# Patient Record
Sex: Female | Born: 1955 | Race: White | Hispanic: No | Marital: Married | Smoking: Never smoker
Health system: Southern US, Community
[De-identification: ages and names within clinical notes are randomized; demographics above are authoritative.]

## PROBLEM LIST (undated history)

## (undated) DIAGNOSIS — C801 Malignant (primary) neoplasm, unspecified: Secondary | ICD-10-CM

## (undated) DIAGNOSIS — M797 Fibromyalgia: Secondary | ICD-10-CM

## (undated) DIAGNOSIS — R05 Cough: Secondary | ICD-10-CM

## (undated) HISTORY — DX: Cough: R05

## (undated) HISTORY — PX: OTHER SURGICAL HISTORY: SHX169

## (undated) HISTORY — PX: BONE MARROW BIOPSY: SHX1253

## (undated) HISTORY — DX: Malignant (primary) neoplasm, unspecified: C80.1

---

## 1996-09-28 HISTORY — PX: SPINE SURGERY: SHX786

## 1999-04-24 ENCOUNTER — Encounter: Payer: Self-pay | Admitting: Emergency Medicine

## 1999-04-24 ENCOUNTER — Emergency Department (HOSPITAL_COMMUNITY): Admission: EM | Admit: 1999-04-24 | Discharge: 1999-04-24 | Payer: Self-pay | Admitting: Emergency Medicine

## 1999-09-02 ENCOUNTER — Encounter: Payer: Self-pay | Admitting: Obstetrics and Gynecology

## 1999-09-02 ENCOUNTER — Encounter: Admission: RE | Admit: 1999-09-02 | Discharge: 1999-09-02 | Payer: Self-pay | Admitting: Obstetrics and Gynecology

## 2000-04-28 ENCOUNTER — Ambulatory Visit (HOSPITAL_COMMUNITY): Admission: RE | Admit: 2000-04-28 | Discharge: 2000-04-28 | Payer: Self-pay | Admitting: Internal Medicine

## 2000-04-30 ENCOUNTER — Encounter (INDEPENDENT_AMBULATORY_CARE_PROVIDER_SITE_OTHER): Payer: Self-pay | Admitting: Specialist

## 2000-04-30 ENCOUNTER — Inpatient Hospital Stay (HOSPITAL_COMMUNITY): Admission: AD | Admit: 2000-04-30 | Discharge: 2000-05-04 | Payer: Self-pay | Admitting: Neurosurgery

## 2000-05-11 ENCOUNTER — Inpatient Hospital Stay (HOSPITAL_COMMUNITY): Admission: AD | Admit: 2000-05-11 | Discharge: 2000-05-16 | Payer: Self-pay | Admitting: Neurology

## 2005-03-16 ENCOUNTER — Ambulatory Visit (HOSPITAL_COMMUNITY): Admission: RE | Admit: 2005-03-16 | Discharge: 2005-03-16 | Payer: Self-pay | Admitting: Family Medicine

## 2005-03-19 ENCOUNTER — Ambulatory Visit (HOSPITAL_COMMUNITY): Admission: RE | Admit: 2005-03-19 | Discharge: 2005-03-19 | Payer: Self-pay | Admitting: Gastroenterology

## 2006-12-02 ENCOUNTER — Ambulatory Visit (HOSPITAL_BASED_OUTPATIENT_CLINIC_OR_DEPARTMENT_OTHER): Admission: RE | Admit: 2006-12-02 | Discharge: 2006-12-02 | Payer: Self-pay | Admitting: Orthopedic Surgery

## 2006-12-30 ENCOUNTER — Ambulatory Visit (HOSPITAL_BASED_OUTPATIENT_CLINIC_OR_DEPARTMENT_OTHER): Admission: RE | Admit: 2006-12-30 | Discharge: 2006-12-30 | Payer: Self-pay | Admitting: Orthopedic Surgery

## 2007-04-21 ENCOUNTER — Ambulatory Visit (HOSPITAL_COMMUNITY): Admission: RE | Admit: 2007-04-21 | Discharge: 2007-04-21 | Payer: Self-pay | Admitting: Family Medicine

## 2007-04-21 ENCOUNTER — Encounter (INDEPENDENT_AMBULATORY_CARE_PROVIDER_SITE_OTHER): Payer: Self-pay | Admitting: Interventional Radiology

## 2007-04-28 ENCOUNTER — Ambulatory Visit: Payer: Self-pay | Admitting: Hematology & Oncology

## 2007-05-17 ENCOUNTER — Ambulatory Visit (HOSPITAL_COMMUNITY): Admission: RE | Admit: 2007-05-17 | Discharge: 2007-05-17 | Payer: Self-pay | Admitting: Hematology & Oncology

## 2007-05-19 ENCOUNTER — Encounter: Payer: Self-pay | Admitting: Hematology & Oncology

## 2007-05-19 ENCOUNTER — Ambulatory Visit: Admission: RE | Admit: 2007-05-19 | Discharge: 2007-05-19 | Payer: Self-pay | Admitting: Hematology & Oncology

## 2007-05-20 ENCOUNTER — Ambulatory Visit (HOSPITAL_COMMUNITY): Admission: RE | Admit: 2007-05-20 | Discharge: 2007-05-20 | Payer: Self-pay | Admitting: Hematology & Oncology

## 2007-05-23 ENCOUNTER — Ambulatory Visit: Admission: RE | Admit: 2007-05-23 | Discharge: 2007-05-23 | Payer: Self-pay | Admitting: Hematology & Oncology

## 2007-05-23 LAB — CBC WITH DIFFERENTIAL/PLATELET
BASO%: 0.5 % (ref 0.0–2.0)
Eosinophils Absolute: 0.6 10*3/uL — ABNORMAL HIGH (ref 0.0–0.5)
MCHC: 33 g/dL (ref 32.0–36.0)
MONO#: 1.1 10*3/uL — ABNORMAL HIGH (ref 0.1–0.9)
MONO%: 10.6 % (ref 0.0–13.0)
NEUT#: 6.9 10*3/uL — ABNORMAL HIGH (ref 1.5–6.5)
RBC: 4.58 10*6/uL (ref 3.70–5.32)
RDW: 17.5 % — ABNORMAL HIGH (ref 11.3–14.5)
WBC: 10.8 10*3/uL — ABNORMAL HIGH (ref 3.9–10.0)

## 2007-05-23 LAB — COMPREHENSIVE METABOLIC PANEL
ALT: 11 U/L (ref 0–35)
Albumin: 3.3 g/dL — ABNORMAL LOW (ref 3.5–5.2)
Alkaline Phosphatase: 97 U/L (ref 39–117)
CO2: 24 mEq/L (ref 19–32)
Glucose, Bld: 89 mg/dL (ref 70–99)
Potassium: 4.6 mEq/L (ref 3.5–5.3)
Sodium: 140 mEq/L (ref 135–145)
Total Bilirubin: 0.3 mg/dL (ref 0.3–1.2)
Total Protein: 7.1 g/dL (ref 6.0–8.3)

## 2007-06-06 LAB — COMPREHENSIVE METABOLIC PANEL
ALT: 20 U/L (ref 0–35)
AST: 18 U/L (ref 0–37)
Albumin: 3.9 g/dL (ref 3.5–5.2)
Alkaline Phosphatase: 95 U/L (ref 39–117)
Potassium: 4.1 mEq/L (ref 3.5–5.3)
Sodium: 140 mEq/L (ref 135–145)
Total Bilirubin: 0.4 mg/dL (ref 0.3–1.2)
Total Protein: 7.2 g/dL (ref 6.0–8.3)

## 2007-06-06 LAB — CBC WITH DIFFERENTIAL/PLATELET
BASO%: 1.2 % (ref 0.0–2.0)
EOS%: 7.4 % — ABNORMAL HIGH (ref 0.0–7.0)
Eosinophils Absolute: 0.2 10*3/uL (ref 0.0–0.5)
LYMPH%: 59.1 % — ABNORMAL HIGH (ref 14.0–48.0)
MCH: 25.8 pg — ABNORMAL LOW (ref 26.0–34.0)
MCHC: 33.6 g/dL (ref 32.0–36.0)
MCV: 76.6 fL — ABNORMAL LOW (ref 81.0–101.0)
MONO%: 20.2 % — ABNORMAL HIGH (ref 0.0–13.0)
NEUT#: 0.4 10*3/uL — CL (ref 1.5–6.5)
Platelets: 391 10*3/uL (ref 145–400)
RBC: 4.58 10*6/uL (ref 3.70–5.32)
RDW: 19 % — ABNORMAL HIGH (ref 11.3–14.5)

## 2007-06-16 ENCOUNTER — Ambulatory Visit: Payer: Self-pay | Admitting: Hematology & Oncology

## 2007-06-20 LAB — CBC WITH DIFFERENTIAL/PLATELET
EOS%: 1.4 % (ref 0.0–7.0)
Eosinophils Absolute: 0.1 10*3/uL (ref 0.0–0.5)
LYMPH%: 22.2 % (ref 14.0–48.0)
MCH: 26.6 pg (ref 26.0–34.0)
MCV: 79 fL — ABNORMAL LOW (ref 81.0–101.0)
MONO%: 9.9 % (ref 0.0–13.0)
NEUT#: 6.1 10*3/uL (ref 1.5–6.5)
Platelets: 263 10*3/uL (ref 145–400)
RBC: 4.46 10*6/uL (ref 3.70–5.32)
RDW: 23.3 % — ABNORMAL HIGH (ref 11.3–14.5)

## 2007-06-20 LAB — COMPREHENSIVE METABOLIC PANEL
ALT: 17 U/L (ref 0–35)
Alkaline Phosphatase: 105 U/L (ref 39–117)
CO2: 24 mEq/L (ref 19–32)
Creatinine, Ser: 0.72 mg/dL (ref 0.40–1.20)
Glucose, Bld: 63 mg/dL — ABNORMAL LOW (ref 70–99)
Sodium: 142 mEq/L (ref 135–145)
Total Bilirubin: 0.3 mg/dL (ref 0.3–1.2)
Total Protein: 7.2 g/dL (ref 6.0–8.3)

## 2007-07-04 LAB — CBC WITH DIFFERENTIAL/PLATELET
Eosinophils Absolute: 0.2 10*3/uL (ref 0.0–0.5)
HCT: 34.9 % (ref 34.8–46.6)
LYMPH%: 17.3 % (ref 14.0–48.0)
MCV: 80.3 fL — ABNORMAL LOW (ref 81.0–101.0)
MONO#: 1 10*3/uL — ABNORMAL HIGH (ref 0.1–0.9)
MONO%: 9.6 % (ref 0.0–13.0)
NEUT#: 7.7 10*3/uL — ABNORMAL HIGH (ref 1.5–6.5)
NEUT%: 71 % (ref 39.6–76.8)
Platelets: 310 10*3/uL (ref 145–400)
RBC: 4.35 10*6/uL (ref 3.70–5.32)
WBC: 10.8 10*3/uL — ABNORMAL HIGH (ref 3.9–10.0)

## 2007-07-04 LAB — LACTATE DEHYDROGENASE: LDH: 203 U/L (ref 94–250)

## 2007-07-04 LAB — COMPREHENSIVE METABOLIC PANEL
Alkaline Phosphatase: 117 U/L (ref 39–117)
BUN: 16 mg/dL (ref 6–23)
CO2: 23 mEq/L (ref 19–32)
Creatinine, Ser: 0.76 mg/dL (ref 0.40–1.20)
Glucose, Bld: 83 mg/dL (ref 70–99)
Total Bilirubin: 0.3 mg/dL (ref 0.3–1.2)

## 2007-07-18 LAB — CBC WITH DIFFERENTIAL/PLATELET
BASO%: 0.7 % (ref 0.0–2.0)
Basophils Absolute: 0.1 10*3/uL (ref 0.0–0.1)
Eosinophils Absolute: 0.2 10*3/uL (ref 0.0–0.5)
HCT: 35.7 % (ref 34.8–46.6)
HGB: 11.9 g/dL (ref 11.6–15.9)
MCHC: 33.5 g/dL (ref 32.0–36.0)
MONO#: 1.3 10*3/uL — ABNORMAL HIGH (ref 0.1–0.9)
NEUT#: 9.2 10*3/uL — ABNORMAL HIGH (ref 1.5–6.5)
NEUT%: 72.1 % (ref 39.6–76.8)
Platelets: 334 10*3/uL (ref 145–400)
WBC: 12.7 10*3/uL — ABNORMAL HIGH (ref 3.9–10.0)
lymph#: 2 10*3/uL (ref 0.9–3.3)

## 2007-07-18 LAB — COMPREHENSIVE METABOLIC PANEL
Alkaline Phosphatase: 115 U/L (ref 39–117)
BUN: 20 mg/dL (ref 6–23)
CO2: 22 mEq/L (ref 19–32)
Creatinine, Ser: 0.75 mg/dL (ref 0.40–1.20)
Glucose, Bld: 74 mg/dL (ref 70–99)
Total Bilirubin: 0.3 mg/dL (ref 0.3–1.2)

## 2007-07-28 ENCOUNTER — Ambulatory Visit: Payer: Self-pay | Admitting: Hematology & Oncology

## 2007-08-01 LAB — CBC WITH DIFFERENTIAL/PLATELET
Basophils Absolute: 0.1 10*3/uL (ref 0.0–0.1)
Eosinophils Absolute: 0.1 10*3/uL (ref 0.0–0.5)
HGB: 11.8 g/dL (ref 11.6–15.9)
LYMPH%: 18.4 % (ref 14.0–48.0)
MCV: 85.9 fL (ref 81.0–101.0)
MONO#: 1.2 10*3/uL — ABNORMAL HIGH (ref 0.1–0.9)
MONO%: 11.7 % (ref 0.0–13.0)
NEUT#: 6.9 10*3/uL — ABNORMAL HIGH (ref 1.5–6.5)
Platelets: 312 10*3/uL (ref 145–400)
RBC: 4 10*6/uL (ref 3.70–5.32)
WBC: 10.2 10*3/uL — ABNORMAL HIGH (ref 3.9–10.0)

## 2007-08-15 LAB — COMPREHENSIVE METABOLIC PANEL
Alkaline Phosphatase: 122 U/L — ABNORMAL HIGH (ref 39–117)
BUN: 21 mg/dL (ref 6–23)
CO2: 22 mEq/L (ref 19–32)
Creatinine, Ser: 0.74 mg/dL (ref 0.40–1.20)
Glucose, Bld: 82 mg/dL (ref 70–99)
Total Bilirubin: 0.3 mg/dL (ref 0.3–1.2)

## 2007-08-15 LAB — CBC WITH DIFFERENTIAL/PLATELET
Eosinophils Absolute: 0.2 10*3/uL (ref 0.0–0.5)
HCT: 35.8 % (ref 34.8–46.6)
LYMPH%: 17.1 % (ref 14.0–48.0)
MCHC: 34.5 g/dL (ref 32.0–36.0)
MCV: 89.5 fL (ref 81.0–101.0)
MONO#: 1.2 10*3/uL — ABNORMAL HIGH (ref 0.1–0.9)
MONO%: 9.9 % (ref 0.0–13.0)
NEUT#: 9 10*3/uL — ABNORMAL HIGH (ref 1.5–6.5)
NEUT%: 71.3 % (ref 39.6–76.8)
Platelets: 339 10*3/uL (ref 145–400)
RBC: 3.99 10*6/uL (ref 3.70–5.32)
WBC: 12.6 10*3/uL — ABNORMAL HIGH (ref 3.9–10.0)

## 2007-08-15 LAB — LACTATE DEHYDROGENASE: LDH: 198 U/L (ref 94–250)

## 2007-08-29 LAB — COMPREHENSIVE METABOLIC PANEL
ALT: 18 U/L (ref 0–35)
AST: 13 U/L (ref 0–37)
Chloride: 108 mEq/L (ref 96–112)
Creatinine, Ser: 0.89 mg/dL (ref 0.40–1.20)
Total Bilirubin: 0.3 mg/dL (ref 0.3–1.2)

## 2007-08-29 LAB — CBC WITH DIFFERENTIAL/PLATELET
BASO%: 0.3 % (ref 0.0–2.0)
Basophils Absolute: 0.1 10*3/uL (ref 0.0–0.1)
Eosinophils Absolute: 0.1 10*3/uL (ref 0.0–0.5)
HCT: 35.7 % (ref 34.8–46.6)
HGB: 12.4 g/dL (ref 11.6–15.9)
MCHC: 34.7 g/dL (ref 32.0–36.0)
MONO#: 1.1 10*3/uL — ABNORMAL HIGH (ref 0.1–0.9)
NEUT#: 14.4 10*3/uL — ABNORMAL HIGH (ref 1.5–6.5)
NEUT%: 81.4 % — ABNORMAL HIGH (ref 39.6–76.8)
Platelets: 302 10*3/uL (ref 145–400)
WBC: 17.7 10*3/uL — ABNORMAL HIGH (ref 3.9–10.0)
lymph#: 2.1 10*3/uL (ref 0.9–3.3)

## 2007-09-02 ENCOUNTER — Ambulatory Visit (HOSPITAL_COMMUNITY): Admission: RE | Admit: 2007-09-02 | Discharge: 2007-09-02 | Payer: Self-pay | Admitting: Hematology & Oncology

## 2007-09-06 ENCOUNTER — Ambulatory Visit: Payer: Self-pay | Admitting: Hematology & Oncology

## 2007-09-12 LAB — PROTIME-INR: Protime: 21.6 Seconds — ABNORMAL HIGH (ref 10.6–13.4)

## 2007-09-12 LAB — CBC WITH DIFFERENTIAL/PLATELET
Eosinophils Absolute: 0.1 10*3/uL (ref 0.0–0.5)
HCT: 30.5 % — ABNORMAL LOW (ref 34.8–46.6)
HGB: 10.7 g/dL — ABNORMAL LOW (ref 11.6–15.9)
LYMPH%: 36.4 % (ref 14.0–48.0)
MONO#: 1 10*3/uL — ABNORMAL HIGH (ref 0.1–0.9)
NEUT#: 0.9 10*3/uL — ABNORMAL LOW (ref 1.5–6.5)
NEUT%: 27.4 % — ABNORMAL LOW (ref 39.6–76.8)
Platelets: 463 10*3/uL — ABNORMAL HIGH (ref 145–400)
WBC: 3.3 10*3/uL — ABNORMAL LOW (ref 3.9–10.0)

## 2007-09-12 LAB — COMPREHENSIVE METABOLIC PANEL
Albumin: 3.9 g/dL (ref 3.5–5.2)
Alkaline Phosphatase: 83 U/L (ref 39–117)
CO2: 24 mEq/L (ref 19–32)
Glucose, Bld: 93 mg/dL (ref 70–99)
Potassium: 4.4 mEq/L (ref 3.5–5.3)
Sodium: 140 mEq/L (ref 135–145)
Total Protein: 6.9 g/dL (ref 6.0–8.3)

## 2007-09-12 LAB — LACTATE DEHYDROGENASE: LDH: 186 U/L (ref 94–250)

## 2007-09-19 LAB — PROTIME-INR: INR: 3.9 — ABNORMAL HIGH (ref 2.00–3.50)

## 2007-09-26 LAB — CBC WITH DIFFERENTIAL/PLATELET
BASO%: 0.6 % (ref 0.0–2.0)
EOS%: 2.2 % (ref 0.0–7.0)
HCT: 33.5 % — ABNORMAL LOW (ref 34.8–46.6)
LYMPH%: 17 % (ref 14.0–48.0)
MCH: 31.1 pg (ref 26.0–34.0)
MCHC: 34 g/dL (ref 32.0–36.0)
MCV: 91.7 fL (ref 81.0–101.0)
MONO#: 1.1 10*3/uL — ABNORMAL HIGH (ref 0.1–0.9)
MONO%: 12.4 % (ref 0.0–13.0)
NEUT%: 67.8 % (ref 39.6–76.8)
Platelets: 292 10*3/uL (ref 145–400)
RBC: 3.65 10*6/uL — ABNORMAL LOW (ref 3.70–5.32)
WBC: 8.9 10*3/uL (ref 3.9–10.0)

## 2007-09-26 LAB — PROTIME-INR: Protime: 15.6 Seconds — ABNORMAL HIGH (ref 10.6–13.4)

## 2007-10-03 ENCOUNTER — Ambulatory Visit (HOSPITAL_COMMUNITY): Admission: RE | Admit: 2007-10-03 | Discharge: 2007-10-03 | Payer: Self-pay | Admitting: Hematology & Oncology

## 2007-10-03 LAB — PROTIME-INR: Protime: 15.6 Seconds — ABNORMAL HIGH (ref 10.6–13.4)

## 2007-10-10 LAB — COMPREHENSIVE METABOLIC PANEL
ALT: 13 U/L (ref 0–35)
CO2: 21 mEq/L (ref 19–32)
Calcium: 9.5 mg/dL (ref 8.4–10.5)
Chloride: 108 mEq/L (ref 96–112)
Creatinine, Ser: 0.89 mg/dL (ref 0.40–1.20)
Glucose, Bld: 108 mg/dL — ABNORMAL HIGH (ref 70–99)
Total Bilirubin: 0.3 mg/dL (ref 0.3–1.2)
Total Protein: 7.2 g/dL (ref 6.0–8.3)

## 2007-10-10 LAB — CBC WITH DIFFERENTIAL/PLATELET
BASO%: 2.1 % — ABNORMAL HIGH (ref 0.0–2.0)
HCT: 35.2 % (ref 34.8–46.6)
LYMPH%: 41.1 % (ref 14.0–48.0)
MCHC: 34.1 g/dL (ref 32.0–36.0)
MCV: 92.5 fL (ref 81.0–101.0)
MONO#: 1.1 10*3/uL — ABNORMAL HIGH (ref 0.1–0.9)
MONO%: 32.8 % — ABNORMAL HIGH (ref 0.0–13.0)
NEUT%: 18.5 % — ABNORMAL LOW (ref 39.6–76.8)
Platelets: 509 10*3/uL — ABNORMAL HIGH (ref 145–400)
RBC: 3.8 10*6/uL (ref 3.70–5.32)

## 2007-10-10 LAB — PROTIME-INR

## 2007-10-14 ENCOUNTER — Ambulatory Visit: Admission: RE | Admit: 2007-10-14 | Discharge: 2007-10-14 | Payer: Self-pay | Admitting: Hematology & Oncology

## 2007-10-17 LAB — CBC WITH DIFFERENTIAL/PLATELET
BASO%: 0.5 % (ref 0.0–2.0)
Eosinophils Absolute: 0.4 10*3/uL (ref 0.0–0.5)
MCHC: 33.3 g/dL (ref 32.0–36.0)
MONO#: 1.9 10*3/uL — ABNORMAL HIGH (ref 0.1–0.9)
NEUT#: 8.7 10*3/uL — ABNORMAL HIGH (ref 1.5–6.5)
RBC: 4.2 10*6/uL (ref 3.70–5.32)
WBC: 13.1 10*3/uL — ABNORMAL HIGH (ref 3.9–10.0)
lymph#: 2 10*3/uL (ref 0.9–3.3)

## 2007-10-17 LAB — PROTIME-INR
INR: 1.9 — ABNORMAL LOW (ref 2.00–3.50)
Protime: 22.8 Seconds — ABNORMAL HIGH (ref 10.6–13.4)

## 2007-10-17 LAB — COMPREHENSIVE METABOLIC PANEL
ALT: 17 U/L (ref 0–35)
AST: 15 U/L (ref 0–37)
Alkaline Phosphatase: 82 U/L (ref 39–117)
Potassium: 4.5 mEq/L (ref 3.5–5.3)
Sodium: 140 mEq/L (ref 135–145)
Total Bilirubin: 0.4 mg/dL (ref 0.3–1.2)
Total Protein: 7.2 g/dL (ref 6.0–8.3)

## 2007-10-24 ENCOUNTER — Ambulatory Visit: Payer: Self-pay | Admitting: Hematology & Oncology

## 2007-10-24 LAB — PROTIME-INR: INR: 3.9 — ABNORMAL HIGH (ref 2.00–3.50)

## 2007-10-31 LAB — CBC WITH DIFFERENTIAL/PLATELET
BASO%: 2.1 % — ABNORMAL HIGH (ref 0.0–2.0)
EOS%: 16.4 % — ABNORMAL HIGH (ref 0.0–7.0)
HCT: 33.1 % — ABNORMAL LOW (ref 34.8–46.6)
LYMPH%: 40.1 % (ref 14.0–48.0)
MCH: 30.2 pg (ref 26.0–34.0)
MCHC: 33.7 g/dL (ref 32.0–36.0)
MCV: 89.5 fL (ref 81.0–101.0)
MONO%: 22.9 % — ABNORMAL HIGH (ref 0.0–13.0)
NEUT%: 18.5 % — ABNORMAL LOW (ref 39.6–76.8)
Platelets: 304 10*3/uL (ref 145–400)
RBC: 3.7 10*6/uL (ref 3.70–5.32)

## 2007-10-31 LAB — COMPREHENSIVE METABOLIC PANEL
ALT: 13 U/L (ref 0–35)
AST: 13 U/L (ref 0–37)
Alkaline Phosphatase: 80 U/L (ref 39–117)
CO2: 23 mEq/L (ref 19–32)
Creatinine, Ser: 0.73 mg/dL (ref 0.40–1.20)
Sodium: 143 mEq/L (ref 135–145)
Total Bilirubin: 0.3 mg/dL (ref 0.3–1.2)

## 2007-11-15 ENCOUNTER — Ambulatory Visit (HOSPITAL_COMMUNITY): Admission: RE | Admit: 2007-11-15 | Discharge: 2007-11-15 | Payer: Self-pay | Admitting: Hematology & Oncology

## 2007-11-21 LAB — COMPREHENSIVE METABOLIC PANEL
Albumin: 4.2 g/dL (ref 3.5–5.2)
CO2: 23 mEq/L (ref 19–32)
Glucose, Bld: 95 mg/dL (ref 70–99)
Potassium: 4 mEq/L (ref 3.5–5.3)
Sodium: 138 mEq/L (ref 135–145)
Total Protein: 7.3 g/dL (ref 6.0–8.3)

## 2007-11-21 LAB — CBC WITH DIFFERENTIAL/PLATELET
BASO%: 0.4 % (ref 0.0–2.0)
HCT: 35.1 % (ref 34.8–46.6)
LYMPH%: 21.1 % (ref 14.0–48.0)
MCHC: 34.3 g/dL (ref 32.0–36.0)
MCV: 87.2 fL (ref 81.0–101.0)
MONO#: 1.8 10*3/uL — ABNORMAL HIGH (ref 0.1–0.9)
MONO%: 17 % — ABNORMAL HIGH (ref 0.0–13.0)
NEUT%: 59 % (ref 39.6–76.8)
Platelets: 418 10*3/uL — ABNORMAL HIGH (ref 145–400)
WBC: 10.4 10*3/uL — ABNORMAL HIGH (ref 3.9–10.0)

## 2007-11-25 ENCOUNTER — Ambulatory Visit: Admission: RE | Admit: 2007-11-25 | Discharge: 2008-01-02 | Payer: Self-pay | Admitting: Radiation Oncology

## 2007-12-05 LAB — PROTIME-INR

## 2007-12-19 ENCOUNTER — Ambulatory Visit: Payer: Self-pay | Admitting: Hematology & Oncology

## 2007-12-22 ENCOUNTER — Ambulatory Visit: Payer: Self-pay | Admitting: Oncology

## 2007-12-22 ENCOUNTER — Ambulatory Visit (HOSPITAL_COMMUNITY): Admission: RE | Admit: 2007-12-22 | Discharge: 2007-12-22 | Payer: Self-pay | Admitting: Radiation Oncology

## 2007-12-22 ENCOUNTER — Inpatient Hospital Stay (HOSPITAL_COMMUNITY): Admission: AD | Admit: 2007-12-22 | Discharge: 2007-12-27 | Payer: Self-pay | Admitting: Hematology & Oncology

## 2007-12-30 LAB — CBC WITH DIFFERENTIAL/PLATELET
BASO%: 0 % (ref 0.0–2.0)
Eosinophils Absolute: 0 10*3/uL (ref 0.0–0.5)
HCT: 33.8 % — ABNORMAL LOW (ref 34.8–46.6)
HGB: 11.6 g/dL (ref 11.6–15.9)
MCHC: 34.4 g/dL (ref 32.0–36.0)
MONO#: 0.1 10*3/uL (ref 0.1–0.9)
NEUT#: 8.1 10*3/uL — ABNORMAL HIGH (ref 1.5–6.5)
NEUT%: 95.5 % — ABNORMAL HIGH (ref 39.6–76.8)
WBC: 8.5 10*3/uL (ref 3.9–10.0)
lymph#: 0.2 10*3/uL — ABNORMAL LOW (ref 0.9–3.3)

## 2007-12-30 LAB — COMPREHENSIVE METABOLIC PANEL
ALT: 14 U/L (ref 0–35)
CO2: 24 mEq/L (ref 19–32)
Calcium: 9 mg/dL (ref 8.4–10.5)
Chloride: 104 mEq/L (ref 96–112)
Creatinine, Ser: 0.72 mg/dL (ref 0.40–1.20)
Total Protein: 6.8 g/dL (ref 6.0–8.3)

## 2007-12-30 LAB — LACTATE DEHYDROGENASE: LDH: 186 U/L (ref 94–250)

## 2008-02-29 ENCOUNTER — Ambulatory Visit (HOSPITAL_COMMUNITY): Admission: RE | Admit: 2008-02-29 | Discharge: 2008-02-29 | Payer: Self-pay | Admitting: Hematology & Oncology

## 2008-03-05 ENCOUNTER — Ambulatory Visit: Payer: Self-pay | Admitting: Hematology & Oncology

## 2008-03-07 LAB — COMPREHENSIVE METABOLIC PANEL
AST: 15 U/L (ref 0–37)
Albumin: 4.1 g/dL (ref 3.5–5.2)
Alkaline Phosphatase: 81 U/L (ref 39–117)
BUN: 17 mg/dL (ref 6–23)
Glucose, Bld: 93 mg/dL (ref 70–99)
Potassium: 4.8 mEq/L (ref 3.5–5.3)
Sodium: 140 mEq/L (ref 135–145)
Total Bilirubin: 0.4 mg/dL (ref 0.3–1.2)

## 2008-03-07 LAB — CBC WITH DIFFERENTIAL/PLATELET
EOS%: 2.9 % (ref 0.0–7.0)
Eosinophils Absolute: 0.2 10*3/uL (ref 0.0–0.5)
LYMPH%: 14.7 % (ref 14.0–48.0)
MCH: 29.6 pg (ref 26.0–34.0)
MCV: 86.7 fL (ref 81.0–101.0)
MONO%: 8.8 % (ref 0.0–13.0)
NEUT#: 5.1 10*3/uL (ref 1.5–6.5)
Platelets: 313 10*3/uL (ref 145–400)
RBC: 4.75 10*6/uL (ref 3.70–5.32)
RDW: 16.1 % — ABNORMAL HIGH (ref 11.3–14.5)

## 2008-03-07 LAB — LACTATE DEHYDROGENASE: LDH: 165 U/L (ref 94–250)

## 2008-05-02 ENCOUNTER — Ambulatory Visit (HOSPITAL_COMMUNITY): Admission: RE | Admit: 2008-05-02 | Discharge: 2008-05-02 | Payer: Self-pay | Admitting: Hematology & Oncology

## 2008-05-09 ENCOUNTER — Ambulatory Visit: Payer: Self-pay | Admitting: Hematology & Oncology

## 2008-06-21 ENCOUNTER — Ambulatory Visit: Payer: Self-pay | Admitting: Hematology & Oncology

## 2008-07-31 ENCOUNTER — Ambulatory Visit (HOSPITAL_COMMUNITY): Admission: RE | Admit: 2008-07-31 | Discharge: 2008-07-31 | Payer: Self-pay | Admitting: Hematology & Oncology

## 2008-08-07 ENCOUNTER — Ambulatory Visit: Payer: Self-pay | Admitting: Hematology & Oncology

## 2008-08-08 LAB — CBC WITH DIFFERENTIAL (CANCER CENTER ONLY)
Eosinophils Absolute: 0.3 10*3/uL (ref 0.0–0.5)
HCT: 38.3 % (ref 34.8–46.6)
LYMPH%: 21.7 % (ref 14.0–48.0)
MCH: 29 pg (ref 26.0–34.0)
MCV: 87 fL (ref 81–101)
MONO#: 0.6 10*3/uL (ref 0.1–0.9)
NEUT%: 62.8 % (ref 39.6–80.0)
RDW: 12.9 % (ref 10.5–14.6)
WBC: 6.3 10*3/uL (ref 3.9–10.0)

## 2008-08-08 LAB — COMPREHENSIVE METABOLIC PANEL
ALT: 13 U/L (ref 0–35)
AST: 13 U/L (ref 0–37)
Albumin: 3.9 g/dL (ref 3.5–5.2)
Alkaline Phosphatase: 113 U/L (ref 39–117)
Calcium: 9.3 mg/dL (ref 8.4–10.5)
Chloride: 106 mEq/L (ref 96–112)
Potassium: 4.6 mEq/L (ref 3.5–5.3)
Sodium: 142 mEq/L (ref 135–145)
Total Protein: 7.4 g/dL (ref 6.0–8.3)

## 2008-08-13 ENCOUNTER — Ambulatory Visit: Payer: Self-pay | Admitting: Thoracic Surgery (Cardiothoracic Vascular Surgery)

## 2008-09-04 ENCOUNTER — Ambulatory Visit (HOSPITAL_COMMUNITY)
Admission: RE | Admit: 2008-09-04 | Discharge: 2008-09-04 | Payer: Self-pay | Admitting: Thoracic Surgery (Cardiothoracic Vascular Surgery)

## 2008-09-04 ENCOUNTER — Ambulatory Visit: Payer: Self-pay | Admitting: Thoracic Surgery (Cardiothoracic Vascular Surgery)

## 2008-09-04 ENCOUNTER — Encounter: Payer: Self-pay | Admitting: Thoracic Surgery (Cardiothoracic Vascular Surgery)

## 2008-09-10 ENCOUNTER — Ambulatory Visit: Payer: Self-pay | Admitting: Thoracic Surgery (Cardiothoracic Vascular Surgery)

## 2008-09-13 LAB — CBC WITH DIFFERENTIAL (CANCER CENTER ONLY)
BASO#: 0 10*3/uL (ref 0.0–0.2)
EOS%: 5.6 % (ref 0.0–7.0)
HGB: 12.1 g/dL (ref 11.6–15.9)
MCH: 29 pg (ref 26.0–34.0)
MCHC: 33.8 g/dL (ref 32.0–36.0)
MONO%: 10.8 % (ref 0.0–13.0)
NEUT#: 2.9 10*3/uL (ref 1.5–6.5)
NEUT%: 57.1 % (ref 39.6–80.0)

## 2008-09-14 LAB — COMPREHENSIVE METABOLIC PANEL
AST: 11 U/L (ref 0–37)
Alkaline Phosphatase: 101 U/L (ref 39–117)
BUN: 23 mg/dL (ref 6–23)
Creatinine, Ser: 0.72 mg/dL (ref 0.40–1.20)
Glucose, Bld: 101 mg/dL — ABNORMAL HIGH (ref 70–99)
Total Bilirubin: 0.3 mg/dL (ref 0.3–1.2)

## 2008-09-14 LAB — LACTATE DEHYDROGENASE: LDH: 197 U/L (ref 94–250)

## 2008-10-16 ENCOUNTER — Ambulatory Visit: Payer: Self-pay | Admitting: Hematology & Oncology

## 2008-10-16 ENCOUNTER — Inpatient Hospital Stay (HOSPITAL_COMMUNITY): Admission: AD | Admit: 2008-10-16 | Discharge: 2008-10-20 | Payer: Self-pay | Admitting: Hematology & Oncology

## 2008-10-20 ENCOUNTER — Ambulatory Visit: Payer: Self-pay | Admitting: Hematology and Oncology

## 2008-10-22 ENCOUNTER — Ambulatory Visit: Payer: Self-pay | Admitting: Hematology & Oncology

## 2008-10-29 LAB — CBC WITH DIFFERENTIAL (CANCER CENTER ONLY)
HCT: 32.1 % — ABNORMAL LOW (ref 34.8–46.6)
HGB: 10.7 g/dL — ABNORMAL LOW (ref 11.6–15.9)
MCV: 84 fL (ref 81–101)
Platelets: 53 10*3/uL — ABNORMAL LOW (ref 145–400)
RBC: 3.83 10*6/uL (ref 3.70–5.32)
WBC: 7.4 10*3/uL (ref 3.9–10.0)

## 2008-10-29 LAB — CMP (CANCER CENTER ONLY)
CO2: 28 mEq/L (ref 18–33)
Calcium: 9.6 mg/dL (ref 8.0–10.3)
Chloride: 102 mEq/L (ref 98–108)
Glucose, Bld: 107 mg/dL (ref 73–118)
Sodium: 145 mEq/L (ref 128–145)
Total Bilirubin: 0.4 mg/dl (ref 0.20–1.60)
Total Protein: 7.7 g/dL (ref 6.4–8.1)

## 2008-10-29 LAB — MANUAL DIFFERENTIAL (CHCC SATELLITE)
ALC: 1.3 10*3/uL (ref 0.6–2.2)
ANC (CHCC HP manual diff): 5.4 10*3/uL (ref 1.5–6.7)
LYMPH: 18 % (ref 14–48)

## 2008-10-29 LAB — LACTATE DEHYDROGENASE: LDH: 196 U/L (ref 94–250)

## 2008-11-12 ENCOUNTER — Ambulatory Visit (HOSPITAL_COMMUNITY): Admission: RE | Admit: 2008-11-12 | Discharge: 2008-11-12 | Payer: Self-pay | Admitting: Hematology & Oncology

## 2008-11-19 LAB — CBC WITH DIFFERENTIAL (CANCER CENTER ONLY)
BASO#: 0.1 10*3/uL (ref 0.0–0.2)
EOS%: 0.8 % (ref 0.0–7.0)
HCT: 31.7 % — ABNORMAL LOW (ref 34.8–46.6)
HGB: 10.5 g/dL — ABNORMAL LOW (ref 11.6–15.9)
LYMPH#: 1.1 10*3/uL (ref 0.9–3.3)
LYMPH%: 11.1 % — ABNORMAL LOW (ref 14.0–48.0)
MCH: 28.4 pg (ref 26.0–34.0)
MCHC: 33 g/dL (ref 32.0–36.0)
MCV: 86 fL (ref 81–101)
NEUT%: 71.2 % (ref 39.6–80.0)

## 2008-11-19 LAB — CMP (CANCER CENTER ONLY)
ALT(SGPT): 16 U/L (ref 10–47)
AST: 22 U/L (ref 11–38)
Albumin: 3.5 g/dL (ref 3.3–5.5)
Alkaline Phosphatase: 101 U/L — ABNORMAL HIGH (ref 26–84)
BUN, Bld: 18 mg/dL (ref 7–22)
Calcium: 8.9 mg/dL (ref 8.0–10.3)
Chloride: 105 mEq/L (ref 98–108)
Potassium: 4.4 mEq/L (ref 3.3–4.7)
Sodium: 140 mEq/L (ref 128–145)
Total Protein: 7.9 g/dL (ref 6.4–8.1)

## 2008-12-06 ENCOUNTER — Ambulatory Visit: Admission: RE | Admit: 2008-12-06 | Discharge: 2008-12-06 | Payer: Self-pay | Admitting: Hematology & Oncology

## 2008-12-07 ENCOUNTER — Ambulatory Visit: Payer: Self-pay | Admitting: Hematology & Oncology

## 2008-12-10 ENCOUNTER — Encounter: Payer: Self-pay | Admitting: Hematology & Oncology

## 2008-12-10 ENCOUNTER — Ambulatory Visit (HOSPITAL_COMMUNITY): Admission: RE | Admit: 2008-12-10 | Discharge: 2008-12-10 | Payer: Self-pay | Admitting: Hematology & Oncology

## 2008-12-10 ENCOUNTER — Ambulatory Visit: Payer: Self-pay | Admitting: Hematology & Oncology

## 2008-12-12 LAB — CBC WITH DIFFERENTIAL (CANCER CENTER ONLY)
BASO#: 0 10*3/uL (ref 0.0–0.2)
Eosinophils Absolute: 0.1 10*3/uL (ref 0.0–0.5)
HGB: 10.2 g/dL — ABNORMAL LOW (ref 11.6–15.9)
LYMPH#: 0.5 10*3/uL — ABNORMAL LOW (ref 0.9–3.3)
MONO#: 0.7 10*3/uL (ref 0.1–0.9)
NEUT#: 2.5 10*3/uL (ref 1.5–6.5)
Platelets: 74 10*3/uL — ABNORMAL LOW (ref 145–400)
RBC: 3.49 10*6/uL — ABNORMAL LOW (ref 3.70–5.32)
WBC: 3.8 10*3/uL — ABNORMAL LOW (ref 3.9–10.0)

## 2008-12-12 LAB — COMPREHENSIVE METABOLIC PANEL
ALT: 17 U/L (ref 0–35)
AST: 17 U/L (ref 0–37)
Alkaline Phosphatase: 109 U/L (ref 39–117)
Glucose, Bld: 77 mg/dL (ref 70–99)
Potassium: 4.1 mEq/L (ref 3.5–5.3)
Sodium: 145 mEq/L (ref 135–145)
Total Bilirubin: 0.3 mg/dL (ref 0.3–1.2)
Total Protein: 7.1 g/dL (ref 6.0–8.3)

## 2008-12-31 LAB — COMPREHENSIVE METABOLIC PANEL
ALT: 16 U/L (ref 0–35)
AST: 17 U/L (ref 0–37)
CO2: 22 mEq/L (ref 19–32)
Calcium: 8.8 mg/dL (ref 8.4–10.5)
Chloride: 109 mEq/L (ref 96–112)
Creatinine, Ser: 0.69 mg/dL (ref 0.40–1.20)
Potassium: 4.2 mEq/L (ref 3.5–5.3)
Sodium: 142 mEq/L (ref 135–145)
Total Protein: 7.1 g/dL (ref 6.0–8.3)

## 2008-12-31 LAB — CBC WITH DIFFERENTIAL (CANCER CENTER ONLY)
EOS%: 9.4 % — ABNORMAL HIGH (ref 0.0–7.0)
LYMPH#: 0.8 10*3/uL — ABNORMAL LOW (ref 0.9–3.3)
MCH: 31.8 pg (ref 26.0–34.0)
MCHC: 32.7 g/dL (ref 32.0–36.0)
MONO%: 12.2 % (ref 0.0–13.0)
NEUT#: 1.3 10*3/uL — ABNORMAL LOW (ref 1.5–6.5)
Platelets: 100 10*3/uL — ABNORMAL LOW (ref 145–400)
RBC: 3.44 10*6/uL — ABNORMAL LOW (ref 3.70–5.32)

## 2009-01-07 ENCOUNTER — Ambulatory Visit: Payer: Self-pay | Admitting: Hematology

## 2009-01-07 LAB — COMPREHENSIVE METABOLIC PANEL
ALT: 11 U/L (ref 0–35)
AST: 11 U/L (ref 0–37)
Alkaline Phosphatase: 143 U/L — ABNORMAL HIGH (ref 39–117)
BUN: 24 mg/dL — ABNORMAL HIGH (ref 6–23)
Creatinine, Ser: 0.89 mg/dL (ref 0.40–1.20)
Potassium: 4.3 mEq/L (ref 3.5–5.3)

## 2009-01-07 LAB — CBC WITH DIFFERENTIAL/PLATELET
BASO%: 1.4 % (ref 0.0–2.0)
EOS%: 20 % — ABNORMAL HIGH (ref 0.0–7.0)
HCT: 29.4 % — ABNORMAL LOW (ref 34.8–46.6)
LYMPH%: 37.1 % (ref 14.0–49.7)
MCH: 32.3 pg (ref 25.1–34.0)
MCHC: 33.3 g/dL (ref 31.5–36.0)
NEUT%: 35.8 % — ABNORMAL LOW (ref 38.4–76.8)
RBC: 3.03 10*6/uL — ABNORMAL LOW (ref 3.70–5.45)
WBC: 0.7 10*3/uL — CL (ref 3.9–10.3)
lymph#: 0.3 10*3/uL — ABNORMAL LOW (ref 0.9–3.3)
nRBC: 0 % (ref 0–0)

## 2009-01-09 LAB — COMPREHENSIVE METABOLIC PANEL
AST: 10 U/L (ref 0–37)
Albumin: 4.1 g/dL (ref 3.5–5.2)
BUN: 17 mg/dL (ref 6–23)
CO2: 21 mEq/L (ref 19–32)
Calcium: 8.6 mg/dL (ref 8.4–10.5)
Chloride: 110 mEq/L (ref 96–112)
Potassium: 3.9 mEq/L (ref 3.5–5.3)

## 2009-01-09 LAB — CBC WITH DIFFERENTIAL/PLATELET
BASO%: 2.7 % — ABNORMAL HIGH (ref 0.0–2.0)
Basophils Absolute: 0 10*3/uL (ref 0.0–0.1)
EOS%: 10.8 % — ABNORMAL HIGH (ref 0.0–7.0)
HGB: 9.2 g/dL — ABNORMAL LOW (ref 11.6–15.9)
MCH: 34.5 pg — ABNORMAL HIGH (ref 25.1–34.0)
MCHC: 35.3 g/dL (ref 31.5–36.0)
RDW: 20.9 % — ABNORMAL HIGH (ref 11.2–14.5)
WBC: 0.4 10*3/uL — CL (ref 3.9–10.3)
lymph#: 0.3 10*3/uL — ABNORMAL LOW (ref 0.9–3.3)

## 2009-01-11 ENCOUNTER — Encounter (HOSPITAL_COMMUNITY): Admission: RE | Admit: 2009-01-11 | Discharge: 2009-01-31 | Payer: Self-pay | Admitting: Hematology & Oncology

## 2009-01-11 LAB — CBC WITH DIFFERENTIAL/PLATELET
BASO%: 2.1 % — ABNORMAL HIGH (ref 0.0–2.0)
EOS%: 8.5 % — ABNORMAL HIGH (ref 0.0–7.0)
MCH: 32.7 pg (ref 25.1–34.0)
MCHC: 34.7 g/dL (ref 31.5–36.0)
NEUT%: 19.2 % — ABNORMAL LOW (ref 38.4–76.8)
RDW: 17.2 % — ABNORMAL HIGH (ref 11.2–14.5)
lymph#: 0.3 10*3/uL — ABNORMAL LOW (ref 0.9–3.3)

## 2009-01-11 LAB — COMPREHENSIVE METABOLIC PANEL
ALT: 11 U/L (ref 0–35)
AST: 12 U/L (ref 0–37)
Albumin: 3.8 g/dL (ref 3.5–5.2)
Calcium: 8.6 mg/dL (ref 8.4–10.5)
Chloride: 109 mEq/L (ref 96–112)
Potassium: 3.9 mEq/L (ref 3.5–5.3)
Total Protein: 6.7 g/dL (ref 6.0–8.3)

## 2009-01-11 LAB — TYPE & CROSSMATCH - CHCC

## 2009-01-14 LAB — CBC WITH DIFFERENTIAL/PLATELET
BASO%: 0.3 % (ref 0.0–2.0)
LYMPH%: 25.7 % (ref 14.0–49.7)
MCHC: 35 g/dL (ref 31.5–36.0)
MONO#: 0.1 10*3/uL (ref 0.1–0.9)
Platelets: 15 10*3/uL — ABNORMAL LOW (ref 145–400)
RBC: 3.32 10*6/uL — ABNORMAL LOW (ref 3.70–5.45)
RDW: 21.5 % — ABNORMAL HIGH (ref 11.2–14.5)
WBC: 1.1 10*3/uL — ABNORMAL LOW (ref 3.9–10.3)
lymph#: 0.3 10*3/uL — ABNORMAL LOW (ref 0.9–3.3)

## 2009-01-14 LAB — COMPREHENSIVE METABOLIC PANEL WITH GFR
ALT: 10 U/L (ref 0–35)
AST: 11 U/L (ref 0–37)
Albumin: 4.1 g/dL (ref 3.5–5.2)
Alkaline Phosphatase: 138 U/L — ABNORMAL HIGH (ref 39–117)
BUN: 17 mg/dL (ref 6–23)
CO2: 24 meq/L (ref 19–32)
Calcium: 9 mg/dL (ref 8.4–10.5)
Chloride: 110 meq/L (ref 96–112)
Creatinine, Ser: 0.8 mg/dL (ref 0.40–1.20)
Glucose, Bld: 93 mg/dL (ref 70–99)
Potassium: 4.2 meq/L (ref 3.5–5.3)
Sodium: 143 meq/L (ref 135–145)
Total Bilirubin: 0.5 mg/dL (ref 0.3–1.2)
Total Protein: 7 g/dL (ref 6.0–8.3)

## 2009-01-16 LAB — CBC WITH DIFFERENTIAL/PLATELET
BASO%: 0.3 % (ref 0.0–2.0)
EOS%: 1.2 % (ref 0.0–7.0)
LYMPH%: 18.8 % (ref 14.0–49.7)
MCHC: 35 g/dL (ref 31.5–36.0)
MCV: 92.1 fL (ref 79.5–101.0)
MONO#: 0.1 10*3/uL (ref 0.1–0.9)
MONO%: 5.4 % (ref 0.0–14.0)
Platelets: 9 10*3/uL — CL (ref 145–400)
RBC: 3.22 10*6/uL — ABNORMAL LOW (ref 3.70–5.45)
WBC: 2 10*3/uL — ABNORMAL LOW (ref 3.9–10.3)

## 2009-01-16 LAB — COMPREHENSIVE METABOLIC PANEL
ALT: 15 U/L (ref 0–35)
Alkaline Phosphatase: 150 U/L — ABNORMAL HIGH (ref 39–117)
Sodium: 141 mEq/L (ref 135–145)
Total Bilirubin: 0.6 mg/dL (ref 0.3–1.2)
Total Protein: 6.7 g/dL (ref 6.0–8.3)

## 2009-01-17 LAB — COMPREHENSIVE METABOLIC PANEL WITH GFR
ALT: 15 U/L (ref 0–35)
AST: 16 U/L (ref 0–37)
Albumin: 3.6 g/dL (ref 3.5–5.2)
Alkaline Phosphatase: 144 U/L — ABNORMAL HIGH (ref 39–117)
BUN: 18 mg/dL (ref 6–23)
CO2: 28 meq/L (ref 19–32)
Calcium: 9.1 mg/dL (ref 8.4–10.5)
Chloride: 110 meq/L (ref 96–112)
Creatinine, Ser: 0.69 mg/dL (ref 0.40–1.20)
Glucose, Bld: 94 mg/dL (ref 70–99)
Potassium: 4.6 meq/L (ref 3.5–5.3)
Sodium: 142 meq/L (ref 135–145)
Total Bilirubin: 0.6 mg/dL (ref 0.3–1.2)
Total Protein: 6.9 g/dL (ref 6.0–8.3)

## 2009-01-17 LAB — CBC WITH DIFFERENTIAL/PLATELET
BASO%: 0.3 % (ref 0.0–2.0)
Eosinophils Absolute: 0 10*3/uL (ref 0.0–0.5)
LYMPH%: 17.9 % (ref 14.0–49.7)
MCHC: 35.2 g/dL (ref 31.5–36.0)
MCV: 92.3 fL (ref 79.5–101.0)
MONO%: 8.6 % (ref 0.0–14.0)
Platelets: 36 10*3/uL — ABNORMAL LOW (ref 145–400)
RBC: 2.99 10*6/uL — ABNORMAL LOW (ref 3.70–5.45)

## 2009-01-17 LAB — TECHNOLOGIST REVIEW

## 2009-01-18 LAB — CBC WITH DIFFERENTIAL/PLATELET
BASO%: 0.3 % (ref 0.0–2.0)
Eosinophils Absolute: 0 10*3/uL (ref 0.0–0.5)
MCHC: 35 g/dL (ref 31.5–36.0)
MONO#: 0.2 10*3/uL (ref 0.1–0.9)
NEUT#: 2.6 10*3/uL (ref 1.5–6.5)
Platelets: 25 10*3/uL — ABNORMAL LOW (ref 145–400)
RBC: 3.14 10*6/uL — ABNORMAL LOW (ref 3.70–5.45)
WBC: 3.3 10*3/uL — ABNORMAL LOW (ref 3.9–10.3)
lymph#: 0.4 10*3/uL — ABNORMAL LOW (ref 0.9–3.3)

## 2009-01-18 LAB — COMPREHENSIVE METABOLIC PANEL
ALT: 14 U/L (ref 0–35)
Albumin: 4 g/dL (ref 3.5–5.2)
CO2: 23 mEq/L (ref 19–32)
Calcium: 8.8 mg/dL (ref 8.4–10.5)
Chloride: 108 mEq/L (ref 96–112)
Glucose, Bld: 138 mg/dL — ABNORMAL HIGH (ref 70–99)
Potassium: 4.2 mEq/L (ref 3.5–5.3)
Sodium: 140 mEq/L (ref 135–145)
Total Protein: 7.2 g/dL (ref 6.0–8.3)

## 2009-03-19 ENCOUNTER — Ambulatory Visit: Payer: Self-pay | Admitting: Hematology & Oncology

## 2009-03-19 LAB — COMPREHENSIVE METABOLIC PANEL
AST: 19 U/L (ref 0–37)
Albumin: 3.8 g/dL (ref 3.5–5.2)
BUN: 15 mg/dL (ref 6–23)
CO2: 22 mEq/L (ref 19–32)
Calcium: 9 mg/dL (ref 8.4–10.5)
Chloride: 106 mEq/L (ref 96–112)
Creatinine, Ser: 1.46 mg/dL — ABNORMAL HIGH (ref 0.40–1.20)
Glucose, Bld: 114 mg/dL — ABNORMAL HIGH (ref 70–99)
Potassium: 3.3 mEq/L — ABNORMAL LOW (ref 3.5–5.3)

## 2009-03-19 LAB — HEPATIC FUNCTION PANEL
Alkaline Phosphatase: 82 U/L (ref 39–117)
Bilirubin, Direct: 0.1 mg/dL (ref 0.0–0.3)
Indirect Bilirubin: 0.4 mg/dL (ref 0.0–0.9)
Total Bilirubin: 0.5 mg/dL (ref 0.3–1.2)
Total Protein: 6.3 g/dL (ref 6.0–8.3)

## 2009-03-19 LAB — CBC WITH DIFFERENTIAL (CANCER CENTER ONLY)
BASO%: 1 % (ref 0.0–2.0)
EOS%: 2.1 % (ref 0.0–7.0)
HCT: 30.5 % — ABNORMAL LOW (ref 34.8–46.6)
LYMPH#: 1.3 10*3/uL (ref 0.9–3.3)
MCHC: 33.6 g/dL (ref 32.0–36.0)
MONO#: 2 10*3/uL — ABNORMAL HIGH (ref 0.1–0.9)
NEUT#: 3.4 10*3/uL (ref 1.5–6.5)
NEUT%: 48.7 % (ref 39.6–80.0)
RDW: 13 % (ref 10.5–14.6)
WBC: 6.9 10*3/uL (ref 3.9–10.0)

## 2009-03-19 LAB — TECHNOLOGIST REVIEW CHCC SATELLITE

## 2009-03-28 LAB — CBC WITH DIFFERENTIAL (CANCER CENTER ONLY)
BASO%: 0.6 % (ref 0.0–2.0)
LYMPH%: 14.8 % (ref 14.0–48.0)
MCH: 31.4 pg (ref 26.0–34.0)
MCV: 95 fL (ref 81–101)
MONO%: 16.5 % — ABNORMAL HIGH (ref 0.0–13.0)
NEUT%: 64.9 % (ref 39.6–80.0)
Platelets: 107 10*3/uL — ABNORMAL LOW (ref 145–400)
RDW: 13.6 % (ref 10.5–14.6)

## 2009-03-28 LAB — HEPATIC FUNCTION PANEL
ALT: 16 U/L (ref 0–35)
Bilirubin, Direct: 0.1 mg/dL (ref 0.0–0.3)
Total Bilirubin: 0.4 mg/dL (ref 0.3–1.2)

## 2009-03-28 LAB — BASIC METABOLIC PANEL - CANCER CENTER ONLY
BUN, Bld: 11 mg/dL (ref 7–22)
Calcium: 9.8 mg/dL (ref 8.0–10.3)
Creat: 1.1 mg/dl (ref 0.6–1.2)

## 2009-04-18 ENCOUNTER — Ambulatory Visit: Payer: Self-pay | Admitting: Hematology & Oncology

## 2009-04-18 LAB — CBC WITH DIFFERENTIAL (CANCER CENTER ONLY)
BASO%: 0.5 % (ref 0.0–2.0)
EOS%: 5.6 % (ref 0.0–7.0)
LYMPH#: 1.7 10*3/uL (ref 0.9–3.3)
LYMPH%: 23.2 % (ref 14.0–48.0)
MCHC: 33.5 g/dL (ref 32.0–36.0)
MCV: 105 fL — ABNORMAL HIGH (ref 81–101)
MONO#: 0.6 10*3/uL (ref 0.1–0.9)
Platelets: 143 10*3/uL — ABNORMAL LOW (ref 145–400)
RDW: 18.2 % — ABNORMAL HIGH (ref 10.5–14.6)
WBC: 7.4 10*3/uL (ref 3.9–10.0)

## 2009-04-18 LAB — COMPREHENSIVE METABOLIC PANEL
Albumin: 4.1 g/dL (ref 3.5–5.2)
Alkaline Phosphatase: 93 U/L (ref 39–117)
CO2: 25 mEq/L (ref 19–32)
Calcium: 9 mg/dL (ref 8.4–10.5)
Chloride: 109 mEq/L (ref 96–112)
Glucose, Bld: 70 mg/dL (ref 70–99)
Potassium: 4 mEq/L (ref 3.5–5.3)
Sodium: 143 mEq/L (ref 135–145)
Total Protein: 6 g/dL (ref 6.0–8.3)

## 2009-06-06 ENCOUNTER — Ambulatory Visit: Payer: Self-pay | Admitting: Hematology & Oncology

## 2009-06-07 LAB — CBC WITH DIFFERENTIAL (CANCER CENTER ONLY)
BASO#: 0 10*3/uL (ref 0.0–0.2)
Eosinophils Absolute: 0.2 10*3/uL (ref 0.0–0.5)
HCT: 37.6 % (ref 34.8–46.6)
HGB: 12.9 g/dL (ref 11.6–15.9)
LYMPH%: 30.4 % (ref 14.0–48.0)
MCH: 37 pg — ABNORMAL HIGH (ref 26.0–34.0)
MCV: 108 fL — ABNORMAL HIGH (ref 81–101)
MONO%: 8.5 % (ref 0.0–13.0)
NEUT%: 56 % (ref 39.6–80.0)
RBC: 3.48 10*6/uL — ABNORMAL LOW (ref 3.70–5.32)

## 2009-06-07 LAB — CHCC SATELLITE - SMEAR

## 2009-06-08 LAB — COMPREHENSIVE METABOLIC PANEL
AST: 15 U/L (ref 0–37)
Alkaline Phosphatase: 98 U/L (ref 39–117)
BUN: 17 mg/dL (ref 6–23)
Calcium: 9.4 mg/dL (ref 8.4–10.5)
Creatinine, Ser: 0.99 mg/dL (ref 0.40–1.20)

## 2009-06-08 LAB — MAGNESIUM: Magnesium: 2 mg/dL (ref 1.5–2.5)

## 2009-09-03 ENCOUNTER — Ambulatory Visit: Payer: Self-pay | Admitting: Hematology & Oncology

## 2009-09-05 LAB — CBC WITH DIFFERENTIAL (CANCER CENTER ONLY)
BASO#: 0 10*3/uL (ref 0.0–0.2)
Eosinophils Absolute: 0.2 10*3/uL (ref 0.0–0.5)
HGB: 12.2 g/dL (ref 11.6–15.9)
MCH: 34.4 pg — ABNORMAL HIGH (ref 26.0–34.0)
MONO%: 9.6 % (ref 0.0–13.0)
NEUT#: 3.6 10*3/uL (ref 1.5–6.5)
RBC: 3.55 10*6/uL — ABNORMAL LOW (ref 3.70–5.32)

## 2009-09-06 LAB — LACTATE DEHYDROGENASE: LDH: 193 U/L (ref 94–250)

## 2009-09-06 LAB — COMPREHENSIVE METABOLIC PANEL
ALT: 25 U/L (ref 0–35)
Alkaline Phosphatase: 111 U/L (ref 39–117)
Creatinine, Ser: 1.01 mg/dL (ref 0.40–1.20)
Glucose, Bld: 83 mg/dL (ref 70–99)
Sodium: 143 mEq/L (ref 135–145)
Total Bilirubin: 0.4 mg/dL (ref 0.3–1.2)
Total Protein: 6.6 g/dL (ref 6.0–8.3)

## 2009-09-06 LAB — VITAMIN D 25 HYDROXY (VIT D DEFICIENCY, FRACTURES): Vit D, 25-Hydroxy: 25 ng/mL — ABNORMAL LOW (ref 30–89)

## 2009-12-11 ENCOUNTER — Ambulatory Visit: Payer: Self-pay | Admitting: Hematology & Oncology

## 2009-12-12 LAB — CBC WITH DIFFERENTIAL (CANCER CENTER ONLY)
BASO#: 0 10*3/uL (ref 0.0–0.2)
BASO%: 0.7 % (ref 0.0–2.0)
EOS%: 3.7 % (ref 0.0–7.0)
HGB: 13.3 g/dL (ref 11.6–15.9)
LYMPH%: 32.3 % (ref 14.0–48.0)
MCV: 105 fL — ABNORMAL HIGH (ref 81–101)
NEUT#: 2.7 10*3/uL (ref 1.5–6.5)
NEUT%: 51.9 % (ref 39.6–80.0)
Platelets: 153 10*3/uL (ref 145–400)
RBC: 3.83 10*6/uL (ref 3.70–5.32)
WBC: 5.2 10*3/uL (ref 3.9–10.0)

## 2009-12-12 LAB — COMPREHENSIVE METABOLIC PANEL
Albumin: 4.1 g/dL (ref 3.5–5.2)
Alkaline Phosphatase: 103 U/L (ref 39–117)
Glucose, Bld: 80 mg/dL (ref 70–99)
Sodium: 140 mEq/L (ref 135–145)
Total Bilirubin: 0.4 mg/dL (ref 0.3–1.2)

## 2009-12-12 LAB — VITAMIN D 25 HYDROXY (VIT D DEFICIENCY, FRACTURES): Vit D, 25-Hydroxy: 17 ng/mL — ABNORMAL LOW (ref 30–89)

## 2009-12-12 LAB — LACTATE DEHYDROGENASE: LDH: 160 U/L (ref 94–250)

## 2010-03-12 ENCOUNTER — Ambulatory Visit: Payer: Self-pay | Admitting: Hematology & Oncology

## 2010-03-13 LAB — COMPREHENSIVE METABOLIC PANEL
Albumin: 4.3 g/dL (ref 3.5–5.2)
Alkaline Phosphatase: 111 U/L (ref 39–117)
Calcium: 8.5 mg/dL (ref 8.4–10.5)
Creatinine, Ser: 0.98 mg/dL (ref 0.40–1.20)
Potassium: 4.1 mEq/L (ref 3.5–5.3)
Total Protein: 6.6 g/dL (ref 6.0–8.3)

## 2010-03-13 LAB — CBC WITH DIFFERENTIAL (CANCER CENTER ONLY)
BASO#: 0 10*3/uL (ref 0.0–0.2)
EOS%: 2.9 % (ref 0.0–7.0)
Eosinophils Absolute: 0.2 10*3/uL (ref 0.0–0.5)
HGB: 14 g/dL (ref 11.6–15.9)
LYMPH%: 36.7 % (ref 14.0–48.0)
MCHC: 34.1 g/dL (ref 32.0–36.0)
MCV: 108 fL — ABNORMAL HIGH (ref 81–101)
MONO#: 0.5 10*3/uL (ref 0.1–0.9)
MONO%: 9.5 % (ref 0.0–13.0)
NEUT#: 2.8 10*3/uL (ref 1.5–6.5)
NEUT%: 50.4 % (ref 39.6–80.0)
Platelets: 191 10*3/uL (ref 145–400)
RDW: 11.9 % (ref 10.5–14.6)
WBC: 5.6 10*3/uL (ref 3.9–10.0)

## 2010-05-27 ENCOUNTER — Ambulatory Visit: Payer: Self-pay | Admitting: Hematology & Oncology

## 2010-07-01 ENCOUNTER — Ambulatory Visit: Payer: Self-pay | Admitting: Hematology & Oncology

## 2010-07-03 LAB — CBC WITH DIFFERENTIAL (CANCER CENTER ONLY)
BASO#: 0 10*3/uL (ref 0.0–0.2)
BASO%: 0.4 % (ref 0.0–2.0)
EOS%: 3.3 % (ref 0.0–7.0)
HGB: 13.6 g/dL (ref 11.6–15.9)
LYMPH#: 1.9 10*3/uL (ref 0.9–3.3)
MCH: 34.8 pg — ABNORMAL HIGH (ref 26.0–34.0)
MCHC: 33.3 g/dL (ref 32.0–36.0)
NEUT#: 2.5 10*3/uL (ref 1.5–6.5)
Platelets: 203 10*3/uL (ref 145–400)

## 2010-07-04 LAB — COMPREHENSIVE METABOLIC PANEL
Albumin: 4.2 g/dL (ref 3.5–5.2)
Alkaline Phosphatase: 89 U/L (ref 39–117)
CO2: 24 mEq/L (ref 19–32)
Creatinine, Ser: 0.93 mg/dL (ref 0.40–1.20)
Potassium: 4.2 mEq/L (ref 3.5–5.3)

## 2010-07-09 ENCOUNTER — Ambulatory Visit (HOSPITAL_COMMUNITY): Admission: RE | Admit: 2010-07-09 | Discharge: 2010-07-09 | Payer: Self-pay | Admitting: Hematology & Oncology

## 2010-08-14 ENCOUNTER — Ambulatory Visit (HOSPITAL_COMMUNITY): Admission: RE | Admit: 2010-08-14 | Discharge: 2010-08-14 | Payer: Self-pay | Admitting: Endocrinology

## 2010-09-02 ENCOUNTER — Ambulatory Visit (HOSPITAL_COMMUNITY): Admission: RE | Admit: 2010-09-02 | Payer: Self-pay | Admitting: Hematology & Oncology

## 2010-10-19 ENCOUNTER — Encounter: Payer: Self-pay | Admitting: Hematology & Oncology

## 2011-01-01 ENCOUNTER — Encounter (HOSPITAL_BASED_OUTPATIENT_CLINIC_OR_DEPARTMENT_OTHER): Payer: BC Managed Care – PPO | Admitting: Hematology & Oncology

## 2011-01-01 ENCOUNTER — Other Ambulatory Visit: Payer: Self-pay | Admitting: Family

## 2011-01-01 DIAGNOSIS — B029 Zoster without complications: Secondary | ICD-10-CM

## 2011-01-01 DIAGNOSIS — C8190 Hodgkin lymphoma, unspecified, unspecified site: Secondary | ICD-10-CM

## 2011-01-01 DIAGNOSIS — Z9484 Stem cells transplant status: Secondary | ICD-10-CM

## 2011-01-01 LAB — CBC WITH DIFFERENTIAL (CANCER CENTER ONLY)
EOS%: 3.1 % (ref 0.0–7.0)
LYMPH%: 26.9 % (ref 14.0–48.0)
MCHC: 33.7 g/dL (ref 32.0–36.0)
MCV: 102 fL — ABNORMAL HIGH (ref 81–101)
MONO%: 13.6 % — ABNORMAL HIGH (ref 0.0–13.0)
NEUT%: 55.9 % (ref 39.6–80.0)
Platelets: 153 10*3/uL (ref 145–400)
RBC: 3.88 10*6/uL (ref 3.70–5.32)
RDW: 12.8 % (ref 11.1–15.7)
WBC: 6.1 10*3/uL (ref 3.9–10.0)

## 2011-01-01 LAB — COMPREHENSIVE METABOLIC PANEL
ALT: 19 U/L (ref 0–35)
AST: 16 U/L (ref 0–37)
BUN: 23 mg/dL (ref 6–23)
Calcium: 9.2 mg/dL (ref 8.4–10.5)
Potassium: 4.2 mEq/L (ref 3.5–5.3)
Sodium: 142 mEq/L (ref 135–145)
Total Protein: 6.2 g/dL (ref 6.0–8.3)

## 2011-01-01 LAB — LACTATE DEHYDROGENASE: LDH: 178 U/L (ref 94–250)

## 2011-01-07 ENCOUNTER — Other Ambulatory Visit: Payer: Self-pay | Admitting: General Surgery

## 2011-01-07 DIAGNOSIS — E041 Nontoxic single thyroid nodule: Secondary | ICD-10-CM

## 2011-01-07 LAB — CROSSMATCH: Antibody Screen: NEGATIVE

## 2011-01-07 LAB — PREPARE PLATELETS

## 2011-01-08 LAB — DIFFERENTIAL
Basophils Absolute: 0 10*3/uL (ref 0.0–0.1)
Eosinophils Absolute: 0 10*3/uL (ref 0.0–0.7)
Eosinophils Relative: 1 % (ref 0–5)
Lymphocytes Relative: 12 % (ref 12–46)

## 2011-01-08 LAB — CROSSMATCH

## 2011-01-08 LAB — CBC
HCT: 20.5 % — ABNORMAL LOW (ref 36.0–46.0)
Platelets: 54 10*3/uL — ABNORMAL LOW (ref 150–400)
RDW: 14.6 % (ref 11.5–15.5)

## 2011-01-12 LAB — COMPREHENSIVE METABOLIC PANEL
ALT: 15 U/L (ref 0–35)
AST: 16 U/L (ref 0–37)
CO2: 26 mEq/L (ref 19–32)
Calcium: 8.8 mg/dL (ref 8.4–10.5)
GFR calc Af Amer: >60 mL/min (ref >60)
GFR calc non Af Amer: >60 mL/min (ref >60)
Sodium: 139 mEq/L (ref 135–145)

## 2011-01-12 LAB — BASIC METABOLIC PANEL
Calcium: 8.7 mg/dL (ref 8.4–10.5)
GFR calc Af Amer: >60 mL/min (ref >60)
GFR calc non Af Amer: >60 mL/min (ref >60)
Sodium: 142 mEq/L (ref 135–145)

## 2011-01-12 LAB — PROTIME-INR
INR: 1.2 (ref 0.00–1.49)
INR: 1.2 (ref 0.00–1.49)
Prothrombin Time: 15.7 seconds — ABNORMAL HIGH (ref 11.6–15.2)
Prothrombin Time: 16.1 seconds — ABNORMAL HIGH (ref 11.6–15.2)

## 2011-01-12 LAB — DIFFERENTIAL
Eosinophils Absolute: 0.2 10*3/uL (ref 0.0–0.7)
Eosinophils Relative: 2 % (ref 0–5)
Lymphs Abs: 1.1 10*3/uL (ref 0.7–4.0)
Monocytes Absolute: 0.9 10*3/uL (ref 0.1–1.0)
Monocytes Relative: 11 % (ref 3–12)

## 2011-01-12 LAB — URINALYSIS, ROUTINE W REFLEX MICROSCOPIC
Glucose, UA: NEGATIVE mg/dL
Leukocytes, UA: NEGATIVE
Protein, ur: NEGATIVE mg/dL
Specific Gravity, Urine: 1.017 (ref 1.005–1.030)
pH: 6 (ref 5.0–8.0)

## 2011-01-12 LAB — LACTATE DEHYDROGENASE
LDH: 169 U/L (ref 94–250)
LDH: 175 U/L (ref 94–250)

## 2011-01-12 LAB — MAGNESIUM: Magnesium: 2.4 mg/dL (ref 1.5–2.5)

## 2011-01-12 LAB — CBC
Hemoglobin: 10.4 g/dL — ABNORMAL LOW (ref 12.0–15.0)
MCHC: 32.9 g/dL (ref 30.0–36.0)
RBC: 3.63 MIL/uL — ABNORMAL LOW (ref 3.87–5.11)
RBC: 3.74 MIL/uL — ABNORMAL LOW (ref 3.87–5.11)
WBC: 7.8 10*3/uL (ref 4.0–10.5)
WBC: 7.9 10*3/uL (ref 4.0–10.5)

## 2011-01-12 LAB — URINE MICROSCOPIC-ADD ON

## 2011-01-13 LAB — GLUCOSE, CAPILLARY: Glucose-Capillary: 99 mg/dL (ref 70–99)

## 2011-02-10 NOTE — Op Note (Signed)
NAMEAMIE, COWENS            ACCOUNT NO.:  [id number]   MEDICAL RECORD NO.:  [id number]          PATIENT TYPE:  AMB   LOCATION:  SDS                          FACILITY:  [facility]   PHYSICIAN:  Salvatore Decent. Dorris Fetch, M.D.DATE OF BIRTH:  [birth year]   DATE OF PROCEDURE:  DATE OF DISCHARGE:  09/04/2008                               OPERATIVE REPORT   PREOPERATIVE DIAGNOSIS:  Adenopathy, status post treatment for stage II  Hodgkin disease.   POSTOPERATIVE DIAGNOSIS:  Adenopathy, status post treatment for stage II  Hodgkin disease.   PROCEDURE:  Left scalene node biopsy.   SURGEON:  Salvatore Decent. Dorris Fetch, MD   ASSISTANT:  None.   ANESTHESIA:  General.   FINDINGS:  Proximally, 2 cm fleshy lymph node, posterior scalene, frozen  section atypical, lymphoproliferative process.   CLINICAL NOTE:  Ms. Harnisch is a 55 year old woman who has a history of  stage II nodular sclerosing Hodgkin disease.  She had undergone  chemotherapy and radiation.  She now was found on PET scan to have  increased uptake in the scalene nodes, aortopulmonary window nodes, and  celiac lymph node.  She was referred for biopsy for diagnostic purposes.  The patient understood the indications, risks, benefits, and alternative  treatments.  Plan would be to proceed with excision of the scalene node  under local anesthesia.  If that was not accessible due to its deep  location, we would then proceed with a Chamberlain procedure to biopsy  the aortopulmonary window nodes.   OPERATIVE NOTE:  Ms. Gabrielsen was brought to the preop holding area on  September 04, 2008, where the Anesthesia Service established intravenous  access,  intravenous antibiotics were administered.  She was taken to  the operating room and given intravenous sedation under the monitoring  of the Anesthesia Service.  The left neck was prepped and draped in  usual fashion.  Lidocaine 1% with epinephrine was utilized to  anesthetize the skin,  subcutaneous tissue, and area around the scalene  nodes.  A total of 8 mL of local anesthetic was utilized.  Incision was  made in the left supraclavicular fossa and was carried through the skin  and subcutaneous tissue.  The platysma was divided, the scalene fat pad  was identified.  There were multiple small nodes within it.  Superficial  portion of the fat pad was excised and sent for frozen section after  marking the largest of the nodes within the fat pad.  This showed  lymphoid tissue, but no distinct pathology.  The node was palpable deep  within this supraclavicular fossa, careful dissection was carried out to  excise the enlarged lymph node, it was approximately 2 cm in diameter.  It was a fleshy node without significant invasion of surrounding tissue.  Frozen section of this node revealed atypical lymphoproliferative  disorder.  Further diagnostic workup to be done on permanent sections  and with flow cytometry.  The wound was packed and then inspected for  hemostasis.  Packing was removed.  The platysma was  closed with interrupted 3-0 Vicryl sutures and the skin was closed with  4-0 Vicryl subcuticular  suture.  All sponge, needle, and instrument  counts were correct at the end of procedure and the patient was taken to  the recovery room in good condition.      Salvatore Decent Dorris Fetch, M.D.  Electronically Signed     SCH/MEDQ  D:  09/04/2008  T:  09/05/2008  Job:  [id number]   cc:   Rose Phi. Myna Hidalgo, M.D.  Robert A. Nicholos Johns, M.D.

## 2011-02-10 NOTE — H&P (Signed)
NAMEMARCIEL, [name]            ACCOUNT NO.:  [id number]   MEDICAL RECORD NO.:  [id number]          PATIENT TYPE:  INP   LOCATION:  1302                         FACILITY:  [facility]   PHYSICIAN:  Rose Phi. Myna Hidalgo, M.D. DATE OF BIRTH:  [birth year]   DATE OF ADMISSION:  12/22/2007  DATE OF DISCHARGE:                              HISTORY & PHYSICAL   REASON FOR ADMISSION:  1. Right lower lobe pneumonia.  2. History of Hodgkin's disease.   HISTORY OF PRESENT ILLNESS:  [name] is a 55 year old white female  who has a history of Hodgkin's disease.  She received all of her  chemotherapy a couple months ago.  She now is getting radiation  treatments.   I saw her in the office on Monday.  She is having a cough.  I tried  treat her symptomatically.  We gave her IV antibiotics in the office.  I  also put her on oral antibiotics with Levaquin.  The patient felt better  the next day.  Unfortunate, she began to get worse on Wednesday.  She  was in radiation today.  She had a chest x-ray done.  Apparently, it  showed a right lower lobe pneumonia.   We subsequently are admitting her for management.   She had a temperature 101.6 last night.  There has a been a lot of  coughing.  It is productive of yellowish sputum.  There is no  hemoptysis.   Of note, she is on Coumadin for DVT.   PAST MEDICAL HISTORY:  See old records.   ALLERGIES:  PENICILLIN.   MEDICATIONS:  1. Cymbalta 60 mg p.o. b.i.d.  2. Coumadin I think 5 mg daily.   SOCIAL HISTORY:  Negative for tobacco or alcohol use.   PHYSICAL EXAMINATION:  GENERAL:  This is a slightly ill-appearing white  female, in no obvious respiratory distress.  VITAL SIGNS:  Temperature 100, pulse is 110, respiratory rate 22, blood  pressure 130/80.  HEAD/NECK:  Normocephalic, atraumatic skull.  There is are no oral  lesions.  There is no adenopathy in her neck.  She has no thrush.  LUNGS:  With some scattered wheezes and crackles bilaterally.  CARDIAC:  Tachycardic, but regular.  There were no murmurs, rubs, or  bruits.  ABDOMEN:  Soft, with good bowel sounds.  There is no palpable abdominal  mass.  No palpable hepatosplenomegaly.  EXTREMITIES:  Show no clubbing, cyanosis, or edema.  NEUROLOGIC:  No focal neurological deficits.   LABORATORY STUDIES:  All pending.   IMPRESSION:  [name] is a 55 year old female with right lower lobe  pneumonia.  We will admit her.  She is not on chemotherapy, so her  immune system should not be compromised.  I suspect that this is just a  community-acquired pneumonia.   I will put her on Primaxin.  This should be adequate for coverage.   We will give her some nebulizers.  She will have some cough medicine to  help with her cough.   We will continue her Coumadin.  We will check her pro time today.   Hopefully, after a couple  days, she will be able to be discharged on  oral antibiotics as an outpatient.      Rose Phi. Myna Hidalgo, M.D.  Electronically Signed     PRE/MEDQ  D:  12/22/2007  T:  12/23/2007  Job:  [id number]

## 2011-02-10 NOTE — Discharge Summary (Signed)
Lacey [name], Lacey [name]            ACCOUNT NO.:  [id number]   MEDICAL RECORD NO.:  [id number]          PATIENT TYPE:  INP   LOCATION:  1309                         [facility]ACILITY:  [facility]   PHYSICIAN:  Rose Phi. Myna Hidalgo, M.D. DATE OF BIRTH:  [birth year]   DATE OF ADMISSION:  10/16/2008  DATE OF DISCHARGE:                               DISCHARGE SUMMARY   DIAGNOSIS ON DISCHARGE:  1. Recurrent Hodgkins disease  2. Chemotherapy with c#1 of ICE   CONDITION ON DISCHARGE:  Stable   ACTIVITIES:  As tolerated   DIET:  Regular   FOLLOWUP:  Dr. Gustavo Lah office in 2 wks   MEDICATIONS UPON DISCHARGE:  1. Lyrica 175mg  p.o. b.i.d.  2. Coumadin 2.5mg  po qday  3. Zofran ODT 8mg  po b.i.d. x3 days.  4. Levaquin 500 mg p.o. daily x3 days.  5. Phenergan 12.5 mg p.o. q.6h. p.r.n. nausea and vomiting.   HOSPITAL COURSE:  Lacey [name] was admitted for cycle #1 of  chemotherapy.  Diagnosed by mediastinoscopy.  She had a Port-A-Cath  placement.  She was admitted to  1300.  Her BSA was 1.9 m2.  She  receives anti-emetics w/ Zofran, Decadron and  Ativan.  She had IV  fluids going already.   CHEMOTHERAPY:  Her chemotherapy doses were as follows:  1. VP-16 (100mg /m2/day) 190mg /d for 3 days.  2. Carboplatin (AUC of 5).  3. Ifosfamide (5000 mg/m2) 9500 mg via 24-hour infusion, day #3.  4. Mesna 9500 mg mixed with ifosfamide.  However, her creatinine      clearance was 120 cc/min.   She has had no problems with nausea or vomiting.  She was out of bed.  She had no urinary frequency; however, she had very good urinary output.  There was no diarrhea.  She is eating well.  She is having help.  She  has had a little bit of a cough.  She has had no shortness of breath.   We did do lab work on her on the 26th.  Her magnesium was 2.4.  Her LDH  was 169.  Metabolic panel showed a sodium of 142, potassium 4.5,  creatinine 0.65.  Her INR was 1.3.   She will complete her chemotherapy on the evening of the 22nd.  We  will  subsequently discharge her on the morning of the 23rd.  All of her vital  signs are stable on the 22nd.   PHYSICAL EXAMINATION:  VITAL SIGNS:  Pulse 94, respiratory rate 16,  blood pressure 116/82.  HEAD/NECK:  No ocular or oral lesions.  No palpable cervical or  supraclavicular lymph nodes.  She had no palpable thyroid.  LUNGS:  Clear bilaterally.  CARDIAC:  Regular rate and rhythm.  No murmurs, rubs, or bruits.  ABDOMEN:  Soft with good bowel sounds.  There is a palpable mass.  No  palpable hepatosplenomegaly.  BACK:  No tenderness of the spine, ribs, or hips.  EXTREMITIES:  No clubbing, cyanosis or edema.      Rose Phi. Myna Hidalgo, M.D.  Electronically Signed     PRE/MEDQ  D:  10/19/2008  T:  10/19/2008  Job:  [id number] 

## 2011-02-10 NOTE — Consult Note (Signed)
NEW PATIENT CONSULTATION   Lacey [name], Lacey [name]  DOB:  10-25-1955                                        August 13, 2008  CHART #:  [id number]   REASON FOR CONSULTATION:  Scalene and mediastinal adenopathy and the  patient with history of Hodgkin disease.   HISTORY OF PRESENT ILLNESS:  The patient is a 55 year old woman with a  history of stage II nodular sclerosing Hodgkin disease.  She was treated  with 6 cycles of ABVD followed by consolidation radiation.  She recently  in September was found to have some ill-defined nodes in the  mediastinum.  She had a followup PET scan on July 31, 2008, that  showed increased activity within the celiac node.  There also was an  increased uptake in the left supraclavicular node as well as some  aortopulmonary window nodes.  She states that over the past couple of  months, she has been having occasional chills.  She does not have much  energy and she has had itchy skin and all these symptoms were present  prior to her previous diagnosis.   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   CURRENT MEDICATIONS:  1. Lyrica 75 mg b.i.d.  2. Coumadin 2.5 mg daily.   ALLERGIES:  She is allergic to penicillin.   FAMILY HISTORY:  Noncontributory.   SOCIAL HISTORY:  She is married.  She works as a Haematologist.  She does  not smoke or drink.   REVIEW OF SYSTEMS:  As noted in HPI.  Also, pain in legs with walking  and pain in her feet with lying flat.  All other systems are negative.   PHYSICAL EXAMINATION:  GENERAL:  The patient is a well-appearing 52-year-  old white female in no acute distress.  VITAL SIGNS:  Her blood pressure is 116/78, pulse 108, respirations are  18, and her oxygen saturation is 97% on room air.  LUNGS:  Clear with equal breath sounds.  She has a fullness in  the left  supraclavicular region.  No discrete palpable node is present.  She has  a port in her right chest with a catheter palpable in her right lower  neck.  She has a well-healed cervical scar from her previous cervical  fixation.  There is no axillary or epitrochlear adenopathy.   LABORATORY DATA:  PET scan was reviewed.  In addition her white blood  cell count 6.3, hematocrit 38, platelets 332, sodium 142, potassium 4.6,  BUN 21, creatinine 0.75, and albumin 3.9.   IMPRESSION:  The patient is a 55 year old woman with a history of stage  II nodular sclerosing Hodgkin disease who presents with symptoms similar  to her original presentation who has by PET scan at least 3 areas of  adenopathy with increased uptake including her left supraclavicular  region and the aortopulmonary window.  I discussed with her options and  of course she understands that this would be a diagnostic procedure and  not therapeutic.  The option would be a scalene node biopsy under local  anesthesia which would be the first choice and if that was not  diagnostic, then a left anterior mediastinotomy for biopsy of the  aortopulmonary  window node.  She does understand that this  supraclavicular node is relatively posterior and this maybe difficult to  localize.  It is very difficult to palpate, although, there is a  fullness in that region and she states she feels something rolling as it  is palpated.  She understands the risks including bleeding, infection,  and possible pneumothorax and she agrees to proceed.  We will plan to  have her come in for surgery on Tuesday, September 04, 2008.  She will  need to stop her Coumadin 3 or 4 days of headed time.  We will get in  touch with her regarding that.  She should be able to go back on the  Coumadin within a day or two postoperatively.   Salvatore Decent Dorris Fetch, M.D.  Electronically Signed   SCH/MEDQ  D:  08/13/2008  T:  08/13/2008  Job:  [id number]   cc:   Molly Maduro A.  Nicholos Johns, M.D.  Rose Phi. Myna Hidalgo, M.D.

## 2011-02-10 NOTE — Discharge Summary (Signed)
Lacey [name], Lacey [name]            ACCOUNT NO.:  [id number]   MEDICAL RECORD NO.:  [id number]          PATIENT TYPE:  INP   LOCATION:  1302                         [facility]ACILITY:  [facility]   PHYSICIAN:  Rose Phi. Myna Hidalgo, M.D. DATE OF BIRTH:  Nov 13, 1955   DATE OF ADMISSION:  12/22/2007  DATE OF DISCHARGE:  12/27/2007                               DISCHARGE SUMMARY   DIAGNOSES UPON DISCHARGE:  1. Right lower lobe pneumonia - community acquired.  2. Stage 2 nodular sclerosing Hodgkin's disease.  3. History of deep vein thrombosis/pulmonary embolism.   CONDITION ON DISCHARGE:  Is stable.   ACTIVITIES:  As tolerated.   DIET:  No restrictions.   FOLLOW-UP:  1. The patient to follow-up with Dr. Myna Hidalgo on December 30, 2007.  2. The patient will follow-up with radiation oncology tomorrow for      final treatment of radiation to the chest.   Her medications upon discharge were:  1. Coumadin 5 mg p.o. daily  2. Levaquin 750 mg p.o. b.i.d. daily for 4 days.  3. Combivent inhaler 2 puffs q.i.d.  4. Prednisone 60 mg p.o. daily with food.  5. Tessalon Perles 100 mg p.o. q.8h. p.r.n.  6. Zofran 8 mg q.8h. p.r.n. nausea and vomiting.   HOSPITAL COURSE:  Lacey [name] was admitted for right lower lobe  pneumonia.  She is a 51-year white female with stage 2 Hodgkin's  disease.  She completed chemotherapy a couple months ago.  She now is on  radiation therapy for mediastinal involvement.  She was admitted because  of progressive shortness breath and cough.  She was producing copious  amounts of yellow mucus.  Chest x-ray was done which showed right lower  lobe infiltrate.   She was admitted.  She had her Port-A-Cath accessed.  She has IV  Primaxin given to her.   Blood cultures were taken.  All cultures were negative with respect to  the blood and sputum.   She got nebulizers.  She began to feel better in about three days.  She  defervesced.  Her temperatures remained stable.   She became a little  more active.  She still had a pretty bad cough.  She  was getting Tussionex, and we added Tessalon Perles.   I felt that she may benefit from this prednisone.  She may have had some  radiation pneumonitis contributing to the cough.  As such, we put her on  Solu-Medrol.  We started on Solu-Medrol on the 30th.  This helped with  her cough.  Also helped her appetite.  We were converted over to  prednisone as an outpatient.   She continued radiation while she was in the hospital.  She did well  with the radiation.  She has her 19th treatment on the day of discharge.   She had no problems with Coumadin while in Hospital.  I am not going to  adjust her Coumadin dose.  On the 31st, her INR was 1.5.  On the 30th,  her INR was 1.2.  I did not feel that we needed to make adjustments  given her situation with respect  to her antibiotics.   On the 30th, her blood count showed a white cell count of 3.8,  hemoglobin 11.4, hematocrit 32.5, platelet count 248.  Her sodium was  134, potassium 3.9, BUN 6, creatinine 0.5.   Upon discharge, vital signs showed temperature 98.7, pulse 115,  respiratory rate 21, blood pressure 113/77.  Oxygen saturation was 94%  on room air.  Her lungs have much better breath sounds bilaterally.  I heard very  little wheezing.  She had good air movement bilaterally.  There are no  rhonchi.  CARDIAC EXAM:  Was tachycardia but regular.  There were no murmurs,  rubs, or bruits.  ABDOMINAL EXAM:  Soft with good bowel sounds.  There is no palpable  abdominal mass.  No palpable hepatosplenomegaly.  EXTREMITIES:  Show no clubbing, cyanosis, or edema.  NEUROLOGICAL EXAM:  Showed no focal neurological deficits.   I felt that we could get her home and take care of her as an outpatient  with oral antibiotics.  She has Levaquin at home which should be  appropriate for treatment of her community-acquired pneumonia.      Rose Phi. Myna Hidalgo, M.D.  Electronically Signed      PRE/MEDQ  D:  12/27/2007  T:  12/27/2007  Job:  [id number]   cc:   Molly Maduro A. Nicholos Johns, M.D.  Fax: [phone]

## 2011-02-10 NOTE — H&P (Signed)
NAMEASHELYNN, MARKS            ACCOUNT NO.:  [id number]   MEDICAL RECORD NO.:  [id number]        PATIENT TYPE:  LINP   LOCATION:                               FACILITY:  [facility]   PHYSICIAN:  Rose Phi. Myna Hidalgo, M.D. DATE OF BIRTH:  Sep 02, 1956   DATE OF ADMISSION:  10/16/2008  DATE OF DISCHARGE:                              HISTORY & PHYSICAL   She has no problems with nausea or vomiting.  She eats well.  She is  very active.  There is fever, sweats or chills.  She does have a little  bit of a dry cough but this is chronic.   PAST MEDICAL HISTORY:  Remarkable for diabetes, fatigue.   ALLERGIES:  Penicillin.   SOCIAL HISTORY:  Negative for tobacco or alcohol use.   PHYSICAL EXAMINATION:  This is a well developed, well nourished white  female in no obvious distress.  VITAL SIGNS:  Temperature 98, pulse 101, respiratory rate 18, blood  pressure 127/87, weight 91.4.  HEENT:  Normocephalic, atraumatic skull.  No palpable cervical or  supraclavicular lymph nodes.  LUNGS:  Clear bilaterally.  CARDIAC:  Tachycardic but regular.  No murmur, gallop or bruit.  ABDOMEN:  Soft.  Good bowel sounds.  There is no palpable dullness or  palpable hepatosplenomegaly.  EXTREMITIES:  No clubbing, cyanosis or edema.  NEUROLOGICAL:  No focal neurologic deficits.   LABORATORY DATA:  White blood cell count 7.9, hemoglobin 10.9,  hematocrit 38.7, platelet 328.  Electrolytes show a sodium 139,  potassium 4, BUN 18, creatinine 0.74.  Albumin 3, total protein 7.4.   IMPRESSION:  This is a 55 year old white female with recurrent Hodgkin's  lymphoma.   We will treat her with IV fluids.   Of note, her EKG did show some sinus tachycardia.      Rose Phi. Myna Hidalgo, M.D.  Electronically Signed     PRE/MEDQ  D:  10/17/2008  T:  10/17/2008  Job:  [id number]

## 2011-02-10 NOTE — Op Note (Signed)
Lacey [name], Lacey [name]            ACCOUNT NO.:  [id number]   MEDICAL RECORD NO.:  [id number]          PATIENT TYPE:  AMB   LOCATION:  OMED                         FACILITY[facility]  [facility]   PHYSICIAN:  Rose Phi. Myna Hidalgo, M.D. DATE OF BIRTH:  1956-09-11   DATE OF PROCEDURE:  12/10/2008  DATE OF DISCHARGE:                               OPERATIVE REPORT   PROCEDURE:  Left posterior iliac crest bone marrow biopsy and aspirate.   Ms. Reining was brought to the short-stay unit.  She had an IV placed  without difficulty.   She had a Mallampati score of 1.  ASA class was I.   She was then placed onto her right side.  She received a total of 5 mg  Versed and 15 mg Demerol for IV sedation.   The left posterior iliac crest region was prepped and draped in a  sterile fashion.  10 mL of lidocaine was infiltrated under the skin down  to the periosteum.  I needed to use a 22-gauge spinal needle to reach  the periosteum.   A #11 scalpel was used to make an incision into the skin.  We got 2 good  bone marrow aspirates out without difficulty.   I then used the long Jamshidi needle to reach the posterior iliac crest.  We got an excellent bone marrow biopsy core.   The patient tolerated the procedure well.  There were no complications.      Rose Phi. Myna Hidalgo, M.D.  Electronically Signed     PRE/MEDQ  D:  12/10/2008  T:  12/10/2008  Job:  [id number]

## 2011-02-10 NOTE — Assessment & Plan Note (Signed)
OFFICE VISIT   Lacey [name], Lacey [name]  DOB:  31-Oct-1955                                        September 10, 2008  CHART #:  [id number]   The patient is a 55 year old woman with a history of stage II nodular  sclerosing Hodgkin disease.  She was treated with chemotherapy and  radiation.  In September, she had some ill-defined nodes in her  mediastinum.  A followup PET in November showed some increased activity  within the celiac nodes and she also was having some symptoms that were  similar to her original presentation.  We did a left-sided scalene node  biopsy on her on September 04, 2008.  The initial frozen section showed a  lymphoproliferative disorder.  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  She was already informed of  this diagnosis by Dr. Myna Hidalgo.   PHYSICAL EXAMINATION:  VITAL SIGNS:  Stable.  GENERAL:  Her incision is healing well with no signs of infection.   IMPRESSION:  The patient is a 55 year old woman with recurrent Hodgkin  disease.  She has follow up with Dr. Myna Hidalgo.  He will direct her  treatment.  I would be happy to assist if anyway possible.   Salvatore Decent Dorris Fetch, M.D.  Electronically Signed   SCH/MEDQ  D:  09/10/2008  T:  09/10/2008  Job:  [id number]   cc:   Rose Phi. Myna Hidalgo, M.D.  Robert A. Nicholos Johns, M.D.

## 2011-02-13 NOTE — Discharge Summary (Signed)
[place]. [facility]  Patient:    Lacey [name], Lacey [name]                   MRN: [id number] Adm. Date:  [id number] Disc. Date: [id number] Attending:  Erich Montane CC:         Genene Churn. Love, M.D.                           Discharge Summary  ADMITTING DIAGNOSES:  Guillain-Barre.  DISCHARGE DIAGNOSIS:  Guillain-Barre.  ADDITIONAL DIAGNOSES:  Hypertension.  PROCEDURES:  The patient received intravenous IVIG, one dose a day for five days.  MEDICATIONS: 1. Neurontin 300 mg one twice a day.  Prescription was written for that. 2. She should resume her pre-hospitalization medications which included    Vicodin one every four to six hours as needed. 3. Normodyne 100 mg one every 12 hours. 4. Nasacort one to two twice a day. 5. Afrin as needed. 6. Birth control.  ACTIVITY:  On discharge, ambulate with walker with PT.  DIET:  No restrictions.  WOUND CARE: Not applicable.  SPECIAL INSTRUCTIONS:  The patient is to have physical therapy, occupational therapy, and R.N. visits from [facility] for rehabilitation for Guillain-Barre.  FOLLOW-UP:  Follow up with Dr. Sandria Manly.  She is to call [phone] for appointment.  HOSPITAL COURSE:  Please refer to history and physical for details. Briefly, this is a 55 year old woman who was admitted last month with Guillain-Barre. She was admitted for progressing weakness and difficulty getting around.  This time received some IVIG.  She had an unremarkable course except that she had some headache and nausea associated with the IVIG.  Giving her intravenous Phenergan seemed to help with that, and we have treated her with that for the last couple of doses.  Her strength was about 3/5 in the lower extremities on admission and by discharge it was 4+/5.  Upper extremities were essentially normal at 5/5 at the time of discharge.  DISCHARGE CONDITION:  Ambulatory with the walker with about 4/5 strength in the lower  extremities and 5/5 strength in the upper extremities, subjectively improved and headache improved off the Phenergan.  LABORATORY DATA:  The patient did have a basic metabolic panel which was essentially normal; sodium was 134.  The CBC was normal with platelets of 465,000. DD:  05/16/00 TD:  05/17/00 Job: [id number]

## 2011-02-13 NOTE — Op Note (Signed)
NAMESOHA, Lacey [name]            ACCOUNT NO.:  [id number]   MEDICAL RECORD NO.:  [id number]          PATIENT TYPE:  AMB   LOCATION:  DSC                          FACILITY:  [facility]   PHYSICIAN:  Cindee Salt, M.D.       DATE OF BIRTH:  Jul 07, 1956   DATE OF PROCEDURE:  12/02/2006  DATE OF DISCHARGE:                               OPERATIVE REPORT   PREOPERATIVE DIAGNOSIS:  Carpal tunnel syndrome right hand.   POSTOPERATIVE DIAGNOSIS:  Carpal tunnel syndrome right hand.   OPERATION:  Decompression right median nerve.   SURGEON:  Cindee Salt, M.D.   ASSISTANT:  R.N.   ANESTHESIA:  Forearm based IV regional.   HISTORY:  The patient is a 55 year old female with a history of carpal  tunnel syndrome, EMG nerve conductions positive.  This has not responded  to conservative treatment.  She is desirous of surgical release.  Postoperative course are discussed along with the risks and  complications.  She is aware that there is no guarantee with the  surgery, possibility of infection, recurrence, injury to arteries,  nerves, tendons, incomplete relief of symptoms, dystrophy.  In the  preoperative area the patient is seen, questions encouraged and  answered.  The extremity marked by both the patient and surgeon.   PROCEDURE:  The patient is brought to the operating room where a forearm  based IV regional anesthetic was carried out without difficulty.  She  was prepped using DuraPrep, supine position, right arm free.  A  longitudinal incision was made in the palm, carried down through  subcutaneous tissue.  Bleeders were electrocauterized.  Palmar fascia  was split, superficial palmar arch identified, flexor tendon of the ring  and little finger identified to the ulnar side of the median nerve.  The  carpal retinaculum was incised with sharp dissection.  A right-angle and  Sewall retractor were placed between skin and forearm fascia.  The  fascia was released for approximately a centimeter and  a half proximal  to the wrist crease under direct vision.  Canal was explored.  The  tenosynovium tissue was moderately thickened.  The area of compression  to the nerve was apparent.  No further lesions were identified.  The  wound was irrigated.  The skin was closed with interrupted 5-0 nylon  sutures.  Sterile compressive dressing and splint was applied.  The  patient tolerated the procedure well, was taken to the recovery room for  observation in satisfactory condition.  She is discharged home to return  to the [facility] in [place] in 1 week on Vicodin.           ______________________________  Cindee Salt, M.D.    GK/MEDQ  D:  12/02/2006  T:  12/02/2006  Job:  [id number]   cc:   Dario Guardian, M.D.

## 2011-02-13 NOTE — Op Note (Signed)
[name], [name]            ACCOUNT NO.:  [id number]   MEDICAL RECORD NO.:  [id number]          PATIENT TYPE:  AMB   LOCATION:  DSC                          FACILITY:  [facility]   PHYSICIAN:  Cindee Salt, M.D.       DATE OF BIRTH:  [birth year]   DATE OF PROCEDURE:  12/30/2006  DATE OF DISCHARGE:                               OPERATIVE REPORT   PREOPERATIVE DIAGNOSIS:  Carpal tunnel syndrome left hand.   POSTOPERATIVE DIAGNOSIS:  Carpal tunnel syndrome left hand.   OPERATION:  Carpal tunnel release, left hand.   SURGEON:  Dr. Merlyn Lot   ASSISTANT:  Carolyne Fiscal R.N.   ANESTHESIA:  Forearm based IV regional.   DATE OF OPERATION:  December 30, 2006.   HISTORY:  The patient is a 55 year old female with a history of lateral  carpal tunnel syndrome, EMG nerve conductions positive, not responsive  to conservative treatment.  She has undergone release on her right side  and is admitted now for release of the left side.  She is aware of risks  and complications including infection, recurrence, injury to arteries,  nerves, tendons, incomplete relief of symptoms, dystrophy in the  preoperative area. The extremity is marked by both the patient and  surgeon, questions encouraged and answered.   PROCEDURE:  The patient is brought to the operating room where forearm  based IV regional anesthetic was carried out without difficulty.  She  was prepped using DuraPrep, supine position, left arm free.  A  longitudinal incision was made in the palm, carried down through  subcutaneous tissue.  Bleeders were electrocauterized. Palmar fascia was  split, superficial palmar arch identified, flexor tendon to the ring and  little finger identified to the ulnar side of median nerve. Carpal  retinaculum was incised with sharp dissection, right angle and Sewall  retractors were placed between skin and forearm fascia.  The fascia was  released for approximately 1.5 cm proximal to the wrist crease under  direct vision.   Canal was explored.  No further lesions were identified.  Area compression of the nerve was apparent.  The wound was irrigated.  The skin was then closed interrupted 5-0 nylon sutures.  Sterile  compressive dressing and splint was applied.  The patient tolerated the  procedure well and was taken to the recovery room for observation in  satisfactory condition.  She is discharged home to return to [facility] [place] in 1 week on Vicodin.          ______________________________  Cindee Salt, M.D.    GK/MEDQ  D:  12/30/2006  T:  12/30/2006  Job:  [id number]   cc:   Dario Guardian, M.D.

## 2011-02-13 NOTE — H&P (Signed)
[place]. [facility]  Patient:    Lacey [name], Lacey [name]                   MRN: [id number] Adm. Date:  [id number] Attending:  Coletta Memos                         History and Physical  CHIEF COMPLAINT:  Weakness, lower extremities, pain around shoulder blades, numbness in fingers and feet.  INDICATION:  The patient is a patient of Dr. Jomarie Longs D. Sterns on whom he did an anterior cervical diskectomy and fusion at C6-7 in 1998.  The patient did quite well after that procedure until approximately three weeks ago.  She started experiencing numbness in her fingers and hands and also in her feet; she also started to feel as if she were getting weak in her legs.  She had pain associated with this which was going along the shoulders and in spots along the lumbar region.  She felt numbness in the area of her bra, both posteriorly and anteriorly; she also had some pain anteriorly in the middle of her chest which she said felt like a tightness and which was most noticeable at night.  She took a trip to [place] and does not feel that that contributed in any way to her decline.  She was seen in Dr. Casimiro Needle B. Werts office, who is her primary care physician, earlier this week on Tuesday.  At that time, I was called because the patient was supposedly having pain everywhere. Weakness was present, though not mentioned to me.  The patient feels that she is worse now than she was on Tuesday.  She has taken two falls this morning, both in the kitchen, bruising her lower extremities; they simply gave out on her.  She has had problems with hot and cold and ______ things just are not right.  REVIEW OF SYSTEMS:  The patients review of systems is positive for balance problems, nasal congestion, sinus headaches, chest pain, hypertension, leg pain, arm weakness, leg weakness, back pain, neck pain, nausea.  She denies constitutional, eye, respiratory, genitourinary, skin,  neurological, psychiatric, endocrine, hematologic and allergic problems.  CURRENT MEDICATIONS:  Birth control, vitamins D, A and E, calcium, Vicodin for pain, Orudis and Nexium.  ALLERGIES:  She has an allergy to PENICILLIN.  FAMILY HISTORY:  Positive for uterine cancer and lung cancer.  PAST SURGICAL HISTORY:  She had dental surgery in the 1980s and exploratory surgery in the 1970s in her abdomen.  SOCIAL HISTORY:  She does not smoke but does drink socially.  She is 5 feet 1 inch tall, weighing 165 pounds.  PHYSICAL EXAMINATION  VITAL SIGNS:  Pulse on examination is 100.  NEUROLOGIC:  She is alert, oriented and appropriate, answering all questions appropriately.  Cranial nerve exam is normal.  She mentions numbness in the tongue but I am unable to assess that.  She has a positive Romberg test and has difficulty with her gait.  She cannot toe walk.  She cannot heel walk. She cannot take a 14-inch step with either leg.  She has decreased vibratory sense in the lower extremities; normal in her upper extremities.  The decreased vibratory sense specifically is decreased at the great toes bilaterally.  She has 3+ reflexes at the biceps, triceps, brachioradialis and at the knees; not elicited at the ankles.  She has no clonus.  Toes are downgoing to plantar stimulation.  She has  no Hoffmanns sign.  She has decreased pinprick along the anterior aspects of the feet, both on the dorsum and plantar surfaces, and this is bilateral.  NECK:  She has no cervical masses or bruits.  LUNGS:  Lung fields are clear.  HEART:  Regular rhythm and rate.  No murmurs or rubs.  IMAGING STUDIES:  On MRI, she had some cervical spondylosis at C3-4, 4-5 and she has a good-looking fusion at C6-7.  There is no significant spinal cord compression and no abnormal cord signal.  Thoracic spinal MRI shows no abnormalities in her thoracic spinal cord.  DIAGNOSES 1. Lower extremity weakness. 2. Decrease  peripheral sensation.  PLAN:  The patient will be admitted to the hospital tonight for further workup of her lower extremity weakness.  I am very concerned that she is progressively getting weaker and is as weak as she is right now.  She has only 4/5 strength throughout the upper and lower extremities.  She has normal muscle tone and bulk and certainly is not hypertonic.  I will obtain a neurology consult and then also try to arrange for imaging of the lumbar region.  DD:  04/30/00 TD:  05/01/00 Job: [id number]

## 2011-02-13 NOTE — Op Note (Signed)
NAMEANNIA, GOMM            ACCOUNT NO.:  [id number]   MEDICAL RECORD NO.:  [id number]          PATIENT TYPE:  AMB   LOCATION:  ENDO                         FACILITY:  [facility]   PHYSICIAN:  Graylin Shiver, M.D.   DATE OF BIRTH:  1956/05/19   DATE OF PROCEDURE:  03/19/2005  DATE OF DISCHARGE:                                 OPERATIVE REPORT   PROCEDURE:  Colonoscopy.   INDICATIONS FOR PROCEDURE:  Screening.   Informed consent was obtained after explanation of the risks of bleeding,  infection and perforation.   PREMEDICATION:  Fentanyl 100 mcg IV and Versed 8.5 mg IV.   DESCRIPTION OF PROCEDURE:  With the patient in the left lateral decubitus  position, a rectal exam was performed. No masses were felt. The Olympus  colonoscope was inserted into the rectum and advanced around the colon to  the cecum. Cecal landmarks were identified. The cecum and ascending colon  were normal. The transverse colon was normal. The descending colon, sigmoid  and rectum were normal. She tolerated the procedure well without  complications.   IMPRESSION:  Normal colonoscopy to the cecum.   I would recommend a followup screening colonoscopy again in 10 years.       SFG/MEDQ  D:  03/19/2005  T:  03/19/2005  Job:  [id number]   cc:   Dario Guardian, M.D.  [address]  [place]  [place] [address]  Fax: [phone]

## 2011-02-13 NOTE — Procedures (Signed)
[place]. [facility]  Patient:    Lacey [name], Lacey [name]                   MRN: [id number] Proc. Date: 04/30/00 Adm. Date:  [id number] Attending:  Coletta Memos                           Procedure Report  DATE OF BIRTH:  1956-01-22  PROCEDURE NOTE:  The patient was prepped and draped in the left lateral decubitus position using Betadine and 1% Xylocaine.  The L4-5 interspace was entered without difficulty.  The opening pressure was 180 mm of water, and clear color CSF was obtained and sent for studies, including protein, glucose, cell count, differential, IgG, and oligochromal IgG.  The patient tolerated the procedure well. DD:  04/30/00 TD:  05/03/00 Job: [id number]

## 2011-02-13 NOTE — Discharge Summary (Signed)
[place]. [facility]  Patient:    Lacey [name], Lacey [name]                   MRN: [id number] Adm. Date:  [id number] Disc. Date: [id number] Attending:  Coletta Memos CC:         Charlaine Dalton. Sherene Sires, M.D. [facility]  Kyle L. Franky Macho, M.D.   Discharge Summary  DATE OF BIRTH:  [birth year]  REASON FOR ADMISSION:  This 55 year old, right-handed, white married female was admitted on April 30, 2000 for evaluation of numbness in her lower lip, tongue, extremities, and weakness.  HISTORY OF PRESENT ILLNESS:  Mrs. Zelenak has a past history of neck and left arm pain and underwent anterior cervical diskectomy in 1998 at the C6-C7 level by Dr. Danae Orleans. Venetia Maxon, a neurosurgeon in [place], [place].  She had done extremely well.  In April of 2001, she had four days of nausea, vomiting, and diarrhea, without associated fever, the cause of which was not known.  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  On April 17, 2000, while going to [place] by airplane, she noted pain occurring in her shoulders and numbness and tingling in her hand which while in [place], progressed to involve numbness and tingling in her feet and legs.  She [place] approximately [place] from civilization [place] and did not see a physician.  She returned on April 26, 2000 and was seen at her personal physicians office by Dr. Kriste Basque who was covering for Dr. Sherene Sires.  An MRI of the cervical spine was obtained, and she was referred to Dr. Franky Macho who admitted her on April 30, 2000 because of weakness.  She had had numbness in her feet and legs.  She had had numbness in her lower lip and tongue region.  She had had weakness in her legs with some weakness in her hands.  She had noted no neck pain radiating to her arms and no back pain radiating into her leg.  She had  no associated bowel or bladder incontinence.  PAST MEDICAL HISTORY:  Significant for the anterior cervical diskectomy with cervical fusion at C6-C7 in 1998.  This probably involved C5-C6 as well.  MEDICATIONS:  Birth control pills and Vicodin.  SOCIAL HISTORY:  She was educated to the 12th grade and also [employer].  She works for [employer].  She does not drink alcohol or smoke cigarettes.  ALLERGIES:  She had a history of allergy to PENICILLIN.  FAMILY HISTORY:  Details of her family history have been dictated elsewhere.  PHYSICAL EXAMINATION:  VITAL SIGNS:  Blood pressure in the right and left arm of 190/80, heart rate 104.  She is afebrile.  Respiratory rates 20.  MENTAL STATUS:  She was alert and oriented x 3 and follows three-step commands.  NEUROLOGIC:  Her cranial nerve examination was normal including sternocleidomastoid and trapezius testing.  There was no objective sensory loss in the perioral region or tongue.  Her motor examination showed good strength in the upper extremities except for definite weakness in the left triceps and left flexor carpal ulnaris with 4+/5 range strength in the intrinsic muscles of the hands.  She had possibly some slight weakness in the deltoids bilaterally.  In the lower extremities, she had definite weakness in the 3/5-4/5  range in the iliac psoas bilaterally with 4/5 in the quadriceps to femoris, and had 3/5 distally in her lower extremities.  She had decreased vibration sense to her mid calf region.  Decreased pinprick to her mid calf region, and intact joint position in the upper and lower extremities.  Deep tendon reflexes were 3+ including the knees, except for decreased left triceps jerk and absent ankle jerks, plantar responses were downgoing.  Her general examination was dictated by Dr. Franky Macho, which included that she had no significant general physical abnormalities apparently.  LABORATORY DATA:  CSF approaching at 66 with a  CSF glucose of 67.  There were no red blood cells and no white blood cells present.  A vitamin B12 level was 1324, TSH was 1.207.  A 24-hour urine for heavy metals was still pending.  A mono spot test was negative.  A CSF oligoclonal and IgG study is pending at this time.  A CK was 96.  Initial hemoglobin was 15.0, hematocrit 41.2, white blood cell count was 10,700.  Platelet count was 385,000.  Sed rate was 6. Glucose was 119, BUN 14, creatinine .9, calcium 9.0, total protein 7.0, albumin 3.6, AST 31, ALT 34, ALP 48, total bilirubin .5, sodium 136, potassium 3.5, chloride 104, CO2 content 24.  Her 24-hour urine for heavy metals is still pending.  Other studies like chest x-ray were not performed in the hospital.  An MRI study as an outpatient had been brought in which showed no significant cord abnormalities.  There was evidence of spondylosis at C3-C4 at C4-C5 and C5-C6.  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  She underwent EMG and nerve conduction studies by Dr. Anne Hahn on May 01, 2000, which showed prolongation of the F waves.  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  Spinal tap performed the night of April 30, 2000, when she came into the hospital, revealed clear, colorless fluid.  She was tense, and the pressure was slightly elevated at 180 mm of water.  She did have elevated CSF protein.  In the hospital, she stabilized without further worsening.  She did have some complaints of lower back pain. She did have elevated blood pressure on admission and Normodyne 100 mg p.o. b.i.d. was started.  She was placed on Neurontin 300 mg p.o. b.i.d. as well for pain and treated with laxative of choice.  Serum protein electrophoresis is still pending at the time of discharge.  ______ and  IgG.  She was seen by  rehab services and noted to be stable and to be able to use her walker back and forth to her bed.  IMPRESSION: 1. Diffuse proximal and distal lower extremity greater than upper extremity    weakness, most likely secondary to Guillain-barre, code ______ . 2. History of cervical spondylosis and degenerative disk disease status post    surgery to C6-C7, code 732.6. 3. Hypertension, code 796.0.  PLAN:  The plan at this time is to discharge the patient with PT and OT, thigh-high TED stockings, Normodyne 100 mg p.o. b.i.d., Vicodin 1 q.4-6h. p.r.n. pain, and to return to the office in one week for follow-up evaluation. She is discharged improved from her prehospital status with considerations of IV, IG as an outpatient should her clinical situation deteriorate. DD:  05/04/00 TD:  05/05/00 Job: [id number]

## 2011-02-13 NOTE — H&P (Signed)
[place]  Patient:    Lacey [name], Lacey [name]                   MRN: [id number] Adm. 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  HISTORY OF PRESENT ILLNESS: Ms. Brickner has a history of four days of nausea and vomiting with diarrhea in April 2001.  This was not associated with fever. The cause of that illness was uncertain.  In July 2001 she took a yellow fever shot and a tetanus shot and hepatitis shot in order to go to [place].  On April 17, 2000 on the flight to [place] she noted numbness occurring in her hands and in her feet, and some pain across her shoulders.  While in [place] she noted weakness in her legs proximally and some weakness in her hands distally.  She returned to [place], [place] and was seen by Dr. Wanita Chamberlain, covering for Dr. Sandrea Hughs, who ordered an MRI study of the cervical region because of a known history of cervical radiculopathy.  Following this study she was sent to Dr. Franky Macho, who admitted her to [facility].  There it was thought she most likely had Guillain-Barre because of absence of ankle jerks and significant proximal as well as distal weakness in her lower extremities, weakness in her hands bilaterally, and some weakness in the left C7 dermatomal distribution.  CSF evaluation included a protein of 66 and a CSF glucose of 67, there were no red blood cells and no white blood cells present. Her EMG and nerve conduction study was compatible with a diagnosis of Guillain-Barre.  A CPK was 96, hemoglobin was 15.0, hematocrit 41.2, WBC 10,700, platelet count  385,000.  A sedimentation rate was 6.  Glucose was 119, BUN 14, creatinine 0.9.  Calcium was 9.0, total protein 7.0, albumin 3.6, AST 31, ALT 34, ALP 48, total bilirubin 0.5.  Sodium 136, potassium 3.5, chloride 104, CO2 content 24.  A 24 hour urine for heavy metals has not returned at this time.  Review of her MRI study as an outpatient showed evidence of spondylosis at C3-4, C4-5, and C5-6.  It was felt the patient had Guillain-Barre but because of her debility and lack of progression in the hospital she was discharged on May 04, 2000.  At home she has been seen by a physical therapist once.  She has noted difficulty with constipation.  She has had no difficulty with loss of bowel or bladder control.  Over the last 24 hours she has had more lower back and shoulder pain and has noted recurrent falls.  She has had no chewing abnormalities, significant difficulty swallowing, or shortness of breath.  She comes in by wheelchair for further evaluation.  PAST SURGICAL HISTORY: Anterior cervical diskectomy with fusion at C6-7 in 1998 for left cervical radiculopathy.  CURRENT MEDICATIONS:  1. Birth control pills.  2. Vicodin.  3. Multivitamin.  4. Calcium.  5. Nasacort.  6. Normodyne 100 mg b.i.d.  PAST MEDICAL HISTORY: Hypertension discovered on recent hospitalization.  ALLERGIES: PENICILLIN.  SOCIAL HISTORY: She is married and is happy with life.  She has no children. She drinks occasional caffeine and occasionally drinks alcohol.  She has never used tobacco or drugs.  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  FAMILY HISTORY: Dictated elsewhere.  There is a history of stroke in a grandmother.  PHYSICAL EXAMINATION:  GENERAL: Well-developed, pleasant white female in no acute distress, who comes in by wheelchair.  VITAL SIGNS: Blood pressure in the right arm is 160 and left arm 160, heart rate 72 and regular.  NECK: No carotid or  supraclavicular bruits heard.  Neck flexion and extension maneuvers revealed no significant abnormalities.  NEUROLOGIC: Mental status showed she was alert and oriented x 3 and followed one, two, and three steps commands.  Cranial nerve examination revealed good strength in the sternocleidomastoid and trapezius muscle groups.  Visual fields full to count finger examination.  Discs flat with spontaneous venous pulsations seen.  EOMI with negative red lens testing.  Corneals were present. Facial sensation was equal and there was no facial motor asymmetry.  Hearing was intact.  Air conduction was greater than bone conduction.  Tongue was midline.  Uvula was midline.  Gags were present.  Sternocleidomastoid and trapezius testing were normal.  Motor examination revealed weakness in the intrinsic muscles of the hand distally and left flexor carpi ulnaris and triceps.  She had some weakness in the iliopsoas bilaterally in the 3/5 range with 4/5 quadriceps femoris but had 1-2/5 strength in the hamstrings and 3/5 strength distally in the peronei, posterior tibialis, and gastrocnemius muscle groups in the lower extremities.  She had stocking distribution decreased pinprick to the mid calf region, some decreased two point discrimination in her hand, had 2+ reflexes in the upper extremities except for definite decrease in left triceps jerk versus the right.  She had absent ankle jerks and absent knee jerks.  She had an outstretched hand and arm tremor.  Plantar responses were bilaterally downgoing.  HEENT: Tympanic membranes clear.  Mouth in dentition in good repair.  LUNGS: Clear to auscultation.  HEART: No murmurs.  ABDOMEN: Bowel sounds unremarkable.  IMPRESSION:  1. 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  2. History of cervical spondylosis with left C7 radiculopathy, code 722.6     and 353.2.  3. Hypertension, code 796.2.  4. Obesity, code 278.01.  PLAN: The plan at this time is to admit  her for consideration of IVIG therapy in view of her continued falls and place her on prophylaxis for pulmonary emboli. DD:  05/11/00 TD:  05/12/00  Job: [id number]

## 2011-02-13 NOTE — Consult Note (Signed)
[place]  Patient:    Lacey [name], Lacey [name]                   MRN: [id number] Proc. Date: 04/30/00 Adm. Date:  [id number] Attending:  Coletta Memos CC:         Charlaine Dalton. Sherene Sires, M.D. [facility]  Kyle L. Franky Macho, M.D.   Consultation Report  HISTORY OF PRESENT ILLNESS:  This 55 year old right-handed white married female is seen in consultation at the request of Kyle L. Cabbell, M.D. to evaluate new onset weakness and numbness.  HISTORY OF PRESENT ILLNESS:  Ms. Consoli has a past history of neck and left arm pain treated with anterior cervical diskectomy in 1998 at the C6-7 level by Danae Orleans. Venetia Maxon, M.D.  She had been in good health her entire life until April of this year, when on April 20, she developed nausea, vomiting, with diarrhea for approximately four days.  The cause of this was not known.  On April 17, 2000, she went to [place] by airplane.  She was there to build a church.  On the flight, she noted some pain in her shoulders and occasional tingling in her hands.  She returned on April 26, 2000, and at that point noted numbness in her hands, feet, and some pain across her shoulders and even in her legs.  Over the last few days since returning to the [place] she has noted her legs are giving way and she is having difficulty with falling spells. There has been no bowel or bladder incontinence.  She denies any pain in her neck radiating to her arms or back pain radiating to her legs.  She has had no head or neck trauma.  PAST MEDICAL HISTORY:  Significant for the anterior cervical diskectomy with cervical fusion at C6-7 in 1998, I think it is C6-7 and probably C5-6 as well.  MEDICATIONS: 1. Birth control pills. 2. Vicodin.  SOCIAL HISTORY:  She was educated to the 12th grade, attending also [employer].  ALLERGIES:  PENICILLIN.  She does not drink alcohol or smoke cigarettes.  She works at the [employer].  PHYSICAL  EXAMINATION:  GENERAL:  Revealed a well-developed, pleasant white female in no acute distress.  VITAL SIGNS:  Blood pressure right and left arm 190/80 with a heart rate of 104.  MENTAL STATUS EXAMINATION:  She was alert and oriented x 3, followed one, two, and three-step commands.  Cranial nerve examination revealed visual fields to be full, discs were flat, the extraocular movements were full.  The corneals were present.  There was no seventh nerve palsy.  Hearing was intact.  Air conduction was greater than bone conduction.  Tongue was midline, uvula was midline.  Gags were present.  The sternocleidomastoid and trapezius testing were normal.  Motor examination revealed good strength in the upper extremities except for definite weakness in the left triceps and flexor carpi ulnaris in the 4+ out of 5 range and some weakness bilaterally distally in the hands.  She had some slight weakness in the deltoids bilaterally.  In the lower extremities she had significant weakness in the 4 out of 5 range proximally and distally with at times 3 to 4 out of 5 proximally.  She had decreased two-point discrimination in her hands, decreased pinprick to her ankles, decreased vibration to her midcalf.  The deep tendon reflexes were 3+ except for a definite decrease in the left triceps, and absent ankle jerks. Plantar responses were  downgoing.  IMPRESSION: 1. Suspect Guillain-Barre syndrome.  357.0 2. History of cervical spondylosis and degenerative disk disease, status post    surgery at the C6-7 level.  722.6 3. Hypertension.  796.0  PLAN:  To obtain a spinal tap for further evaluation and studies to rule out peripheral neuropathic disease. DD:  04/30/00 TD:  05/01/00 Job: [id number]

## 2011-05-12 ENCOUNTER — Other Ambulatory Visit: Payer: BC Managed Care – PPO

## 2011-05-14 ENCOUNTER — Ambulatory Visit
Admission: RE | Admit: 2011-05-14 | Discharge: 2011-05-14 | Disposition: A | Discharge status: Home / Self Care | Payer: BC Managed Care – PPO | Source: Ambulatory Visit | Attending: General Surgery | Admitting: General Surgery

## 2011-05-14 DIAGNOSIS — E041 Nontoxic single thyroid nodule: Secondary | ICD-10-CM

## 2011-06-04 ENCOUNTER — Ambulatory Visit (INDEPENDENT_AMBULATORY_CARE_PROVIDER_SITE_OTHER): Payer: BC Managed Care – PPO | Admitting: General Surgery

## 2011-06-04 ENCOUNTER — Other Ambulatory Visit: Payer: Self-pay | Admitting: Hematology & Oncology

## 2011-06-04 ENCOUNTER — Encounter (HOSPITAL_BASED_OUTPATIENT_CLINIC_OR_DEPARTMENT_OTHER): Payer: BC Managed Care – PPO | Admitting: Hematology & Oncology

## 2011-06-04 DIAGNOSIS — E049 Nontoxic goiter, unspecified: Secondary | ICD-10-CM

## 2011-06-04 DIAGNOSIS — Z9484 Stem cells transplant status: Secondary | ICD-10-CM

## 2011-06-04 DIAGNOSIS — C8190 Hodgkin lymphoma, unspecified, unspecified site: Secondary | ICD-10-CM

## 2011-06-04 LAB — CBC WITH DIFFERENTIAL (CANCER CENTER ONLY)
BASO%: 0.6 % (ref 0.0–2.0)
EOS%: 3.1 % (ref 0.0–7.0)
HCT: 41.4 % (ref 34.8–46.6)
LYMPH%: 36.5 % (ref 14.0–48.0)
MCHC: 34.5 g/dL (ref 32.0–36.0)
MCV: 100 fL (ref 81–101)
MONO%: 14.5 % — ABNORMAL HIGH (ref 0.0–13.0)
NEUT%: 45.3 % (ref 39.6–80.0)
RDW: 13.6 % (ref 11.1–15.7)

## 2011-06-05 ENCOUNTER — Other Ambulatory Visit: Payer: Self-pay | Admitting: Hematology & Oncology

## 2011-06-05 DIAGNOSIS — R05 Cough: Secondary | ICD-10-CM

## 2011-06-05 LAB — COMPREHENSIVE METABOLIC PANEL
ALT: 24 U/L (ref 0–35)
AST: 23 U/L (ref 0–37)
Creatinine, Ser: 1.11 mg/dL — ABNORMAL HIGH (ref 0.50–1.10)
Total Bilirubin: 0.4 mg/dL (ref 0.3–1.2)

## 2011-06-05 LAB — LACTATE DEHYDROGENASE: LDH: 206 U/L (ref 94–250)

## 2011-06-05 LAB — VITAMIN D 25 HYDROXY (VIT D DEFICIENCY, FRACTURES): Vit D, 25-Hydroxy: 38 ng/mL (ref 30–89)

## 2011-06-09 ENCOUNTER — Ambulatory Visit (HOSPITAL_BASED_OUTPATIENT_CLINIC_OR_DEPARTMENT_OTHER)
Admission: RE | Admit: 2011-06-09 | Discharge: 2011-06-09 | Disposition: A | Discharge status: Home / Self Care | Payer: BC Managed Care – PPO | Source: Ambulatory Visit | Attending: Hematology & Oncology | Admitting: Hematology & Oncology

## 2011-06-09 DIAGNOSIS — R05 Cough: Secondary | ICD-10-CM

## 2011-06-09 DIAGNOSIS — R059 Cough, unspecified: Secondary | ICD-10-CM | POA: Insufficient documentation

## 2011-06-09 DIAGNOSIS — R0602 Shortness of breath: Secondary | ICD-10-CM

## 2011-06-22 ENCOUNTER — Encounter (INDEPENDENT_AMBULATORY_CARE_PROVIDER_SITE_OTHER): Payer: Self-pay | Admitting: General Surgery

## 2011-06-22 LAB — COMPREHENSIVE METABOLIC PANEL
ALT: 29
Albumin: 3.4 — ABNORMAL LOW
Alkaline Phosphatase: 63
BUN: 13
Chloride: 107
Glucose, Bld: 102 — ABNORMAL HIGH
Potassium: 3.9
Sodium: 138
Total Bilirubin: 0.6
Total Protein: 6.8

## 2011-06-22 LAB — CBC
HCT: 32.5 — ABNORMAL LOW
HCT: 34.8 — ABNORMAL LOW
Hemoglobin: 11.4 — ABNORMAL LOW
Hemoglobin: 11.8 — ABNORMAL LOW
MCHC: 35.1
MCV: 84.8
MCV: 85.9
Platelets: 201
Platelets: 247
RBC: 3.59 — ABNORMAL LOW
RBC: 3.85 — ABNORMAL LOW
RDW: 17.5 — ABNORMAL HIGH
RDW: 17.8 — ABNORMAL HIGH
WBC: 2.6 — ABNORMAL LOW

## 2011-06-22 LAB — CULTURE, RESPIRATORY W GRAM STAIN

## 2011-06-22 LAB — CULTURE, BLOOD (ROUTINE X 2): Culture: NO GROWTH

## 2011-06-22 LAB — PROTIME-INR
INR: 1.2
INR: 1.7 — ABNORMAL HIGH
INR: 2.3 — ABNORMAL HIGH
INR: 2.4 — ABNORMAL HIGH
Prothrombin Time: 15.9 — ABNORMAL HIGH
Prothrombin Time: 20.4 — ABNORMAL HIGH

## 2011-06-22 LAB — DIFFERENTIAL
Basophils Absolute: 0
Eosinophils Absolute: 0.3
Lymphocytes Relative: 7 — ABNORMAL LOW
Lymphs Abs: 0.3 — ABNORMAL LOW
Neutro Abs: 2.6

## 2011-06-22 LAB — URINALYSIS, ROUTINE W REFLEX MICROSCOPIC
Bilirubin Urine: NEGATIVE
Glucose, UA: NEGATIVE
Hgb urine dipstick: NEGATIVE
Specific Gravity, Urine: 1.013
Urobilinogen, UA: 0.2
pH: 6.5

## 2011-06-22 LAB — EXPECTORATED SPUTUM ASSESSMENT W GRAM STAIN, RFLX TO RESP C

## 2011-06-22 LAB — BASIC METABOLIC PANEL
CO2: 23
Calcium: 8.6
GFR calc Af Amer: >60
Glucose, Bld: 85
Potassium: 3.9
Sodium: 134 — ABNORMAL LOW

## 2011-06-23 ENCOUNTER — Ambulatory Visit (INDEPENDENT_AMBULATORY_CARE_PROVIDER_SITE_OTHER): Payer: BC Managed Care – PPO | Admitting: General Surgery

## 2011-06-26 ENCOUNTER — Emergency Department (HOSPITAL_COMMUNITY): Payer: BC Managed Care – PPO

## 2011-06-26 ENCOUNTER — Emergency Department (HOSPITAL_COMMUNITY)
Admission: EM | Admit: 2011-06-26 | Discharge: 2011-06-26 | Disposition: A | Discharge status: Home / Self Care | Payer: BC Managed Care – PPO | Attending: Emergency Medicine | Admitting: Emergency Medicine

## 2011-06-26 DIAGNOSIS — E039 Hypothyroidism, unspecified: Secondary | ICD-10-CM | POA: Insufficient documentation

## 2011-06-26 DIAGNOSIS — IMO0001 Reserved for inherently not codable concepts without codable children: Secondary | ICD-10-CM | POA: Insufficient documentation

## 2011-06-26 DIAGNOSIS — R112 Nausea with vomiting, unspecified: Secondary | ICD-10-CM | POA: Insufficient documentation

## 2011-06-26 LAB — COMPREHENSIVE METABOLIC PANEL
ALT: 46 U/L — ABNORMAL HIGH (ref 0–35)
CO2: 22 mEq/L (ref 19–32)
Calcium: 10.1 mg/dL (ref 8.4–10.5)
Chloride: 107 mEq/L (ref 96–112)
Creatinine, Ser: 1.11 mg/dL — ABNORMAL HIGH (ref 0.50–1.10)
GFR calc Af Amer: >60 mL/min (ref >60)
GFR calc non Af Amer: 51 mL/min — ABNORMAL LOW (ref >60)
Glucose, Bld: 109 mg/dL — ABNORMAL HIGH (ref 70–99)
Total Bilirubin: 0.8 mg/dL (ref 0.3–1.2)

## 2011-06-26 LAB — URINALYSIS, ROUTINE W REFLEX MICROSCOPIC
Leukocytes, UA: NEGATIVE
Nitrite: NEGATIVE
Protein, ur: 100 mg/dL — AB
Urobilinogen, UA: 0.2 mg/dL (ref 0.0–1.0)

## 2011-06-26 LAB — CBC
MCH: 33.3 pg (ref 26.0–34.0)
MCHC: 34.4 g/dL (ref 30.0–36.0)
Platelets: 139 10*3/uL — ABNORMAL LOW (ref 150–400)
RBC: 4.77 MIL/uL (ref 3.87–5.11)
RDW: 13.2 % (ref 11.5–15.5)

## 2011-06-26 LAB — GLUCOSE, CAPILLARY: Glucose-Capillary: 94

## 2011-06-26 LAB — DIFFERENTIAL
Basophils Relative: 1 % (ref 0–1)
Eosinophils Absolute: 0 10*3/uL (ref 0.0–0.7)
Eosinophils Relative: 1 % (ref 0–5)
Monocytes Absolute: 0.5 10*3/uL (ref 0.1–1.0)
Monocytes Relative: 10 % (ref 3–12)
Neutrophils Relative %: 54 % (ref 43–77)

## 2011-06-28 LAB — URINE CULTURE

## 2011-06-30 LAB — GLUCOSE, CAPILLARY: Glucose-Capillary: 91

## 2011-07-02 LAB — COMPREHENSIVE METABOLIC PANEL
ALT: 30 U/L (ref 0–35)
AST: 27 U/L (ref 0–37)
Albumin: 3.6 g/dL (ref 3.5–5.2)
Alkaline Phosphatase: 110 U/L (ref 39–117)
BUN: 12 mg/dL (ref 6–23)
CO2: 23 mEq/L (ref 19–32)
Calcium: 9.2 mg/dL (ref 8.4–10.5)
Chloride: 111 mEq/L (ref 96–112)
Creatinine, Ser: 0.68 mg/dL (ref 0.4–1.2)
GFR calc Af Amer: >60 mL/min (ref >60)
GFR calc non Af Amer: >60 mL/min (ref >60)
Glucose, Bld: 99 mg/dL (ref 70–99)
Potassium: 4.5 mEq/L (ref 3.5–5.1)
Sodium: 139 mEq/L (ref 135–145)
Total Bilirubin: 0.5 mg/dL (ref 0.3–1.2)
Total Protein: 7.1 g/dL (ref 6.0–8.3)

## 2011-07-02 LAB — PROTIME-INR: Prothrombin Time: 13.5 seconds (ref 11.6–15.2)

## 2011-07-02 LAB — URINALYSIS, ROUTINE W REFLEX MICROSCOPIC
Bilirubin Urine: NEGATIVE
Glucose, UA: NEGATIVE mg/dL
Hgb urine dipstick: NEGATIVE
Ketones, ur: NEGATIVE mg/dL
Protein, ur: NEGATIVE mg/dL

## 2011-07-02 LAB — CBC
HCT: 40.8 % (ref 36.0–46.0)
Hemoglobin: 13.2 g/dL (ref 12.0–15.0)
MCHC: 32.5 g/dL (ref 30.0–36.0)
RBC: 4.63 MIL/uL (ref 3.87–5.11)

## 2011-07-02 LAB — ABO/RH: ABO/RH(D): O POS

## 2011-07-10 LAB — COMPREHENSIVE METABOLIC PANEL
Albumin: 2.3 — ABNORMAL LOW
Alkaline Phosphatase: 88
BUN: 13
Chloride: 106
Glucose, Bld: 129 — ABNORMAL HIGH
Potassium: 5
Total Bilirubin: 0.4

## 2011-07-10 LAB — CBC
HCT: 29.7 — ABNORMAL LOW
Hemoglobin: 9.8 — ABNORMAL LOW
Platelets: 583 — ABNORMAL HIGH
WBC: 9.6

## 2011-07-10 LAB — LACTATE DEHYDROGENASE: LDH: 248

## 2011-07-13 LAB — CBC
HCT: 35.5 — ABNORMAL LOW
MCV: 78.8
Platelets: 560 — ABNORMAL HIGH
RDW: 15.6 — ABNORMAL HIGH

## 2011-09-02 ENCOUNTER — Ambulatory Visit
Admission: RE | Admit: 2011-09-02 | Discharge: 2011-09-02 | Disposition: A | Discharge status: Home / Self Care | Payer: BC Managed Care – PPO | Source: Ambulatory Visit | Attending: *Deleted | Admitting: *Deleted

## 2011-09-02 ENCOUNTER — Other Ambulatory Visit: Payer: Self-pay | Admitting: *Deleted

## 2011-09-02 DIAGNOSIS — T189XXA Foreign body of alimentary tract, part unspecified, initial encounter: Secondary | ICD-10-CM

## 2011-11-27 ENCOUNTER — Emergency Department (HOSPITAL_COMMUNITY)
Admission: EM | Admit: 2011-11-27 | Discharge: 2011-11-27 | Disposition: A | Discharge status: Home / Self Care | Payer: BC Managed Care – PPO | Attending: Emergency Medicine | Admitting: Emergency Medicine

## 2011-11-27 ENCOUNTER — Encounter (HOSPITAL_COMMUNITY): Payer: Self-pay | Admitting: Family Medicine

## 2011-11-27 ENCOUNTER — Emergency Department (HOSPITAL_COMMUNITY): Payer: BC Managed Care – PPO

## 2011-11-27 DIAGNOSIS — R490 Dysphonia: Secondary | ICD-10-CM | POA: Insufficient documentation

## 2011-11-27 DIAGNOSIS — N390 Urinary tract infection, site not specified: Secondary | ICD-10-CM | POA: Insufficient documentation

## 2011-11-27 DIAGNOSIS — R5381 Other malaise: Secondary | ICD-10-CM | POA: Insufficient documentation

## 2011-11-27 DIAGNOSIS — E860 Dehydration: Secondary | ICD-10-CM | POA: Insufficient documentation

## 2011-11-27 DIAGNOSIS — J3489 Other specified disorders of nose and nasal sinuses: Secondary | ICD-10-CM | POA: Insufficient documentation

## 2011-11-27 DIAGNOSIS — C8190 Hodgkin lymphoma, unspecified, unspecified site: Secondary | ICD-10-CM | POA: Insufficient documentation

## 2011-11-27 DIAGNOSIS — Z79899 Other long term (current) drug therapy: Secondary | ICD-10-CM | POA: Insufficient documentation

## 2011-11-27 DIAGNOSIS — R05 Cough: Secondary | ICD-10-CM | POA: Insufficient documentation

## 2011-11-27 DIAGNOSIS — R1032 Left lower quadrant pain: Secondary | ICD-10-CM | POA: Insufficient documentation

## 2011-11-27 DIAGNOSIS — IMO0001 Reserved for inherently not codable concepts without codable children: Secondary | ICD-10-CM | POA: Insufficient documentation

## 2011-11-27 DIAGNOSIS — R059 Cough, unspecified: Secondary | ICD-10-CM | POA: Insufficient documentation

## 2011-11-27 DIAGNOSIS — R509 Fever, unspecified: Secondary | ICD-10-CM | POA: Insufficient documentation

## 2011-11-27 DIAGNOSIS — R197 Diarrhea, unspecified: Secondary | ICD-10-CM

## 2011-11-27 DIAGNOSIS — R112 Nausea with vomiting, unspecified: Secondary | ICD-10-CM | POA: Insufficient documentation

## 2011-11-27 DIAGNOSIS — J189 Pneumonia, unspecified organism: Secondary | ICD-10-CM | POA: Insufficient documentation

## 2011-11-27 LAB — COMPREHENSIVE METABOLIC PANEL
BUN: 17 mg/dL (ref 6–23)
CO2: 21 mEq/L (ref 19–32)
Calcium: 9.8 mg/dL (ref 8.4–10.5)
Creatinine, Ser: 1.45 mg/dL — ABNORMAL HIGH (ref 0.50–1.10)
GFR calc Af Amer: 45 mL/min — ABNORMAL LOW (ref >90)
GFR calc non Af Amer: 39 mL/min — ABNORMAL LOW (ref >90)
Glucose, Bld: 133 mg/dL — ABNORMAL HIGH (ref 70–99)

## 2011-11-27 LAB — URINALYSIS, ROUTINE W REFLEX MICROSCOPIC
Glucose, UA: NEGATIVE mg/dL
Leukocytes, UA: NEGATIVE
Specific Gravity, Urine: 1.024 (ref 1.005–1.030)
Urobilinogen, UA: 1 mg/dL (ref 0.0–1.0)

## 2011-11-27 LAB — CBC
Hemoglobin: 14.1 g/dL (ref 12.0–15.0)
RBC: 4.31 MIL/uL (ref 3.87–5.11)
WBC: 11.6 10*3/uL — ABNORMAL HIGH (ref 4.0–10.5)

## 2011-11-27 LAB — DIFFERENTIAL
Lymphs Abs: 2 10*3/uL (ref 0.7–4.0)
Monocytes Relative: 15 % — ABNORMAL HIGH (ref 3–12)
Neutro Abs: 7.9 10*3/uL — ABNORMAL HIGH (ref 1.7–7.7)
Neutrophils Relative %: 68 % (ref 43–77)

## 2011-11-27 LAB — URINE MICROSCOPIC-ADD ON

## 2011-11-27 LAB — LIPASE, BLOOD: Lipase: 23 U/L (ref 11–59)

## 2011-11-27 MED ORDER — LEVOFLOXACIN 750 MG PO TABS: 750.0000 mg | ORAL_TABLET | Freq: Every day | ORAL | Status: AC

## 2011-11-27 MED ORDER — ONDANSETRON HCL 4 MG/2ML IJ SOLN
4.0000 mg | Freq: Once | INTRAMUSCULAR | Status: AC
  Administered 2011-11-27: 4 mg via INTRAVENOUS
  Filled 2011-11-27: qty 2

## 2011-11-27 MED ORDER — SODIUM CHLORIDE 0.9 % IV BOLUS (SEPSIS)
2000.0000 mL | Freq: Once | INTRAVENOUS | Status: AC
  Administered 2011-11-27: 2000 mL via INTRAVENOUS

## 2011-11-27 MED ORDER — SODIUM CHLORIDE 0.9 % IV SOLN: INTRAVENOUS | Status: DC

## 2011-11-27 MED ORDER — ONDANSETRON 8 MG PO TBDP: ORAL_TABLET | ORAL | Status: AC

## 2011-11-27 MED ORDER — METOCLOPRAMIDE HCL 10 MG PO TABS: 10.0000 mg | ORAL_TABLET | Freq: Four times a day (QID) | ORAL | Status: DC | PRN

## 2011-11-27 MED ORDER — METOCLOPRAMIDE HCL 5 MG/ML IJ SOLN
10.0000 mg | Freq: Once | INTRAMUSCULAR | Status: AC
  Administered 2011-11-27: 10 mg via INTRAVENOUS
  Filled 2011-11-27: qty 2

## 2011-11-27 NOTE — ED Notes (Signed)
Pt reports N/V/D x3 days with generalized body aches.

## 2011-11-27 NOTE — Discharge Instructions (Signed)
Medical conditions can also worsen, so it is also important to return immediately as directed below, or if you have other serious concerns develop. RETURN IMMEDIATELY IF you develop new shortness of breath, chest pain, fever, have difficulty moving parts of your body (new weakness, numbness, or incoordination), sudden change in speech, vision, swallowing, or understanding, faint or develop new dizziness, severe headache, become poorly responsive or have an altered mental status compared to baseline for you, new rash, abdominal pain, or bloody stools,  Return sooner also if you develop new problems for which you have not talked to your caregiver but you feel may be emergency medical conditions, or are unable to be cared for safely at home. 

## 2011-11-27 NOTE — ED Provider Notes (Signed)
History     CSN: [id number]  Arrival date & time 11/27/11  4098   First MD Initiated Contact with Patient 11/27/11 0840      Chief Complaint  Patient presents with  . Nausea  . Emesis  . Diarrhea    (Consider location/radiation/quality/duration/timing/severity/associated sxs/prior treatment) HPI This 56 year old female has a 4-5 days of cough, nasal congestion, hoarse voice, body aches, vomiting, diarrhea, several episodes of nonbloody vomiting and nonbloody diarrhea per day, she did have a fever 3 or 4 days ago when this illness started, she has had no fever in the last couple of days, she has no confusion but does feel somewhat dehydrated she has less oral intake than usual over the last few days. She has no constant abdominal pain but she does feel an occasional left lower quadrant pain but thinks she pulled a muscle there from all her coughing. She is not short of breath, confused, nor does she have a rash or severe headache. Past Medical History  Diagnosis Date  . Cancer    Hodgkin's lymphoma in remission for 3 years  Past Surgical History  Procedure Date  . Bone marrow biopsy 05/20110  . Spine surgery 1998    c-4 c 5 fusion  . Hodgkin     Family History  Problem Relation Age of Onset  . Cancer Mother   . Cancer Father     History  Substance Use Topics  . Smoking status: Never Smoker   . Smokeless tobacco: Not on file  . Alcohol Use: Yes    OB History    Grav Para Term Preterm Abortions TAB SAB Ect Mult Living                  Review of Systems  Constitutional: Positive for fever and fatigue.       10 Systems reviewed and are negative for acute change except as noted in the HPI.  HENT: Negative for congestion.   Eyes: Negative for discharge and redness.  Respiratory: Positive for cough. Negative for shortness of breath.   Cardiovascular: Negative for chest pain.  Gastrointestinal: Positive for nausea, vomiting, abdominal pain and diarrhea. Negative for  blood in stool.  Genitourinary: Negative for dysuria.  Musculoskeletal: Positive for myalgias and arthralgias. Negative for back pain.  Skin: Negative for rash.  Neurological: Positive for weakness. Negative for syncope, numbness and headaches.  Psychiatric/Behavioral:       No behavior change.    Allergies  Penicillins  Home Medications   Current Outpatient Rx  Name Route Sig Dispense Refill  . VITAMIN D3 3000 UNITS PO TABS Oral Take 2,000 Units by mouth daily.     . DULOXETINE HCL 30 MG PO CPEP Oral Take 30 mg by mouth daily.    Marland Kitchen GABAPENTIN 300 MG PO CAPS Oral Take 300 mg by mouth 3 (three) times daily.     Marland Kitchen VALACYCLOVIR HCL 1 G PO TABS Oral Take 1,000 mg by mouth 2 (two) times daily.      Marland Kitchen LEVOFLOXACIN 750 MG PO TABS Oral Take 1 tablet (750 mg total) by mouth daily. X 7 days 7 tablet 0  . METOCLOPRAMIDE HCL 10 MG PO TABS Oral Take 1 tablet (10 mg total) by mouth every 6 (six) hours as needed (nausea/headache). 6 tablet 0  . ONDANSETRON 8 MG PO TBDP  8mg  ODT q4 hours prn nausea 4 tablet 0    BP 121/62  Pulse 98  Temp(Src) 99.1 F (37.3 C) (Oral)  Resp 20  SpO2 97%  Physical Exam  Nursing note and vitals reviewed. Constitutional:       Awake, alert, nontoxic appearance.  HENT:  Head: Atraumatic.  Eyes: Right eye exhibits no discharge. Left eye exhibits no discharge.  Neck: Neck supple.  Cardiovascular: Normal rate and regular rhythm.   No murmur heard. Pulmonary/Chest: Effort normal and breath sounds normal. No respiratory distress. She has no wheezes. She has no rales. She exhibits no tenderness.  Abdominal: Soft. Bowel sounds are normal. She exhibits no mass. There is tenderness. There is no rebound and no guarding.       Minimal tenderness to the far left lateral abdomen region without tenderness to the left lower quadrant left upper quadrant or the right side of the abdomen.  No flank or CVA tenderness.  Musculoskeletal: She exhibits no tenderness.       Baseline  ROM, no obvious new focal weakness.  Neurological: She is alert.       Mental status and motor strength appears baseline for patient and situation.  Skin: No rash noted.  Psychiatric: She has a normal mood and affect.    ED Course  Procedures (including critical care time) The patient feels much better with IV fluids and antibiotics in the emergency department. She appears nontoxic. I doubt a serious bacterial illness or sepsis. Due to possible pneumonia and possible urine infection she will be started on outpatient antibiotics.Patient / Family / Caregiver informed of clinical course, understand medical decision-making process, and agree with plan. Labs Reviewed  COMPREHENSIVE METABOLIC PANEL - Abnormal; Notable for the following:    Sodium 131 (*)    Glucose, Bld 133 (*)    Creatinine, Ser 1.45 (*)    Total Protein 8.4 (*)    Alkaline Phosphatase 127 (*)    GFR calc non Af Amer 39 (*)    GFR calc Af Amer 45 (*)    All other components within normal limits  CBC - Abnormal; Notable for the following:    WBC 11.6 (*)    All other components within normal limits  DIFFERENTIAL - Abnormal; Notable for the following:    Neutro Abs 7.9 (*)    Monocytes Relative 15 (*)    Monocytes Absolute 1.8 (*)    All other components within normal limits  URINALYSIS, ROUTINE W REFLEX MICROSCOPIC - Abnormal; Notable for the following:    APPearance CLOUDY (*)    Hgb urine dipstick MODERATE (*)    Protein, ur >300 (*)    All other components within normal limits  URINE MICROSCOPIC-ADD ON - Abnormal; Notable for the following:    Squamous Epithelial / LPF FEW (*)    Bacteria, UA MANY (*)    Casts HYALINE CASTS (*)    All other components within normal limits  LIPASE, BLOOD  LAB REPORT - SCANNED   No results found.   1. Vomiting and diarrhea   2. Dehydration   3. Community acquired pneumonia   4. Urinary tract infection       MDM  I doubt any other EMC precluding discharge at this time  including, but not necessarily limited to the following:sepsis.        Hurman Horn, MD 11/29/11 [id number]

## 2011-12-01 ENCOUNTER — Telehealth: Payer: Self-pay | Admitting: Hematology & Oncology

## 2011-12-01 NOTE — Telephone Encounter (Signed)
I sch 12/29/11 apt.  i called pt but did not get an answer.  i left a message on vm of apt date/time

## 2011-12-02 ENCOUNTER — Telehealth: Payer: Self-pay | Admitting: Hematology & Oncology

## 2011-12-02 NOTE — Telephone Encounter (Signed)
Pt called and cx 12/29/11 and resch for 01/05/12

## 2011-12-29 ENCOUNTER — Other Ambulatory Visit: Payer: BC Managed Care – PPO | Admitting: Lab

## 2011-12-29 ENCOUNTER — Ambulatory Visit: Payer: BC Managed Care – PPO | Admitting: Hematology & Oncology

## 2012-01-05 ENCOUNTER — Other Ambulatory Visit (HOSPITAL_BASED_OUTPATIENT_CLINIC_OR_DEPARTMENT_OTHER): Payer: BC Managed Care – PPO | Admitting: Lab

## 2012-01-05 ENCOUNTER — Ambulatory Visit (HOSPITAL_BASED_OUTPATIENT_CLINIC_OR_DEPARTMENT_OTHER): Payer: BC Managed Care – PPO | Admitting: Hematology & Oncology

## 2012-01-05 VITALS — BP 120/82 | HR 102 | Temp 97.4°F | Ht 60.0 in | Wt 204.0 lb

## 2012-01-05 DIAGNOSIS — L670 Trichorrhexis nodosa: Secondary | ICD-10-CM

## 2012-01-05 DIAGNOSIS — K08409 Partial loss of teeth, unspecified cause, unspecified class: Secondary | ICD-10-CM

## 2012-01-05 DIAGNOSIS — M810 Age-related osteoporosis without current pathological fracture: Secondary | ICD-10-CM

## 2012-01-05 DIAGNOSIS — C8190 Hodgkin lymphoma, unspecified, unspecified site: Secondary | ICD-10-CM

## 2012-01-05 LAB — CBC WITH DIFFERENTIAL (CANCER CENTER ONLY)
BASO#: 0 10*3/uL (ref 0.0–0.2)
EOS%: 3.3 % (ref 0.0–7.0)
HGB: 12.7 g/dL (ref 11.6–15.9)
LYMPH%: 41.9 % (ref 14.0–48.0)
MCH: 32.6 pg (ref 26.0–34.0)
MCHC: 32.8 g/dL (ref 32.0–36.0)
MCV: 100 fL (ref 81–101)
MONO%: 15.7 % — ABNORMAL HIGH (ref 0.0–13.0)
NEUT#: 1.9 10*3/uL (ref 1.5–6.5)
NEUT%: 38.5 % — ABNORMAL LOW (ref 39.6–80.0)

## 2012-01-05 NOTE — Progress Notes (Signed)
CC:   Lacey [name], M.D. Lacey Schneiders, Lacey [name]  DIAGNOSIS:  History of recurrent Hodgkin disease, remission.  CURRENT THERAPY:  Observation.  INTERIM HISTORY:  Lacey [name] comes in for followup.  Lacey [name] is doing okay.  Lacey [name] had "pneumonia" about a month or so ago.  Lacey [name] was in the hospital overnight.  Lacey [name] is feeling better.  Lacey [name] unfortunately is having problems with her teeth.  Her teeth are falling out.  I am not sure if this is from her chemotherapy that Lacey [name] has had before or maybe from radiation that Lacey [name] has had before.  Lacey [name] has seen her dentist and he is doing a great job in getting her mouth back into shape.  Lacey [name] still has occasional flare-ups of her postherpetic neuralgia in the left T4 dermatome.  Lacey [name] is on Neurontin for this.  Lacey [name] has had no problem with fever.  Lacey [name] has had no change in bowel or bladder habits.  Lacey [name] has not had a mammogram in 3 years.  Lacey [name] said that Lacey [name] will get this taken care.  Lacey [name] has had no bleeding.  There has been no headache.  Lacey [name] has a little bit of a cough, but it has been more chronic.  PHYSICAL EXAMINATION:  General:  This is a well-developed, well- nourished white female in no obvious distress.  Vital Signs:  Show a temperature of 97.4, pulse 102, respiratory rate 16, blood pressure 120/82, weight is 204.  Head and Neck Exam:  Shows a normocephalic, atraumatic skull.  There are no ocular or oral lesions.  There are no palpable cervical or supraclavicular lymph nodes.  Lungs:  Clear bilaterally.  Cardiac Exam:  Slight tachycardia, but regular.  There are no murmurs, rubs, or bruits.  Abdominal Exam:  Soft with good bowel sounds.  There is no fluid wave.  Lacey [name] has no guarding or rebound tenderness.  There is no palpable hepatosplenomegaly.  Back Exam:  No tenderness over the spine, ribs, or hips.  Extremities:  Show no clubbing, cyanosis, or edema.  Neurological Exam:  Shows no focal neurological deficits.  Skin Exam:  No rashes, ecchymosis, or  petechia.  LABORATORY STUDIES:  White cell count is 4.9, hemoglobin 12.7, hematocrit 38.7, platelet count 129.  IMPRESSION:  Lacey [name] is a 56 year old white female with a history of recurrent Hodgkin disease.  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  This was done at [facility].  Lacey [name] had this back in May 2010.  Again, I see no evidence of recurrent disease.  I do not see that we need to do any scans on her for right now.  But we will plan to get her back to see [place] in another 6 months for followup.    ______________________________ Josph Macho, M.D. PRE/MEDQ  D:  01/05/2012  T:  01/05/2012  Job:  [id number]

## 2012-01-05 NOTE — Progress Notes (Signed)
This office note has been dictated.

## 2012-01-06 LAB — COMPREHENSIVE METABOLIC PANEL
AST: 18 U/L (ref 0–37)
Alkaline Phosphatase: 108 U/L (ref 39–117)
BUN: 23 mg/dL (ref 6–23)
Creatinine, Ser: 1.09 mg/dL (ref 0.50–1.10)
Glucose, Bld: 78 mg/dL (ref 70–99)
Total Bilirubin: 0.5 mg/dL (ref 0.3–1.2)

## 2012-01-06 LAB — VITAMIN D 25 HYDROXY (VIT D DEFICIENCY, FRACTURES): Vit D, 25-Hydroxy: 42 ng/mL (ref 30–89)

## 2012-01-21 ENCOUNTER — Telehealth: Payer: Self-pay | Admitting: *Deleted

## 2012-01-21 NOTE — Telephone Encounter (Signed)
Called patient on personal answering machine to let her know that her labwork was all good

## 2012-01-21 NOTE — Telephone Encounter (Signed)
Message copied by Anselm Jungling on Thu Jan 21, 2012  9:09 AM ------      Message from: Arlan Organ R      Created: Wed Jan 06, 2012  9:23 PM       Call labs are ok.  pete

## 2012-07-06 ENCOUNTER — Other Ambulatory Visit (HOSPITAL_BASED_OUTPATIENT_CLINIC_OR_DEPARTMENT_OTHER): Payer: BC Managed Care – PPO | Admitting: Lab

## 2012-07-06 ENCOUNTER — Ambulatory Visit (HOSPITAL_BASED_OUTPATIENT_CLINIC_OR_DEPARTMENT_OTHER): Payer: BC Managed Care – PPO | Admitting: Hematology & Oncology

## 2012-07-06 VITALS — BP 134/85 | HR 81 | Temp 97.8°F | Resp 18 | Ht 60.0 in | Wt 203.0 lb

## 2012-07-06 DIAGNOSIS — M810 Age-related osteoporosis without current pathological fracture: Secondary | ICD-10-CM

## 2012-07-06 DIAGNOSIS — L670 Trichorrhexis nodosa: Secondary | ICD-10-CM

## 2012-07-06 DIAGNOSIS — Z9484 Stem cells transplant status: Secondary | ICD-10-CM

## 2012-07-06 DIAGNOSIS — C8119 Nodular sclerosis classical Hodgkin lymphoma, extranodal and solid organ sites: Secondary | ICD-10-CM

## 2012-07-06 DIAGNOSIS — C8190 Hodgkin lymphoma, unspecified, unspecified site: Secondary | ICD-10-CM

## 2012-07-06 LAB — CBC WITH DIFFERENTIAL (CANCER CENTER ONLY)
BASO%: 0.8 % (ref 0.0–2.0)
Eosinophils Absolute: 0.1 10*3/uL (ref 0.0–0.5)
HCT: 41.7 % (ref 34.8–46.6)
LYMPH#: 1.8 10*3/uL (ref 0.9–3.3)
LYMPH%: 37.2 % (ref 14.0–48.0)
MCV: 97 fL (ref 81–101)
MONO#: 0.6 10*3/uL (ref 0.1–0.9)
NEUT%: 47.9 % (ref 39.6–80.0)
RBC: 4.28 10*6/uL (ref 3.70–5.32)
WBC: 5 10*3/uL (ref 3.9–10.0)

## 2012-07-06 LAB — LACTATE DEHYDROGENASE: LDH: 172 U/L (ref 94–250)

## 2012-07-06 LAB — COMPREHENSIVE METABOLIC PANEL
ALT: 18 U/L (ref 0–35)
CO2: 27 mEq/L (ref 19–32)
Calcium: 9.4 mg/dL (ref 8.4–10.5)
Chloride: 108 mEq/L (ref 96–112)
Creatinine, Ser: 1 mg/dL (ref 0.50–1.10)
Glucose, Bld: 81 mg/dL (ref 70–99)
Sodium: 141 mEq/L (ref 135–145)
Total Bilirubin: 0.5 mg/dL (ref 0.3–1.2)
Total Protein: 6.9 g/dL (ref 6.0–8.3)

## 2012-07-06 LAB — VITAMIN D 25 HYDROXY (VIT D DEFICIENCY, FRACTURES): Vit D, 25-Hydroxy: 35 ng/mL (ref 30–89)

## 2012-07-06 NOTE — Progress Notes (Signed)
This office note has been dictated.

## 2012-07-07 ENCOUNTER — Telehealth: Payer: Self-pay | Admitting: *Deleted

## 2012-07-07 NOTE — Telephone Encounter (Signed)
Message copied by Anselm Jungling on Thu Jul 07, 2012 12:32 PM ------      Message from: Arlan Organ R      Created: Thu Jul 07, 2012  7:11 AM       Please call and let her node that her labs look great. Thanks. Cindee Lame

## 2012-07-07 NOTE — Telephone Encounter (Signed)
Called patient to let her know that her labwork all looks great per dr. Myna Hidalgo

## 2012-07-07 NOTE — Progress Notes (Signed)
CC:   Robert A. Nicholos Johns, M.D. Lacey Schneiders, MD  DIAGNOSIS:  Recurrent nodular sclerosing Hodgkin disease, clinical remission.  CURRENT THERAPY:  Observation.  INTERIM HISTORY:  Lacey [name] come comes in for a 6 month followup. She is doing quite well.  She has had no complaints at all.  She is still working.  She wants to work for another 5 or 6 years.  She has had no problems with fatigue or weakness.  There is no change in bowel or bladder habits.  She has had no rashes.  There has been no bleeding.  PHYSICAL EXAMINATION:  This is a well-developed, well-nourished white female in no obvious distress.  Vital signs:  Temperature of 97.8, pulse 81, respiratory rate 18, blood pressure 134/85.  Weight is 203.  Head and neck:  Normocephalic, atraumatic skull.  There are no ocular or oral lesions.  There are no palpable cervical or supraclavicular lymph nodes. Lungs:  Clear bilaterally.  Cardiac:  Regular rate and rhythm with a normal S1 and S2.  There are no murmurs or rubs, or bruits.  Abdomen: Soft with good bowel sounds.  There is no palpable abdominal mass.  She has no fluid wave.  There is no palpable hepatosplenomegaly.  Back:  No tenderness of the spine, ribs, or hips.  Extremities:  No clubbing, cyanosis or edema.  Neurological:  No focal neurological deficits.  LABORATORY STUDIES:  White cell count is 5, hemoglobin 14, hematocrit 42, platelet count 135.  IMPRESSION:  Lacey [name] is a very charming 56 year old white female with a history of recurrent nodular sclerosing Hodgkin disease.  She underwent salvage chemotherapy followed by stem cell transplantation. She had this back in May of 2010.  Clinically, she is doing quite well.  I do not see any evidence of recurrence.  We will go ahead and plan to get her back in 6 more months for followup.  I do not see that we need any additional studies on her as she is asymptomatic.    ______________________________ Josph Macho, M.D. PRE/MEDQ  D:  07/06/2012  T:  07/07/2012  Job:  [id number]

## 2012-09-07 ENCOUNTER — Other Ambulatory Visit: Payer: Self-pay | Admitting: Family Medicine

## 2012-09-07 ENCOUNTER — Other Ambulatory Visit (HOSPITAL_COMMUNITY)
Admission: RE | Admit: 2012-09-07 | Discharge: 2012-09-07 | Disposition: A | Discharge status: Home / Self Care | Payer: BC Managed Care – PPO | Source: Ambulatory Visit | Attending: Family Medicine | Admitting: Family Medicine

## 2012-09-07 DIAGNOSIS — Z01419 Encounter for gynecological examination (general) (routine) without abnormal findings: Secondary | ICD-10-CM | POA: Insufficient documentation

## 2013-01-04 ENCOUNTER — Ambulatory Visit (HOSPITAL_BASED_OUTPATIENT_CLINIC_OR_DEPARTMENT_OTHER): Payer: BC Managed Care – PPO

## 2013-01-04 ENCOUNTER — Ambulatory Visit (HOSPITAL_BASED_OUTPATIENT_CLINIC_OR_DEPARTMENT_OTHER): Payer: BC Managed Care – PPO | Admitting: Hematology & Oncology

## 2013-01-04 ENCOUNTER — Other Ambulatory Visit (HOSPITAL_BASED_OUTPATIENT_CLINIC_OR_DEPARTMENT_OTHER): Payer: BC Managed Care – PPO | Admitting: Lab

## 2013-01-04 ENCOUNTER — Ambulatory Visit (HOSPITAL_BASED_OUTPATIENT_CLINIC_OR_DEPARTMENT_OTHER)
Admission: RE | Admit: 2013-01-04 | Discharge: 2013-01-04 | Disposition: A | Discharge status: Home / Self Care | Payer: BC Managed Care – PPO | Source: Ambulatory Visit | Attending: Hematology & Oncology | Admitting: Hematology & Oncology

## 2013-01-04 ENCOUNTER — Encounter: Payer: Self-pay | Admitting: Hematology & Oncology

## 2013-01-04 VITALS — BP 132/79 | HR 95 | Temp 98.1°F | Resp 16 | Ht 60.0 in | Wt 197.0 lb

## 2013-01-04 DIAGNOSIS — R05 Cough: Secondary | ICD-10-CM

## 2013-01-04 DIAGNOSIS — C8198 Hodgkin lymphoma, unspecified, lymph nodes of multiple sites: Secondary | ICD-10-CM

## 2013-01-04 DIAGNOSIS — M810 Age-related osteoporosis without current pathological fracture: Secondary | ICD-10-CM

## 2013-01-04 DIAGNOSIS — Z87898 Personal history of other specified conditions: Secondary | ICD-10-CM | POA: Insufficient documentation

## 2013-01-04 DIAGNOSIS — C8119 Nodular sclerosis classical Hodgkin lymphoma, extranodal and solid organ sites: Secondary | ICD-10-CM

## 2013-01-04 DIAGNOSIS — R058 Other specified cough: Secondary | ICD-10-CM

## 2013-01-04 DIAGNOSIS — Z9484 Stem cells transplant status: Secondary | ICD-10-CM

## 2013-01-04 DIAGNOSIS — R059 Cough, unspecified: Secondary | ICD-10-CM | POA: Insufficient documentation

## 2013-01-04 DIAGNOSIS — C8190 Hodgkin lymphoma, unspecified, unspecified site: Secondary | ICD-10-CM

## 2013-01-04 HISTORY — DX: Other specified cough: R05.8

## 2013-01-04 LAB — CBC WITH DIFFERENTIAL (CANCER CENTER ONLY)
BASO%: 1.2 % (ref 0.0–2.0)
EOS%: 2.3 % (ref 0.0–7.0)
HGB: 13.4 g/dL (ref 11.6–15.9)
LYMPH#: 2 10*3/uL (ref 0.9–3.3)
MCH: 32.9 pg (ref 26.0–34.0)
MCHC: 33.2 g/dL (ref 32.0–36.0)
MONO%: 10.2 % (ref 0.0–13.0)
NEUT#: 2.8 10*3/uL (ref 1.5–6.5)
Platelets: 111 10*3/uL — ABNORMAL LOW (ref 145–400)

## 2013-01-04 MED ORDER — IPRATROPIUM-ALBUTEROL 0.5-2.5 (3) MG/3ML IN SOLN
3.0000 mL | Freq: Once | RESPIRATORY_TRACT | Status: AC
Start: 2013-01-04 — End: 2013-01-04
  Administered 2013-01-04: 3 mL via RESPIRATORY_TRACT
  Filled 2013-01-04: qty 3

## 2013-01-04 NOTE — Progress Notes (Signed)
This office note has been dictated.

## 2013-01-04 NOTE — Patient Instructions (Signed)
Inhalation Therapy Inhalation therapy refers to various methods of treatment that work when you breathe in (inhale). Inhalation therapy methods can help improve your breathing and increase the amount of oxygen your body is getting. The decision to use inhalation therapy may be based on your symptoms, physical findings, and results from tests such as lung function tests. RISKS AND COMPLICATIONS  Fast heartbeat (tachycardia) from taking certain inhaled medicines such as albuterol.  Infection of the airways and lungs during prolonged use of breathing tubes (intubation) and a breathing machine (mechanical ventilator).  Accidental inhalation of swallowed material that leads to a lung injury (aspiration injury).  A tear (rupture) in the lungs.  Bleeding in the lungs.  A collapsed lung (pneumothorax).  Less blood pumping from the heart (decreased cardiac output).  Accidental disconnection of a mechanical ventilator. TYPES OF INHALATION THERAPY  Medicine therapy Certain medicines can be delivered by inhalation. These medicines include albuterol and ipratropium for patients with asthma and epinephrine for patients with croup. The medicine is delivered directly to the airways and lungs. This treatment can relax and open the airways and improve oxygen levels.  Oxygen therapy If your body is not getting enough oxygen, oxygen can be delivered through a face mask or breathing tubes that fit under your nostrils (nasal cannula). In severe cases, oxygen can be delivered through a tube that goes in the nose or mouth and down the windpipe (endotracheal tube) that is attached to a mechanical ventilator. The ventilator sends oxygen and air directly into the lungs. Oxygen therapy can be used at home or in the hospital. Oxygen therapy is often used for patients with emphysema and chronic obstructive pulmonary disease (COPD). Oxygen can also be used to help treat patients with cystic fibrosis, long-term (chronic)  heart failure, and various lung diseases.  Incentive spirometry This is a breathing exercise that helps you take long, deep breaths. It may be used following surgery. It can help rehabilitate the lungs and keep them healthy. A device called a spirometer measures the rate of breathing as you inhale. A respiratory therapist will teach you how to use the device. Once you have mastered this exercise, you will be instructed to practice this breathing exercise frequently on your own. It can be done easily at home.  Continuous positive airway pressure (CPAP) CPAP may be used in adults whose airways collapse or become blocked or in infants whose lungs are not fully developed. CPAP delivers a low level of air pressure to keep the airways open and make breathing easier. The air pressure is usually delivered through a face mask as humidified oxygen. The pressure level that is needed is different for each patient. The flow of pressure remains constant while inhaling and exhaling. CPAP can be used at home or in the hospital. Most patients undergoing CPAP treatment in a hospital will receive continuous monitoring of vital signs and periodic sampling of blood gas values. CPAP is commonly used to treat sleep apnea and respiratory distress syndrome. CPAP can also be used to treat patients with moderate to severe respiratory distress. This can include patients with heart failure, chronic obstructive pulmonary disorder (COPD), or pneumonia.  Hyperbaric oxygen therapy This treatment is given when there is an immediate need to increase oxygen levels in the blood. The main purpose of this treatment is to prevent damage to vital organs from a lack of oxygen. This treatment is often done at hyperbaric centers, since most hospitals do not have hyperbaric chambers. The patient is rolled into  a clear, plastic chamber on a stretcher. Oxygen is then delivered into the chamber under forced pressure. The patient breathes the oxygen in as  normal. The lowest possible amount of pressure that is needed will be given, usually 2 to 3 times more than the normal atmospheric pressure. This treatment usually takes 1 hour, although it can take up to 5 hours in serious cases. During treatment, the patient is free to read, nap, or listen to the radio. Before the patient leaves the chamber, the pressure is slowly lowered to normal atmospheric pressure. This treatment is often used for divers with decompression illness, climbers in high altitude, patients suffering from severe carbon dioxide poisoning, and children or adults in acute respiratory distress. Oxygen chamber therapy can also be used to help heal burns and other wounds, such as wounds from an infection, since the pressure can reach areas that are blocked off or suffering from poor circulation.  Mechanical ventilation This treatment involves using a mechanical ventilator when the patient cannot breathe on his or her own. The patient often has oxygen delivered by the ventilator through a breathing tube. Mechanical ventilation is often used in intensive care situations. The patient's vital signs will be continually monitored during treatment in the intensive care unit (ICU). HOME CARE INSTRUCTIONS  Follow your caregiver's instructions if you use inhalation therapy at home.  Do not smoke near your oxygen supply. This can start a fire.  Keep your oxygen supply away from electrical sparks, flame, or high heat.  Avoid taking medicine to help you relax (sedative) if you use oxygen therapy.  Contact your caregiver if your oxygen supply is running low. Oxygen must be replaced in a timely manner to make sure your supply does not run out. SEEK IMMEDIATE MEDICAL CARE IF:  You have any new problems.  You have any questions or concerns.  You have increasing shortness of breath.  You have new or increasing chest pain.  You have a fever.  You start having choking spells.  You have a fast or  irregular heartbeat (palpitations).  You feel lightheaded, or you faint.  You are bleeding from the mouth. MAKE SURE YOU:   Understand these instructions.  Will watch your condition.  Will get help right away if you are not doing well or get worse. Document Released: 12/05/2002 Document Revised: 12/07/2011 Document Reviewed: 08/25/2011 [facility] Patient Information 2013 [place], [place].

## 2013-01-05 LAB — COMPREHENSIVE METABOLIC PANEL
ALT: 16 U/L (ref 0–35)
AST: 16 U/L (ref 0–37)
Albumin: 4.1 g/dL (ref 3.5–5.2)
Alkaline Phosphatase: 92 U/L (ref 39–117)
Calcium: 9.2 mg/dL (ref 8.4–10.5)
Chloride: 108 mEq/L (ref 96–112)
Potassium: 4.2 mEq/L (ref 3.5–5.3)
Sodium: 142 mEq/L (ref 135–145)
Total Protein: 6.8 g/dL (ref 6.0–8.3)

## 2013-01-05 NOTE — Progress Notes (Signed)
CC:   Robert A. Nicholos Johns, M.D. 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  CURRENT THERAPY:  Observation.  INTERIM HISTORY:  Ms. Zeiser comes in for followup.  She still has this cough.  She has had it since December.  I am not sure as to what is going on with this.  She probably needs to have a chest x-ray done.  She certainly needs to be seen by Pulmonary Medicine.  I told that her family doctor can make that referral.  She did go on a cruise in February.  She usually goes on 1 or 2 cruises a year.  She had a good time.  She has had no weight loss or weight gain.  She is still working.  She has had no bleeding or bruising.  She has had no leg swelling.  There have been no rashes.  Overall, her performance status is ECOG 1.  PHYSICAL EXAMINATION:  General:  This is a well-developed, well- nourished white female in no obvious distress.  Vital signs: Temperature of 98.1, pulse 95, respiratory rate 16, blood pressure 132/79.  Weight is 197.  Head and neck:  Normocephalic, atraumatic skull.  There are no ocular or oral lesions.  There are no palpable cervical or supraclavicular lymph nodes.  Lungs:  Clear bilaterally.  I hear no rales, wheezes, or rhonchi.  There is no friction rub.  Cardiac: Regular rate and rhythm with a normal S1 and S2.  There are no murmurs, rubs, or bruits.  Abdomen:  Soft with good bowel sounds.  There is no palpable abdominal mass.  There is no palpable hepatosplenomegaly. Extremities:  No clubbing, cyanosis, or edema.  She has good range of motion of her joints.  Skin:  No rashes, ecchymosis, or petechia.  LABORATORY STUDIES:  White cell count is 5.6, hemoglobin 13.4, hematocrit 40.4, platelet count 111.  IMPRESSION:  [name] is a very nice 57 year old white female with history of recurrent nodular sclerosing Hodgkin disease.  She was treated with salvage chemotherapy  followed by stem cell transplant.  She completed this back in May 2010.  I am not sure why she has this cough.  We will do a chest x-ray on her today.  I will go ahead and do nebulizer on her.  I want to see her back in about 3 months' time now.  The platelet count we need to watch.  One would think that it would be a little bit too soon for any kind of secondary myelodysplastic issues to develop in the bone marrow from her past chemotherapy, but this is a potential concern.  Again, I told Ms. Tarquinio to see her family doctor to get a referral for a pulmonary evaluation.  Addendum:  I will also get an echocardiogram on Ms. Natter.  I want to make sure that there is no cardiac issue that could be causing this cough.    ______________________________ Josph Macho, M.D. PRE/MEDQ  D:  01/04/2013  T:  01/05/2013  Job:  [id number]

## 2013-01-09 ENCOUNTER — Telehealth: Payer: Self-pay | Admitting: *Deleted

## 2013-01-09 NOTE — Telephone Encounter (Signed)
Called patient and left message on personal cell phone that her CXr was normal per dr. Myna Hidalgo and labs were normal.

## 2013-01-09 NOTE — Telephone Encounter (Signed)
Message copied by Anselm Jungling on Mon Jan 09, 2013 10:35 AM ------      Message from: Lacey [name]      Created: Sun Jan 08, 2013  7:49 PM       Call - CXR is (-). No bronchitis.  Pete ------

## 2013-01-10 ENCOUNTER — Ambulatory Visit (HOSPITAL_COMMUNITY): Payer: BC Managed Care – PPO | Attending: Cardiovascular Disease | Admitting: Radiology

## 2013-01-10 DIAGNOSIS — Z9221 Personal history of antineoplastic chemotherapy: Secondary | ICD-10-CM | POA: Insufficient documentation

## 2013-01-10 DIAGNOSIS — I428 Other cardiomyopathies: Secondary | ICD-10-CM

## 2013-01-10 DIAGNOSIS — Z8571 Personal history of Hodgkin lymphoma: Secondary | ICD-10-CM | POA: Insufficient documentation

## 2013-01-10 DIAGNOSIS — R059 Cough, unspecified: Secondary | ICD-10-CM | POA: Insufficient documentation

## 2013-01-10 DIAGNOSIS — C8198 Hodgkin lymphoma, unspecified, lymph nodes of multiple sites: Secondary | ICD-10-CM

## 2013-01-10 DIAGNOSIS — R05 Cough: Secondary | ICD-10-CM | POA: Insufficient documentation

## 2013-01-10 NOTE — Progress Notes (Signed)
Echocardiogram performed.  

## 2013-01-11 ENCOUNTER — Telehealth: Payer: Self-pay | Admitting: *Deleted

## 2013-01-11 NOTE — Telephone Encounter (Signed)
Called patient to let her know that her echocardiogram showed very good heart function left message on personal cell phone

## 2013-01-11 NOTE — Telephone Encounter (Signed)
Message copied by Anselm Jungling on Wed Jan 11, 2013 10:19 AM ------      Message from: Josph Macho      Created: Tue Jan 10, 2013  8:47 PM       Call - echocardiogram shows very good heart function!!!  Cindee Lame ------

## 2013-03-03 ENCOUNTER — Telehealth: Payer: Self-pay | Admitting: Hematology & Oncology

## 2013-03-03 NOTE — Telephone Encounter (Signed)
Left pt message moved time of 04-05-13 appointment

## 2013-03-22 ENCOUNTER — Telehealth: Payer: Self-pay | Admitting: Hematology & Oncology

## 2013-03-22 NOTE — Telephone Encounter (Signed)
Pt called moved 7-9 to 7-30 had to have tues, wend, or thurs at 8 am

## 2013-04-05 ENCOUNTER — Ambulatory Visit: Payer: BC Managed Care – PPO | Admitting: Hematology & Oncology

## 2013-04-05 ENCOUNTER — Other Ambulatory Visit: Payer: BC Managed Care – PPO | Admitting: Lab

## 2013-04-26 ENCOUNTER — Ambulatory Visit (HOSPITAL_BASED_OUTPATIENT_CLINIC_OR_DEPARTMENT_OTHER): Payer: BC Managed Care – PPO | Admitting: Hematology & Oncology

## 2013-04-26 ENCOUNTER — Other Ambulatory Visit (HOSPITAL_BASED_OUTPATIENT_CLINIC_OR_DEPARTMENT_OTHER): Payer: BC Managed Care – PPO | Admitting: Lab

## 2013-04-26 VITALS — BP 124/81 | HR 98 | Temp 98.2°F | Resp 16 | Ht 60.0 in | Wt 197.0 lb

## 2013-04-26 DIAGNOSIS — C8190 Hodgkin lymphoma, unspecified, unspecified site: Secondary | ICD-10-CM

## 2013-04-26 DIAGNOSIS — C8198 Hodgkin lymphoma, unspecified, lymph nodes of multiple sites: Secondary | ICD-10-CM

## 2013-04-26 LAB — CBC WITH DIFFERENTIAL (CANCER CENTER ONLY)
BASO%: 2.2 % — ABNORMAL HIGH (ref 0.0–2.0)
HCT: 37.7 % (ref 34.8–46.6)
LYMPH%: 37.9 % (ref 14.0–48.0)
MCH: 35.1 pg — ABNORMAL HIGH (ref 26.0–34.0)
MCV: 102 fL — ABNORMAL HIGH (ref 81–101)
MONO#: 0.5 10*3/uL (ref 0.1–0.9)
MONO%: 10 % (ref 0.0–13.0)
NEUT%: 47.7 % (ref 39.6–80.0)
Platelets: 98 10*3/uL — ABNORMAL LOW (ref 145–400)
RDW: 13.9 % (ref 11.1–15.7)
WBC: 4.5 10*3/uL (ref 3.9–10.0)

## 2013-04-26 NOTE — Progress Notes (Signed)
This office note has been dictated.

## 2013-04-27 NOTE — Progress Notes (Signed)
CC:   Robert A. Nicholos Johns, M.D. Verita Schneiders, MD  DIAGNOSES: 1. Recurrent nodular sclerosing Hodgkin disease, clinical remission. 2. Progressive thrombocytopenia.  INTERIM HISTORY:  [name] comes in for followup.  She is doing fairly well.  She has had no specific complaints.  She says that she is bruising a little bit.  She has had no fatigue or weakness.  She is eating well.  She is trying to stay fairly active.  She has had no fevers, sweats, or chills.  There have been no flares of her "asthma."  She has had no change in bowel or bladder habits.  PHYSICAL EXAM:  General:  This is a well-developed, well-nourished white female in no obvious distress.  Vital Signs:  Temperature of 98.2, pulse of 98, respiratory rate 16, blood pressure 124/81.  Weight is 197.  Head and Neck:  Show a normocephalic, atraumatic skull.  There are no ocular or oral lesions.  There are no palpable cervical or supraclavicular lymph nodes.  Lungs:  Clear bilaterally.  Cardiac:  Regular rate and rhythm with a normal S1, S2.  There are no murmurs, rubs, or bruits. Abdomen:  Soft.  She has good bowel sounds.  There is no fluid wave. There is no palpable hepatosplenomegaly.  Extremities:  Show no clubbing, cyanosis, or edema.  Neurological:  Shows no focal neurological deficits.  Skin:  Shows a couple of ecchymoses on her legs.  LABORATORY STUDIES:  White cell count is 4.5.  Hemoglobin 13; hematocrit 37.7; platelet count 98,000.  IMPRESSION:  [name] is a 57 year old white female with a history of recurrent nodular sclerosing Hodgkin disease.  She underwent salvage chemotherapy.  She got into remission.  She then had a stem cell transplant at [facility] back in May of 2010.  I did look at her blood smear.  I did not see anything that was suspicious.  We will have to follow the platelet count closely.  I want to see her back in 2 months.  If her platelet count continues to worsen, then we will have to do  a bone marrow transplant on her.    ______________________________ Josph Macho, M.D. PRE/MEDQ  D:  04/26/2013  T:  04/27/2013  Job:  [id number]

## 2013-06-29 ENCOUNTER — Ambulatory Visit (HOSPITAL_BASED_OUTPATIENT_CLINIC_OR_DEPARTMENT_OTHER): Payer: BC Managed Care – PPO | Admitting: Hematology & Oncology

## 2013-06-29 ENCOUNTER — Other Ambulatory Visit: Payer: Self-pay | Admitting: Radiology

## 2013-06-29 ENCOUNTER — Other Ambulatory Visit (HOSPITAL_BASED_OUTPATIENT_CLINIC_OR_DEPARTMENT_OTHER): Payer: BC Managed Care – PPO | Admitting: Lab

## 2013-06-29 VITALS — BP 121/79 | HR 89 | Temp 98.0°F | Resp 14 | Ht 60.0 in | Wt 199.0 lb

## 2013-06-29 DIAGNOSIS — D696 Thrombocytopenia, unspecified: Secondary | ICD-10-CM

## 2013-06-29 DIAGNOSIS — C8190 Hodgkin lymphoma, unspecified, unspecified site: Secondary | ICD-10-CM

## 2013-06-29 DIAGNOSIS — C8119 Nodular sclerosis classical Hodgkin lymphoma, extranodal and solid organ sites: Secondary | ICD-10-CM

## 2013-06-29 DIAGNOSIS — D510 Vitamin B12 deficiency anemia due to intrinsic factor deficiency: Secondary | ICD-10-CM

## 2013-06-29 DIAGNOSIS — Z9484 Stem cells transplant status: Secondary | ICD-10-CM

## 2013-06-29 DIAGNOSIS — Z8571 Personal history of Hodgkin lymphoma: Secondary | ICD-10-CM

## 2013-06-29 LAB — CBC WITH DIFFERENTIAL (CANCER CENTER ONLY)
BASO#: 0.1 10*3/uL (ref 0.0–0.2)
EOS%: 2.4 % (ref 0.0–7.0)
Eosinophils Absolute: 0.1 10*3/uL (ref 0.0–0.5)
HGB: 12 g/dL (ref 11.6–15.9)
LYMPH%: 36.7 % (ref 14.0–48.0)
MCH: 35.1 pg — ABNORMAL HIGH (ref 26.0–34.0)
MCHC: 33.1 g/dL (ref 32.0–36.0)
MCV: 106 fL — ABNORMAL HIGH (ref 81–101)
MONO%: 10.8 % (ref 0.0–13.0)
Platelets: 93 10*3/uL — ABNORMAL LOW (ref 145–400)
RBC: 3.42 10*6/uL — ABNORMAL LOW (ref 3.70–5.32)

## 2013-06-29 NOTE — Progress Notes (Signed)
This office note has been dictated.

## 2013-06-30 NOTE — Progress Notes (Signed)
CC:   Robert A. Nicholos Johns, M.D. Lacey Schneiders, MD  DIAGNOSES: 1. Recurrent nodular sclerosing Hodgkin disease, clinical remission,     status post stem cell transplant. 2. Thrombocytopenia.  CURRENT THERAPY:  Observation.  INTERIM HISTORY:  Lacey [name] comes in for her followup.  We last saw her back in July.  I was worried because her platelet count was gradually dropping.  I just want make sure that we followed up with this.  She is doing okay.  She has had no bleeding or bruising.  She is still working at [employer].  She is working full time.  She has had no cough or shortness breath.  She has had no weight loss or weight gain.  She has had no fever.  He has had no change in bowel or bladder habits.  She has not noted any rashes.  PHYSICAL EXAMINATION:  General:  This is a well-developed, well- nourished white female in no obvious distress.  Vital signs: Temperature of 98, pulse 89, respiratory rate 14, blood pressure 121/79. Weight is 199 pounds.  Head and neck:  Normocephalic, atraumatic skull. There are no ocular or oral lesions.  There are no palpable cervical or supraclavicular lymph nodes.  Lungs:  Clear bilaterally.  Cardiac: Regular rate and rhythm with a normal S1 and S2.  There are no murmurs, rubs or bruits.  Abdomen:  Soft.  She has good bowel sounds.  There is no fluid wave.  There is no palpable hepatosplenomegaly.  Axillary: Shows no bilateral axillary adenopathy.  Back:  No tenderness over the spine, ribs, or hips.  Extremities:  No clubbing, cyanosis or edema. She has good range motion of her joints.  She has good muscle strength in her upper and lower extremities.  She has good pulses in her distal extremities.  Neurologic:  No focal neurological deficits.  Skin:  No rashes, ecchymosis, or petechia.  LABORATORY STUDIES:  White cell count is 5.4, hemoglobin 12, hematocrit 36.2, platelet count 93,000.  MCV is 106.  Peripheral smear shows some mild anisocytosis  and poikilocytosis.  I do see a couple of nucleated red blood cells.  There were no schistocytes. There were no spherocytes.  I did not detect any rouleaux formation. White cells are adequate in number.  She had a few myelocytes and metamyelocytes.  I did not see any blasts.  There were no hypersegmented polys.  There were no atypical lymphocytes.  Platelets were decreased in number.  She had several large platelets.  Her platelets were well granulated.  IMPRESSION:  Lacey [name] is a very charming 57 year old white female. She had recurrent Hodgkin disease.  We got her into remission with chemotherapy.  She then underwent autologous stem cell transplant.  This was 4-1/2 years ago at [facility] in May of 2010.  I am just worried now that were are seeing consequences of all of her past therapy.  I am seeing that her platelet count is gradually decreasing.  Her blood smear shows some nucleated red cells.  I see some immature myeloid cells.  Her MCV is going up.  I think everything is pointing to her developing a myelodysplastic process.  As such, she is going to need to have a bone marrow test done.  I am checking her vitamin B12 level.  I think if her B12 level is low, then we can probably hold off on a bone marrow biopsy.  I spent a good half hour or more with Lacey [name].  This is  a complicated situation.  Things certainly have gotten a little bit more difficult given that her thrombocytopenia is progressing.  She understands what my recommendations are.  She agrees to have the bone marrow test done.  We will get it set up to be done by Radiology at [facility].  I will plan to see her back in about 5 or 6 weeks.  By then, we should have a very good idea as to what is going on.    ______________________________ Josph Macho, M.D. PRE/MEDQ  D:  06/29/2013  T:  06/30/2013  Job:  [id number]

## 2013-07-04 ENCOUNTER — Other Ambulatory Visit: Payer: Self-pay | Admitting: Lab

## 2013-07-04 DIAGNOSIS — D510 Vitamin B12 deficiency anemia due to intrinsic factor deficiency: Secondary | ICD-10-CM

## 2013-07-05 ENCOUNTER — Encounter (HOSPITAL_COMMUNITY): Payer: Self-pay | Admitting: Pharmacy Technician

## 2013-07-05 ENCOUNTER — Other Ambulatory Visit: Payer: Self-pay | Admitting: Radiology

## 2013-07-06 ENCOUNTER — Ambulatory Visit (HOSPITAL_COMMUNITY)
Admission: RE | Admit: 2013-07-06 | Discharge: 2013-07-06 | Disposition: A | Discharge status: Home / Self Care | Payer: BC Managed Care – PPO | Source: Ambulatory Visit | Attending: Hematology & Oncology | Admitting: Hematology & Oncology

## 2013-07-06 ENCOUNTER — Encounter (HOSPITAL_COMMUNITY): Payer: Self-pay

## 2013-07-06 DIAGNOSIS — D696 Thrombocytopenia, unspecified: Secondary | ICD-10-CM | POA: Insufficient documentation

## 2013-07-06 DIAGNOSIS — C8190 Hodgkin lymphoma, unspecified, unspecified site: Secondary | ICD-10-CM | POA: Insufficient documentation

## 2013-07-06 DIAGNOSIS — D510 Vitamin B12 deficiency anemia due to intrinsic factor deficiency: Secondary | ICD-10-CM

## 2013-07-06 HISTORY — DX: Fibromyalgia: M79.7

## 2013-07-06 LAB — CBC
MCH: 34.4 pg — ABNORMAL HIGH (ref 26.0–34.0)
MCHC: 33.6 g/dL (ref 30.0–36.0)
RDW: 14.1 % (ref 11.5–15.5)

## 2013-07-06 LAB — PROTIME-INR
INR: 0.94 (ref 0.00–1.49)
Prothrombin Time: 12.4 seconds (ref 11.6–15.2)

## 2013-07-06 LAB — APTT: aPTT: 39 seconds — ABNORMAL HIGH (ref 24–37)

## 2013-07-06 MED ORDER — FENTANYL CITRATE 0.05 MG/ML IJ SOLN
INTRAMUSCULAR | Status: AC
  Filled 2013-07-06: qty 6

## 2013-07-06 MED ORDER — SODIUM CHLORIDE 0.9 % IV SOLN
INTRAVENOUS | Status: DC
  Administered 2013-07-06: 07:00:00 via INTRAVENOUS

## 2013-07-06 MED ORDER — FENTANYL CITRATE 0.05 MG/ML IJ SOLN
INTRAMUSCULAR | Status: AC | PRN
  Administered 2013-07-06: 100 ug via INTRAVENOUS

## 2013-07-06 MED ORDER — MIDAZOLAM HCL 2 MG/2ML IJ SOLN
INTRAMUSCULAR | Status: AC
  Filled 2013-07-06: qty 6

## 2013-07-06 MED ORDER — MIDAZOLAM HCL 2 MG/2ML IJ SOLN
INTRAMUSCULAR | Status: AC | PRN
  Administered 2013-07-06 (×2): 1 mg via INTRAVENOUS

## 2013-07-06 NOTE — H&P (Signed)
Agree 

## 2013-07-06 NOTE — H&P (Signed)
Lacey [name] is an 57 y.o. female.   Chief Complaint: "I'm having a bone marrow biopsy" HPI: Patient with history of Hodgkin's disease 5-6 years ago, s/p stem cell transplant 2010; now with progressing thrombocytopenia. She presents today for CT guided bone marrow biopsy to r/o myelodysplastic process.  Past Medical History  Diagnosis Date  . Cancer   . Cough productive of clear sputum 01/04/2013  . Fibromyalgia     Past Surgical History  Procedure Laterality Date  . Bone marrow biopsy  05/20110  . Spine surgery  1998    c-4 c 5 fusion  . Hodgkin      Family History  Problem Relation Age of Onset  . Cancer Mother   . Cancer Father    Social History:  reports that she has never smoked. She does not have any smokeless tobacco history on file. She reports that she drinks alcohol. She reports that she does not use illicit drugs.  Allergies:  Allergies  Allergen Reactions  . Penicillins Hives    Current outpatient prescriptions:cholecalciferol (VITAMIN D) 1000 UNITS tablet, Take 1,000 Units by mouth daily., Disp: , Rfl: ;  DULoxetine (CYMBALTA) 60 MG capsule, Take 60 mg by mouth every evening., Disp: , Rfl: ;  gabapentin (NEURONTIN) 300 MG capsule, Take 600 mg by mouth 2 (two) times daily. , Disp: , Rfl: ;  Green Tea 250 MG CAPS, Take by mouth every morning., Disp: , Rfl:  Current facility-administered medications:0.9 %  sodium chloride infusion, , Intravenous, Continuous, D Jeananne Rama, PA-C, Last Rate: 20 mL/hr at 07/06/13 5409   Results for orders placed during the hospital encounter of 07/06/13 (from the past 48 hour(s))  APTT     Status: Abnormal   Collection Time    07/06/13  7:25 AM      Result Value Range   aPTT 39 (*) 24 - 37 seconds   Comment:            IF BASELINE aPTT IS ELEVATED,     SUGGEST PATIENT RISK ASSESSMENT     BE USED TO DETERMINE APPROPRIATE     ANTICOAGULANT THERAPY.  CBC     Status: Abnormal   Collection Time    07/06/13  7:25 AM      Result  Value Range   WBC 5.3  4.0 - 10.5 K/uL   RBC 3.78 (*) 3.87 - 5.11 MIL/uL   Hemoglobin 13.0  12.0 - 15.0 g/dL   HCT 81.1  91.4 - 78.2 %   MCV 102.4 (*) 78.0 - 100.0 fL   MCH 34.4 (*) 26.0 - 34.0 pg   MCHC 33.6  30.0 - 36.0 g/dL   RDW 95.6  21.3 - 08.6 %   Platelets 106 (*) 150 - 400 K/uL   Comment: SPECIMEN CHECKED FOR CLOTS     REPEATED TO VERIFY     PLATELET COUNT CONFIRMED BY SMEAR  PROTIME-INR     Status: None   Collection Time    07/06/13  7:25 AM      Result Value Range   Prothrombin Time 12.4  11.6 - 15.2 seconds   INR 0.94  0.00 - 1.49   No results found.  Review of Systems  Constitutional: Positive for malaise/fatigue. Negative for fever and chills.  Respiratory: Negative for cough and shortness of breath.   Cardiovascular: Negative for chest pain.  Gastrointestinal: Negative for nausea, vomiting and abdominal pain.  Musculoskeletal: Negative for back pain.  Neurological: Negative for headaches.  Endo/Heme/Allergies:  Does not bruise/bleed easily.    Blood pressure 129/84, pulse 92, temperature 98 F (36.7 C), temperature source Oral, resp. rate 18, height 5' (1.524 m), weight 199 lb (90.266 kg), SpO2 98.00%. Physical Exam  Constitutional: She is oriented to person, place, and time. She appears well-developed and well-nourished.  Cardiovascular: Normal rate and regular rhythm.   Respiratory: Effort normal and breath sounds normal.  GI: Soft. Bowel sounds are normal. There is no tenderness.  Musculoskeletal: Normal range of motion. She exhibits no edema.  Neurological: She is alert and oriented to person, place, and time.     Assessment/Plan Pt with hx of Hodgkin's disease 5-6 yrs ago, s/p stem cell transplant 2010; now with progressing thrombocytopenia. Plan is for CT guided bone marrow biopsy today to r/o myelodysplastic process. Details/risks of procedure d/w pt/husband with their understanding and consent.  ALLRED,D KEVIN 07/06/2013, 8:30 AM

## 2013-07-06 NOTE — Procedures (Signed)
Procedure:  CT guided bone marrow biopsy Findings:  Right iliac bone marrow aspirate and core biopsy performed with On[employer] needle/drill system. No complications.

## 2013-07-10 ENCOUNTER — Telehealth: Payer: Self-pay | Admitting: Hematology & Oncology

## 2013-07-10 NOTE — Telephone Encounter (Signed)
I spoke to Mrs. Sustaita this evening.. I got the results of the bone marrow biopsy and aspirate back. 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 She has abnormalities within theerythroid and megakaryocytic series. There was no increase in blasts.  We do not have yet back the chromosomal analysis. This, I think, will be key.  I did speak with Dr. Jacqulyn Bath at [facility]. He agrees that this is likely a consequence from her Hodgkin's therapy with  multiple rounds of chemotherapy and also  the high-dose chemotherapy with stem cell transplant.  He feels that she might be a candidate for an allogeneic transplant.  In speaking and Ms. Rozar, I told her what was the situation and how this was best treated.  I told Mrs. Fyock that [facility] will call her for an appointment.  She understands what I was tell her. I spelled out the word myelodysplasia to her which am sure she will then look up on the Internet.  I hate this for Ms. Thibault. She's done so well. Her blood counts are not that bad but I think that we need to be proactive and try to intervene before we start seeing any type of acute leukemic transformation.  Cindee Lame

## 2013-07-13 ENCOUNTER — Other Ambulatory Visit (HOSPITAL_COMMUNITY): Payer: BC Managed Care – PPO

## 2013-07-19 LAB — CHROMOSOME ANALYSIS, BONE MARROW

## 2013-07-28 ENCOUNTER — Telehealth: Payer: Self-pay | Admitting: Hematology & Oncology

## 2013-07-28 NOTE — Telephone Encounter (Signed)
Faxed pt's most recent progress note to Markus Daft with [facility] from [facility] today to: [phone].    DATE LAST SEEN: 06/29/2013

## 2013-07-31 ENCOUNTER — Encounter: Payer: Self-pay | Admitting: *Deleted

## 2013-07-31 NOTE — Progress Notes (Signed)
Received call from [facility] with Dr. Jacqulyn Bath from [place].  She wants to know if they need to see Ms. Chaudoin again.  Per Amy Bickling RN Dr. Myna Hidalgo does want Dr. Jacqulyn Bath re evaluate pt before any further tx is done.  Revonda Standard will schedule pt to see Dr. Jacqulyn Bath.  Dr. Gustavo Lah note from 07/10/13 faxed to [facility]

## 2013-08-03 ENCOUNTER — Other Ambulatory Visit: Payer: Self-pay

## 2013-08-03 ENCOUNTER — Encounter: Payer: Self-pay | Admitting: Hematology & Oncology

## 2013-08-10 ENCOUNTER — Ambulatory Visit (HOSPITAL_BASED_OUTPATIENT_CLINIC_OR_DEPARTMENT_OTHER): Payer: BC Managed Care – PPO | Admitting: Hematology & Oncology

## 2013-08-10 ENCOUNTER — Other Ambulatory Visit (HOSPITAL_BASED_OUTPATIENT_CLINIC_OR_DEPARTMENT_OTHER): Payer: BC Managed Care – PPO | Admitting: Lab

## 2013-08-10 VITALS — BP 130/66 | HR 88 | Temp 97.2°F | Resp 14 | Ht 60.0 in | Wt 195.0 lb

## 2013-08-10 DIAGNOSIS — D46Z Other myelodysplastic syndromes: Secondary | ICD-10-CM

## 2013-08-10 DIAGNOSIS — D510 Vitamin B12 deficiency anemia due to intrinsic factor deficiency: Secondary | ICD-10-CM

## 2013-08-10 LAB — CBC WITH DIFFERENTIAL (CANCER CENTER ONLY)
BASO#: 0.1 10*3/uL (ref 0.0–0.2)
BASO%: 2.7 % — ABNORMAL HIGH (ref 0.0–2.0)
EOS%: 3.6 % (ref 0.0–7.0)
Eosinophils Absolute: 0.2 10*3/uL (ref 0.0–0.5)
HCT: 36.5 % (ref 34.8–46.6)
HGB: 11.9 g/dL (ref 11.6–15.9)
LYMPH#: 2 10*3/uL (ref 0.9–3.3)
MCH: 34.3 pg — ABNORMAL HIGH (ref 26.0–34.0)
MCHC: 32.6 g/dL (ref 32.0–36.0)
NEUT#: 1.7 10*3/uL (ref 1.5–6.5)
NEUT%: 38.4 % — ABNORMAL LOW (ref 39.6–80.0)
RBC: 3.47 10*6/uL — ABNORMAL LOW (ref 3.70–5.32)

## 2013-08-10 LAB — RETICULOCYTES (CHCC)
RBC.: 3.59 MIL/uL — ABNORMAL LOW (ref 3.87–5.11)
Retic Ct Pct: 1.1 % (ref 0.4–2.3)

## 2013-08-10 LAB — TECHNOLOGIST REVIEW CHCC SATELLITE

## 2013-08-10 NOTE — Progress Notes (Signed)
This office note has been dictated.

## 2013-08-11 ENCOUNTER — Telehealth: Payer: Self-pay | Admitting: Hematology & Oncology

## 2013-08-11 NOTE — Telephone Encounter (Signed)
Left message moved time of 12-24 appointment

## 2013-08-19 NOTE — Progress Notes (Signed)
CC:   Lacey [name]. Lacey [name], M.D.  DIAGNOSES: 1. Treatment-related myelodysplasia. 2. History of recurrent relapsed Hodgkin disease, status post bone     marrow transplant.  CURRENT THERAPY:  The patient being worked up for allogeneic stem cell transplant.  INTERIM HISTORY:  Lacey [name] comes in for followup.  Unfortunately, we have diagnosed her with treatment-related myelodysplasia.  We went ahead and did a bone marrow biopsy on her.  Radiology was very kind in doing this for [place].  She was last seen on October 9.  The bone marrow report ([id number]) showed a hypercellular marrow with dyserythropoiesis and dysmegakaryopoiesis.  This is all highly consistent with treatment- related myelodysplasia.  She had a present iron stores.  She did not have an increased level of blasts.  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  I think the real key, however, is her cytogenetic studies.  We did do cytogenetics on her.  She has 5 abnormalities.  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  She has already seen Dr. Jacqulyn Bath at [facility].  Again, he is trying to see if her sister is a close match to her.  Lacey [name] is pretty asymptomatic so far.  She is a little more weak. Her platelet count continues to trend downward.  She is at high risk for bleeding.  She also encountered metamyelocytes as a higher risk for infection.  She is still trying to work.  She has had little more of a difficult time with work.  I told that she probably is going to need to go on disability.  However, she qualifies for total disability given this severe new problem that we are dealing with.  She has had no fever.  She has had no rashes.  There has been a couple ecchymoses.  There has been no change in bowel or bladder habits.  She has had no change in her medications.  PHYSICAL EXAMINATION:  General:   This is a well-developed, well- nourished white female in no obvious distress.  Vital Signs: Temperature of 97.2, pulse 88, respiratory rate 14, blood pressure 130/66, weight is 195 pounds.  Head and Neck:  Show normocephalic, atraumatic skull.  There are no ocular or oral lesions.  She has no palpable cervical or supraclavicular lymph nodes.  Lungs:  Clear bilaterally.  She may have occasional wheeze.  Cardiac:  Regular rate and rhythm with normal S1, S2.  There are no murmurs, rubs, or bruits. Abdomen:  Soft.  She has good bowel sounds.  There is no fluid wave. There is no palpable abdominal mass.  She has no palpable hepatosplenomegaly.  Extremities:  Show no clubbing, cyanosis, or edema. Neurological:  No focal neurological deficits.  Skin:  Some scattered ecchymoses.  I see no petechia.  LABORATORY STUDIES:  White cell count is 4.4, hemoglobin 11.9, hematocrit 36.5, platelet count 91,000.  On blood smear, I do see rare nucleated red blood cells.  She does have an occasional myelocyte.  I do not see any blasts.  Platelets are decreased in number.  Platelets are small but well granulated.  I do not see any rouleaux formation.  IMPRESSION:  Lacey [name] is a very nice 57 year old white female with past history of relapsed Hodgkin disease.  She underwent autologous stem cell transplant.  This was in May 2010.  Unfortunately, we now have a myelodysplastic process that clearly,  in my mind, evolved out of this transplant and possibly her prior Hodgkin disease treatment.  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  She has multiple cytogenetic abnormalities.  She will be treated out at [facility].  She will an allogeneic transplant. Hopefully, her sister will provide this.  She had a long talk with Dr. Jacqulyn Bath about all this.  I think she understands the severity of the problem that we are dealing with.  I have her blood counts monitored here every 3 weeks until she goes out to [facility] for  transplantation therapy.  I want to see her back in 6 weeks if she has not gone out to [facility] yet.  I spent a good 45-50 minutes with her today, talking to her about this situation.  She has a strong faith.  She knows that she will get through this.  She knows, however, that this is not going to be easy that it will be a "marathon" that she just has to stay strong.    ______________________________ Josph Macho, M.D. PRE/MEDQ  D:  08/10/2013  T:  08/19/2013  Job:  [id number]

## 2013-08-21 ENCOUNTER — Telehealth: Payer: Self-pay | Admitting: Hematology & Oncology

## 2013-08-21 NOTE — Telephone Encounter (Signed)
Faxed Medical Records via fax today  to:  [facility]   Ph: [phone] Fx: [phone]   Medical  Records requested most recent bone marrow path report.   CONSENT COPY SCANNED

## 2013-08-22 LAB — BONE MARROW EXAM

## 2013-08-31 ENCOUNTER — Other Ambulatory Visit (HOSPITAL_BASED_OUTPATIENT_CLINIC_OR_DEPARTMENT_OTHER): Payer: BC Managed Care – PPO | Admitting: Lab

## 2013-08-31 DIAGNOSIS — D46Z Other myelodysplastic syndromes: Secondary | ICD-10-CM

## 2013-08-31 LAB — CBC WITH DIFFERENTIAL/PLATELET
BASO%: 2.4 % — ABNORMAL HIGH (ref 0.0–2.0)
EOS%: 2.8 % (ref 0.0–7.0)
Eosinophils Absolute: 0.2 10*3/uL (ref 0.0–0.5)
LYMPH%: 42.6 % (ref 14.0–49.7)
MCHC: 32.7 g/dL (ref 31.5–36.0)
MCV: 103.8 fL — ABNORMAL HIGH (ref 79.5–101.0)
MONO#: 0.6 10*3/uL (ref 0.1–0.9)
NEUT#: 2.2 10*3/uL (ref 1.5–6.5)
Platelets: 109 10*3/uL — ABNORMAL LOW (ref 145–400)
RBC: 3.39 10*6/uL — ABNORMAL LOW (ref 3.70–5.45)
RDW: 14.8 % — ABNORMAL HIGH (ref 11.2–14.5)
WBC: 5.4 10*3/uL (ref 3.9–10.3)
lymph#: 2.3 10*3/uL (ref 0.9–3.3)
nRBC: 1 % — ABNORMAL HIGH (ref 0–0)

## 2013-08-31 LAB — CHCC SMEAR

## 2013-09-20 ENCOUNTER — Ambulatory Visit: Payer: BC Managed Care – PPO | Admitting: Hematology & Oncology

## 2013-09-20 ENCOUNTER — Ambulatory Visit (HOSPITAL_BASED_OUTPATIENT_CLINIC_OR_DEPARTMENT_OTHER): Payer: BC Managed Care – PPO | Admitting: Hematology & Oncology

## 2013-09-20 ENCOUNTER — Other Ambulatory Visit: Payer: BC Managed Care – PPO | Admitting: Lab

## 2013-09-20 ENCOUNTER — Other Ambulatory Visit (HOSPITAL_BASED_OUTPATIENT_CLINIC_OR_DEPARTMENT_OTHER): Payer: BC Managed Care – PPO

## 2013-09-20 VITALS — BP 125/81 | HR 106 | Temp 98.6°F | Resp 18

## 2013-09-20 DIAGNOSIS — D46Z Other myelodysplastic syndromes: Secondary | ICD-10-CM

## 2013-09-20 DIAGNOSIS — Q068 Other specified congenital malformations of spinal cord: Secondary | ICD-10-CM

## 2013-09-20 DIAGNOSIS — D469 Myelodysplastic syndrome, unspecified: Secondary | ICD-10-CM

## 2013-09-20 LAB — CBC WITH DIFFERENTIAL (CANCER CENTER ONLY)
HGB: 10.8 g/dL — ABNORMAL LOW (ref 11.6–15.9)
MCH: 34.1 pg — ABNORMAL HIGH (ref 26.0–34.0)
Platelets: 92 10*3/uL — ABNORMAL LOW (ref 145–400)
RBC: 3.17 10*6/uL — ABNORMAL LOW (ref 3.70–5.32)
WBC: 5.6 10*3/uL (ref 3.9–10.0)

## 2013-09-20 LAB — MANUAL DIFFERENTIAL (CHCC SATELLITE)
ALC: 3.5 10*3/uL — ABNORMAL HIGH (ref 0.6–2.2)
ANC (CHCC HP manual diff): 1.4 10*3/uL — ABNORMAL LOW (ref 1.5–6.7)
BASO: 2 % (ref 0–2)
Band Neutrophils: 2 % (ref 0–10)
LYMPH: 62 % — ABNORMAL HIGH (ref 14–48)
MONO: 8 % (ref 0–13)
Myelocytes: 3 % — ABNORMAL HIGH (ref 0–0)
PLT EST ~~LOC~~: DECREASED
Platelet Morphology: NORMAL
SEG: 20 % — ABNORMAL LOW (ref 40–75)

## 2013-09-20 NOTE — Progress Notes (Signed)
This office note has been dictated.

## 2013-09-21 NOTE — Progress Notes (Signed)
CC:   Robert A. Nicholos Johns, M.D.  DIAGNOSES: 1. Treatment-related myelodysplasia. 2. History of recurrent Hodgkin disease - status post bone marrow     transplant.  CURRENT THERAPY:  Observation.  INTERIM HISTORY:  Lacey [name] comes in for followup.  She has been seen out at [facility].  Dr. Jacqulyn Bath saw her.  He saw and evaluated her for an allogeneic transplant.  Her sister unfortunately is not a Microbiologist.  She will have to go through the donor program.  She has been doing pretty well.  She has had no problems with bleeding or bruising.  She has had no fever, sweats, or chills.  Her appetite has been doing okay.  She has had no change in bowel or bladder habits. There has been no cough.  Of note, when we did a bone marrow biopsy back in October, she had multiple chromosomal abnormalities.  PHYSICAL EXAMINATION:  General:  This is a well-developed, well- nourished white female, in no obvious distress.  Vital Signs:  Show temperature of 98.6, pulse 106, respiratory rate 18, blood pressure 125/81.  Weight is 191 pounds.  Head and Neck Exam:  Shows a normocephalic, atraumatic skull.  There are no ocular or oral lesions. There are no palpable cervical or supraclavicular lymph nodes.  Lungs: Clear bilaterally.  Cardiac Exam:  Regular rate and rhythm with a normal S1, S2.  There are no murmurs, rubs, or bruits.  Abdomen:  Soft.  She has good bowel sounds.  There is no fluid wave.  There is no palpable abdominal mass.  There is no palpable hepatosplenomegaly.  Extremities: Show no clubbing, cyanosis, or edema.  Neurological Exam:  Shows no focal neurological deficits.  LABORATORY STUDIES:  White cell count is 5.6, hemoglobin 10.8, hematocrit 33.3, platelet count is 92,000.  MCV is 105.  Peripheral smear shows no blasts.  She has a couple of nucleated red blood cells.  She has a couple of metamyelocytes.  She has no rouleaux formation.  She has decreased platelets that are well  granulated.  IMPRESSION:  Lacey [name] is a very nice 57 year old white female.  She has a treatment-related myelodysplasia.  She has high-risk disease given her multiple chromosomal abnormalities.  One would have to say that her IPSS score is probably, I think, at  5.5.  This is at a very high-risk according to the IPSS-R score.  Again, she has been evaluated for allogeneic transplant.  We will go ahead and have her blood counts followed every 2-3 weeks.  I will plan to see her back myself in another couple months or sooner if necessary.    ______________________________ Lacey [name], M.D. PRE/MEDQ  D:  09/20/2013  T:  09/20/2013  Job:  [id number]

## 2013-10-13 ENCOUNTER — Other Ambulatory Visit (HOSPITAL_BASED_OUTPATIENT_CLINIC_OR_DEPARTMENT_OTHER): Payer: BC Managed Care – PPO

## 2013-10-13 DIAGNOSIS — C8119 Nodular sclerosis classical Hodgkin lymphoma, extranodal and solid organ sites: Secondary | ICD-10-CM

## 2013-10-13 DIAGNOSIS — D469 Myelodysplastic syndrome, unspecified: Secondary | ICD-10-CM

## 2013-10-13 LAB — CBC WITH DIFFERENTIAL/PLATELET
BASO%: 2 % (ref 0.0–2.0)
BASOS ABS: 0.2 10*3/uL — AB (ref 0.0–0.1)
EOS%: 2.9 % (ref 0.0–7.0)
Eosinophils Absolute: 0.2 10*3/uL (ref 0.0–0.5)
HEMATOCRIT: 28.2 % — AB (ref 34.8–46.6)
HEMOGLOBIN: 8.8 g/dL — AB (ref 11.6–15.9)
LYMPH%: 38.2 % (ref 14.0–49.7)
MCH: 32.2 pg (ref 25.1–34.0)
MCHC: 31.2 g/dL — AB (ref 31.5–36.0)
MCV: 103.3 fL — ABNORMAL HIGH (ref 79.5–101.0)
MONO#: 0.7 10*3/uL (ref 0.1–0.9)
MONO%: 8.6 % (ref 0.0–14.0)
NEUT#: 3.8 10*3/uL (ref 1.5–6.5)
NEUT%: 48.3 % (ref 38.4–76.8)
Platelets: 96 10*3/uL — ABNORMAL LOW (ref 145–400)
RBC: 2.73 10*6/uL — ABNORMAL LOW (ref 3.70–5.45)
RDW: 16.7 % — ABNORMAL HIGH (ref 11.2–14.5)
WBC: 7.9 10*3/uL (ref 3.9–10.3)
lymph#: 3 10*3/uL (ref 0.9–3.3)
nRBC: 2 % — ABNORMAL HIGH (ref 0–0)

## 2013-10-13 LAB — TECHNOLOGIST REVIEW: Technologist Review: 2

## 2013-11-06 ENCOUNTER — Encounter: Payer: Self-pay | Admitting: Nurse Practitioner

## 2013-11-06 NOTE — Progress Notes (Signed)
Pt called and stated she felt like she had the flu or bronchitis. She did f/u with PCP and he told her "she may have the flu her symptoms sounded as though she did". Pt has a scheduled appointment on 2/12 advised that she be sure to keep this appointment if at all possible and if anything changes to please not hesitate to contact our office. Pt was going to f/u again with PCP as she has had this cough and congestion for 7 days now.

## 2013-11-07 ENCOUNTER — Emergency Department (HOSPITAL_COMMUNITY): Payer: BC Managed Care – PPO

## 2013-11-07 ENCOUNTER — Inpatient Hospital Stay (HOSPITAL_COMMUNITY): Payer: BC Managed Care – PPO

## 2013-11-07 ENCOUNTER — Encounter (HOSPITAL_COMMUNITY): Payer: Self-pay | Admitting: Emergency Medicine

## 2013-11-07 ENCOUNTER — Inpatient Hospital Stay (HOSPITAL_COMMUNITY)
Admission: EM | Admit: 2013-11-07 | Discharge: 2013-12-27 | DRG: 871 | Disposition: E | Payer: BC Managed Care – PPO | Attending: Internal Medicine | Admitting: Internal Medicine

## 2013-11-07 DIAGNOSIS — R509 Fever, unspecified: Secondary | ICD-10-CM

## 2013-11-07 DIAGNOSIS — R6520 Severe sepsis without septic shock: Secondary | ICD-10-CM

## 2013-11-07 DIAGNOSIS — J9620 Acute and chronic respiratory failure, unspecified whether with hypoxia or hypercapnia: Secondary | ICD-10-CM

## 2013-11-07 DIAGNOSIS — M797 Fibromyalgia: Secondary | ICD-10-CM

## 2013-11-07 DIAGNOSIS — Y849 Medical procedure, unspecified as the cause of abnormal reaction of the patient, or of later complication, without mention of misadventure at the time of the procedure: Secondary | ICD-10-CM | POA: Diagnosis not present

## 2013-11-07 DIAGNOSIS — N3946 Mixed incontinence: Secondary | ICD-10-CM | POA: Diagnosis present

## 2013-11-07 DIAGNOSIS — Z981 Arthrodesis status: Secondary | ICD-10-CM

## 2013-11-07 DIAGNOSIS — I82619 Acute embolism and thrombosis of superficial veins of unspecified upper extremity: Secondary | ICD-10-CM | POA: Diagnosis not present

## 2013-11-07 DIAGNOSIS — J209 Acute bronchitis, unspecified: Secondary | ICD-10-CM

## 2013-11-07 DIAGNOSIS — D6859 Other primary thrombophilia: Secondary | ICD-10-CM | POA: Diagnosis present

## 2013-11-07 DIAGNOSIS — Z79899 Other long term (current) drug therapy: Secondary | ICD-10-CM

## 2013-11-07 DIAGNOSIS — C9210 Chronic myeloid leukemia, BCR/ABL-positive, not having achieved remission: Secondary | ICD-10-CM | POA: Diagnosis present

## 2013-11-07 DIAGNOSIS — R21 Rash and other nonspecific skin eruption: Secondary | ICD-10-CM

## 2013-11-07 DIAGNOSIS — E2740 Unspecified adrenocortical insufficiency: Secondary | ICD-10-CM

## 2013-11-07 DIAGNOSIS — E860 Dehydration: Secondary | ICD-10-CM | POA: Diagnosis present

## 2013-11-07 DIAGNOSIS — I5032 Chronic diastolic (congestive) heart failure: Secondary | ICD-10-CM

## 2013-11-07 DIAGNOSIS — H538 Other visual disturbances: Secondary | ICD-10-CM | POA: Diagnosis present

## 2013-11-07 DIAGNOSIS — K140 Glossitis: Secondary | ICD-10-CM | POA: Diagnosis not present

## 2013-11-07 DIAGNOSIS — R058 Other specified cough: Secondary | ICD-10-CM

## 2013-11-07 DIAGNOSIS — D62 Acute posthemorrhagic anemia: Secondary | ICD-10-CM | POA: Diagnosis not present

## 2013-11-07 DIAGNOSIS — Z9221 Personal history of antineoplastic chemotherapy: Secondary | ICD-10-CM

## 2013-11-07 DIAGNOSIS — R740 Nonspecific elevation of levels of transaminase and lactic acid dehydrogenase [LDH]: Secondary | ICD-10-CM

## 2013-11-07 DIAGNOSIS — R4182 Altered mental status, unspecified: Secondary | ICD-10-CM

## 2013-11-07 DIAGNOSIS — K1370 Unspecified lesions of oral mucosa: Secondary | ICD-10-CM | POA: Diagnosis present

## 2013-11-07 DIAGNOSIS — D649 Anemia, unspecified: Secondary | ICD-10-CM

## 2013-11-07 DIAGNOSIS — J90 Pleural effusion, not elsewhere classified: Secondary | ICD-10-CM

## 2013-11-07 DIAGNOSIS — D689 Coagulation defect, unspecified: Secondary | ICD-10-CM | POA: Diagnosis present

## 2013-11-07 DIAGNOSIS — D72829 Elevated white blood cell count, unspecified: Secondary | ICD-10-CM

## 2013-11-07 DIAGNOSIS — R112 Nausea with vomiting, unspecified: Secondary | ICD-10-CM

## 2013-11-07 DIAGNOSIS — R7402 Elevation of levels of lactic acid dehydrogenase (LDH): Secondary | ICD-10-CM | POA: Diagnosis not present

## 2013-11-07 DIAGNOSIS — F411 Generalized anxiety disorder: Secondary | ICD-10-CM | POA: Diagnosis present

## 2013-11-07 DIAGNOSIS — F3289 Other specified depressive episodes: Secondary | ICD-10-CM | POA: Diagnosis present

## 2013-11-07 DIAGNOSIS — T82898A Other specified complication of vascular prosthetic devices, implants and grafts, initial encounter: Secondary | ICD-10-CM | POA: Diagnosis not present

## 2013-11-07 DIAGNOSIS — N179 Acute kidney failure, unspecified: Secondary | ICD-10-CM

## 2013-11-07 DIAGNOSIS — K921 Melena: Secondary | ICD-10-CM

## 2013-11-07 DIAGNOSIS — I959 Hypotension, unspecified: Secondary | ICD-10-CM

## 2013-11-07 DIAGNOSIS — R220 Localized swelling, mass and lump, head: Secondary | ICD-10-CM | POA: Diagnosis present

## 2013-11-07 DIAGNOSIS — G9340 Encephalopathy, unspecified: Secondary | ICD-10-CM | POA: Diagnosis present

## 2013-11-07 DIAGNOSIS — M79601 Pain in right arm: Secondary | ICD-10-CM

## 2013-11-07 DIAGNOSIS — R7401 Elevation of levels of liver transaminase levels: Secondary | ICD-10-CM | POA: Diagnosis not present

## 2013-11-07 DIAGNOSIS — J189 Pneumonia, unspecified organism: Secondary | ICD-10-CM

## 2013-11-07 DIAGNOSIS — E43 Unspecified severe protein-calorie malnutrition: Secondary | ICD-10-CM

## 2013-11-07 DIAGNOSIS — I82409 Acute embolism and thrombosis of unspecified deep veins of unspecified lower extremity: Secondary | ICD-10-CM

## 2013-11-07 DIAGNOSIS — D599 Acquired hemolytic anemia, unspecified: Secondary | ICD-10-CM | POA: Diagnosis present

## 2013-11-07 DIAGNOSIS — I10 Essential (primary) hypertension: Secondary | ICD-10-CM | POA: Diagnosis not present

## 2013-11-07 DIAGNOSIS — D61818 Other pancytopenia: Secondary | ICD-10-CM | POA: Diagnosis present

## 2013-11-07 DIAGNOSIS — R05 Cough: Secondary | ICD-10-CM

## 2013-11-07 DIAGNOSIS — D589 Hereditary hemolytic anemia, unspecified: Secondary | ICD-10-CM

## 2013-11-07 DIAGNOSIS — J9819 Other pulmonary collapse: Secondary | ICD-10-CM | POA: Diagnosis not present

## 2013-11-07 DIAGNOSIS — N12 Tubulo-interstitial nephritis, not specified as acute or chronic: Secondary | ICD-10-CM

## 2013-11-07 DIAGNOSIS — F32A Depression, unspecified: Secondary | ICD-10-CM

## 2013-11-07 DIAGNOSIS — J3489 Other specified disorders of nose and nasal sinuses: Secondary | ICD-10-CM | POA: Diagnosis present

## 2013-11-07 DIAGNOSIS — K661 Hemoperitoneum: Secondary | ICD-10-CM | POA: Diagnosis not present

## 2013-11-07 DIAGNOSIS — T451X5A Adverse effect of antineoplastic and immunosuppressive drugs, initial encounter: Secondary | ICD-10-CM | POA: Diagnosis present

## 2013-11-07 DIAGNOSIS — E876 Hypokalemia: Secondary | ICD-10-CM

## 2013-11-07 DIAGNOSIS — R0789 Other chest pain: Secondary | ICD-10-CM

## 2013-11-07 DIAGNOSIS — R6521 Severe sepsis with septic shock: Secondary | ICD-10-CM

## 2013-11-07 DIAGNOSIS — C8190 Hodgkin lymphoma, unspecified, unspecified site: Secondary | ICD-10-CM

## 2013-11-07 DIAGNOSIS — Q068 Other specified congenital malformations of spinal cord: Secondary | ICD-10-CM

## 2013-11-07 DIAGNOSIS — T502X5A Adverse effect of carbonic-anhydrase inhibitors, benzothiadiazides and other diuretics, initial encounter: Secondary | ICD-10-CM | POA: Diagnosis not present

## 2013-11-07 DIAGNOSIS — E279 Disorder of adrenal gland, unspecified: Secondary | ICD-10-CM

## 2013-11-07 DIAGNOSIS — Z66 Do not resuscitate: Secondary | ICD-10-CM | POA: Diagnosis not present

## 2013-11-07 DIAGNOSIS — D4620 Refractory anemia with excess of blasts, unspecified: Secondary | ICD-10-CM | POA: Diagnosis present

## 2013-11-07 DIAGNOSIS — L270 Generalized skin eruption due to drugs and medicaments taken internally: Secondary | ICD-10-CM | POA: Diagnosis not present

## 2013-11-07 DIAGNOSIS — F329 Major depressive disorder, single episode, unspecified: Secondary | ICD-10-CM | POA: Diagnosis present

## 2013-11-07 DIAGNOSIS — Z880 Allergy status to penicillin: Secondary | ICD-10-CM

## 2013-11-07 DIAGNOSIS — E2749 Other adrenocortical insufficiency: Secondary | ICD-10-CM

## 2013-11-07 DIAGNOSIS — H6693 Otitis media, unspecified, bilateral: Secondary | ICD-10-CM

## 2013-11-07 DIAGNOSIS — D469 Myelodysplastic syndrome, unspecified: Secondary | ICD-10-CM

## 2013-11-07 DIAGNOSIS — I509 Heart failure, unspecified: Secondary | ICD-10-CM | POA: Diagnosis present

## 2013-11-07 DIAGNOSIS — R918 Other nonspecific abnormal finding of lung field: Secondary | ICD-10-CM

## 2013-11-07 DIAGNOSIS — R221 Localized swelling, mass and lump, neck: Secondary | ICD-10-CM

## 2013-11-07 DIAGNOSIS — H6690 Otitis media, unspecified, unspecified ear: Secondary | ICD-10-CM | POA: Diagnosis present

## 2013-11-07 DIAGNOSIS — D696 Thrombocytopenia, unspecified: Secondary | ICD-10-CM

## 2013-11-07 DIAGNOSIS — F410 Panic disorder [episodic paroxysmal anxiety] without agoraphobia: Secondary | ICD-10-CM | POA: Diagnosis present

## 2013-11-07 DIAGNOSIS — IMO0001 Reserved for inherently not codable concepts without codable children: Secondary | ICD-10-CM

## 2013-11-07 DIAGNOSIS — I5033 Acute on chronic diastolic (congestive) heart failure: Secondary | ICD-10-CM

## 2013-11-07 DIAGNOSIS — R0609 Other forms of dyspnea: Secondary | ICD-10-CM

## 2013-11-07 DIAGNOSIS — R197 Diarrhea, unspecified: Secondary | ICD-10-CM

## 2013-11-07 DIAGNOSIS — Z6841 Body Mass Index (BMI) 40.0 and over, adult: Secondary | ICD-10-CM

## 2013-11-07 DIAGNOSIS — A419 Sepsis, unspecified organism: Principal | ICD-10-CM

## 2013-11-07 LAB — CBC WITH DIFFERENTIAL/PLATELET
BASOS ABS: 0.1 10*3/uL (ref 0.0–0.1)
BASOS PCT: 1 % (ref 0–1)
Eosinophils Absolute: 0.1 10*3/uL (ref 0.0–0.7)
Eosinophils Relative: 1 % (ref 0–5)
HCT: 22.4 % — ABNORMAL LOW (ref 36.0–46.0)
HEMOGLOBIN: 7 g/dL — AB (ref 12.0–15.0)
LYMPHS ABS: 1.3 10*3/uL (ref 0.7–4.0)
Lymphocytes Relative: 11 % — ABNORMAL LOW (ref 12–46)
MCH: 30.7 pg (ref 26.0–34.0)
MCHC: 31.3 g/dL (ref 30.0–36.0)
MCV: 98.2 fL (ref 78.0–100.0)
MONOS PCT: 14 % — AB (ref 3–12)
Monocytes Absolute: 1.6 10*3/uL — ABNORMAL HIGH (ref 0.1–1.0)
NEUTROS ABS: 8.3 10*3/uL — AB (ref 1.7–7.7)
Neutrophils Relative %: 73 % (ref 43–77)
Platelets: ADEQUATE 10*3/uL (ref 150–400)
RBC: 2.28 MIL/uL — ABNORMAL LOW (ref 3.87–5.11)
RDW: 20.2 % — AB (ref 11.5–15.5)
WBC: 11.4 10*3/uL — ABNORMAL HIGH (ref 4.0–10.5)

## 2013-11-07 LAB — COMPREHENSIVE METABOLIC PANEL
ALK PHOS: 67 U/L (ref 39–117)
ALT: 16 U/L (ref 0–35)
AST: 16 U/L (ref 0–37)
Albumin: 3 g/dL — ABNORMAL LOW (ref 3.5–5.2)
BILIRUBIN TOTAL: 0.8 mg/dL (ref 0.3–1.2)
BUN: 14 mg/dL (ref 6–23)
CALCIUM: 9 mg/dL (ref 8.4–10.5)
CHLORIDE: 99 meq/L (ref 96–112)
CO2: 22 meq/L (ref 19–32)
Creatinine, Ser: 1.08 mg/dL (ref 0.50–1.10)
GFR calc non Af Amer: 55 mL/min — ABNORMAL LOW (ref >90)
GFR, EST AFRICAN AMERICAN: 64 mL/min — AB (ref >90)
Glucose, Bld: 152 mg/dL — ABNORMAL HIGH (ref 70–99)
POTASSIUM: 3.9 meq/L (ref 3.7–5.3)
Sodium: 138 mEq/L (ref 137–147)
Total Protein: 7.9 g/dL (ref 6.0–8.3)

## 2013-11-07 LAB — DIC (DISSEMINATED INTRAVASCULAR COAGULATION) PANEL
APTT: 34 s (ref 24–37)
INR: 1.26 (ref 0.00–1.49)
PROTHROMBIN TIME: 15.5 s — AB (ref 11.6–15.2)

## 2013-11-07 LAB — URINE MICROSCOPIC-ADD ON

## 2013-11-07 LAB — TROPONIN I
Troponin I: <0.3 ng/mL (ref <0.30)
Troponin I: <0.3 ng/mL (ref <0.30)

## 2013-11-07 LAB — D-DIMER, QUANTITATIVE (NOT AT ARMC): D DIMER QUANT: 2.41 ug{FEU}/mL — AB (ref 0.00–0.48)

## 2013-11-07 LAB — URINALYSIS, ROUTINE W REFLEX MICROSCOPIC
Bilirubin Urine: NEGATIVE
GLUCOSE, UA: NEGATIVE mg/dL
Ketones, ur: NEGATIVE mg/dL
Nitrite: NEGATIVE
Protein, ur: 100 mg/dL — AB
Specific Gravity, Urine: 1.02 (ref 1.005–1.030)
Urobilinogen, UA: 0.2 mg/dL (ref 0.0–1.0)
pH: 6 (ref 5.0–8.0)

## 2013-11-07 LAB — DIC (DISSEMINATED INTRAVASCULAR COAGULATION)PANEL
D-Dimer, Quant: 2.42 ug/mL-FEU — ABNORMAL HIGH (ref 0.00–0.48)
Fibrinogen: 621 mg/dL — ABNORMAL HIGH (ref 204–475)
Platelets: ADEQUATE 10*3/uL (ref 150–400)
Smear Review: NONE SEEN

## 2013-11-07 LAB — OCCULT BLOOD, POC DEVICE: Fecal Occult Bld: NEGATIVE

## 2013-11-07 LAB — PREPARE RBC (CROSSMATCH)

## 2013-11-07 LAB — LIPASE, BLOOD: Lipase: 20 U/L (ref 11–59)

## 2013-11-07 MED ORDER — ACETAMINOPHEN 650 MG RE SUPP
650.0000 mg | Freq: Four times a day (QID) | RECTAL | Status: DC | PRN
  Administered 2013-12-01: 650 mg via RECTAL
  Filled 2013-11-07: qty 1

## 2013-11-07 MED ORDER — SODIUM CHLORIDE 0.9 % IV SOLN
INTRAVENOUS | Status: DC
  Administered 2013-11-07: 18:00:00 via INTRAVENOUS

## 2013-11-07 MED ORDER — OXYCODONE HCL 5 MG PO TABS
5.0000 mg | ORAL_TABLET | ORAL | Status: DC | PRN
  Administered 2013-11-13 – 2013-11-20 (×2): 5 mg via ORAL
  Filled 2013-11-07 (×2): qty 1

## 2013-11-07 MED ORDER — MORPHINE SULFATE 2 MG/ML IJ SOLN
2.0000 mg | Freq: Once | INTRAMUSCULAR | Status: AC
  Administered 2013-11-07: 2 mg via INTRAVENOUS
  Filled 2013-11-07: qty 1

## 2013-11-07 MED ORDER — CIPROFLOXACIN IN D5W 400 MG/200ML IV SOLN
400.0000 mg | Freq: Two times a day (BID) | INTRAVENOUS | Status: DC
  Administered 2013-11-08 – 2013-11-10 (×6): 400 mg via INTRAVENOUS
  Filled 2013-11-07 (×7): qty 200

## 2013-11-07 MED ORDER — IOHEXOL 300 MG/ML  SOLN
80.0000 mL | Freq: Once | INTRAMUSCULAR | Status: AC | PRN
  Administered 2013-11-07: 80 mL via INTRAVENOUS

## 2013-11-07 MED ORDER — ONDANSETRON HCL 4 MG PO TABS: 4.0000 mg | ORAL_TABLET | Freq: Four times a day (QID) | ORAL | Status: DC | PRN

## 2013-11-07 MED ORDER — ACETAMINOPHEN 325 MG PO TABS
650.0000 mg | ORAL_TABLET | Freq: Four times a day (QID) | ORAL | Status: DC | PRN
  Administered 2013-11-08 – 2013-12-01 (×9): 650 mg via ORAL
  Filled 2013-11-07 (×12): qty 2

## 2013-11-07 MED ORDER — IOHEXOL 350 MG/ML SOLN
100.0000 mL | Freq: Once | INTRAVENOUS | Status: AC | PRN
  Administered 2013-11-07: 100 mL via INTRAVENOUS

## 2013-11-07 MED ORDER — IOHEXOL 300 MG/ML  SOLN
25.0000 mL | Freq: Once | INTRAMUSCULAR | Status: AC | PRN
  Administered 2013-11-07: 25 mL via ORAL

## 2013-11-07 MED ORDER — DULOXETINE HCL 60 MG PO CPEP
60.0000 mg | ORAL_CAPSULE | Freq: Every evening | ORAL | Status: DC
  Administered 2013-11-08 – 2013-12-24 (×45): 60 mg via ORAL
  Filled 2013-11-07 (×49): qty 1

## 2013-11-07 MED ORDER — DEXTROSE 5 % IV SOLN
2.0000 g | Freq: Once | INTRAVENOUS | Status: AC
  Administered 2013-11-08: 2 g via INTRAVENOUS
  Filled 2013-11-07: qty 2

## 2013-11-07 MED ORDER — ONDANSETRON HCL 4 MG/2ML IJ SOLN
4.0000 mg | Freq: Once | INTRAMUSCULAR | Status: AC
  Administered 2013-11-07: 4 mg via INTRAVENOUS
  Filled 2013-11-07: qty 2

## 2013-11-07 MED ORDER — GABAPENTIN 300 MG PO CAPS
600.0000 mg | ORAL_CAPSULE | Freq: Two times a day (BID) | ORAL | Status: DC
  Administered 2013-11-08 – 2013-11-21 (×29): 600 mg via ORAL
  Filled 2013-11-07 (×31): qty 2

## 2013-11-07 MED ORDER — SODIUM CHLORIDE 0.9 % IJ SOLN
3.0000 mL | Freq: Two times a day (BID) | INTRAMUSCULAR | Status: DC
  Administered 2013-11-08 – 2013-11-15 (×7): 3 mL via INTRAVENOUS
  Administered 2013-11-19 (×3): via INTRAVENOUS
  Administered 2013-11-20 – 2013-12-23 (×17): 3 mL via INTRAVENOUS

## 2013-11-07 MED ORDER — SODIUM CHLORIDE 0.9 % IV SOLN
INTRAVENOUS | Status: DC
  Administered 2013-11-08 – 2013-11-16 (×16): via INTRAVENOUS
  Administered 2013-11-19: 1000 mL via INTRAVENOUS
  Administered 2013-11-23 (×2): via INTRAVENOUS

## 2013-11-07 MED ORDER — SODIUM CHLORIDE 0.9 % IV BOLUS (SEPSIS)
1000.0000 mL | Freq: Once | INTRAVENOUS | Status: AC
  Administered 2013-11-07: 1000 mL via INTRAVENOUS

## 2013-11-07 MED ORDER — CIPROFLOXACIN IN D5W 400 MG/200ML IV SOLN
400.0000 mg | Freq: Once | INTRAVENOUS | Status: AC
  Administered 2013-11-07: 400 mg via INTRAVENOUS
  Filled 2013-11-07: qty 200

## 2013-11-07 MED ORDER — HYDROMORPHONE HCL PF 1 MG/ML IJ SOLN
0.5000 mg | Freq: Once | INTRAMUSCULAR | Status: AC
  Administered 2013-11-07: 0.5 mg via INTRAVENOUS
  Filled 2013-11-07: qty 1

## 2013-11-07 MED ORDER — MENTHOL 3 MG MT LOZG
1.0000 | LOZENGE | OROMUCOSAL | Status: DC | PRN
  Administered 2013-11-24: 3 mg via ORAL
  Filled 2013-11-07: qty 9

## 2013-11-07 MED ORDER — BENZONATATE 100 MG PO CAPS
100.0000 mg | ORAL_CAPSULE | Freq: Three times a day (TID) | ORAL | Status: DC | PRN
  Administered 2013-11-08 – 2013-11-10 (×4): 100 mg via ORAL
  Filled 2013-11-07 (×5): qty 1

## 2013-11-07 MED ORDER — ONDANSETRON HCL 4 MG/2ML IJ SOLN
4.0000 mg | Freq: Four times a day (QID) | INTRAMUSCULAR | Status: DC | PRN
  Administered 2013-11-13 – 2013-11-28 (×7): 4 mg via INTRAVENOUS
  Filled 2013-11-07 (×8): qty 2

## 2013-11-07 NOTE — ED Notes (Signed)
Patient c/o intermittent N/V/D and RLQ pain x 13 days. Patient also c/o red rash on lower abdomen and thighs that started after she took a shower today. Patient also states that she has had a non productive cough x 1 month/

## 2013-11-07 NOTE — ED Notes (Signed)
Patient transported to CT 

## 2013-11-07 NOTE — ED Provider Notes (Signed)
CSN: [id number]     Arrival date & time 11/22/2013  1659 History   First MD Initiated Contact with Patient 11/05/2013 1714     Chief Complaint  Patient presents with  . Emesis  . Diarrhea  . Abdominal Pain     (Consider location/radiation/quality/duration/timing/severity/associated sxs/prior Treatment) The history is provided by the patient. No language interpreter was used.  Lacey [name] is a 57 year old female with past medical history of Hodgkin's lymphoma currently 4.5 years in remission with recent preleukemia diagnosis in October 2014 presenting to emergency department with nausea, vomiting, diarrhea, abdominal pain, chest pain, shortness of breath, fever. Patient reported that the abdominal pain, nausea and vomiting has been ongoing since 10/25/2013. Reported that the abdominal pain is localized to the right lower quadrant and described as a shooting, stabbing pain that has now become constant without radiation. Stated that she's been feeling nauseous continuously. Stated that she's been having dry heaving episodes with at least 6 episodes of emesis today mainly of food contents. Stated that she's been unable to keep any food or fluids down for the past 2 days. Stated that she's been having intermittent diarrhea. Reported that she's also been having chest pain described as a pressure sensation to the center of her chest without radiation with associated symptoms of shortness of breath that worsens with exertion. Stated that she's never had this issue before. Reported that she's been having increased fatigue. Stated that she is also been having a wet cough that is productive with yellowish phlegm with positive nasal congestion. Stated that when she got out of the shower this afternoon she noticed a rash localized to her buttocks and her right lower abdomen and upper right thigh-reported that there is no pruritis - reported that she has no discomfort to the rash. Stated that approximately one  week ago when she was on a cruise she noticed a black tarry stools but has not been present since then. Patient reported that she's been having intermittent fevers over the course the past couple of days the highest was yesterday 102.31F-reported that she took Tylenol with minimal relief. Denied changes to fabric softeners/soap, medicated, sick contacts, headache, dizziness. PCP Dr. Alyson Ingles  Past Medical History  Diagnosis Date  . Cancer   . Cough productive of clear sputum 01/04/2013  . Fibromyalgia    Past Surgical History  Procedure Laterality Date  . Bone marrow biopsy  05/20110  . Spine surgery  1998    c-4 c 5 fusion  . Hodgkin     Family History  Problem Relation Age of Onset  . Cancer Mother   . Cancer Father    History  Substance Use Topics  . Smoking status: Never Smoker   . Smokeless tobacco: Never Used  . Alcohol Use: Yes     Comment: occasionally   OB History   Grav Para Term Preterm Abortions TAB SAB Ect Mult Living                 Review of Systems  Constitutional: Positive for fever. Negative for chills.  HENT: Positive for congestion. Negative for trouble swallowing.   Respiratory: Positive for cough and shortness of breath. Negative for chest tightness.   Cardiovascular: Positive for chest pain.  Gastrointestinal: Positive for nausea, vomiting, abdominal pain, diarrhea and blood in stool. Negative for constipation and anal bleeding.  Genitourinary: Negative for dysuria and decreased urine volume.  Musculoskeletal: Negative for back pain.  Skin: Positive for rash.  Neurological: Positive for weakness.  Negative for dizziness.  All other systems reviewed and are negative.      Allergies  Penicillins  Home Medications   Current Outpatient Rx  Name  Route  Sig  Dispense  Refill  . cholecalciferol (VITAMIN D) 1000 UNITS tablet   Oral   Take 1,000 Units by mouth daily.         . DULoxetine (CYMBALTA) 60 MG capsule   Oral   Take 60 mg by mouth  every evening.         . gabapentin (NEURONTIN) 300 MG capsule   Oral   Take 600 mg by mouth 2 (two) times daily.          Nyoka Cowden Tea 250 MG CAPS   Oral   Take by mouth every morning.         Marland Kitchen amoxicillin (AMOXIL) 500 MG capsule   Oral   Take 500 mg by mouth 3 (three) times daily. For 7 days.          BP 129/58  Pulse 106  Temp(Src) 98.4 F (36.9 C) (Oral)  Resp 18  Ht 5' (1.524 m)  Wt 184 lb (83.462 kg)  BMI 35.94 kg/m2  SpO2 96% Physical Exam  Nursing note and vitals reviewed. Constitutional: She is oriented to person, place, and time. She appears well-developed and well-nourished. No distress.  HENT:  Head: Normocephalic and atraumatic.  Dry mucous membranes  Eyes: Conjunctivae and EOM are normal. Pupils are equal, round, and reactive to light. Right eye exhibits no discharge. Left eye exhibits no discharge.  Neck: Normal range of motion. Neck supple. No tracheal deviation present.  Negative neck stiffness Negative nuchal rigidity Negative cervical adenopathy Negative meningeal signs  Cardiovascular: Normal rate, regular rhythm and normal heart sounds.  Exam reveals no friction rub.   No murmur heard. Pulmonary/Chest: Effort normal. No respiratory distress. She has wheezes. She has no rales.  Patient is able to speak in full sentences without difficulty  Expiratory wheezes localized upper and lower lobes bilaterally Negative use of accessory muscles  Abdominal: Soft. Normal appearance and bowel sounds are normal. There is tenderness in the right lower quadrant. There is no guarding.    Diffuse tenderness upon palpation to the abdomen most discomfort localized to the right aspect-right lower quadrant  Genitourinary:  Rectal Exam: Negative swelling, erythema, inflammation, lesions, sores, external hemorrhoids noted to anus. Negative internal hemorrhoids palpated. Negative masses, lesions, sores, fissures noted. Negative blood on glove. Brown stools on  glove. Exam chaperoned with nurse  Musculoskeletal: Normal range of motion.  Full ROM to upper and lower extremities without difficulty noted, negative ataxia noted.  Lymphadenopathy:    She has no cervical adenopathy.  Neurological: She is alert and oriented to person, place, and time. She exhibits normal muscle tone. Coordination normal.  Cranial nerves III-XII grossly intact Strength 5+/5+ to upper and lower extremities bilaterally with resistance applied, equal distribution noted.  Skin: Skin is warm. Rash noted. She is not diaphoretic.  Macules of erythema identified to the buttocks bilaterally and right aspect of the lower abdomen and right upper thigh. Number interval. Negative pain upon palpation. Negative bruising, swelling. Definitive borders identified.  Psychiatric: She has a normal mood and affect. Her behavior is normal. Thought content normal.    ED Course  Procedures (including critical care time)  10:17 PM This provider spoke with the patient in great detail regarding labs and imaging results. Patient to be admitted to the hospital regarding pyelonephritis, hemolytic anemia,  fever, history of Hodgkin's lymphoma and preleukemia. Patient is a cancer patient with fever - patient to be admitted to hospital.  10:28 PM This provider spoke with Dr. Posey Pronto, [facility] - discussed case, history, presentation, labs, imaging in great detail. Patient to be admitted to the hospital to telemetry floor. As per admitting physician, recommended cortisol level, DIC panel, type and screen, one unit of packed red blood cells to be administered secondary to hemoglobin being 7.0. Patient to be seen and assessed.   Results for orders placed during the hospital encounter of 11/21/2013  CBC WITH DIFFERENTIAL      Result Value Range   WBC 11.4 (*) 4.0 - 10.5 K/uL   RBC 2.28 (*) 3.87 - 5.11 MIL/uL   Hemoglobin 7.0 (*) 12.0 - 15.0 g/dL   HCT 22.4 (*) 36.0 - 46.0 %   MCV 98.2  78.0 - 100.0 fL    MCH 30.7  26.0 - 34.0 pg   MCHC 31.3  30.0 - 36.0 g/dL   RDW 20.2 (*) 11.5 - 15.5 %   Platelets    150 - 400 K/uL   Value: PLATELET CLUMPS NOTED ON SMEAR, COUNT APPEARS ADEQUATE   Neutrophils Relative % 73  43 - 77 %   Lymphocytes Relative 11 (*) 12 - 46 %   Monocytes Relative 14 (*) 3 - 12 %   Eosinophils Relative 1  0 - 5 %   Basophils Relative 1  0 - 1 %   Neutro Abs 8.3 (*) 1.7 - 7.7 K/uL   Lymphs Abs 1.3  0.7 - 4.0 K/uL   Monocytes Absolute 1.6 (*) 0.1 - 1.0 K/uL   Eosinophils Absolute 0.1  0.0 - 0.7 K/uL   Basophils Absolute 0.1  0.0 - 0.1 K/uL   RBC Morphology RARE NRBCs     WBC Morphology VACUOLATED NEUTROPHILS     Smear Review LARGE PLATELETS PRESENT    COMPREHENSIVE METABOLIC PANEL      Result Value Range   Sodium 138  137 - 147 mEq/L   Potassium 3.9  3.7 - 5.3 mEq/L   Chloride 99  96 - 112 mEq/L   CO2 22  19 - 32 mEq/L   Glucose, Bld 152 (*) 70 - 99 mg/dL   BUN 14  6 - 23 mg/dL   Creatinine, Ser 1.08  0.50 - 1.10 mg/dL   Calcium 9.0  8.4 - 10.5 mg/dL   Total Protein 7.9  6.0 - 8.3 g/dL   Albumin 3.0 (*) 3.5 - 5.2 g/dL   AST 16  0 - 37 U/L   ALT 16  0 - 35 U/L   Alkaline Phosphatase 67  39 - 117 U/L   Total Bilirubin 0.8  0.3 - 1.2 mg/dL   GFR calc non Af Amer 55 (*) >90 mL/min   GFR calc Af Amer 64 (*) >90 mL/min  LIPASE, BLOOD      Result Value Range   Lipase 20  11 - 59 U/L  URINALYSIS, ROUTINE W REFLEX MICROSCOPIC      Result Value Range   Color, Urine YELLOW  YELLOW   APPearance CLOUDY (*) CLEAR   Specific Gravity, Urine 1.020  1.005 - 1.030   pH 6.0  5.0 - 8.0   Glucose, UA NEGATIVE  NEGATIVE mg/dL   Hgb urine dipstick TRACE (*) NEGATIVE   Bilirubin Urine NEGATIVE  NEGATIVE   Ketones, ur NEGATIVE  NEGATIVE mg/dL   Protein, ur 100 (*) NEGATIVE mg/dL   Urobilinogen,  UA 0.2  0.0 - 1.0 mg/dL   Nitrite NEGATIVE  NEGATIVE   Leukocytes, UA TRACE (*) NEGATIVE  D-DIMER, QUANTITATIVE      Result Value Range   D-Dimer, Quant 2.41 (*) 0.00 - 0.48 ug/mL-FEU   TROPONIN I      Result Value Range   Troponin I <0.30  <0.30 ng/mL  URINE MICROSCOPIC-ADD ON      Result Value Range   Squamous Epithelial / LPF RARE  RARE   WBC, UA 21-50  <3 WBC/hpf   RBC / HPF 0-2  <3 RBC/hpf   Bacteria, UA FEW (*) RARE   Casts HYALINE CASTS (*) NEGATIVE  TROPONIN I      Result Value Range   Troponin I <0.30  <0.30 ng/mL  OCCULT BLOOD, POC DEVICE      Result Value Range   Fecal Occult Bld NEGATIVE  NEGATIVE   Dg Chest 2 View  11/19/2013   CLINICAL DATA:  Cough, fever and shortness of breath.  EXAM: CHEST  2 VIEW  COMPARISON:  PA and lateral chest 01/04/2013 and 11/27/2011.  FINDINGS: Right suprahilar fullness is unchanged. Lungs appear clear. Elevation of the right hemidiaphragm is again seen. There is no pneumothorax or pleural effusion. Heart size is normal.  IMPRESSION: No acute disease.  Stable compared to prior exam.   Electronically Signed   By: Inge Rise M.D.   On: 11/22/2013 18:13   Ct Angio Chest Pe W/cm &/or Wo Cm  11/18/2013   CLINICAL DATA:  New diagnosis of leukemia. History of non-Hodgkin's lymphoma. Shortness of breath and elevated D-dimer.  EXAM: CT ANGIOGRAPHY CHEST WITH CONTRAST  TECHNIQUE: Multidetector CT imaging of the chest was performed using the standard protocol during bolus administration of intravenous contrast. Multiplanar CT image reconstructions and MIPs were obtained to evaluate the vascular anatomy.  CONTRAST:  100 mL OMNIPAQUE IOHEXOL 350 MG/ML SOLN  COMPARISON:  PET CT scan 11/12/2008 and CT chest 01/25/2008.  FINDINGS: Although this study is somewhat degraded by respiratory motion, no pulmonary embolus is identified. Heart size is mildly enlarged. No pleural or pericardial effusion is identified. No axillary, hilar or mediastinal lymphadenopathy is seen. Lungs demonstrate a small focus of airspace opacity in the anterior aspect of the right upper lobe which is unchanged compared to the prior PET CT scan and is most consistent with  scarring. The lungs are otherwise clear. Incidentally imaged upper abdomen shows unchanged low attenuating lesions in the liver consistent with cysts. No lytic or sclerotic bony lesion is identified.  Review of the MIP images confirms the above findings.  IMPRESSION: Negative for pulmonary embolus.  No acute finding.  Cardiomegaly.   Electronically Signed   By: Inge Rise M.D.   On: 11/18/2013 19:56   Ct Abdomen Pelvis W Contrast  11/19/2013   CLINICAL DATA:  Intermittent nausea, vomiting and diarrhea. Right lower quadrant pain for 13 days.  EXAM: CT ABDOMEN AND PELVIS WITH CONTRAST  TECHNIQUE: Multidetector CT imaging of the abdomen and pelvis was performed using the standard protocol following bolus administration of intravenous contrast.  CONTRAST:  35m OMNIPAQUE IOHEXOL 300 MG/ML  SOLN  COMPARISON:  CT of the chest 04/21/2007  FINDINGS: There is elevation of the right hemidiaphragm. And the lung bases are unremarkable in appearance.  Numerous low-attenuation lesions are identified throughout the liver, most consistent with cysts. No focal abnormality identified within the spleen, pancreas, left adrenal gland, or left kidney.  There is new stranding in the region of the  right adrenal gland which now appears somewhat ill defined and diffusely low-attenuation without focal lesion. Differential diagnosis includes infectious/inflammatory process or early hemorrhage. There is some associated stranding surrounding the upper pole of the right kidney but otherwise, the right kidney has a normal appearance. There is normal bilateral renal excretion. The gallbladder is present.  The stomach and small bowel loops are normal in wall thickness and caliber. The appendix is well seen and has a normal appearance. There scattered colonic diverticula otherwise, the colon has a normal appearance.  Small aortocaval lymph node is 9 mm. Small left femoral lymph node is 14 mm.  There are mild degenerative changes in the  spine. No suspicious lytic or blastic lesions are identified.  IMPRESSION: 1. Significant change in the appearance of the right adrenal, with diffuse low-attenuation and significant stranding in the retroperitoneum around the adrenal gland. Considerations include infectious or inflammatory process or early hemorrhage. 2. The left adrenal gland has a normal appearance. 3. Normal appendix. 4. Numerous liver cysts, stable in appearance.   Electronically Signed   By: Shon Hale M.D.   On: 11/01/2013 21:50    Labs Review Labs Reviewed  CBC WITH DIFFERENTIAL - Abnormal; Notable for the following:    WBC 11.4 (*)    RBC 2.28 (*)    Hemoglobin 7.0 (*)    HCT 22.4 (*)    RDW 20.2 (*)    Lymphocytes Relative 11 (*)    Monocytes Relative 14 (*)    Neutro Abs 8.3 (*)    Monocytes Absolute 1.6 (*)    All other components within normal limits  COMPREHENSIVE METABOLIC PANEL - Abnormal; Notable for the following:    Glucose, Bld 152 (*)    Albumin 3.0 (*)    GFR calc non Af Amer 55 (*)    GFR calc Af Amer 64 (*)    All other components within normal limits  URINALYSIS, ROUTINE W REFLEX MICROSCOPIC - Abnormal; Notable for the following:    APPearance CLOUDY (*)    Hgb urine dipstick TRACE (*)    Protein, ur 100 (*)    Leukocytes, UA TRACE (*)    All other components within normal limits  D-DIMER, QUANTITATIVE - Abnormal; Notable for the following:    D-Dimer, Quant 2.41 (*)    All other components within normal limits  URINE MICROSCOPIC-ADD ON - Abnormal; Notable for the following:    Bacteria, UA FEW (*)    Casts HYALINE CASTS (*)    All other components within normal limits  LIPASE, BLOOD  TROPONIN I  TROPONIN I  PATHOLOGIST SMEAR REVIEW  CORTISOL  DIC (DISSEMINATED INTRAVASCULAR COAGULATION) PANEL  OCCULT BLOOD, POC DEVICE  TYPE AND SCREEN  PREPARE RBC (CROSSMATCH)   Imaging Review Dg Chest 2 View  11/06/2013   CLINICAL DATA:  Cough, fever and shortness of breath.  EXAM: CHEST  2  VIEW  COMPARISON:  PA and lateral chest 01/04/2013 and 11/27/2011.  FINDINGS: Right suprahilar fullness is unchanged. Lungs appear clear. Elevation of the right hemidiaphragm is again seen. There is no pneumothorax or pleural effusion. Heart size is normal.  IMPRESSION: No acute disease.  Stable compared to prior exam.   Electronically Signed   By: Inge Rise M.D.   On: 11/25/2013 18:13   Ct Angio Chest Pe W/cm &/or Wo Cm  11/13/2013   CLINICAL DATA:  New diagnosis of leukemia. History of non-Hodgkin's lymphoma. Shortness of breath and elevated D-dimer.  EXAM: CT ANGIOGRAPHY CHEST WITH  CONTRAST  TECHNIQUE: Multidetector CT imaging of the chest was performed using the standard protocol during bolus administration of intravenous contrast. Multiplanar CT image reconstructions and MIPs were obtained to evaluate the vascular anatomy.  CONTRAST:  100 mL OMNIPAQUE IOHEXOL 350 MG/ML SOLN  COMPARISON:  PET CT scan 11/12/2008 and CT chest 01/25/2008.  FINDINGS: Although this study is somewhat degraded by respiratory motion, no pulmonary embolus is identified. Heart size is mildly enlarged. No pleural or pericardial effusion is identified. No axillary, hilar or mediastinal lymphadenopathy is seen. Lungs demonstrate a small focus of airspace opacity in the anterior aspect of the right upper lobe which is unchanged compared to the prior PET CT scan and is most consistent with scarring. The lungs are otherwise clear. Incidentally imaged upper abdomen shows unchanged low attenuating lesions in the liver consistent with cysts. No lytic or sclerotic bony lesion is identified.  Review of the MIP images confirms the above findings.  IMPRESSION: Negative for pulmonary embolus.  No acute finding.  Cardiomegaly.   Electronically Signed   By: Inge Rise M.D.   On: 11/09/2013 19:56   Ct Abdomen Pelvis W Contrast  11/10/2013   CLINICAL DATA:  Intermittent nausea, vomiting and diarrhea. Right lower quadrant pain for 13  days.  EXAM: CT ABDOMEN AND PELVIS WITH CONTRAST  TECHNIQUE: Multidetector CT imaging of the abdomen and pelvis was performed using the standard protocol following bolus administration of intravenous contrast.  CONTRAST:  44m OMNIPAQUE IOHEXOL 300 MG/ML  SOLN  COMPARISON:  CT of the chest 04/21/2007  FINDINGS: There is elevation of the right hemidiaphragm. And the lung bases are unremarkable in appearance.  Numerous low-attenuation lesions are identified throughout the liver, most consistent with cysts. No focal abnormality identified within the spleen, pancreas, left adrenal gland, or left kidney.  There is new stranding in the region of the right adrenal gland which now appears somewhat ill defined and diffusely low-attenuation without focal lesion. Differential diagnosis includes infectious/inflammatory process or early hemorrhage. There is some associated stranding surrounding the upper pole of the right kidney but otherwise, the right kidney has a normal appearance. There is normal bilateral renal excretion. The gallbladder is present.  The stomach and small bowel loops are normal in wall thickness and caliber. The appendix is well seen and has a normal appearance. There scattered colonic diverticula otherwise, the colon has a normal appearance.  Small aortocaval lymph node is 9 mm. Small left femoral lymph node is 14 mm.  There are mild degenerative changes in the spine. No suspicious lytic or blastic lesions are identified.  IMPRESSION: 1. Significant change in the appearance of the right adrenal, with diffuse low-attenuation and significant stranding in the retroperitoneum around the adrenal gland. Considerations include infectious or inflammatory process or early hemorrhage. 2. The left adrenal gland has a normal appearance. 3. Normal appendix. 4. Numerous liver cysts, stable in appearance.   Electronically Signed   By: BShon HaleM.D.   On: 11/19/2013 21:50    EKG Interpretation     Date/Time:  Tuesday November 07 2013 18:10:32 EST Ventricular Rate:  101 PR Interval:  149 QRS Duration: 82 QT Interval:  356 QTC Calculation: 461 R Axis:   19 Text Interpretation:  Sinus tachycardia Baseline wander in lead(s) V3 V4 Confirmed by WARD  DO, KRISTEN (([id number] on 11/12/2013 6:13:15 PM            MDM   Final diagnoses:  Pyelonephritis  Fever  Hemolytic anemia  Preleukemia  Hodgkin  lymphoma   Medications  0.9 %  sodium chloride infusion ( Intravenous New Bag/Given 10/30/2013 1745)  ciprofloxacin (CIPRO) IVPB 400 mg (400 mg Intravenous New Bag/Given 11/20/2013 2219)  sodium chloride 0.9 % bolus 1,000 mL (not administered)  ondansetron (ZOFRAN) injection 4 mg (4 mg Intravenous Given 11/11/2013 1811)  iohexol (OMNIPAQUE) 350 MG/ML injection 100 mL (100 mLs Intravenous Contrast Given 11/09/2013 1930)  HYDROmorphone (DILAUDID) injection 0.5 mg (0.5 mg Intravenous Given 11/08/2013 1950)  ondansetron (ZOFRAN) injection 4 mg (4 mg Intravenous Given 11/10/2013 2012)  iohexol (OMNIPAQUE) 300 MG/ML solution 25 mL (25 mLs Oral Contrast Given 11/12/2013 2043)  iohexol (OMNIPAQUE) 300 MG/ML solution 80 mL (80 mLs Intravenous Contrast Given 11/14/2013 2116)  ondansetron (ZOFRAN) injection 4 mg (4 mg Intravenous Given 11/06/2013 2214)  morphine 2 MG/ML injection 2 mg (2 mg Intravenous Given 11/13/2013 2214)   Filed Vitals:   11/15/2013 1830 11/15/2013 1900 11/14/2013 1921 11/01/2013 2131  BP: 122/66 126/72 126/72 129/58  Pulse: 97 96 98 106  Temp:    98.4 F (36.9 C)  TempSrc:    Oral  Resp: _0 Height:      Weight:      SpO2: 98% 92% 94% 96%    Patient presenting to emergency department multiple complaints. Abdominal pain, nausea, vomiting has been ongoing since 10/17/2013 with worsening over the past couple of days. Abdominal pain localized right lower quadrant described as a sharp, stabbing pain without radiation has been, constant. Reported constant nausea with at least 6 episodes of emesis today  mainly of food and fluid contents. Reported that she's been unable to eat or drink anything for the past 2 days-reported that all comes up. Reported that she's been experiencing chest pain localize the Center for chest described as a pressure sensation without radiation with associated shortness of breath with exertion that is increased over the past 2 days. Reported that she's been having diarrhea intermittently. Reported increased fatigue. Patient is currently in remission for Hodgkin's lymphoma for the past 4-1/2 years-reported that she was recently diagnosed with preleukemia in October 2014, currently getting blood work every 3 weeks being followed by [place]. Alert and oriented. GCS 15. Heart rate and rhythm normal. Lungs noted to have expiratory wheezes to upper and lower lobes bilaterally-patient able to speak in full sentences without difficulty, negative use of accessory muscles. Cap refill less than 3 seconds. Negative swelling or pitting edema noted to the lower extremities bilaterally. Full range of motion to upper and lower extremities bilaterally without difficulty or ataxia. Strength intact-equal grip strength. Bowel sounds normoactive in all 4 quadrants-discomfort upon palpation to the abdomen diffusely with most discomfort upon palpation to right lower quadrant. Negative acute abdomen, negative peritoneal signs-nonsurgical abdomen noted. Rash noted to the right lower quadrant, right upper thigh and buttocks region that is erythematous macules with negative raise-borders identified-negative swelling noted. EKG noted sinus tachycardia with a heart rate of 101 beats per minute-negative ischemic findings noted. Troponin negative elevation. Lipase negative elevation. CMP negative findings. CBC noted mild elevated White blood cell count 11.4.  Hgb noted to be 7.0 - drop noted when compared to 3 weeks ago at 8.8. Drop in RBCs from 2.73 to 2.28.Fecal occult negative. Chest x-ray negative for acute  cardiac pulmonary disease-negative findings. Elevated D-dimer of 2.41. Urinalysis noted trace of leukocytes with 21-50 white blood cells-pyuria noted. CT angio of chest negative for pulmonary embolus. CT abdomen and pelvis noted significant change patient the right adrenal gland with  stranding around the adrenal gland-consideration of infectious versus inflammatory process, early hemorrhage. Patient placed on IV fluids in numerous amounts of anti-emetics administered to patient while in ED setting-pain and nausea controlled. Patient given IV antibiotics secondary to pyelonephritis. CT noted possible inflammatory versus infectious process of the right adrenal gland-infection from the kidney can be spreading up to the adrenal gland. Discussed case with Dr. Posey Pronto, [facility]-recommended patient to be admitted to the hospital to telemetry floor. As per admitting physician, recommended cortisol level to be drawn, DIC panel, type and screen, one unit of packed red blood cells to be administered to the patient secondary to drop in hemoglobin over the course of 3 weeks. Discussed plan for admission to the patient-patient agreed to plan. Patient agreed to plan of admission. Patient stable. Patient stable for transfer.  Jamse Mead, PA-C 11/08/13 1305

## 2013-11-07 NOTE — ED Notes (Signed)
Patient transported to X-ray 

## 2013-11-07 NOTE — H&P (Signed)
[facility] History and Physical  Patient: Lacey [name]  VLV:[id number]  DOB: October 14, 1955  DOS: the patient was seen and examined on 10/30/2013 PCP: Lolita Patella, MD  Chief Complaint: Nausea vomiting and abdominal pain  HPI: Lacey [name] is a 58 y.o. female with Past medical history of Hodgkin's lymphoma status post chemotherapy in the pas has myelodysplastic syndrome. The patient is coming from home. Patient presents with complaints of nausea vomiting and right-sided abdominal pain. The patient mentions that her symptoms started a month ago with cough and yellowish expectoration for which she completed a course of amoxicillin despite which her cough was not getting better.since last one month she started having dyspnea on exertion which is progressively worsening.denies any orthopnea or PND. Since last one week she started having complaints of chest tightness on exertion along with dyspnea which resolved on rest. She is at present chest pain-free. She was on a cruise to [place] last week and off for going on cruise she started having episodes of black color bowel movements which are present on last Tuesday without any abdominal pain or nausea and resolved. She continues to have diarrhea then on and off but no melena or active bleeding. She got some medication and cruise. Since Saturday started having complaints of pain in her right lower abdomen which was sharp and was associated with nausea and was to 6 episodes of vomiting today. She has poor appetite and has not had any today. She's taking her medications at home and there is no change in her medication and last 1 month. She denies any burning urination but she did have a fever of 102.89F yesterday. She did have some bleeding from her nose on and off since last one month associated with redness and swelling of her nasal bridge with some blurring of vision which is present since last one week. She denies any  focal neurological deficit she has chronic orthostatic dizziness which has not changed.   Review of Systems: as mentioned in the history of present illness.  A Comprehensive review of the other systems is negative.  Past Medical History  Diagnosis Date  . Cancer   . Cough productive of clear sputum 01/04/2013  . Fibromyalgia    Past Surgical History  Procedure Laterality Date  . Bone marrow biopsy  05/20110  . Spine surgery  1998    c-4 c 5 fusion  . Hodgkin     Social History:  reports that she has never smoked. She has never used smokeless tobacco. She reports that she drinks alcohol. She reports that she does not use illicit drugs. Independent for most of her  ADL.  Allergies  Allergen Reactions  . Penicillins Hives and Rash    Family History  Problem Relation Age of Onset  . Cancer Mother   . Cancer Father     Prior to Admission medications   Medication Sig Start Date End Date Taking? Authorizing Provider  cholecalciferol (VITAMIN D) 1000 UNITS tablet Take 1,000 Units by mouth daily.   Yes Historical Provider, MD  DULoxetine (CYMBALTA) 60 MG capsule Take 60 mg by mouth every evening.   Yes Historical Provider, MD  gabapentin (NEURONTIN) 300 MG capsule Take 600 mg by mouth 2 (two) times daily.    Yes Historical Provider, MD  Chilton Si Tea 250 MG CAPS Take by mouth every morning.   Yes Historical Provider, MD  amoxicillin (AMOXIL) 500 MG capsule Take 500 mg by mouth 3 (three) times daily. For 7 days.  10/26/13   Historical Provider, MD    Physical Exam: Filed Vitals:   11/17/2013 1921 11/01/2013 2131 11/23/2013 2230 11/11/2013 2315  BP: 126/72 129/58 118/63 120/63  Pulse: 98 106  110  Temp:  98.4 F (36.9 C)  100.2 F (37.9 C)  TempSrc:  Oral  Oral  Resp: $Remo'20 18 24 20  'ElQwk$ Height:      Weight:      SpO2: 94% 96%  91%    General: Alert, Awake and Oriented to Time, Place and Person. Appear in moderate distress Eyes: PERRL ENT: Oral Mucosa clear, nasal bridge swelling and redness  with pain, . Neck: No JVD Cardiovascular: S1 and S2 Present, no  Murmur, Peripheral Pulses Present Respiratory: Bilateral Air entry equal and Decreased, no  Crackles,expiratory  wheezes Abdomen: Bowel Sound Present, Soft and minimal  tender right lower quadrant  Skin: Nonblanching nonvesicular diffuse maculopapular rash on lower abdomen and back Extremities: No  Pedal edema, no  calf tenderness Neurologic: Grossly Unremarkable. Labs on Admission:  CBC:  Recent Labs Lab 11/20/2013 1740 11/10/2013 2245  WBC 11.4*  --   NEUTROABS 8.3*  --   HGB 7.0*  --   HCT 22.4*  --   MCV 98.2  --   PLT PLATELET CLUMPS NOTED ON SMEAR, COUNT APPEARS ADEQUATE PENDING    CMP     Component Value Date/Time   NA 138 11/22/2013 1740   NA 146* 03/28/2009 0802   K 3.9 11/09/2013 1740   K 4.0 03/28/2009 0802   CL 99 11/04/2013 1740   CL 103 03/28/2009 0802   CO2 22 11/15/2013 1740   CO2 26 03/28/2009 0802   GLUCOSE 152* 11/10/2013 1740   GLUCOSE 112 03/28/2009 0802   BUN 14 11/09/2013 1740   BUN 11 03/28/2009 0802   CREATININE 1.08 11/06/2013 1740   CREATININE 1.1 03/28/2009 0802   CALCIUM 9.0 11/21/2013 1740   CALCIUM 9.8 03/28/2009 0802   PROT 7.9 11/24/2013 1740   PROT 7.9 11/19/2008 0818   ALBUMIN 3.0* 11/16/2013 1740   AST 16 11/06/2013 1740   AST 22 11/19/2008 0818   ALT 16 11/11/2013 1740   ALT 16 11/19/2008 0818   ALKPHOS 67 11/05/2013 1740   ALKPHOS 101* 11/19/2008 0818   BILITOT 0.8 11/24/2013 1740   BILITOT 0.40 11/19/2008 0818   GFRNONAA 55* 11/04/2013 1740   GFRAA 64* 11/24/2013 1740     Recent Labs Lab 11/11/2013 1740  LIPASE 20   No results found for this basename: AMMONIA,  in the last 168 hours   Recent Labs Lab 11/18/2013 1740 11/20/2013 2150  TROPONINI <0.30 <0.30   BNP (last 3 results) No results found for this basename: PROBNP,  in the last 8760 hours  Radiological Exams on Admission: Dg Chest 2 View  11/19/2013   CLINICAL DATA:  Cough, fever and shortness of breath.  EXAM: CHEST  2 VIEW   COMPARISON:  PA and lateral chest 01/04/2013 and 11/27/2011.  FINDINGS: Right suprahilar fullness is unchanged. Lungs appear clear. Elevation of the right hemidiaphragm is again seen. There is no pneumothorax or pleural effusion. Heart size is normal.  IMPRESSION: No acute disease.  Stable compared to prior exam.   Electronically Signed   By: Inge Rise M.D.   On: 11/06/2013 18:13   Ct Angio Chest Pe W/cm &/or Wo Cm  11/06/2013   CLINICAL DATA:  New diagnosis of leukemia. History of non-Hodgkin's lymphoma. Shortness of breath and elevated D-dimer.  EXAM: CT ANGIOGRAPHY CHEST  WITH CONTRAST  TECHNIQUE: Multidetector CT imaging of the chest was performed using the standard protocol during bolus administration of intravenous contrast. Multiplanar CT image reconstructions and MIPs were obtained to evaluate the vascular anatomy.  CONTRAST:  100 mL OMNIPAQUE IOHEXOL 350 MG/ML SOLN  COMPARISON:  PET CT scan 11/12/2008 and CT chest 01/25/2008.  FINDINGS: Although this study is somewhat degraded by respiratory motion, no pulmonary embolus is identified. Heart size is mildly enlarged. No pleural or pericardial effusion is identified. No axillary, hilar or mediastinal lymphadenopathy is seen. Lungs demonstrate a small focus of airspace opacity in the anterior aspect of the right upper lobe which is unchanged compared to the prior PET CT scan and is most consistent with scarring. The lungs are otherwise clear. Incidentally imaged upper abdomen shows unchanged low attenuating lesions in the liver consistent with cysts. No lytic or sclerotic bony lesion is identified.  Review of the MIP images confirms the above findings.  IMPRESSION: Negative for pulmonary embolus.  No acute finding.  Cardiomegaly.   Electronically Signed   By: Drusilla Kanner M.D.   On: 11/21/2013 19:56   Ct Abdomen Pelvis W Contrast  11/16/2013   CLINICAL DATA:  Intermittent nausea, vomiting and diarrhea. Right lower quadrant pain for 13 days.   EXAM: CT ABDOMEN AND PELVIS WITH CONTRAST  TECHNIQUE: Multidetector CT imaging of the abdomen and pelvis was performed using the standard protocol following bolus administration of intravenous contrast.  CONTRAST:  56mL OMNIPAQUE IOHEXOL 300 MG/ML  SOLN  COMPARISON:  CT of the chest 04/21/2007  FINDINGS: There is elevation of the right hemidiaphragm. And the lung bases are unremarkable in appearance.  Numerous low-attenuation lesions are identified throughout the liver, most consistent with cysts. No focal abnormality identified within the spleen, pancreas, left adrenal gland, or left kidney.  There is new stranding in the region of the right adrenal gland which now appears somewhat ill defined and diffusely low-attenuation without focal lesion. Differential diagnosis includes infectious/inflammatory process or early hemorrhage. There is some associated stranding surrounding the upper pole of the right kidney but otherwise, the right kidney has a normal appearance. There is normal bilateral renal excretion. The gallbladder is present.  The stomach and small bowel loops are normal in wall thickness and caliber. The appendix is well seen and has a normal appearance. There scattered colonic diverticula otherwise, the colon has a normal appearance.  Small aortocaval lymph node is 9 mm. Small left femoral lymph node is 14 mm.  There are mild degenerative changes in the spine. No suspicious lytic or blastic lesions are identified.  IMPRESSION: 1. Significant change in the appearance of the right adrenal, with diffuse low-attenuation and significant stranding in the retroperitoneum around the adrenal gland. Considerations include infectious or inflammatory process or early hemorrhage. 2. The left adrenal gland has a normal appearance. 3. Normal appendix. 4. Numerous liver cysts, stable in appearance.   Electronically Signed   By: Rosalie Gums M.D.   On: 11/06/2013 21:50   Assessment/Plan Principal Problem:    Pyelonephritis Active Problems:   Hodgkin lymphoma   Myelodysplasia   Adrenal abnormality   Rash and nonspecific skin eruption   Nasal swelling   Acute bronchitis   Chest tightness   DOE (dyspnea on exertion)   Symptomatic anemia   Melena   1. Pyelonephritis Patient is presenting with complaints of right-sided flank pain with nausea and vomiting and generalized tiredness. She had fever but no hypertension here in the hospital. Her CT scan of  the abdomen is suggestive of possible renal stranding in the right upper pole as well as inflammation versus hemorrhage in adrenal on right side with normal left adrenal. With this she will be admitted to telemetry, aggressive IV hydration, I would cover her with IV Cipro and IV aztreonam for dual Pseudomonas coverage. Blood cultures, urine cultures, sputum cultures and urine antigens. I will check serum cortisol level and if it is low I will place her on schedule dose of Solu Cortef. Monitor her serum BMP if further worsening may require further imaging.  2. Symptomatically anemia, melena and myelodysplastic syndrome The patient has history of melena one week ago which is resolved right now with negative Hemoccult. Her hemoglobin has trended downwards and is 7 today. She does have myelodysplastic syndrome but she has also developed chest tightness and dyspnea on exertion progressively worsening. I will transfuse her with one unit of PRBC and follow her H&H every 6 hours. Hemoccult x3. May require GI consult. [facility] consult oncology as well.  3. Skin eruptions The patient has macular papular skin eruptions that occurred after a warm water bath. The etiology currently is unclear but at present I would cover her broadly with dual Pseudomonas coverage and vancomycin. This is also due to presence of adrenal involvement.  4. Bronchitis Continue antibiotics, Tessalon Perles Mucinex.  5. Headache blurred vision and nasal bridge swelling. Possible  sinusitis but due to elevated d-dimer and complaining of blurred vision I will check CT of the head.  Consults: Medical hematology   DVT Prophylaxis: mechanical compression Nutrition: N.p.o.  Code Status: Full  Disposition: Admitted to inpatient in telemetry unit.  Author: Berle Mull, MD [facility] Pager: [phone] 11/15/2013, 11:27 PM    If 7PM-7AM, please contact night-coverage [email or web address] Password [email or web address]

## 2013-11-08 ENCOUNTER — Encounter (HOSPITAL_COMMUNITY): Payer: Self-pay | Admitting: Radiology

## 2013-11-08 ENCOUNTER — Inpatient Hospital Stay (HOSPITAL_COMMUNITY): Payer: BC Managed Care – PPO

## 2013-11-08 DIAGNOSIS — R0989 Other specified symptoms and signs involving the circulatory and respiratory systems: Secondary | ICD-10-CM

## 2013-11-08 DIAGNOSIS — R05 Cough: Secondary | ICD-10-CM

## 2013-11-08 DIAGNOSIS — C8190 Hodgkin lymphoma, unspecified, unspecified site: Secondary | ICD-10-CM

## 2013-11-08 DIAGNOSIS — D469 Myelodysplastic syndrome, unspecified: Secondary | ICD-10-CM

## 2013-11-08 DIAGNOSIS — R0609 Other forms of dyspnea: Secondary | ICD-10-CM

## 2013-11-08 DIAGNOSIS — R059 Cough, unspecified: Secondary | ICD-10-CM

## 2013-11-08 LAB — COMPREHENSIVE METABOLIC PANEL
ALT: 14 U/L (ref 0–35)
AST: 14 U/L (ref 0–37)
Albumin: 2.8 g/dL — ABNORMAL LOW (ref 3.5–5.2)
Alkaline Phosphatase: 62 U/L (ref 39–117)
BILIRUBIN TOTAL: 1 mg/dL (ref 0.3–1.2)
BUN: 14 mg/dL (ref 6–23)
CO2: 23 meq/L (ref 19–32)
Calcium: 8.4 mg/dL (ref 8.4–10.5)
Chloride: 101 mEq/L (ref 96–112)
Creatinine, Ser: 1.25 mg/dL — ABNORMAL HIGH (ref 0.50–1.10)
GFR, EST AFRICAN AMERICAN: 53 mL/min — AB (ref >90)
GFR, EST NON AFRICAN AMERICAN: 46 mL/min — AB (ref >90)
GLUCOSE: 126 mg/dL — AB (ref 70–99)
POTASSIUM: 4.3 meq/L (ref 3.7–5.3)
Sodium: 135 mEq/L — ABNORMAL LOW (ref 137–147)
TOTAL PROTEIN: 7.1 g/dL (ref 6.0–8.3)

## 2013-11-08 LAB — CBC
HEMATOCRIT: 23.5 % — AB (ref 36.0–46.0)
Hemoglobin: 7.5 g/dL — ABNORMAL LOW (ref 12.0–15.0)
MCH: 30.2 pg (ref 26.0–34.0)
MCHC: 31.9 g/dL (ref 30.0–36.0)
MCV: 94.8 fL (ref 78.0–100.0)
Platelets: 112 10*3/uL — ABNORMAL LOW (ref 150–400)
RBC: 2.48 MIL/uL — ABNORMAL LOW (ref 3.87–5.11)
RDW: 22.1 % — AB (ref 11.5–15.5)
WBC: 12.7 10*3/uL — ABNORMAL HIGH (ref 4.0–10.5)

## 2013-11-08 LAB — INFLUENZA PANEL BY PCR (TYPE A & B)
H1N1 flu by pcr: NOT DETECTED
INFLAPCR: NEGATIVE
Influenza B By PCR: NEGATIVE

## 2013-11-08 LAB — ERYTHROPOIETIN: Erythropoietin: 377.5 m[IU]/mL — ABNORMAL HIGH (ref 2.6–18.5)

## 2013-11-08 LAB — PROTIME-INR
INR: 1.25 (ref 0.00–1.49)
Prothrombin Time: 15.4 seconds — ABNORMAL HIGH (ref 11.6–15.2)

## 2013-11-08 LAB — CORTISOL: Cortisol, Plasma: 19.6 ug/dL

## 2013-11-08 MED ORDER — VANCOMYCIN HCL IN DEXTROSE 1-5 GM/200ML-% IV SOLN
1000.0000 mg | Freq: Once | INTRAVENOUS | Status: AC
  Administered 2013-11-08: 1000 mg via INTRAVENOUS
  Filled 2013-11-08: qty 200

## 2013-11-08 MED ORDER — IPRATROPIUM-ALBUTEROL 0.5-2.5 (3) MG/3ML IN SOLN
3.0000 mL | Freq: Four times a day (QID) | RESPIRATORY_TRACT | Status: DC
  Administered 2013-11-08 (×3): 3 mL via RESPIRATORY_TRACT
  Filled 2013-11-08 (×3): qty 3

## 2013-11-08 MED ORDER — DEXTROSE 5 % IV SOLN
1.0000 g | Freq: Three times a day (TID) | INTRAVENOUS | Status: DC
  Administered 2013-11-08 – 2013-11-10 (×7): 1 g via INTRAVENOUS
  Filled 2013-11-08 (×8): qty 1

## 2013-11-08 MED ORDER — DM-GUAIFENESIN ER 30-600 MG PO TB12
1.0000 | ORAL_TABLET | Freq: Two times a day (BID) | ORAL | Status: DC
  Administered 2013-11-08 – 2013-11-11 (×6): 1 via ORAL
  Filled 2013-11-08 (×7): qty 1

## 2013-11-08 MED ORDER — HYDROCORTISONE NA SUCCINATE PF 100 MG IJ SOLR
100.0000 mg | Freq: Three times a day (TID) | INTRAMUSCULAR | Status: AC
  Administered 2013-11-08 – 2013-11-09 (×6): 100 mg via INTRAVENOUS
  Filled 2013-11-08 (×7): qty 2

## 2013-11-08 MED ORDER — VANCOMYCIN HCL IN DEXTROSE 750-5 MG/150ML-% IV SOLN
750.0000 mg | Freq: Two times a day (BID) | INTRAVENOUS | Status: DC
  Administered 2013-11-08 – 2013-11-10 (×4): 750 mg via INTRAVENOUS
  Filled 2013-11-08 (×5): qty 150

## 2013-11-08 MED ORDER — IPRATROPIUM-ALBUTEROL 0.5-2.5 (3) MG/3ML IN SOLN
3.0000 mL | Freq: Four times a day (QID) | RESPIRATORY_TRACT | Status: DC
Start: 2013-11-09 — End: 2013-11-11
  Administered 2013-11-09 – 2013-11-10 (×8): 3 mL via RESPIRATORY_TRACT
  Filled 2013-11-08 (×8): qty 3

## 2013-11-08 NOTE — Consult Note (Signed)
HPI: Lacey [name] is an 58 y.o. female patient with hx of lymphoma. She has had ongoing progressive thrombocytopenia and had a BM biopsy back in 10/14. She has now been admitted with fever and UTI/pyelonephritis. Dr. Marin Olp has seen pt as well and requests repeat BM biopsy. Has also requested port placement as treatment is expected. She has had both BM biopsy and port procedure in the past. Port was subsequently removed. She is very familiar with both procedures. Chart, PMHx, med list reviewed. Currently on IV abx for UTI, Cx pending   Past Medical History:  Past Medical History  Diagnosis Date  . Cancer   . Cough productive of clear sputum 01/04/2013  . Fibromyalgia     Past Surgical History:  Past Surgical History  Procedure Laterality Date  . Bone marrow biopsy  05/20110  . Spine surgery  1998    c-4 c 5 fusion  . Hodgkin      Family History:  Family History  Problem Relation Age of Onset  . Cancer Mother   . Cancer Father     Social History:  reports that she has never smoked. She has never used smokeless tobacco. She reports that she drinks alcohol. She reports that she does not use illicit drugs.  Allergies:  Allergies  Allergen Reactions  . Penicillins Hives and Rash    Medications:   Medication List    ASK your doctor about these medications       amoxicillin 500 MG capsule  Commonly known as:  AMOXIL  Take 500 mg by mouth 3 (three) times daily. For 7 days.     cholecalciferol 1000 UNITS tablet  Commonly known as:  VITAMIN D  Take 1,000 Units by mouth daily.     DULoxetine 60 MG capsule  Commonly known as:  CYMBALTA  Take 60 mg by mouth every evening.     gabapentin 300 MG capsule  Commonly known as:  NEURONTIN  Take 600 mg by mouth 2 (two) times daily.     Green Tea 250 MG Caps  Take by mouth every morning.        Please HPI for pertinent positives, otherwise complete 10 system ROS negative.  Physical Exam: BP 109/57  Pulse 100   Temp(Src) 99.1 F (37.3 C) (Oral)  Resp 20  Ht 5' (1.524 m)  Wt 185 lb 13.6 oz (84.3 kg)  BMI 36.30 kg/m2  SpO2 94% Body mass index is 36.3 kg/(m^2).   General Appearance:  Alert, cooperative, no distress, appears stated age  Head:  Normocephalic, without obvious abnormality, atraumatic  ENT: Unremarkable  Neck: Supple, symmetrical, trachea midline  Lungs:   Clear to auscultation bilaterally, no w/r/r,  Chest Wall:  No tenderness or deformity  Heart:  Regular rate and rhythm, S1, S2 normal, no murmur, rub or gallop.  Abdomen:   Soft, non-tender, non distended.  Extremities: Extremities normal, atraumatic, no cyanosis or edema  Pulses: 2+ and symmetric  Neurologic: Normal affect, no gross deficits.   Results for orders placed during the hospital encounter of 11/16/2013 (from the past 48 hour(s))  CBC WITH DIFFERENTIAL     Status: Abnormal   Collection Time    11/18/2013  5:40 PM      Result Value Ref Range   WBC 11.4 (*) 4.0 - 10.5 K/uL   Comment: WHITE COUNT CONFIRMED ON SMEAR   RBC 2.28 (*) 3.87 - 5.11 MIL/uL   Hemoglobin 7.0 (*) 12.0 - 15.0 g/dL   HCT 22.4 (*)  36.0 - 46.0 %   MCV 98.2  78.0 - 100.0 fL   MCH 30.7  26.0 - 34.0 pg   MCHC 31.3  30.0 - 36.0 g/dL   RDW 20.2 (*) 11.5 - 15.5 %   Platelets    150 - 400 K/uL   Value: PLATELET CLUMPS NOTED ON SMEAR, COUNT APPEARS ADEQUATE   Neutrophils Relative % 73  43 - 77 %   Lymphocytes Relative 11 (*) 12 - 46 %   Monocytes Relative 14 (*) 3 - 12 %   Eosinophils Relative 1  0 - 5 %   Basophils Relative 1  0 - 1 %   Neutro Abs 8.3 (*) 1.7 - 7.7 K/uL   Lymphs Abs 1.3  0.7 - 4.0 K/uL   Monocytes Absolute 1.6 (*) 0.1 - 1.0 K/uL   Eosinophils Absolute 0.1  0.0 - 0.7 K/uL   Basophils Absolute 0.1  0.0 - 0.1 K/uL   RBC Morphology RARE NRBCs     WBC Morphology VACUOLATED NEUTROPHILS     Comment: INCREASED BANDS (>20% BANDS)     MILD LEFT SHIFT (1-5% METAS, OCC MYELO, OCC BANDS)     ATYPICAL LYMPHOCYTES     ATYPICAL MONONUCLEAR  CELLS   Smear Review LARGE PLATELETS PRESENT    COMPREHENSIVE METABOLIC PANEL     Status: Abnormal   Collection Time    11/15/2013  5:40 PM      Result Value Ref Range   Sodium 138  137 - 147 mEq/L   Potassium 3.9  3.7 - 5.3 mEq/L   Chloride 99  96 - 112 mEq/L   CO2 22  19 - 32 mEq/L   Glucose, Bld 152 (*) 70 - 99 mg/dL   BUN 14  6 - 23 mg/dL   Creatinine, Ser 1.08  0.50 - 1.10 mg/dL   Calcium 9.0  8.4 - 10.5 mg/dL   Total Protein 7.9  6.0 - 8.3 g/dL   Albumin 3.0 (*) 3.5 - 5.2 g/dL   AST 16  0 - 37 U/L   ALT 16  0 - 35 U/L   Alkaline Phosphatase 67  39 - 117 U/L   Total Bilirubin 0.8  0.3 - 1.2 mg/dL   GFR calc non Af Amer 55 (*) >90 mL/min   GFR calc Af Amer 64 (*) >90 mL/min   Comment: (NOTE)     The eGFR has been calculated using the CKD EPI equation.     This calculation has not been validated in all clinical situations.     eGFR's persistently <90 mL/min signify possible Chronic Kidney     Disease.  LIPASE, BLOOD     Status: None   Collection Time    11/11/2013  5:40 PM      Result Value Ref Range   Lipase 20  11 - 59 U/L  D-DIMER, QUANTITATIVE     Status: Abnormal   Collection Time    11/04/2013  5:40 PM      Result Value Ref Range   D-Dimer, Quant 2.41 (*) 0.00 - 0.48 ug/mL-FEU   Comment:            AT THE INHOUSE ESTABLISHED CUTOFF     VALUE OF 0.48 ug/mL FEU,     THIS ASSAY HAS BEEN DOCUMENTED     IN THE LITERATURE TO HAVE     A SENSITIVITY AND NEGATIVE     PREDICTIVE VALUE OF AT LEAST     98 TO 99%.  THE TEST RESULT     SHOULD BE CORRELATED WITH     AN ASSESSMENT OF THE CLINICAL     PROBABILITY OF DVT / VTE.  TROPONIN I     Status: None   Collection Time    11/01/2013  5:40 PM      Result Value Ref Range   Troponin I <0.30  <0.30 ng/mL   Comment:            Due to the release kinetics of cTnI,     a negative result within the first hours     of the onset of symptoms does not rule out     myocardial infarction with certainty.     If myocardial infarction is  still suspected,     repeat the test at appropriate intervals.  OCCULT BLOOD, POC DEVICE     Status: None   Collection Time    11/09/2013  6:00 PM      Result Value Ref Range   Fecal Occult Bld NEGATIVE  NEGATIVE  URINALYSIS, ROUTINE W REFLEX MICROSCOPIC     Status: Abnormal   Collection Time    11/16/2013  6:36 PM      Result Value Ref Range   Color, Urine YELLOW  YELLOW   APPearance CLOUDY (*) CLEAR   Specific Gravity, Urine 1.020  1.005 - 1.030   pH 6.0  5.0 - 8.0   Glucose, UA NEGATIVE  NEGATIVE mg/dL   Hgb urine dipstick TRACE (*) NEGATIVE   Bilirubin Urine NEGATIVE  NEGATIVE   Ketones, ur NEGATIVE  NEGATIVE mg/dL   Protein, ur 100 (*) NEGATIVE mg/dL   Urobilinogen, UA 0.2  0.0 - 1.0 mg/dL   Nitrite NEGATIVE  NEGATIVE   Leukocytes, UA TRACE (*) NEGATIVE  URINE MICROSCOPIC-ADD ON     Status: Abnormal   Collection Time    11/02/2013  6:36 PM      Result Value Ref Range   Squamous Epithelial / LPF RARE  RARE   WBC, UA 21-50  <3 WBC/hpf   Comment: WITH CLUMPS   RBC / HPF 0-2  <3 RBC/hpf   Bacteria, UA FEW (*) RARE   Casts HYALINE CASTS (*) NEGATIVE   Comment: GRANULAR CAST  TROPONIN I     Status: None   Collection Time    11/09/2013  9:50 PM      Result Value Ref Range   Troponin I <0.30  <0.30 ng/mL   Comment:            Due to the release kinetics of cTnI,     a negative result within the first hours     of the onset of symptoms does not rule out     myocardial infarction with certainty.     If myocardial infarction is still suspected,     repeat the test at appropriate intervals.  TYPE AND SCREEN     Status: None   Collection Time    10/29/2013 10:45 PM      Result Value Ref Range   ABO/RH(D) O POS     Antibody Screen NEG     Sample Expiration 11/10/2013     Unit Number [id number]     Blood Component Type RBC, LR IRR     Unit division 00     Status of Unit ISSUED     Transfusion Status OK TO TRANSFUSE     Crossmatch Result Compatible    CORTISOL     Status:  None   Collection Time    11/12/2013 10:45 PM      Result Value Ref Range   Cortisol, Plasma 19.6     Comment: (NOTE)     AM:  4.3 - 22.4 ug/dL     PM:  3.1 - 16.7 ug/dL     Performed at [facility]  DIC (DISSEMINATED INTRAVASCULAR COAGULATION) PANEL     Status: Abnormal   Collection Time    10/29/2013 10:45 PM      Result Value Ref Range   Prothrombin Time 15.5 (*) 11.6 - 15.2 seconds   INR 1.26  0.00 - 1.49   aPTT 34  24 - 37 seconds   Fibrinogen 621 (*) 204 - 475 mg/dL   D-Dimer, Quant 2.42 (*) 0.00 - 0.48 ug/mL-FEU   Comment:            AT THE INHOUSE ESTABLISHED CUTOFF     VALUE OF 0.48 ug/mL FEU,     THIS ASSAY HAS BEEN DOCUMENTED     IN THE LITERATURE TO HAVE     A SENSITIVITY AND NEGATIVE     PREDICTIVE VALUE OF AT LEAST     98 TO 99%.  THE TEST RESULT     SHOULD BE CORRELATED WITH     AN ASSESSMENT OF THE CLINICAL     PROBABILITY OF DVT / VTE.   Platelets    150 - 400 K/uL   Value: PLATELET CLUMPS NOTED ON SMEAR, COUNT APPEARS ADEQUATE   Comment: REPEATED TO VERIFY     LARGE PLATELETS PRESENT     PLATELET COUNT CONFIRMED BY SMEAR   Smear Review NO SCHISTOCYTES SEEN    PREPARE RBC (CROSSMATCH)     Status: None   Collection Time    11/12/2013 11:00 PM      Result Value Ref Range   Order Confirmation ORDER PROCESSED BY BLOOD BANK     Dg Chest 2 View  11/02/2013   CLINICAL DATA:  Cough, fever and shortness of breath.  EXAM: CHEST  2 VIEW  COMPARISON:  PA and lateral chest 01/04/2013 and 11/27/2011.  FINDINGS: Right suprahilar fullness is unchanged. Lungs appear clear. Elevation of the right hemidiaphragm is again seen. There is no pneumothorax or pleural effusion. Heart size is normal.  IMPRESSION: No acute disease.  Stable compared to prior exam.   Electronically Signed   By: Inge Rise M.D.   On: 10/30/2013 18:13   Ct Angio Chest Pe W/cm &/or Wo Cm  10/29/2013   CLINICAL DATA:  New diagnosis of leukemia. History of non-Hodgkin's lymphoma. Shortness of  breath and elevated D-dimer.  EXAM: CT ANGIOGRAPHY CHEST WITH CONTRAST  TECHNIQUE: Multidetector CT imaging of the chest was performed using the standard protocol during bolus administration of intravenous contrast. Multiplanar CT image reconstructions and MIPs were obtained to evaluate the vascular anatomy.  CONTRAST:  100 mL OMNIPAQUE IOHEXOL 350 MG/ML SOLN  COMPARISON:  PET CT scan 11/12/2008 and CT chest 01/25/2008.  FINDINGS: Although this study is somewhat degraded by respiratory motion, no pulmonary embolus is identified. Heart size is mildly enlarged. No pleural or pericardial effusion is identified. No axillary, hilar or mediastinal lymphadenopathy is seen. Lungs demonstrate a small focus of airspace opacity in the anterior aspect of the right upper lobe which is unchanged compared to the prior PET CT scan and is most consistent with scarring. The lungs are otherwise clear. Incidentally imaged upper abdomen shows unchanged low attenuating lesions in the liver consistent  with cysts. No lytic or sclerotic bony lesion is identified.  Review of the MIP images confirms the above findings.  IMPRESSION: Negative for pulmonary embolus.  No acute finding.  Cardiomegaly.   Electronically Signed   By: Inge Rise M.D.   On: 11/04/2013 19:56   Ct Abdomen Pelvis W Contrast  10/31/2013   CLINICAL DATA:  Intermittent nausea, vomiting and diarrhea. Right lower quadrant pain for 13 days.  EXAM: CT ABDOMEN AND PELVIS WITH CONTRAST  TECHNIQUE: Multidetector CT imaging of the abdomen and pelvis was performed using the standard protocol following bolus administration of intravenous contrast.  CONTRAST:  7mL OMNIPAQUE IOHEXOL 300 MG/ML  SOLN  COMPARISON:  CT of the chest 04/21/2007  FINDINGS: There is elevation of the right hemidiaphragm. And the lung bases are unremarkable in appearance.  Numerous low-attenuation lesions are identified throughout the liver, most consistent with cysts. No focal abnormality identified  within the spleen, pancreas, left adrenal gland, or left kidney.  There is new stranding in the region of the right adrenal gland which now appears somewhat ill defined and diffusely low-attenuation without focal lesion. Differential diagnosis includes infectious/inflammatory process or early hemorrhage. There is some associated stranding surrounding the upper pole of the right kidney but otherwise, the right kidney has a normal appearance. There is normal bilateral renal excretion. The gallbladder is present.  The stomach and small bowel loops are normal in wall thickness and caliber. The appendix is well seen and has a normal appearance. There scattered colonic diverticula otherwise, the colon has a normal appearance.  Small aortocaval lymph node is 9 mm. Small left femoral lymph node is 14 mm.  There are mild degenerative changes in the spine. No suspicious lytic or blastic lesions are identified.  IMPRESSION: 1. Significant change in the appearance of the right adrenal, with diffuse low-attenuation and significant stranding in the retroperitoneum around the adrenal gland. Considerations include infectious or inflammatory process or early hemorrhage. 2. The left adrenal gland has a normal appearance. 3. Normal appendix. 4. Numerous liver cysts, stable in appearance.   Electronically Signed   By: Shon Hale M.D.   On: 11/23/2013 21:50   Ct Maxillofacial Wo Cm  11/08/2013   CLINICAL DATA:  Headache.  Dizziness.  Swelling.  EXAM: CT HEAD WITHOUT CONTRAST  CT MAXILLOFACIAL WITHOUT CONTRAST  TECHNIQUE: Multidetector CT imaging of the head and maxillofacial structures were performed using the standard protocol without intravenous contrast. Multiplanar CT image reconstructions of the maxillofacial structures were also generated.  COMPARISON:  NM PET IMAGE RESTAG (PS) SKULL BASE TO THIGH dated 11/12/2008; NM PET IMAGE SKULL BASE TO THIGH dated 07/31/2008; NM PET IMAGE SKULL BASE TO THIGH dated 05/02/2008; MR-FACE dated  03/08/2008  FINDINGS: CT HEAD FINDINGS  This study is not truly a noncontrast head CT as contrast was administered in conjunction with abdominal CT earlier today. There is high attenuation the intravascular compartment. Enlargement of the pineal gland is present, measuring 1 cm. Tiny calcifications are present as well. This could be further evaluated on MRI. Mucosal thickening is present in the paranasal sinuses. No fluid levels. The mastoid air cells are clear. Globes appear intact. No enhancing intracranial lesions.  CT MAXILLOFACIAL FINDINGS  Negative for facial fracture. No destructive osseous lesions. Artifact is present from dental hardware. The mandibular condyles are located. Pterygoid plates intact diffuse mucosal thickening is present with sparing of the frontal sinuses. Intracranial contents appear within normal limits aside from pineal mass measuring 1 cm with calcifications. Globes appear  normal. Bilateral temporomandibular joint osteoarthritis is present. Partial visualization of the cervical spine is within normal limits.  IMPRESSION: 1. No acute and intracranial abnormality. 2. Facial CT within normal limits. Mild to moderate mucosal paranasal sinus disease. 3. 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 Followup MRI brain with and without contrast could be considered for further assessment. There is no prior imaging that covers this area.   Electronically Signed   By: Dereck Ligas M.D.   On: 11/08/2013 00:49    Assessment/Plan Hx Lymphoma Progressive thrombocytopenia and anemia For CT guided BM biopsy today Explained procedure risks, complications, use of sedation. Consent signed in chart  Port will have to wait a few days until UTI is appropriately treated. Will also have to follow PLT counts and she may need PLTs prior to port placement.   Ascencion Dike PA-C 11/08/2013, 8:58 AM

## 2013-11-08 NOTE — Progress Notes (Addendum)
[facility] PROGRESS NOTE  Lacey [name] PNT:[id number] DOB: 06/24/1956 DOA: 11/12/2013 PCP: Vena Austria, MD  Assessment/Plan:  Pyelonephritis  -Continue aggressive hydration -CT scan of the abdomen is suggestive of possible renal stranding in the right upper pole as well as inflammation versus hemorrhage in adrenal on right side -Continue IV Cipro and IV aztreonam for dual Pseudomonas coverage. -Blood cultures, urine cultures, sputum cultures and urine antigens. Pending   Symptomatically anemia,  -Secondary melena and myelodysplastic syndrome  -Hx melena one week ago resolved; negative Hemoccult. Her hemoglobin has trended downwards (7.5)   SOB  -symptomatic myelodysplastic syndrome (chest tightness and dyspnea); S/P transfused one unit PRBC  -Hemoccult x3.  -May require GI consult.   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 Etiology unclear however if infectious should be covered by broad-spectrum antibiotic   Bronchitis  -Continue antibiotics, Tessalon Perles Mucinex.  Headache blurred vision and nasal bridge swelling.  -Most likely sinusitis, will be treated by her present antibiotic coverage        Consultants:    Procedures:    Antibiotics: Aztreonam 2/11>> Ciprofloxacin 2/11>> Vancomycin 2/11>>     Code Status: Full Family Communication: Family at bedside for discussion of plan of care Disposition Plan: Resolution pyelonephritis      HPI/Subjective: Lacey [name] is a 58 y.o. female with Past medical history of Hodgkin's lymphoma status post chemotherapy in the pas has myelodysplastic syndrome.  The patient is coming from home.  Patient presents with complaints of nausea vomiting and right-sided abdominal pain.  The patient mentions that her symptoms started a month ago with cough and yellowish expectoration for which she completed a course of amoxicillin  despite which her cough was not getting better.since last one month she started having dyspnea on exertion which is progressively worsening.denies any orthopnea or PND.  Since last one week she started having complaints of chest tightness on exertion along with dyspnea which resolved on rest. She is at present chest pain-free.  She was on a cruise to [place] last week and off for going on cruise she started having episodes of black color bowel movements which are present on last Tuesday without any abdominal pain or nausea and resolved. She continues to have diarrhea then on and off but no melena or active bleeding. She got some medication and cruise.  Since Saturday started having complaints of pain in her right lower abdomen which was sharp and was associated with nausea and was to 6 episodes of vomiting today. She has poor appetite and has not had any today.  She's taking her medications at home and there is no change in her medication and last 1 month.  She denies any burning urination but she did have a fever of 102.35F yesterday.  She did have some bleeding from her nose on and off since last one month associated with redness and swelling of her nasal bridge with some blurring of vision which is present since last one week. She denies any focal neurological deficit she has chronic orthostatic dizziness which has not changed.      Objective: Filed Vitals:   11/08/13 0730 11/08/13 0910 11/08/13 1343 11/08/13 1500  BP: 109/57  105/49   Pulse: 100  103   Temp: 99.1 F (37.3 C)  98.9 F (37.2 C)   TempSrc: Oral  Oral   Resp: 20  18   Height:      Weight:  SpO2: 94% 95% 97% 95%    Intake/Output Summary (Last 24 hours) at 11/08/13 1510 Last data filed at 11/08/13 1300  Gross per 24 hour  Intake   1985 ml  Output      0 ml  Net   1985 ml   Filed Weights   11/03/2013 1722 11/08/13 0520  Weight: 83.462 kg (184 lb) 84.3 kg (185 lb 13.6 oz)    Exam:   General: A./O. x4,  NAD  Cardiovascular: Tachycardic, regular rhythm, negative murmurs rubs gallop  Respiratory: Diffuse wheezing  Abdomen: Negative CVA/abdominal tenderness  Musculoskeletal: Negative pedal edema   Data Reviewed: Basic Metabolic Panel:  Recent Labs Lab 11/14/2013 1740 11/08/13 0900  NA 138 135*  K 3.9 4.3  CL 99 101  CO2 22 23  GLUCOSE 152* 126*  BUN 14 14  CREATININE 1.08 1.25*  CALCIUM 9.0 8.4   Liver Function Tests:  Recent Labs Lab 11/04/2013 1740 11/08/13 0900  AST 16 14  ALT 16 14  ALKPHOS 67 62  BILITOT 0.8 1.0  PROT 7.9 7.1  ALBUMIN 3.0* 2.8*    Recent Labs Lab 11/11/2013 1740  LIPASE 20   No results found for this basename: AMMONIA,  in the last 168 hours CBC:  Recent Labs Lab 11/16/2013 1740 10/30/2013 2245 11/08/13 0900  WBC 11.4*  --  12.7*  NEUTROABS 8.3*  --   --   HGB 7.0*  --  7.5*  HCT 22.4*  --  23.5*  MCV 98.2  --  94.8  PLT PLATELET CLUMPS NOTED ON SMEAR, COUNT APPEARS ADEQUATE PLATELET CLUMPS NOTED ON SMEAR, COUNT APPEARS ADEQUATE 112*   Cardiac Enzymes:  Recent Labs Lab 11/01/2013 1740 11/01/2013 2150  TROPONINI <0.30 <0.30   BNP (last 3 results) No results found for this basename: PROBNP,  in the last 8760 hours CBG: No results found for this basename: GLUCAP,  in the last 168 hours  No results found for this or any previous visit (from the past 240 hour(s)).   Studies: Dg Chest 2 View  11/06/2013   CLINICAL DATA:  Cough, fever and shortness of breath.  EXAM: CHEST  2 VIEW  COMPARISON:  PA and lateral chest 01/04/2013 and 11/27/2011.  FINDINGS: Right suprahilar fullness is unchanged. Lungs appear clear. Elevation of the right hemidiaphragm is again seen. There is no pneumothorax or pleural effusion. Heart size is normal.  IMPRESSION: No acute disease.  Stable compared to prior exam.   Electronically Signed   By: Inge Rise M.D.   On: 11/08/2013 18:13   Ct Angio Chest Pe W/cm &/or Wo Cm  11/03/2013   CLINICAL DATA:  New  diagnosis of leukemia. History of non-Hodgkin's lymphoma. Shortness of breath and elevated D-dimer.  EXAM: CT ANGIOGRAPHY CHEST WITH CONTRAST  TECHNIQUE: Multidetector CT imaging of the chest was performed using the standard protocol during bolus administration of intravenous contrast. Multiplanar CT image reconstructions and MIPs were obtained to evaluate the vascular anatomy.  CONTRAST:  100 mL OMNIPAQUE IOHEXOL 350 MG/ML SOLN  COMPARISON:  PET CT scan 11/12/2008 and CT chest 01/25/2008.  FINDINGS: Although this study is somewhat degraded by respiratory motion, no pulmonary embolus is identified. Heart size is mildly enlarged. No pleural or pericardial effusion is identified. No axillary, hilar or mediastinal lymphadenopathy is seen. Lungs demonstrate a small focus of airspace opacity in the anterior aspect of the right upper lobe which is unchanged compared to the prior PET CT scan and is most consistent with scarring. The  lungs are otherwise clear. Incidentally imaged upper abdomen shows unchanged low attenuating lesions in the liver consistent with cysts. No lytic or sclerotic bony lesion is identified.  Review of the MIP images confirms the above findings.  IMPRESSION: Negative for pulmonary embolus.  No acute finding.  Cardiomegaly.   Electronically Signed   By: Inge Rise M.D.   On: 11/10/2013 19:56   Ct Abdomen Pelvis W Contrast  11/13/2013   CLINICAL DATA:  Intermittent nausea, vomiting and diarrhea. Right lower quadrant pain for 13 days.  EXAM: CT ABDOMEN AND PELVIS WITH CONTRAST  TECHNIQUE: Multidetector CT imaging of the abdomen and pelvis was performed using the standard protocol following bolus administration of intravenous contrast.  CONTRAST:  59mL OMNIPAQUE IOHEXOL 300 MG/ML  SOLN  COMPARISON:  CT of the chest 04/21/2007  FINDINGS: There is elevation of the right hemidiaphragm. And the lung bases are unremarkable in appearance.  Numerous low-attenuation lesions are identified throughout  the liver, most consistent with cysts. No focal abnormality identified within the spleen, pancreas, left adrenal gland, or left kidney.  There is new stranding in the region of the right adrenal gland which now appears somewhat ill defined and diffusely low-attenuation without focal lesion. Differential diagnosis includes infectious/inflammatory process or early hemorrhage. There is some associated stranding surrounding the upper pole of the right kidney but otherwise, the right kidney has a normal appearance. There is normal bilateral renal excretion. The gallbladder is present.  The stomach and small bowel loops are normal in wall thickness and caliber. The appendix is well seen and has a normal appearance. There scattered colonic diverticula otherwise, the colon has a normal appearance.  Small aortocaval lymph node is 9 mm. Small left femoral lymph node is 14 mm.  There are mild degenerative changes in the spine. No suspicious lytic or blastic lesions are identified.  IMPRESSION: 1. Significant change in the appearance of the right adrenal, with diffuse low-attenuation and significant stranding in the retroperitoneum around the adrenal gland. Considerations include infectious or inflammatory process or early hemorrhage. 2. The left adrenal gland has a normal appearance. 3. Normal appendix. 4. Numerous liver cysts, stable in appearance.   Electronically Signed   By: Shon Hale M.D.   On: 11/21/2013 21:50   Ct Maxillofacial Wo Cm  11/08/2013   CLINICAL DATA:  Headache.  Dizziness.  Swelling.  EXAM: CT HEAD WITHOUT CONTRAST  CT MAXILLOFACIAL WITHOUT CONTRAST  TECHNIQUE: Multidetector CT imaging of the head and maxillofacial structures were performed using the standard protocol without intravenous contrast. Multiplanar CT image reconstructions of the maxillofacial structures were also generated.  COMPARISON:  NM PET IMAGE RESTAG (PS) SKULL BASE TO THIGH dated 11/12/2008; NM PET IMAGE SKULL BASE TO THIGH dated  07/31/2008; NM PET IMAGE SKULL BASE TO THIGH dated 05/02/2008; MR-FACE dated 03/08/2008  FINDINGS: CT HEAD FINDINGS  This study is not truly a noncontrast head CT as contrast was administered in conjunction with abdominal CT earlier today. There is high attenuation the intravascular compartment. Enlargement of the pineal gland is present, measuring 1 cm. Tiny calcifications are present as well. This could be further evaluated on MRI. Mucosal thickening is present in the paranasal sinuses. No fluid levels. The mastoid air cells are clear. Globes appear intact. No enhancing intracranial lesions.  CT MAXILLOFACIAL FINDINGS  Negative for facial fracture. No destructive osseous lesions. Artifact is present from dental hardware. The mandibular condyles are located. Pterygoid plates intact diffuse mucosal thickening is present with sparing of the frontal sinuses.  Intracranial contents appear within normal limits aside from pineal mass measuring 1 cm with calcifications. Globes appear normal. Bilateral temporomandibular joint osteoarthritis is present. Partial visualization of the cervical spine is within normal limits.  IMPRESSION: 1. No acute and intracranial abnormality. 2. Facial CT within normal limits. Mild to moderate mucosal paranasal sinus disease. 3. 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 Followup MRI brain with and without contrast could be considered for further assessment. There is no prior imaging that covers this area.   Electronically Signed   By: Dereck Ligas M.D.   On: 11/08/2013 00:49    Scheduled Meds: . aztreonam  1 g Intravenous 3 times per day  . ciprofloxacin  400 mg Intravenous Q12H  . DULoxetine  60 mg Oral QPM  . gabapentin  600 mg Oral BID  . hydrocortisone sod succinate (SOLU-CORTEF) inj  100 mg Intravenous 3 times per day  . ipratropium-albuterol  3 mL Nebulization Q6H  . sodium chloride  3 mL Intravenous Q12H  . vancomycin  750 mg Intravenous Q12H   Continuous  Infusions: . sodium chloride Stopped (11/05/2013 2326)  . sodium chloride 100 mL/hr at 11/08/13 [id number]    Principal Problem:   Pyelonephritis Active Problems:   Hodgkin lymphoma   Myelodysplasia   Adrenal abnormality   Rash and nonspecific skin eruption   Nasal swelling   Acute bronchitis   Chest tightness   DOE (dyspnea on exertion)   Symptomatic anemia   Melena    Time spent: 40 minute   WOODS, [place], J  [facility] Pager [phone]. If 7PM-7AM, please contact night-coverage at [email or web address], password [facility] 11/08/2013, 3:10 PM  LOS: 1 day

## 2013-11-08 NOTE — Progress Notes (Signed)
Addendum:  Unable to accommodate BM biopsy today due to existing procedures schedule. Have allowed pt to resume diet today and have made NPO p MN Pt for BM bx at 0800 tomorrow 2/12.  Ascencion Dike PA-C Interventional Radiology 11/08/2013 11:20 AM

## 2013-11-08 NOTE — Progress Notes (Signed)
ANTIBIOTIC CONSULT NOTE - INITIAL  Pharmacy Consult for Vancomycin/aztreonam Indication: UTI/bacteremia   Allergies  Allergen Reactions  . Penicillins Hives and Rash    Patient Measurements: Height: 5' (152.4 cm) Weight: 184 lb (83.462 kg) IBW/kg (Calculated) : 45.5 Adjusted Body Weight:   Vital Signs: Temp: 99.2 F (37.3 C) (02/10 2352) Temp src: Oral (02/10 2352) BP: 113/66 mmHg (02/10 2352) Pulse Rate: 104 (02/10 2352) Intake/Output from previous day: 02/10 0701 - 02/11 0700 In: 1000 [I.V.:1000] Out: -  Intake/Output from this shift: Total I/O In: 1000 [I.V.:1000] Out: -   Labs:  Recent Labs  2013-11-16 1740 16-Nov-2013 2245  WBC 11.4*  --   HGB 7.0*  --   PLT PLATELET CLUMPS NOTED ON SMEAR, COUNT APPEARS ADEQUATE PLATELET CLUMPS NOTED ON SMEAR, COUNT APPEARS ADEQUATE  CREATININE 1.08  --    Estimated Creatinine Clearance: 53.7 ml/min (by C-G formula based on Cr of 1.08). No results found for this basename: VANCOTROUGH, Corlis Leak, VANCORANDOM, GENTTROUGH, GENTPEAK, GENTRANDOM, TOBRATROUGH, TOBRAPEAK, TOBRARND, AMIKACINPEAK, AMIKACINTROU, AMIKACIN,  in the last 72 hours   Microbiology: Recent Results (from the past 720 hour(s))  TECHNOLOGIST REVIEW     Status: None   Collection Time    10/13/13  8:36 AM      Result Value Ref Range Status   Technologist Review 2% Blasts, 2% metamyelocytes, 1% myelocytes   Final    Medical History: Past Medical History  Diagnosis Date  . Cancer   . Cough productive of clear sputum 01/04/2013  . Fibromyalgia     Medications:  Anti-infectives   Start     Dose/Rate Route Frequency Ordered Stop   11/08/13 1800  vancomycin (VANCOCIN) IVPB 750 mg/150 ml premix     750 mg 150 mL/hr over 60 Minutes Intravenous Every 12 hours 11/08/13 0307     11/08/13 1000  ciprofloxacin (CIPRO) IVPB 400 mg     400 mg 200 mL/hr over 60 Minutes Intravenous Every 12 hours November 16, 2013 2325     11/08/13 0600  aztreonam (AZACTAM) 1 g in dextrose 5 % 50  mL IVPB     1 g 100 mL/hr over 30 Minutes Intravenous 3 times per day 11/08/13 0307     11/08/13 0030  vancomycin (VANCOCIN) IVPB 1000 mg/200 mL premix     1,000 mg 200 mL/hr over 60 Minutes Intravenous  Once 11/08/13 0020     11/08/13 0000  aztreonam (AZACTAM) 2 g in dextrose 5 % 50 mL IVPB     2 g 100 mL/hr over 30 Minutes Intravenous  Once Nov 16, 2013 2357 11/08/13 0151   Nov 16, 2013 2130  ciprofloxacin (CIPRO) IVPB 400 mg     400 mg 200 mL/hr over 60 Minutes Intravenous  Once 2013-11-16 2111 Nov 16, 2013 2325     Assessment: Patient with bacteremia/UTI.    Goal of Therapy:  Vancomycin trough level 15-20 mcg/ml Aztreonam dosed based on patient weight and renal function   Plan:  Measure antibiotic drug levels at steady state Follow up culture results Vancomycin 750mg  iv q8hr (after 1gm load), aztreonam 1gm iv q8hr  Tyler Deis, Shea Stakes Crowford 11/08/2013,3:10 AM

## 2013-11-08 NOTE — Consult Note (Signed)
#   [id number] is consult note.  Lacey [name] [birth year]

## 2013-11-08 NOTE — Consult Note (Signed)
Agree 

## 2013-11-09 ENCOUNTER — Other Ambulatory Visit: Payer: BC Managed Care – PPO | Admitting: Lab

## 2013-11-09 ENCOUNTER — Inpatient Hospital Stay (HOSPITAL_COMMUNITY): Payer: BC Managed Care – PPO

## 2013-11-09 ENCOUNTER — Ambulatory Visit: Payer: BC Managed Care – PPO | Admitting: Hematology & Oncology

## 2013-11-09 DIAGNOSIS — N289 Disorder of kidney and ureter, unspecified: Secondary | ICD-10-CM

## 2013-11-09 LAB — CBC WITH DIFFERENTIAL/PLATELET
BAND NEUTROPHILS: 5 % (ref 0–10)
BLASTS: 0 %
Basophils Absolute: 0 10*3/uL (ref 0.0–0.1)
Basophils Relative: 0 % (ref 0–1)
Eosinophils Absolute: 0 10*3/uL (ref 0.0–0.7)
Eosinophils Relative: 0 % (ref 0–5)
HCT: 23 % — ABNORMAL LOW (ref 36.0–46.0)
Hemoglobin: 7.2 g/dL — ABNORMAL LOW (ref 12.0–15.0)
Lymphocytes Relative: 16 % (ref 12–46)
Lymphs Abs: 2.5 10*3/uL (ref 0.7–4.0)
MCH: 30 pg (ref 26.0–34.0)
MCHC: 31.3 g/dL (ref 30.0–36.0)
MCV: 95.8 fL (ref 78.0–100.0)
METAMYELOCYTES PCT: 6 %
MONO ABS: 1.7 10*3/uL — AB (ref 0.1–1.0)
MONOS PCT: 11 % (ref 3–12)
Myelocytes: 10 %
NRBC: 2 /100{WBCs} — AB
Neutro Abs: 11.7 10*3/uL — ABNORMAL HIGH (ref 1.7–7.7)
Neutrophils Relative %: 52 % (ref 43–77)
PLATELETS: 92 10*3/uL — AB (ref 150–400)
Promyelocytes Absolute: 0 %
RBC: 2.4 MIL/uL — ABNORMAL LOW (ref 3.87–5.11)
RDW: 22.8 % — AB (ref 11.5–15.5)
WBC: 15.9 10*3/uL — AB (ref 4.0–10.5)

## 2013-11-09 LAB — BONE MARROW EXAM

## 2013-11-09 LAB — COMPREHENSIVE METABOLIC PANEL
ALBUMIN: 2.6 g/dL — AB (ref 3.5–5.2)
ALK PHOS: 65 U/L (ref 39–117)
ALT: 13 U/L (ref 0–35)
AST: 11 U/L (ref 0–37)
BUN: 17 mg/dL (ref 6–23)
CHLORIDE: 106 meq/L (ref 96–112)
CO2: 22 mEq/L (ref 19–32)
Calcium: 8.6 mg/dL (ref 8.4–10.5)
Creatinine, Ser: 1.28 mg/dL — ABNORMAL HIGH (ref 0.50–1.10)
GFR calc Af Amer: 52 mL/min — ABNORMAL LOW (ref >90)
GFR calc non Af Amer: 45 mL/min — ABNORMAL LOW (ref >90)
Glucose, Bld: 202 mg/dL — ABNORMAL HIGH (ref 70–99)
POTASSIUM: 4.1 meq/L (ref 3.7–5.3)
SODIUM: 140 meq/L (ref 137–147)
Total Bilirubin: 0.5 mg/dL (ref 0.3–1.2)
Total Protein: 7 g/dL (ref 6.0–8.3)

## 2013-11-09 LAB — RETICULOCYTES
RBC.: 2.41 MIL/uL — AB (ref 3.87–5.11)
RETIC COUNT ABSOLUTE: 14.5 10*3/uL — AB (ref 19.0–186.0)
Retic Ct Pct: 0.6 % (ref 0.4–3.1)

## 2013-11-09 LAB — MAGNESIUM: MAGNESIUM: 2.2 mg/dL (ref 1.5–2.5)

## 2013-11-09 LAB — LACTIC ACID, PLASMA: Lactic Acid, Venous: 1.5 mmol/L (ref 0.5–2.2)

## 2013-11-09 MED ORDER — FENTANYL CITRATE 0.05 MG/ML IJ SOLN
INTRAMUSCULAR | Status: AC
  Filled 2013-11-09: qty 6

## 2013-11-09 MED ORDER — DARBEPOETIN ALFA-POLYSORBATE 300 MCG/0.6ML IJ SOLN
300.0000 ug | Freq: Once | INTRAMUSCULAR | Status: AC
  Administered 2013-11-09: 300 ug via SUBCUTANEOUS
  Filled 2013-11-09: qty 0.6

## 2013-11-09 MED ORDER — HYDROCODONE-ACETAMINOPHEN 5-325 MG PO TABS
1.0000 | ORAL_TABLET | ORAL | Status: DC | PRN
  Administered 2013-11-12: 1 via ORAL
  Administered 2013-11-13: 2 via ORAL
  Administered 2013-11-13: 1 via ORAL
  Administered 2013-11-20 (×2): 2 via ORAL
  Administered 2013-11-20: 1 via ORAL
  Administered 2013-11-21 (×2): 2 via ORAL
  Filled 2013-11-09: qty 2
  Filled 2013-11-09 (×2): qty 1
  Filled 2013-11-09 (×3): qty 2
  Filled 2013-11-09: qty 1
  Filled 2013-11-09: qty 2

## 2013-11-09 MED ORDER — MIDAZOLAM HCL 2 MG/2ML IJ SOLN
INTRAMUSCULAR | Status: AC | PRN
  Administered 2013-11-09 (×2): 1 mg via INTRAVENOUS

## 2013-11-09 MED ORDER — FENTANYL CITRATE 0.05 MG/ML IJ SOLN
INTRAMUSCULAR | Status: AC | PRN
  Administered 2013-11-09: 50 ug via INTRAVENOUS

## 2013-11-09 MED ORDER — MIDAZOLAM HCL 2 MG/2ML IJ SOLN
INTRAMUSCULAR | Status: AC
  Filled 2013-11-09: qty 6

## 2013-11-09 NOTE — Consult Note (Signed)
Lacey [name], Lacey [name]            ACCOUNT NO.:  [id number]  MEDICAL RECORD NO.:  [id number]  LOCATION:  4627                         FACILITY:  [facility]  PHYSICIAN:  Volanda Napoleon, M.D.  DATE OF BIRTH:  1956-01-29  DATE OF CONSULTATION:  11/08/2013 DATE OF DISCHARGE:                                CONSULTATION   REFERRING PHYSICIAN:  Pranav D. Posey Pronto, MD.  REASON FOR CONSULTATION: 1. Treatment-related myelodysplasia. 2. Pyelonephritis.  HISTORY OF PRESENT ILLNESS:  Ms. Lefebre is a very well-known to me. She is a 58 year old white female.  She has history of Hodgkin's disease.  She underwent a stem cell transplantation for this.  She had her stem cell transplant for her Hodgkin's disease probably about 5 years ago.  She had recurrent disease and underwent salvage of chemotherapy followed by transplant.  Unfortunately, she has now developed treatment-related myelodysplasia. This was confirmed on bone marrow biopsy done, I think in October of last year.  She has multiple chromosomal abnormalities.  She is in the process of trying to get an allogeneic transplant at [facility].  We have been following her supportively.  She has been asymptomatic. Unfortunately, she now is admitted with what appears to be pyelonephritis.  She came in with temperature.  When she was admitted, her CBC showed white blood cell count 11.4, hemoglobin 7, hematocrit 22.4, and platelets were "clumped."  She has gotten 2 units of packed red blood cells.  She is on IV antibiotics.  Her electrolytes looked okay.  BUN 14, creatinine 1.08.  We were asked to see her to try to manage her myelodysplastic issues.  Again, she has been doing fairly well up until recently.  She has been asymptomatic.  She has been working.  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  Again, everything was appointed toward this being  treatment-related myelodysplasia.  She does have some underlying lung disease, which is being followed.  She did have a CT of the face when she came in.  This was pretty much unremarkable.  I think CT of the abdomen, pelvis and chest when she came in showed some appearance of changes in the right adrenal gland.  This may reflect infectious or early hemorrhage.  She had liver that was unremarkable. CT angio of the chest was negative for pulmonary embolism.  PAST MEDICAL HISTORY:  Remarkable for: 1. Hodgkin's disease. 2. Chronic lung disease. 3. Fibromyalgia.  ALLERGIES:  Penicillin.  ADMISSION MEDICATIONS: 1. Vitamin D 1000 units daily. 2. Cymbalta 60 mg p.o. daily. 3. Neurontin 600 mg p.o. b.i.d.  SOCIAL HISTORY:  Negative for tobacco or alcohol use.  PHYSICAL EXAMINATION:  GENERAL:  This is a fairly well-developed and well-nourished white female, in no obvious distress. VITAL SIGNS:  Temperature of 99.1, pulse 99, respiratory rate 22, blood pressure 108/63. HEAD AND NECK:  Normocephalic, atraumatic skull.  She has no ocular or oral lesions.  She has little bit of an abrasion on the right submandibular region. NECK:  Supple.  No adenopathy. LUNGS:  Some scattered wheezes bilaterally.  She has no crackles. CARDIAC:  Slightly tachycardic, but  regular.  She has no murmurs, rubs or bruits. ABDOMEN:  Soft.  She is mildly obese.  She has no fluid wave.  There is no palpable hepatosplenomegaly. BACK:  No tenderness over the spine, ribs or hips. EXTREMITIES:  No clubbing, cyanosis, or edema. SKIN:  Some scattered ecchymoses.  IMPRESSION:  Ms. Wenk is a 58 year old, white female, with treatment- related myelodysplasia.  Again, I said that she is progressing and hopefully not transforming into acute myeloid leukemia.  While she is in the hospital, she will need to have a bone marrow biopsy done.  We will see about Radiology doing this for [place].  I agree with the  antibiotics.  I will check an erythropoietin level on her.  Possibly, we might be able to use ESA for her anemia.  Again, I think, the only way that we are really going to help her in the long run is for a bone marrow transplant to be done.  I am not sure when they will be able to find one.  I think, she is probably need to have a Port-A-Cath put in.  Hopefully, she can get this done while she is in the hospital.  I just want to get one in as we may have to treat the myelodysplasia while she is awaiting her allogeneic transplant.  I totally appreciate the Hospitalist help with Ms. Kishimoto.  Ms. Newsome is so nice.  She is very appreciable of all the great care that she has been getting.  We will follow her along very closely.     Volanda Napoleon, M.D.     PRE/MEDQ  D:  11/08/2013  T:  11/09/2013  Job:  [id number]

## 2013-11-09 NOTE — Procedures (Signed)
CT-guided  R iliac bone marrow aspiration and core biopsy No complication No blood loss. See complete dictation in [facility] PACS  

## 2013-11-09 NOTE — Progress Notes (Signed)
Inpatient Diabetes Program Recommendations  AACE/ADA: New Consensus Statement on Inpatient Glycemic Control (2013)  Target Ranges:  Prepandial:   less than 140 mg/dL      Peak postprandial:   less than 180 mg/dL (1-2 hours)      Critically ill patients:  140 - 180 mg/dL   Results for MINNETTE, MERIDA (MRN [id number]) as of 11/09/2013 09:25  Ref. Range 11/14/2013 17:40 11/08/2013 09:00 11/09/2013 04:35  Glucose Latest Range: 70-99 mg/dL 152 (H) 126 (H) 202 (H)   Diabetes history: No Outpatient Diabetes medications: NA Current orders for Inpatient glycemic control: None  Inpatient Diabetes Program Recommendations Correction (SSI): While inpatient and receiving steroids, please order CBGs with Novolog correction ACHS. HgbA1C: Please consider ordering an A1C to evaluate glycemic control over the past 2-3 months.  Thanks, Barnie Alderman, RN, MSN, CCRN Diabetes Coordinator Inpatient Diabetes Program [phone] (Team Pager) [phone] (AP office) [phone] [facility] office)

## 2013-11-09 NOTE — Progress Notes (Signed)
[facility] PROGRESS NOTE  Lacey [name] TZG:[id number] DOB: 15-Mar-1956 DOA: 11/17/2013 PCP: Vena Austria, MD  Assessment/Plan:  Pyelonephritis  -Continue aggressive hydration -CT scan of the abdomen is suggestive of possible renal stranding in the right upper pole as well as inflammation versus hemorrhage in adrenal on right side -Continue IV Cipro and IV aztreonam for dual Pseudomonas coverage. -Blood cultures negative,  -Urine culture not not   Symptomatically anemia,  -Secondary melena and myelodysplastic syndrome  -Hx melena one week ago resolved; negative Hemoccult. Her hemoglobin has trended downwards 2/12 hemoglobin= (7.2)  -Continue to monitor -Obtain ambulatory SpO2  SOB  -symptomatic myelodysplastic syndrome (chest tightness and dyspnea); S/P transfused one unit PRBC  -Hemoccult x3 pending.  -May require GI consult.   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 Etiology unclear however if infectious should be covered by broad-spectrum antibiotic   Bronchitis  -Continue antibiotics, Tessalon Perles Mucinex.  -Headache blurred vision and nasal bridge swelling, resolving, Most likely sinusitis, will be treated by her present antibiotic coverage -Continue steroids (Solu-Cortef) per oncology    Consultants: Dr. Burney Gauze (oncology)   Procedures: -2/12 Bone marrow biopsy report pending    Antibiotics: Aztreonam 2/11>> Ciprofloxacin 2/11>> Vancomycin 2/11>>     Code Status: Full Family Communication: Family at bedside for discussion of plan of care Disposition Plan: Resolution pyelonephritis      HPI/Subjective: Lacey [name] is a 58 y.o. WF PMHx Hodgkin's lymphoma status post chemotherapy in the pas has myelodysplastic syndrome. The patient is coming from home. Patient presents with complaints of nausea vomiting and right-sided abdominal pain. The patient mentions  that her symptoms started a month ago with cough and yellowish expectoration for which she completed a course of amoxicillin despite which her cough was not getting better.since last one month she started having dyspnea on exertion which is progressively worsening.denies any orthopnea or PND.  Since last one week she started having complaints of chest tightness on exertion along with dyspnea which resolved on rest. She is at present chest pain-free. She was on a cruise to [place] last week and off for going on cruise she started having episodes of black color bowel movements which are present on last Tuesday without any abdominal pain or nausea and resolved. She continues to have diarrhea then on and off but no melena or active bleeding. She got some medication and cruise.  Since Saturday started having complaints of pain in her right lower abdomen which was sharp and was associated with nausea and was to 6 episodes of vomiting today. She has poor appetite and has not had any today. She's taking her medications at home and there is no change in her medication and last 1 month. She denies any burning urination but she did have a fever of 102.30F yesterday.  She did have some bleeding from her nose on and off since last one month associated with redness and swelling of her nasal bridge with some blurring of vision which is present since last one week. She denies any focal neurological deficit she has chronic orthostatic dizziness which has not changed. 2/12 states decreased facial pain, decreased SOB, continued DOE.     Objective: Filed Vitals:   11/09/13 1052 11/09/13 1058 11/09/13 1300 11/09/13 1512  BP: 105/58  119/50   Pulse: 104  107   Temp: 97.7 F (36.5 C)  98.4 F (36.9 C)   TempSrc: Oral  Oral  Resp: 20  20   Height:      Weight:      SpO2:  94% 94% 93%    Intake/Output Summary (Last 24 hours) at 11/09/13 1538 Last data filed at 11/09/13 1444  Gross per 24 hour  Intake    3160 ml  Output      0 ml  Net   3160 ml   Filed Weights   10/29/2013 1722 11/08/13 0520 11/09/13 0616  Weight: 83.462 kg (184 lb) 84.3 kg (185 lb 13.6 oz) 85.4 kg (188 lb 4.4 oz)    Exam:   General: A./O. x4, NAD  Cardiovascular: Tachycardic, regular rhythm, negative murmurs rubs gallop  Respiratory: Diffuse wheezing, dry hacking cough  Abdomen: Negative CVA/abdominal tenderness  Musculoskeletal: Negative pedal edema   Data Reviewed: Basic Metabolic Panel:  Recent Labs Lab 11/22/2013 1740 11/08/13 0900 11/09/13 0435  NA 138 135* 140  K 3.9 4.3 4.1  CL 99 101 106  CO2 $Re'22 23 22  'TFp$ GLUCOSE 152* 126* 202*  BUN $Re'14 14 17  'NMT$ CREATININE 1.08 1.25* 1.28*  CALCIUM 9.0 8.4 8.6  MG  --   --  2.2   Liver Function Tests:  Recent Labs Lab 11/14/2013 1740 11/08/13 0900 11/09/13 0435  AST $Re'16 14 11  'ncR$ ALT $R'16 14 13  'sw$ ALKPHOS 67 62 65  BILITOT 0.8 1.0 0.5  PROT 7.9 7.1 7.0  ALBUMIN 3.0* 2.8* 2.6*    Recent Labs Lab 11/18/2013 1740  LIPASE 20   No results found for this basename: AMMONIA,  in the last 168 hours CBC:  Recent Labs Lab 11/15/2013 1740 11/19/2013 2245 11/08/13 0900 11/09/13 0435  WBC 11.4*  --  12.7* 15.9*  NEUTROABS 8.3*  --   --  11.7*  HGB 7.0*  --  7.5* 7.2*  HCT 22.4*  --  23.5* 23.0*  MCV 98.2  --  94.8 95.8  PLT PLATELET CLUMPS NOTED ON SMEAR, COUNT APPEARS ADEQUATE PLATELET CLUMPS NOTED ON SMEAR, COUNT APPEARS ADEQUATE 112* 92*   Cardiac Enzymes:  Recent Labs Lab 11/21/2013 1740 11/02/2013 2150  TROPONINI <0.30 <0.30   BNP (last 3 results) No results found for this basename: PROBNP,  in the last 8760 hours CBG: No results found for this basename: GLUCAP,  in the last 168 hours  Recent Results (from the past 240 hour(s))  CULTURE, BLOOD (ROUTINE X 2)     Status: None   Collection Time    11/08/13 12:40 AM      Result Value Ref Range Status   Specimen Description BLOOD LEFT ANTECUBITAL   Final   Special Requests BOTTLES DRAWN AEROBIC AND  ANAEROBIC 3CC   Final   Culture  Setup Time     Final   Value: 11/08/2013 03:32     Performed at [facility]   Culture     Final   Value:        BLOOD CULTURE RECEIVED NO GROWTH TO DATE CULTURE WILL BE HELD FOR 5 DAYS BEFORE ISSUING A FINAL NEGATIVE REPORT     Performed at [facility]   Report Status PENDING   Incomplete  CULTURE, BLOOD (ROUTINE X 2)     Status: None   Collection Time    11/08/13 12:45 AM      Result Value Ref Range Status   Specimen Description BLOOD LEFT HAND   Final   Special Requests BOTTLES DRAWN AEROBIC AND ANAEROBIC 3CC   Final   Culture  Setup Time  Final   Value: 11/08/2013 03:31     Performed at [facility]   Culture     Final   Value:        BLOOD CULTURE RECEIVED NO GROWTH TO DATE CULTURE WILL BE HELD FOR 5 DAYS BEFORE ISSUING A FINAL NEGATIVE REPORT     Performed at [facility]   Report Status PENDING   Incomplete     Studies: Dg Chest 2 View  11/21/2013   CLINICAL DATA:  Cough, fever and shortness of breath.  EXAM: CHEST  2 VIEW  COMPARISON:  PA and lateral chest 01/04/2013 and 11/27/2011.  FINDINGS: Right suprahilar fullness is unchanged. Lungs appear clear. Elevation of the right hemidiaphragm is again seen. There is no pneumothorax or pleural effusion. Heart size is normal.  IMPRESSION: No acute disease.  Stable compared to prior exam.   Electronically Signed   By: Inge Rise M.D.   On: 11/18/2013 18:13   Ct Head Wo Contrast  11/09/2013   CLINICAL DATA:  Headache. Dizziness. Swelling.  EXAM: CT HEAD WITHOUT CONTRAST  CT MAXILLOFACIAL WITHOUT CONTRAST  TECHNIQUE: Multidetector CT imaging of the head and maxillofacial structures were performed using the standard protocol without intravenous contrast. Multiplanar CT image reconstructions of the maxillofacial structures were also generated.  COMPARISON:  NM PET IMAGE RESTAG (PS) SKULL BASE TO THIGH dated 11/12/2008; NM PET IMAGE SKULL BASE TO THIGH dated 07/31/2008;  NM PET IMAGE SKULL BASE TO THIGH dated 05/02/2008; MR-FACE dated 03/08/2008  FINDINGS: CT HEAD FINDINGS  This study is not truly a noncontrast head CT as contrast was administered in conjunction with abdominal CT earlier today. There is high attenuation the intravascular compartment. Enlargement of the pineal gland is present, measuring 1 cm. Tiny calcifications are present as well. This could be further evaluated on MRI. Mucosal thickening is present in the paranasal sinuses. No fluid levels. The mastoid air cells are clear. Globes appear intact. No enhancing intracranial lesions.  CT MAXILLOFACIAL FINDINGS  Negative for facial fracture. No destructive osseous lesions. Artifact is present from dental hardware. The mandibular condyles are located. Pterygoid plates intact diffuse mucosal thickening is present with sparing of the frontal sinuses. Intracranial contents appear within normal limits aside from pineal mass measuring 1 cm with calcifications. Globes appear normal. Bilateral temporomandibular joint osteoarthritis is present. Partial visualization of the cervical spine is within normal limits.  IMPRESSION: 1. No acute and intracranial abnormality. 2. Facial CT within normal limits. Mild to moderate mucosal paranasal sinus disease. 3. 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 Followup MRI brain with and without contrast could be considered for further assessment. There is no prior imaging that covers this area.   Electronically Signed   By: Dereck Ligas M.D.   On: 11/09/2013 11:12   Ct Angio Chest Pe W/cm &/or Wo Cm  11/20/2013   CLINICAL DATA:  New diagnosis of leukemia. History of non-Hodgkin's lymphoma. Shortness of breath and elevated D-dimer.  EXAM: CT ANGIOGRAPHY CHEST WITH CONTRAST  TECHNIQUE: Multidetector CT imaging of the chest was performed using the standard protocol during bolus administration of intravenous contrast. Multiplanar CT image reconstructions and MIPs were obtained to  evaluate the vascular anatomy.  CONTRAST:  100 mL OMNIPAQUE IOHEXOL 350 MG/ML SOLN  COMPARISON:  PET CT scan 11/12/2008 and CT chest 01/25/2008.  FINDINGS: Although this study is somewhat degraded by respiratory motion, no pulmonary embolus is identified. Heart size is mildly enlarged. No pleural or pericardial effusion is identified. No  axillary, hilar or mediastinal lymphadenopathy is seen. Lungs demonstrate a small focus of airspace opacity in the anterior aspect of the right upper lobe which is unchanged compared to the prior PET CT scan and is most consistent with scarring. The lungs are otherwise clear. Incidentally imaged upper abdomen shows unchanged low attenuating lesions in the liver consistent with cysts. No lytic or sclerotic bony lesion is identified.  Review of the MIP images confirms the above findings.  IMPRESSION: Negative for pulmonary embolus.  No acute finding.  Cardiomegaly.   Electronically Signed   By: Inge Rise M.D.   On: 11/24/2013 19:56   Ct Abdomen Pelvis W Contrast  11/11/2013   CLINICAL DATA:  Intermittent nausea, vomiting and diarrhea. Right lower quadrant pain for 13 days.  EXAM: CT ABDOMEN AND PELVIS WITH CONTRAST  TECHNIQUE: Multidetector CT imaging of the abdomen and pelvis was performed using the standard protocol following bolus administration of intravenous contrast.  CONTRAST:  23mL OMNIPAQUE IOHEXOL 300 MG/ML  SOLN  COMPARISON:  CT of the chest 04/21/2007  FINDINGS: There is elevation of the right hemidiaphragm. And the lung bases are unremarkable in appearance.  Numerous low-attenuation lesions are identified throughout the liver, most consistent with cysts. No focal abnormality identified within the spleen, pancreas, left adrenal gland, or left kidney.  There is new stranding in the region of the right adrenal gland which now appears somewhat ill defined and diffusely low-attenuation without focal lesion. Differential diagnosis includes infectious/inflammatory  process or early hemorrhage. There is some associated stranding surrounding the upper pole of the right kidney but otherwise, the right kidney has a normal appearance. There is normal bilateral renal excretion. The gallbladder is present.  The stomach and small bowel loops are normal in wall thickness and caliber. The appendix is well seen and has a normal appearance. There scattered colonic diverticula otherwise, the colon has a normal appearance.  Small aortocaval lymph node is 9 mm. Small left femoral lymph node is 14 mm.  There are mild degenerative changes in the spine. No suspicious lytic or blastic lesions are identified.  IMPRESSION: 1. Significant change in the appearance of the right adrenal, with diffuse low-attenuation and significant stranding in the retroperitoneum around the adrenal gland. Considerations include infectious or inflammatory process or early hemorrhage. 2. The left adrenal gland has a normal appearance. 3. Normal appendix. 4. Numerous liver cysts, stable in appearance.   Electronically Signed   By: Shon Hale M.D.   On: 11/24/2013 21:50   Ct Biopsy  11/09/2013   CLINICAL DATA:  Hodgkin lymphoma  EXAM: CT GUIDED DEEP ILIAC BONE ASPIRATION AND CORE BIOPSY  TECHNIQUE: The procedure, risks (including but not limited to bleeding, infection, organ damage ), benefits, and alternatives were explained to the patient. Questions regarding the procedure were encouraged and answered. The patient understands and consents to the procedure. Patient was placed supine on the CT gantry and limited axial scans through the pelvis were obtained. Appropriate skin entry site was identified. Skin site was marked, prepped with Betadine, draped in usual sterile fashion, and infiltrated locally with 1% lidocaine.  Intravenous Fentanyl and Versed were administered as conscious sedation during continuous cardiorespiratory monitoring by the radiology RN, with a total moderate sedation time of 10 minutes.  Under CT  fluoroscopic guidance an 11-gauge Cook trocar bone needle was advanced into the RIGHT iliac bone just lateral to the sacroiliac joint. Once needle tip position was confirmed, core and aspiration samples were obtained. The final sample was obtained  using the guiding needle itself, which was then removed. Post procedure scans show no hematoma or fracture. Patient tolerated procedure well, with no immediate complication.  IMPRESSION: 1. Technically successful CT guided RIGHT iliac bone core and aspiration biopsy.   Electronically Signed   By: Arne Cleveland M.D.   On: 11/09/2013 10:09   Ct Maxillofacial Wo Cm  11/08/2013   CLINICAL DATA:  Headache.  Dizziness.  Swelling.  EXAM: CT HEAD WITHOUT CONTRAST  CT MAXILLOFACIAL WITHOUT CONTRAST  TECHNIQUE: Multidetector CT imaging of the head and maxillofacial structures were performed using the standard protocol without intravenous contrast. Multiplanar CT image reconstructions of the maxillofacial structures were also generated.  COMPARISON:  NM PET IMAGE RESTAG (PS) SKULL BASE TO THIGH dated 11/12/2008; NM PET IMAGE SKULL BASE TO THIGH dated 07/31/2008; NM PET IMAGE SKULL BASE TO THIGH dated 05/02/2008; MR-FACE dated 03/08/2008  FINDINGS: CT HEAD FINDINGS  This study is not truly a noncontrast head CT as contrast was administered in conjunction with abdominal CT earlier today. There is high attenuation the intravascular compartment. Enlargement of the pineal gland is present, measuring 1 cm. Tiny calcifications are present as well. This could be further evaluated on MRI. Mucosal thickening is present in the paranasal sinuses. No fluid levels. The mastoid air cells are clear. Globes appear intact. No enhancing intracranial lesions.  CT MAXILLOFACIAL FINDINGS  Negative for facial fracture. No destructive osseous lesions. Artifact is present from dental hardware. The mandibular condyles are located. Pterygoid plates intact diffuse mucosal thickening is present with sparing of  the frontal sinuses. Intracranial contents appear within normal limits aside from pineal mass measuring 1 cm with calcifications. Globes appear normal. Bilateral temporomandibular joint osteoarthritis is present. Partial visualization of the cervical spine is within normal limits.  IMPRESSION: 1. No acute and intracranial abnormality. 2. Facial CT within normal limits. Mild to moderate mucosal paranasal sinus disease. 3. 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 Followup MRI brain with and without contrast could be considered for further assessment. There is no prior imaging that covers this area.   Electronically Signed   By: Dereck Ligas M.D.   On: 11/08/2013 00:49    Scheduled Meds: . aztreonam  1 g Intravenous 3 times per day  . ciprofloxacin  400 mg Intravenous Q12H  . dextromethorphan-guaiFENesin  1 tablet Oral BID  . DULoxetine  60 mg Oral QPM  . fentaNYL      . gabapentin  600 mg Oral BID  . hydrocortisone sod succinate (SOLU-CORTEF) inj  100 mg Intravenous 3 times per day  . ipratropium-albuterol  3 mL Nebulization Q6H  . midazolam      . sodium chloride  3 mL Intravenous Q12H  . vancomycin  750 mg Intravenous Q12H   Continuous Infusions: . sodium chloride Stopped (11/01/2013 2326)  . sodium chloride 100 mL/hr at 11/09/13 1052    Principal Problem:   Pyelonephritis Active Problems:   Hodgkin lymphoma   Myelodysplasia   Adrenal abnormality   Rash and nonspecific skin eruption   Nasal swelling   Acute bronchitis   Chest tightness   DOE (dyspnea on exertion)   Symptomatic anemia   Melena    Time spent: 40 minute   Massey Ruhland, [place], J  [facility] Pager [phone]. If 7PM-7AM, please contact night-coverage at [email or web address], password [facility] 11/09/2013, 3:38 PM  LOS: 2 days

## 2013-11-09 NOTE — Progress Notes (Signed)
Lacey [name] feels better. Cultures so far have been negative. She is on IV antibiotics. She got 1 unit of blood yesterday. However, her hemoglobin today is 7.2. She feels okay so I will try to hold on another transfusion. Her erythropoietin level is 370 so she may respond to Aranesp.  Her stools negative for blood.  She does have some renal insufficiency so that might decrease her bone marrow's ability to make blood.  Her appetite still a little low. Her she's had no pain.  Her vital signs looked okay. Blood pressure 143/77.  I did talk to her about Aranesp. Again, I think this be a reasonable idea. I talked her about the risk of high blood pressure in the low risk of stroke and heart attack. I told her that I felt a benefit would far outweigh a risk. She understands this and will agree to take the Aranesp.  She is awaiting the Port-A-Cath placement. There she is awaiting her bone marrow biopsy.  Her temperature does go up and down. Currently she is afebrile. Her head and neck exam don't show any adenopathy. No mucositis. Lungs are with better breath sounds. There is some slight wheezes. Cardiac exam slightly tachycardic regular. Abdomen is soft. There is no distention. She is no fluid wave. There is no guarding or rebound tenderness. Skin exam no rashes or ecchymoses.  I think she by come off the cardiac monitor.  We will see how she does today. We'll see what her blood count looks like tomorrow.  Pete E.  ephsians 2:10 

## 2013-11-10 ENCOUNTER — Telehealth: Payer: Self-pay | Admitting: Hematology & Oncology

## 2013-11-10 DIAGNOSIS — E8809 Other disorders of plasma-protein metabolism, not elsewhere classified: Secondary | ICD-10-CM

## 2013-11-10 DIAGNOSIS — R062 Wheezing: Secondary | ICD-10-CM

## 2013-11-10 DIAGNOSIS — R609 Edema, unspecified: Secondary | ICD-10-CM

## 2013-11-10 DIAGNOSIS — R0789 Other chest pain: Secondary | ICD-10-CM

## 2013-11-10 LAB — CBC WITH DIFFERENTIAL/PLATELET
Basophils Absolute: 0.1 10*3/uL (ref 0.0–0.1)
Basophils Relative: 1 % (ref 0–1)
EOS ABS: 0 10*3/uL (ref 0.0–0.7)
Eosinophils Relative: 0 % (ref 0–5)
HCT: 21 % — ABNORMAL LOW (ref 36.0–46.0)
Hemoglobin: 6.6 g/dL — CL (ref 12.0–15.0)
LYMPHS ABS: 1.3 10*3/uL (ref 0.7–4.0)
Lymphocytes Relative: 9 % — ABNORMAL LOW (ref 12–46)
MCH: 30.4 pg (ref 26.0–34.0)
MCHC: 31.4 g/dL (ref 30.0–36.0)
MCV: 96.8 fL (ref 78.0–100.0)
Monocytes Absolute: 1.6 10*3/uL — ABNORMAL HIGH (ref 0.1–1.0)
Monocytes Relative: 11 % (ref 3–12)
Neutro Abs: 11.9 10*3/uL — ABNORMAL HIGH (ref 1.7–7.7)
Neutrophils Relative %: 79 % — ABNORMAL HIGH (ref 43–77)
PLATELETS: 85 10*3/uL — AB (ref 150–400)
RBC: 2.17 MIL/uL — ABNORMAL LOW (ref 3.87–5.11)
RDW: 23 % — ABNORMAL HIGH (ref 11.5–15.5)
WBC: 14.9 10*3/uL — ABNORMAL HIGH (ref 4.0–10.5)
nRBC: 3 /100 WBC — ABNORMAL HIGH

## 2013-11-10 LAB — VANCOMYCIN, TROUGH: Vancomycin Tr: 20.3 ug/mL — ABNORMAL HIGH (ref 10.0–20.0)

## 2013-11-10 LAB — COMPREHENSIVE METABOLIC PANEL
ALBUMIN: 2.5 g/dL — AB (ref 3.5–5.2)
ALT: 15 U/L (ref 0–35)
AST: 12 U/L (ref 0–37)
Alkaline Phosphatase: 65 U/L (ref 39–117)
BUN: 18 mg/dL (ref 6–23)
CALCIUM: 8.9 mg/dL (ref 8.4–10.5)
CO2: 21 meq/L (ref 19–32)
CREATININE: 1.13 mg/dL — AB (ref 0.50–1.10)
Chloride: 107 mEq/L (ref 96–112)
GFR calc Af Amer: 60 mL/min — ABNORMAL LOW (ref >90)
GFR, EST NON AFRICAN AMERICAN: 52 mL/min — AB (ref >90)
Glucose, Bld: 189 mg/dL — ABNORMAL HIGH (ref 70–99)
Potassium: 4.1 mEq/L (ref 3.7–5.3)
Sodium: 140 mEq/L (ref 137–147)
Total Bilirubin: 0.2 mg/dL — ABNORMAL LOW (ref 0.3–1.2)
Total Protein: 6.8 g/dL (ref 6.0–8.3)

## 2013-11-10 LAB — MAGNESIUM: Magnesium: 2.1 mg/dL (ref 1.5–2.5)

## 2013-11-10 LAB — PREPARE RBC (CROSSMATCH)

## 2013-11-10 LAB — RETICULOCYTES
RBC.: 3.21 MIL/uL — ABNORMAL LOW (ref 3.87–5.11)
Retic Count, Absolute: 22.5 10*3/uL (ref 19.0–186.0)
Retic Ct Pct: 0.7 % (ref 0.4–3.1)

## 2013-11-10 MED ORDER — FUROSEMIDE 10 MG/ML IJ SOLN
20.0000 mg | Freq: Once | INTRAMUSCULAR | Status: AC
  Administered 2013-11-10: 20 mg via INTRAVENOUS
  Filled 2013-11-10: qty 4

## 2013-11-10 MED ORDER — VANCOMYCIN HCL 500 MG IV SOLR
500.0000 mg | Freq: Two times a day (BID) | INTRAVENOUS | Status: DC
  Administered 2013-11-10 – 2013-11-12 (×4): 500 mg via INTRAVENOUS
  Filled 2013-11-10 (×5): qty 500

## 2013-11-10 NOTE — Evaluation (Signed)
Physical Therapy One Time Evaluation Patient Details Name: Lacey [name] MRN: [id number] DOB: 1955/11/02 Today's Date: 11/10/2013 Time: 7035-0093 PT Time Calculation (min): 11 min  PT Assessment / Plan / Recommendation History of Present Illness  59 y.o. WF PMHx Hodgkin's lymphoma status post chemotherapy in the pas has myelodysplastic syndrome. The patient is coming from home. Patient presents with complaints of nausea vomiting and right-sided abdominal pain. The patient mentions that her symptoms started a month ago with cough and yellowish expectoration for which she completed a course of amoxicillin despite which her cough was not getting better.since last one month she started having dyspnea on exertion which is progressively worsening.denies any orthopnea or PND.    Clinical Impression  Patient evaluated by Physical Therapy with no further acute PT needs identified. All education has been completed and the patient has no further questions.   Pt reports falls approx 2-3x per year however does not feel she needs any PT for balance deficits at this time.  Pt aware to request neurorehab outpatient from PCP if she changes her mind or increased occurrence of falls/balance issues arise.  Pt also encouraged to use RW on days she feels weak.  Pt reports spouse or her mother home all the time.  PT is signing off. Thank you for this referral.     PT Assessment  Patent does not need any further PT services    Follow Up Recommendations  No PT follow up    Does the patient have the potential to tolerate intense rehabilitation      Barriers to Discharge        Equipment Recommendations  None recommended by PT    Recommendations for Other Services     Frequency      Precautions / Restrictions Precautions Precautions: Fall   Pertinent Vitals/Pain Pt receiving 2nd unit of PRBCs, see below for vitals during ambulation, no complaint of pain      Mobility  Bed Mobility Overal bed  mobility: Modified Independent Transfers Overall transfer level: Modified independent Ambulation/Gait Ambulation/Gait assistance: Supervision Ambulation Distance (Feet): 400 Feet Assistive device: None Gait Pattern/deviations: WFL(Within Functional Limits) General Gait Details: supervision due to lines, receiving 2nd unit of PRBCs, pt denies dizziness, HR 123 and SpO2 92% on room air during ambulation    Exercises     PT Diagnosis:    PT Problem List:   PT Treatment Interventions:       PT Goals(Current goals can be found in the care plan section) Acute Rehab PT Goals PT Goal Formulation: No goals set, d/c therapy  Visit Information  Last PT Received On: 11/10/13 Assistance Needed: +1 History of Present Illness: 58 y.o. WF PMHx Hodgkin's lymphoma status post chemotherapy in the pas has myelodysplastic syndrome. The patient is coming from home. Patient presents with complaints of nausea vomiting and right-sided abdominal pain. The patient mentions that her symptoms started a month ago with cough and yellowish expectoration for which she completed a course of amoxicillin despite which her cough was not getting better.since last one month she started having dyspnea on exertion which is progressively worsening.denies any orthopnea or PND.         Prior [place] expects to be discharged to:: Private residence Living Arrangements: Spouse/significant other;Parent Type of Home: House Home Layout: One level Home Equipment: Environmental consultant - 2 wheels Prior Function Level of Independence: Independent Communication Communication: No difficulties    Cognition  Cognition Arousal/Alertness: Awake/alert Behavior During Therapy: WFL for  tasks assessed/performed Overall Cognitive Status: Within Functional Limits for tasks assessed    Extremity/Trunk Assessment Lower Extremity Assessment Lower Extremity Assessment: Overall WFL for tasks assessed   Balance    End of  Session PT - End of Session Equipment Utilized During Treatment: Gait belt Activity Tolerance: Patient tolerated treatment well Patient left: in bed;with call bell/phone within reach  GP     Billy Turvey,KATHrine E 11/10/2013, 3:20 PM Carmelia Bake, PT, DPT 11/10/2013 Pager: [phone]

## 2013-11-10 NOTE — Telephone Encounter (Signed)
Faxed Medical Records via fax today  to:  [facility] Ph: [phone] Fx: [phone]  Medical  Records requested from 09/28/2013 to present   [place] SCANNED

## 2013-11-10 NOTE — Progress Notes (Signed)
ANTIBIOTIC CONSULT NOTE - Follow Up  Pharmacy Consult for Vancomycin/aztreonam Indication: UTI/bacteremia   Allergies  Allergen Reactions  . Penicillins Hives and Rash    Patient Measurements: Height: 5' (152.4 cm) Weight: 186 lb 8.2 oz (84.6 kg) IBW/kg (Calculated) : 45.5 Adjusted Body Weight:   Vital Signs: Temp: 98 F (36.7 C) (02/13 1245) Temp src: Oral (02/13 1245) BP: 115/66 mmHg (02/13 1245) Pulse Rate: 111 (02/13 1245) Intake/Output from previous day: 02/12 0701 - 02/13 0700 In: 4090 [P.O.:840; I.V.:2400; IV Piggyback:850] Out: -  Intake/Output from this shift: Total I/O In: 606.7 [P.O.:240; Blood:366.7] Out: -   Labs:  Recent Labs  11/08/13 0900 11/09/13 0435 11/10/13 0449  WBC 12.7* 15.9* 14.9*  HGB 7.5* 7.2* 6.6*  PLT 112* 92* 85*  CREATININE 1.25* 1.28* 1.13*   Estimated Creatinine Clearance: 51.7 ml/min (by C-G formula based on Cr of 1.13). No results found for this basename: VANCOTROUGH, VANCOPEAK, VANCORANDOM, [place], [place], GENTRANDOM, [place], TOBRAPEAK, TOBRARND, AMIKACINPEAK, AMIKACINTROU, AMIKACIN,  in the last 72 hours   Microbiology: Recent Results (from the past 720 hour(s))  TECHNOLOGIST REVIEW     Status: None   Collection Time    10/13/13  8:36 AM      Result Value Ref Range Status   Technologist Review 2% Blasts, 2% metamyelocytes, 1% myelocytes   Final  CULTURE, BLOOD (ROUTINE X 2)     Status: None   Collection Time    11/08/13 12:40 AM      Result Value Ref Range Status   Specimen Description BLOOD LEFT ANTECUBITAL   Final   Special Requests BOTTLES DRAWN AEROBIC AND ANAEROBIC 3CC   Final   Culture  Setup Time     Final   Value: 11/08/2013 03:32     Performed at [facility]   Culture     Final   Value:        BLOOD CULTURE RECEIVED NO GROWTH TO DATE CULTURE WILL BE HELD FOR 5 DAYS BEFORE ISSUING A FINAL NEGATIVE REPORT     Performed at [facility]   Report Status PENDING   Incomplete  CULTURE,  BLOOD (ROUTINE X 2)     Status: None   Collection Time    11/08/13 12:45 AM      Result Value Ref Range Status   Specimen Description BLOOD LEFT HAND   Final   Special Requests BOTTLES DRAWN AEROBIC AND ANAEROBIC 3CC   Final   Culture  Setup Time     Final   Value: 11/08/2013 03:31     Performed at [facility]   Culture     Final   Value:        BLOOD CULTURE RECEIVED NO GROWTH TO DATE CULTURE WILL BE HELD FOR 5 DAYS BEFORE ISSUING A FINAL NEGATIVE REPORT     Performed at [facility]   Report Status PENDING   Incomplete    Medical History: Past Medical History  Diagnosis Date  . Cancer   . Cough productive of clear sputum 01/04/2013  . Fibromyalgia     Medications:  Anti-infectives   Start     Dose/Rate Route Frequency Ordered Stop   11/08/13 1800  vancomycin (VANCOCIN) IVPB 750 mg/150 ml premix     750 mg 150 mL/hr over 60 Minutes Intravenous Every 12 hours 11/08/13 0307     11/08/13 1000  ciprofloxacin (CIPRO) IVPB 400 mg     400 mg 200 mL/hr over 60 Minutes Intravenous Every 12  hours 11/21/2013 2325     11/08/13 0600  aztreonam (AZACTAM) 1 g in dextrose 5 % 50 mL IVPB     1 g 100 mL/hr over 30 Minutes Intravenous 3 times per day 11/08/13 0307     11/08/13 0030  vancomycin (VANCOCIN) IVPB 1000 mg/200 mL premix     1,000 mg 200 mL/hr over 60 Minutes Intravenous  Once 11/08/13 0020 11/08/13 0354   11/08/13 0000  aztreonam (AZACTAM) 2 g in dextrose 5 % 50 mL IVPB     2 g 100 mL/hr over 30 Minutes Intravenous  Once 11/16/2013 2357 11/08/13 0151   11/11/2013 2130  ciprofloxacin (CIPRO) IVPB 400 mg     400 mg 200 mL/hr over 60 Minutes Intravenous  Once 11/03/2013 2111 11/05/2013 2325     Assessment: 58 yo female with lymphoma admitted now with suspected pyelonephritis and possible upper respiratory infection. Currently treating with vancomycin, aztreonam, and Cipro  Renal function improved  WBC slightly elevated but also on concurrent steroids  Day #3  Vanc/aztreonam/Cipro  Goal of Therapy:  Vancomycin trough level 15-20 mcg/ml Aztreonam dosed based on patient weight and renal function   Plan:  1) Continue vancomycin 750mg  q12 fow now 2) Will check a vanc trough tonight prior t 6pm dose. 3) Continue current aztreonam 1g q8 4) Would recommend narrowing Cipro and Aztreonam if at all possible. Would likely d/c aztreonam if going to continue vancomycin so Cipro can still get some gram positive bacteria or would d/c Cipro and continue vanc and aztreonam   Adrian Saran, PharmD, BCPS Pager [phone] 11/10/2013 1:09 PM

## 2013-11-10 NOTE — ED Provider Notes (Signed)
Medical screening examination/treatment/procedure(s) were performed by non-physician practitioner and as supervising physician I was immediately available for consultation/collaboration.      Dontrez Pettis N Nerida Boivin, DO 11/10/13 1458 

## 2013-11-10 NOTE — Progress Notes (Signed)
Ms. Vannatter had her bone marrow test done yesterday. Hopefully, there'll be some results early next week. 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  Cultures have been negative. There is still some temperatures. She continues on IV antibiotics.  Her hemoglobin is 6.6. She is getting 2 units of blood. The count 85,000. Liver function tests looked okay. Albumin is only 2.5.  They're still somewhat wheezing. She has no purulent sputum. There's been no nausea vomiting. She's had no diarrhea.  On her physical exam, she is afebrile. Pulse is 110. Blood pressure 113/66. Oral exam is negative. Neck is supple with no adenopathy. Lungs are clear and was wheezes bilaterally. Cardiac exam tachycardia regular. Abdomen is soft. There is no fluid wave. There is no palpable hepato- splenomegaly extremities show some trace edema in her leg. Skin exam shows scattered ecchymoses.  I think the bone marrow test will be the key as to what is going on. We'll have this result next week.  Just surprised that she's not responding well to transfusions.  We will be judicious in transfusing her.  She goes for her Port-A-Cath today.  I appreciate everybody's help.  Laurey Arrow e

## 2013-11-10 NOTE — Progress Notes (Signed)
Pt did well s/p BM biopsy. Still request for Port Fevers resolved, Tmax 99.4 WBC remains elevated, but she is on Solu-cortef. Cxs negative. Hgb has dropped to 6.6 and platelets 85k  D/w Dr. Kathlene Cote, IR unable to accommodate port today. Will look at schedule for early next week, or if pt is to be discharged, can schedule to be done as an outpt.  Ascencion Dike PA-C Interventional Radiology 11/10/2013 8:34 AM

## 2013-11-10 NOTE — Progress Notes (Signed)
CRITICAL VALUE ALERT  Critical value received:  Hemoglobin 6.6  Date of notification:  11/10/13  Time of notification:  0538  Critical value read back:yes  Nurse who received alert:  M. Jaretssi Kraker RN  MD notified (1st page):  Raliegh Ip Schorr  Time of first page:  [phone]  MD notified (2nd page):  Time of second page:  Responding MD:  Lamar Blinks  Time MD responded:  [phone]

## 2013-11-10 NOTE — Progress Notes (Signed)
[facility] PROGRESS NOTE  Lacey [name] AGT:[id number] DOB: Jun 22, 1956 DOA: 11/15/2013 PCP: Vena Austria, MD  Assessment/Plan:  Pyelonephritis  -Continue aggressive hydration -CT scan of the abdomen is suggestive of possible renal stranding in the right upper pole as well as inflammation versus hemorrhage in adrenal on right side -Continue IV Cipro and IV aztreonam for dual Pseudomonas coverage. -Blood cultures negative,  -Urine culture not not culture   Symptomatically anemia,  -Secondary melena and myelodysplastic syndrome  -Hx melena one week ago resolved; negative Hemoccult. Her hemoglobin has trended downwards 2/12 hemoglobin= (7.2), overnight patient hemoglobin dropped to 6.6 -Continue to monitor -Obtain ambulatory SpO2  SOB  -symptomatic myelodysplastic syndrome (chest tightness and dyspnea); S/P transfused one unit PRBC  -Hemoccult x3 pending.  -May require GI consult.   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 Etiology unclear however if infectious should be covered by broad-spectrum antibiotic   Bronchitis  -Continue antibiotics, Tessalon Perles Mucinex.  -Headache blurred vision and nasal bridge swelling, resolving, Most likely sinusitis, will be treated by her present antibiotic coverage -Continue steroids (Solu-Cortef) per oncology -Obtain respiratory virus panel  Anemia symptomatic -2 unit PRBC per Dr. Burney Gauze (oncology)    Consultants: Dr. Burney Gauze (oncology)   Procedures: -2/12 Bone marrow biopsy report pending    Antibiotics: Aztreonam 2/11>> Ciprofloxacin 2/11>> Vancomycin 2/11>>     Code Status: Full Family Communication: Family at bedside for discussion of plan of care Disposition Plan: Resolution pyelonephritis      HPI/Subjective: Lacey [name] is a 58 y.o. WF PMHx Hodgkin's lymphoma status post chemotherapy in the pas has  myelodysplastic syndrome. The patient is coming from home. Patient presents with complaints of nausea vomiting and right-sided abdominal pain. The patient mentions that her symptoms started a month ago with cough and yellowish expectoration for which she completed a course of amoxicillin despite which her cough was not getting better.since last one month she started having dyspnea on exertion which is progressively worsening.denies any orthopnea or PND.  Since last one week she started having complaints of chest tightness on exertion along with dyspnea which resolved on rest. She is at present chest pain-free. She was on a cruise to [place] last week and off for going on cruise she started having episodes of black color bowel movements which are present on last Tuesday without any abdominal pain or nausea and resolved. She continues to have diarrhea then on and off but no melena or active bleeding. She got some medication and cruise.  Since Saturday started having complaints of pain in her right lower abdomen which was sharp and was associated with nausea and was to 6 episodes of vomiting today. She has poor appetite and has not had any today. She's taking her medications at home and there is no change in her medication and last 1 month. She denies any burning urination but she did have a fever of 102.2F yesterday.  She did have some bleeding from her nose on and off since last one month associated with redness and swelling of her nasal bridge with some blurring of vision which is present since last one week. She denies any focal neurological deficit she has chronic orthostatic dizziness which has not changed. 2/12 states decreased facial pain, decreased SOB, continued DOE. 12/13 continued productive cough, increased fatigue. Negative N./V./D. patient receiving 2 unit PRBC per Dr. Burney Gauze (oncology)      Objective: Filed Vitals:  11/10/13 1410 11/10/13 1510 11/10/13 1610 11/10/13 1945   BP: 112/61 122/76 124/79   Pulse: 108 104 104   Temp: 98.2 F (36.8 C) 98.2 F (36.8 C) 98.5 F (36.9 C)   TempSrc: Oral Oral Oral   Resp: _0 Height:      Weight:      SpO2: 97% 98% 98% 97%    Intake/Output Summary (Last 24 hours) at 11/10/13 2144 Last data filed at 11/10/13 1610  Gross per 24 hour  Intake 2769.17 ml  Output      0 ml  Net 2769.17 ml   Filed Weights   11/08/13 0520 11/09/13 0616 11/10/13 0601  Weight: 84.3 kg (185 lb 13.6 oz) 85.4 kg (188 lb 4.4 oz) 84.6 kg (186 lb 8.2 oz)    Exam:   General: A./O. x4, NAD  Cardiovascular: Tachycardic, regular rhythm, negative murmurs rubs gallop  Respiratory: Diffuse wheezing, dry hacking cough  Abdomen: Negative CVA/abdominal tenderness  Musculoskeletal: Negative pedal edema   Data Reviewed: Basic Metabolic Panel:  Recent Labs Lab 11/20/2013 1740 11/08/13 0900 11/09/13 0435 11/10/13 0449  NA 138 135* 140 140  K 3.9 4.3 4.1 4.1  CL 99 101 106 107  CO2 _1 GLUCOSE 152* 126* 202* 189*  BUN _2 CREATININE 1.08 1.25* 1.28* 1.13*  CALCIUM 9.0 8.4 8.6 8.9  MG  --   --  2.2 2.1   Liver Function Tests:  Recent Labs Lab 11/05/2013 1740 11/08/13 0900 11/09/13 0435 11/10/13 0449  AST _3 ALT _4 ALKPHOS 67 62 65 65  BILITOT 0.8 1.0 0.5 0.2*  PROT 7.9 7.1 7.0 6.8  ALBUMIN 3.0* 2.8* 2.6* 2.5*    Recent Labs Lab 10/30/2013 1740  LIPASE 20   No results found for this basename: AMMONIA,  in the last 168 hours CBC:  Recent Labs Lab 11/10/2013 1740 11/01/2013 2245 11/08/13 0900 11/09/13 0435 11/10/13 0449  WBC 11.4*  --  12.7* 15.9* 14.9*  NEUTROABS 8.3*  --   --  11.7* 11.9*  HGB 7.0*  --  7.5* 7.2* 6.6*  HCT 22.4*  --  23.5* 23.0* 21.0*  MCV 98.2  --  94.8 95.8 96.8  PLT PLATELET CLUMPS NOTED ON SMEAR, COUNT APPEARS ADEQUATE PLATELET CLUMPS NOTED ON SMEAR, COUNT APPEARS ADEQUATE 112* 92* 85*   Cardiac Enzymes:  Recent Labs Lab 11/15/2013 1740  11/10/2013 2150  TROPONINI <0.30 <0.30   BNP (last 3 results) No results found for this basename: PROBNP,  in the last 8760 hours CBG: No results found for this basename: GLUCAP,  in the last 168 hours  Recent Results (from the past 240 hour(s))  CULTURE, BLOOD (ROUTINE X 2)     Status: None   Collection Time    11/08/13 12:40 AM      Result Value Ref Range Status   Specimen Description BLOOD LEFT ANTECUBITAL   Final   Special Requests BOTTLES DRAWN AEROBIC AND ANAEROBIC 3CC   Final   Culture  Setup Time     Final   Value: 11/08/2013 03:32     Performed at [facility]   Culture     Final   Value:        BLOOD CULTURE RECEIVED NO GROWTH TO DATE CULTURE WILL BE HELD FOR 5 DAYS BEFORE ISSUING A FINAL NEGATIVE REPORT     Performed at [facility]   Report  Status PENDING   Incomplete  CULTURE, BLOOD (ROUTINE X 2)     Status: None   Collection Time    11/08/13 12:45 AM      Result Value Ref Range Status   Specimen Description BLOOD LEFT HAND   Final   Special Requests BOTTLES DRAWN AEROBIC AND ANAEROBIC 3CC   Final   Culture  Setup Time     Final   Value: 11/08/2013 03:31     Performed at [facility]   Culture     Final   Value:        BLOOD CULTURE RECEIVED NO GROWTH TO DATE CULTURE WILL BE HELD FOR 5 DAYS BEFORE ISSUING A FINAL NEGATIVE REPORT     Performed at [facility]   Report Status PENDING   Incomplete     Studies: Ct Biopsy  11/09/2013   CLINICAL DATA:  Hodgkin lymphoma  EXAM: CT GUIDED DEEP ILIAC BONE ASPIRATION AND CORE BIOPSY  TECHNIQUE: The procedure, risks (including but not limited to bleeding, infection, organ damage ), benefits, and alternatives were explained to the patient. Questions regarding the procedure were encouraged and answered. The patient understands and consents to the procedure. Patient was placed supine on the CT gantry and limited axial scans through the pelvis were obtained. Appropriate skin entry site was  identified. Skin site was marked, prepped with Betadine, draped in usual sterile fashion, and infiltrated locally with 1% lidocaine.  Intravenous Fentanyl and Versed were administered as conscious sedation during continuous cardiorespiratory monitoring by the radiology RN, with a total moderate sedation time of 10 minutes.  Under CT fluoroscopic guidance an 11-gauge Cook trocar bone needle was advanced into the RIGHT iliac bone just lateral to the sacroiliac joint. Once needle tip position was confirmed, core and aspiration samples were obtained. The final sample was obtained using the guiding needle itself, which was then removed. Post procedure scans show no hematoma or fracture. Patient tolerated procedure well, with no immediate complication.  IMPRESSION: 1. Technically successful CT guided RIGHT iliac bone core and aspiration biopsy.   Electronically Signed   By: Arne Cleveland M.D.   On: 11/09/2013 10:09    Scheduled Meds: . ciprofloxacin  400 mg Intravenous Q12H  . dextromethorphan-guaiFENesin  1 tablet Oral BID  . DULoxetine  60 mg Oral QPM  . gabapentin  600 mg Oral BID  . ipratropium-albuterol  3 mL Nebulization Q6H  . sodium chloride  3 mL Intravenous Q12H  . vancomycin  500 mg Intravenous Q12H   Continuous Infusions: . sodium chloride 100 mL/hr at 11/10/13 1520    Principal Problem:   Pyelonephritis Active Problems:   Hodgkin lymphoma   Myelodysplasia   Adrenal abnormality   Rash and nonspecific skin eruption   Nasal swelling   Acute bronchitis   Chest tightness   DOE (dyspnea on exertion)   Symptomatic anemia   Melena    Time spent: 40 minute   WOODS, [place], J  [facility] Pager [phone]. If 7PM-7AM, please contact night-coverage at [email or web address], password [facility] 11/10/2013, 9:44 PM  LOS: 3 days

## 2013-11-10 NOTE — Progress Notes (Signed)
OT Cancellation Note  Patient Details Name: Lacey [name] MRN: [id number] DOB: 1955-11-10   Cancelled Treatment:    Reason Eval/Treat Not Completed: OT screened, no needs identified, will sign off.  Spoke to pt.  She was mod I with PT, has 24/7 assistance and has no concerns around adls/bathroom transfers.  Pt does not feel she needs any DME.    Ciria Bernardini 11/10/2013, 4:07 PM [name], OTR/L [phone] 11/10/2013

## 2013-11-10 NOTE — Progress Notes (Signed)
ANTIBIOTIC CONSULT NOTE - Follow Up  Pharmacy Consult for Vancomycin Indication: UTI/bacteremia   Allergies  Allergen Reactions  . Penicillins Hives and Rash    Please see Pharmacist note from earlier today for full details   Recent Labs  11/10/13 1740  [place] 20.3*     Assessment: 58 yo female with lymphoma admitted now with suspected pyelonephritis and possible upper respiratory infection. Currently treating with vancomycin, aztreonam, and Cipro  Renal function improved  WBC slightly elevated but also on concurrent steroids  Day #3 Vanc/aztreonam/Cipro  Vancomycin trough slightly above goal at 20.3 mcg/mL  Goal of Therapy:  Vancomycin trough level 15-20 mcg/ml Aztreonam dosed based on patient weight and renal function   Plan:  1) Reduce vancomycin to 500mg  q12  2) Continue current aztreonam 1g q8 3) Would recommend narrowing Cipro and Aztreonam if at all possible. Would likely d/c aztreonam if going to continue vancomycin so Cipro can still get some gram positive bacteria or would d/c Cipro and continue vanc and aztreonam  Thank you for the consult.  Johny Drilling, PharmD, BCPS Pager: [phone] Pharmacy: [phone] 11/10/2013 6:31 PM

## 2013-11-11 ENCOUNTER — Inpatient Hospital Stay (HOSPITAL_COMMUNITY): Payer: BC Managed Care – PPO

## 2013-11-11 ENCOUNTER — Encounter (HOSPITAL_COMMUNITY): Payer: Self-pay | Admitting: Radiology

## 2013-11-11 DIAGNOSIS — J3489 Other specified disorders of nose and nasal sinuses: Secondary | ICD-10-CM

## 2013-11-11 DIAGNOSIS — R7401 Elevation of levels of liver transaminase levels: Secondary | ICD-10-CM

## 2013-11-11 DIAGNOSIS — R740 Nonspecific elevation of levels of transaminase and lactic acid dehydrogenase [LDH]: Secondary | ICD-10-CM

## 2013-11-11 LAB — COMPREHENSIVE METABOLIC PANEL
ALT: 17 U/L (ref 0–35)
AST: 15 U/L (ref 0–37)
Albumin: 2.3 g/dL — ABNORMAL LOW (ref 3.5–5.2)
Alkaline Phosphatase: 68 U/L (ref 39–117)
BUN: 20 mg/dL (ref 6–23)
CALCIUM: 8.7 mg/dL (ref 8.4–10.5)
CO2: 23 meq/L (ref 19–32)
Chloride: 106 mEq/L (ref 96–112)
Creatinine, Ser: 1.18 mg/dL — ABNORMAL HIGH (ref 0.50–1.10)
GFR, EST AFRICAN AMERICAN: 57 mL/min — AB (ref >90)
GFR, EST NON AFRICAN AMERICAN: 49 mL/min — AB (ref >90)
Glucose, Bld: 107 mg/dL — ABNORMAL HIGH (ref 70–99)
Potassium: 3.7 mEq/L (ref 3.7–5.3)
SODIUM: 141 meq/L (ref 137–147)
Total Bilirubin: 0.4 mg/dL (ref 0.3–1.2)
Total Protein: 7 g/dL (ref 6.0–8.3)

## 2013-11-11 LAB — CBC WITH DIFFERENTIAL/PLATELET
BASOS ABS: 0 10*3/uL (ref 0.0–0.1)
Basophils Relative: 0 % (ref 0–1)
Eosinophils Absolute: 0.2 10*3/uL (ref 0.0–0.7)
Eosinophils Relative: 1 % (ref 0–5)
HCT: 31 % — ABNORMAL LOW (ref 36.0–46.0)
Hemoglobin: 10.4 g/dL — ABNORMAL LOW (ref 12.0–15.0)
LYMPHS PCT: 43 % (ref 12–46)
Lymphs Abs: 7.5 10*3/uL — ABNORMAL HIGH (ref 0.7–4.0)
MCH: 31.2 pg (ref 26.0–34.0)
MCHC: 33.5 g/dL (ref 30.0–36.0)
MCV: 93.1 fL (ref 78.0–100.0)
MONOS PCT: 11 % (ref 3–12)
Monocytes Absolute: 1.9 10*3/uL — ABNORMAL HIGH (ref 0.1–1.0)
NEUTROS PCT: 45 % (ref 43–77)
Neutro Abs: 7.9 10*3/uL — ABNORMAL HIGH (ref 1.7–7.7)
PLATELETS: 81 10*3/uL — AB (ref 150–400)
RBC: 3.33 MIL/uL — ABNORMAL LOW (ref 3.87–5.11)
RDW: 21.3 % — AB (ref 11.5–15.5)
SMEAR REVIEW: DECREASED
WBC: 17.5 10*3/uL — AB (ref 4.0–10.5)

## 2013-11-11 LAB — RETICULOCYTES
RBC.: 3.33 MIL/uL — ABNORMAL LOW (ref 3.87–5.11)
RETIC CT PCT: 0.6 % (ref 0.4–3.1)
Retic Count, Absolute: 20 10*3/uL (ref 19.0–186.0)

## 2013-11-11 LAB — MAGNESIUM: Magnesium: 1.8 mg/dL (ref 1.5–2.5)

## 2013-11-11 LAB — LACTATE DEHYDROGENASE: LDH: 540 U/L — ABNORMAL HIGH (ref 94–250)

## 2013-11-11 MED ORDER — IOHEXOL 300 MG/ML  SOLN
100.0000 mL | Freq: Once | INTRAMUSCULAR | Status: AC | PRN
  Administered 2013-11-11: 100 mL via INTRAVENOUS

## 2013-11-11 MED ORDER — IPRATROPIUM-ALBUTEROL 0.5-2.5 (3) MG/3ML IN SOLN
3.0000 mL | Freq: Four times a day (QID) | RESPIRATORY_TRACT | Status: DC
  Administered 2013-11-11 – 2013-11-18 (×28): 3 mL via RESPIRATORY_TRACT
  Filled 2013-11-11 (×29): qty 3

## 2013-11-11 MED ORDER — FLUTICASONE PROPIONATE 50 MCG/ACT NA SUSP
2.0000 | Freq: Every day | NASAL | Status: DC
  Administered 2013-11-11 – 2013-12-23 (×35): 2 via NASAL
  Filled 2013-11-11 (×2): qty 16

## 2013-11-11 MED ORDER — DM-GUAIFENESIN ER 30-600 MG PO TB12
1.0000 | ORAL_TABLET | Freq: Every morning | ORAL | Status: DC
  Administered 2013-11-12 – 2013-11-17 (×6): 1 via ORAL
  Filled 2013-11-11 (×7): qty 1

## 2013-11-11 MED ORDER — FUROSEMIDE 10 MG/ML IJ SOLN
60.0000 mg | Freq: Once | INTRAMUSCULAR | Status: AC
  Administered 2013-11-11: 60 mg via INTRAVENOUS
  Filled 2013-11-11: qty 6

## 2013-11-11 MED ORDER — HYDROCODONE-HOMATROPINE 5-1.5 MG/5ML PO SYRP
5.0000 mL | ORAL_SOLUTION | Freq: Four times a day (QID) | ORAL | Status: DC | PRN
Start: 2013-11-11 — End: 2013-11-22
  Administered 2013-11-15: 5 mL via ORAL
  Filled 2013-11-11: qty 5

## 2013-11-11 MED ORDER — GUAIFENESIN-CODEINE 100-10 MG/5ML PO SOLN
10.0000 mL | Freq: Two times a day (BID) | ORAL | Status: DC | PRN
  Administered 2013-11-11 – 2013-11-14 (×2): 10 mL via ORAL
  Filled 2013-11-11 (×2): qty 10

## 2013-11-11 MED ORDER — IPRATROPIUM-ALBUTEROL 0.5-2.5 (3) MG/3ML IN SOLN
3.0000 mL | Freq: Four times a day (QID) | RESPIRATORY_TRACT | Status: DC | PRN
  Administered 2013-11-16: 3 mL via RESPIRATORY_TRACT
  Filled 2013-11-11 (×2): qty 3

## 2013-11-11 NOTE — Progress Notes (Signed)
Lacey [name] is having more in the way sinus congestion. There is a bloody sinus discharge. Given that she has immunocompromised status, I think that we need to repeat a sinus CT scan to make sure she does not have some type of opportunistic infection. She may need to have ENT see her if there is any issues on the CT scan.  There is some or wheezing. I will get a chest x-ray on her. She is quite ahead with her intake and output. We may need to give her some Lasix.  Bone marrow results not back yet. I looked at her blood smear, there may be some immature myeloid cells. I thought there may have been a blast. 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  She's had no diarrhea. She's had no nausea vomiting.  On physical exam she is afebrile. Blood pressure 140/83. Pulse is 119. Her lungs are with wheezes bilaterally. Crackles bilaterally. She does air movement. Cardiac exam tachycardic but regular. Signs exam shows a some tenderness over sinuses. No oral lesions are noted. Abdomen is soft. There is no palpable liver or spleen. His UA shows some trace edema. Skin exam some scattered ecchymoses.  She got 2 units of blood yesterday. Hemoglobin today is 10.4. White cell count 17.5. Her LDH is up to 540. BUN and creatinine are okay.  Again, I worry that this Lacey [name] is now transforming or has transformed into acute leukemia. 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  I think the LDH being elevated is a concern for leukemia or possibility of opportunistic infection.  She is negative for influenza so I don't think Tamiflu would help [place] out.  I appreciate all the great care that she's getting.  Pete E.

## 2013-11-11 NOTE — Progress Notes (Signed)
Received report from [place], Therapist, sports....reviewed assessment. No changes. Continue to monitor.

## 2013-11-11 NOTE — Progress Notes (Signed)
[facility] PROGRESS NOTE  Lacey [name] YOV:[id number] DOB: Jun 07, 1956 DOA: 11/04/2013 PCP: Vena Austria, MD  Assessment/Plan:  Pyelonephritis  -Continue aggressive hydration -CT scan of the abdomen is suggestive of possible renal stranding in the right upper pole as well as inflammation versus hemorrhage in adrenal on right side -Continue IV Cipro and IV aztreonam for dual Pseudomonas coverage. -Blood cultures negative,  -Urine culture not cultured   Symptomatically anemia,  -Secondary melena and myelodysplastic syndrome  -Hx melena one week ago resolved; negative Hemoccult. Her hemoglobin has trended downwards 2/12 hemoglobin= (7.2), overnight patient hemoglobin dropped to 6.6 -Continue to monitor -Obtain ambulatory SpO2  SOB  -symptomatic myelodysplastic syndrome (chest tightness and dyspnea); S/P transfused one unit PRBC  -Hemoccult x 1 negative  -Improved after 2 unit PRBC on 2/13   Skin eruptions  -The patient has macular papular skin eruptions that occurred after a warm water bath. Etiology unclear however if infectious should be covered by broad-spectrum antibiotic   Bronchitis  -Continue antibiotics, Mucinex.  -Headache blurred vision and nasal bridge swelling, resolved; treated by her present antibiotic coverage -Continue steroids (Solu-Cortef) per oncology -Obtain respiratory virus panel pending  Anemia symptomatic -2 unit PRBC per Dr. Burney Gauze (oncology)    Consultants: Dr. Burney Gauze (oncology)   Procedures: CT maxillofacial with contrast to/14/2015 Asymmetric fullness and homogeneous enhancement of the right  cavernous sinus as compared to the left. While the appearance is not  concerning for invasive fungal sinusitis, I could not exclude an  early leukemic infiltrate.  Chronic sinus fluid accumulation appears similar to prior study on  11/08/2013. There is increasing mucosal edema and nasal turbinate  swelling on the left  as compared with the previous study, but bony  destruction is not established. Correlate clinically. If the patient  develops severe headache or cranial nerve findings, MRI of the brain  without and with contrast recommended for further evaluation.   -2/12 Bone marrow biopsy report pending    Antibiotics: Aztreonam 2/11>> Ciprofloxacin 2/11>> Vancomycin 2/11>>     Code Status: Full Family Communication: Family at bedside for discussion of plan of care Disposition Plan: Resolution pyelonephritis      HPI/Subjective: Lacey [name] is a 58 y.o. WF PMHx Hodgkin's lymphoma status post chemotherapy in the pas has myelodysplastic syndrome. The patient is coming from home. Patient presents with complaints of nausea vomiting and right-sided abdominal pain. The patient mentions that her symptoms started a month ago with cough and yellowish expectoration for which she completed a course of amoxicillin despite which her cough was not getting better.since last one month she started having dyspnea on exertion which is progressively worsening.denies any orthopnea or PND.  Since last one week she started having complaints of chest tightness on exertion along with dyspnea which resolved on rest. She is at present chest pain-free. She was on a cruise to [place] last week and off for going on cruise she started having episodes of black color bowel movements which are present on last Tuesday without any abdominal pain or nausea and resolved. She continues to have diarrhea then on and off but no melena or active bleeding. She got some medication and cruise.  Since Saturday started having complaints of pain in her right lower abdomen which was sharp and was associated with nausea and was to 6 episodes of vomiting today. She has poor appetite and has not had any today. She's taking her medications at home and there is no change in her medication and  last 1 month. She denies any burning urination  but she did have a fever of 102.68F yesterday.  She did have some bleeding from her nose on and off since last one month associated with redness and swelling of her nasal bridge with some blurring of vision which is present since last one week. She denies any focal neurological deficit she has chronic orthostatic dizziness which has not changed. 2/12 states decreased facial pain, decreased SOB, continued DOE. 12/13 continued productive cough, increased fatigue. Negative N./V./D. patient receiving 2 unit PRBC per Dr. Burney Gauze (oncology) 2/14 patient has been up ambulating within room improved cough, decreased DOE      Objective: Filed Vitals:   11/11/13 0700 11/11/13 0755 11/11/13 1503 11/11/13 1543  BP: 140/83  129/71 142/64  Pulse: 119  119 90  Temp: 98.3 F (36.8 C)  97.8 F (36.6 C) 97.3 F (36.3 C)  TempSrc: Oral  Oral Oral  Resp: _0 Height:      Weight: 90.2 kg (198 lb 13.7 oz)     SpO2: 97% 95% 96% 95%    Intake/Output Summary (Last 24 hours) at 11/11/13 1832 Last data filed at 11/11/13 1800  Gross per 24 hour  Intake   4560 ml  Output      0 ml  Net   4560 ml   Filed Weights   11/09/13 0616 11/10/13 0601 11/11/13 0700  Weight: 85.4 kg (188 lb 4.4 oz) 84.6 kg (186 lb 8.2 oz) 90.2 kg (198 lb 13.7 oz)    Exam:   General: A./O. x4, NAD  Cardiovascular: Tachycardic, regular rhythm, negative murmurs rubs gallop  Respiratory: Diffuse wheezing, dry hacking cough (improved from 2/13)  Abdomen: Negative CVA/abdominal tenderness  Musculoskeletal: Negative pedal edema   Data Reviewed: Basic Metabolic Panel:  Recent Labs Lab 11/18/2013 1740 11/08/13 0900 11/09/13 0435 11/10/13 0449 11/11/13 0507  NA 138 135* 140 140 141  K 3.9 4.3 4.1 4.1 3.7  CL 99 101 106 107 106  CO2 _1 GLUCOSE 152* 126* 202* 189* 107*  BUN _2 CREATININE 1.08 1.25* 1.28* 1.13* 1.18*  CALCIUM 9.0 8.4 8.6 8.9 8.7  MG  --   --  2.2 2.1 1.8   Liver  Function Tests:  Recent Labs Lab 11/24/2013 1740 11/08/13 0900 11/09/13 0435 11/10/13 0449 11/11/13 0507  AST _3 ALT _4 ALKPHOS 67 62 65 65 68  BILITOT 0.8 1.0 0.5 0.2* 0.4  PROT 7.9 7.1 7.0 6.8 7.0  ALBUMIN 3.0* 2.8* 2.6* 2.5* 2.3*    Recent Labs Lab 11/09/2013 1740  LIPASE 20   No results found for this basename: AMMONIA,  in the last 168 hours CBC:  Recent Labs Lab 11/09/2013 1740 11/06/2013 2245 11/08/13 0900 11/09/13 0435 11/10/13 0449 11/11/13 0507  WBC 11.4*  --  12.7* 15.9* 14.9* 17.5*  NEUTROABS 8.3*  --   --  11.7* 11.9* 7.9*  HGB 7.0*  --  7.5* 7.2* 6.6* 10.4*  HCT 22.4*  --  23.5* 23.0* 21.0* 31.0*  MCV 98.2  --  94.8 95.8 96.8 93.1  PLT PLATELET CLUMPS NOTED ON SMEAR, COUNT APPEARS ADEQUATE PLATELET CLUMPS NOTED ON SMEAR, COUNT APPEARS ADEQUATE 112* 92* 85* 81*   Cardiac Enzymes:  Recent Labs Lab 11/24/2013 1740 10/30/2013 2150  TROPONINI <0.30 <0.30   BNP (last 3 results) No results found for this basename: PROBNP,  in the last 8760 hours CBG: No results found for this basename: GLUCAP,  in the last 168 hours  Recent Results (from the past 240 hour(s))  CULTURE, BLOOD (ROUTINE X 2)     Status: None   Collection Time    11/08/13 12:40 AM      Result Value Ref Range Status   Specimen Description BLOOD LEFT ANTECUBITAL   Final   Special Requests BOTTLES DRAWN AEROBIC AND ANAEROBIC 3CC   Final   Culture  Setup Time     Final   Value: 11/08/2013 03:32     Performed at [facility]   Culture     Final   Value:        BLOOD CULTURE RECEIVED NO GROWTH TO DATE CULTURE WILL BE HELD FOR 5 DAYS BEFORE ISSUING A FINAL NEGATIVE REPORT     Performed at [facility]   Report Status PENDING   Incomplete  CULTURE, BLOOD (ROUTINE X 2)     Status: None   Collection Time    11/08/13 12:45 AM      Result Value Ref Range Status   Specimen Description BLOOD LEFT HAND   Final   Special Requests BOTTLES DRAWN AEROBIC AND  ANAEROBIC 3CC   Final   Culture  Setup Time     Final   Value: 11/08/2013 03:31     Performed at [facility]   Culture     Final   Value:        BLOOD CULTURE RECEIVED NO GROWTH TO DATE CULTURE WILL BE HELD FOR 5 DAYS BEFORE ISSUING A FINAL NEGATIVE REPORT     Performed at [facility]   Report Status PENDING   Incomplete     Studies: Dg Chest 2 View  11/11/2013   CLINICAL DATA:  Productive cough.  EXAM: CHEST  2 VIEW  COMPARISON:  DG CHEST 2 VIEW dated 11/22/2013; CT ANGIO CHEST W/CM &/OR WO/CM dated 11/10/2013  FINDINGS: Stable cardiac and mediastinal contours with right suprahilar fullness. Persistent elevation right hemidiaphragm. No consolidative pulmonary opacities. No pleural effusion or pneumothorax. Regional skeleton is unremarkable. Anterior cervical spinal fusion hardware demonstrated. Surgical clips within the left supraclavicular soft tissues.  IMPRESSION: No acute cardiopulmonary process.   Electronically Signed   By: Lovey Newcomer M.D.   On: 11/11/2013 11:24   Ct Maxillofacial W/cm  11/11/2013   CLINICAL DATA:  Immunocompromised patient with bloody sinus discharge.  EXAM: CT MAXILLOFACIAL WITH CONTRAST  TECHNIQUE: Multidetector CT imaging of the maxillofacial structures was performed with intravenous contrast. Multiplanar CT image reconstructions were also generated. A small metallic BB was placed on the right temple in order to reliably differentiate right from left.  CONTRAST:  125m OMNIPAQUE IOHEXOL 300 MG/ML  SOLN  COMPARISON:  Noncontrast CT maxillofacial from 11/11/2013.  FINDINGS: Maxillary sinuses: Moderate mucoperiosteal thickening left greater than right without air-fluid level.  Sphenoid sinus: Moderate mucosal thickening on the left without air-fluid level.  Frontal sinuses:  Clear  Ethmoid sinuses: Mild anterior ethmoid mucoperiosteal thickening without air-fluid level.  Moderate nasal septal deviation left to right of 5 mm. Middle and inferior nasal turbinate  hypertrophy/edema on the left. There is definitely more edema of the mucosa and turbinates on the left when compared with 11/08/2013, but comparing axial slice for slice I do not see bony destruction developing since that exam.  No evidence for destructive process at the skull base. Infratemporal fat planes are preserved including the retroantral fat near  the pterygopalatine fissure.  On postcontrast images (image 58 series 9 for instance), there is asymmetric fullness and homogeneous enhancement of the right cavernous sinuses compared to the left. I cannot exclude a leukemic infiltrate in the brain as possibly having this appearance. I do not favor this appearance representing invasive fungal sinusitis as there is no bone destruction or significant sinus opacity on the right. No other similar collections are seen in those areas of the intracranial compartment which are visible.  IMPRESSION: Asymmetric fullness and homogeneous enhancement of the right cavernous sinus as compared to the left. While the appearance is not concerning for invasive fungal sinusitis, I could not exclude an early leukemic infiltrate.  Chronic sinus fluid accumulation appears similar to prior study on 11/08/2013. There is increasing mucosal edema and nasal turbinate swelling on the left as compared with the previous study, but bony destruction is not established. Correlate clinically. If the patient develops severe headache or cranial nerve findings, MRI of the brain without and with contrast recommended for further evaluation.   Electronically Signed   By: Rolla Flatten M.D.   On: 11/11/2013 11:26    Scheduled Meds: . dextromethorphan-guaiFENesin  1 tablet Oral BID  . DULoxetine  60 mg Oral QPM  . gabapentin  600 mg Oral BID  . ipratropium-albuterol  3 mL Nebulization QID  . sodium chloride  3 mL Intravenous Q12H  . vancomycin  500 mg Intravenous Q12H   Continuous Infusions: . sodium chloride 100 mL/hr at 11/11/13 1112     Principal Problem:   Pyelonephritis Active Problems:   Hodgkin lymphoma   Myelodysplasia   Adrenal abnormality   Rash and nonspecific skin eruption   Nasal swelling   Acute bronchitis   Chest tightness   DOE (dyspnea on exertion)   Symptomatic anemia   Melena    Time spent: 40 minute   WOODS, [place], J  [facility] Pager [phone]. If 7PM-7AM, please contact night-coverage at [email or web address], password [facility] 11/11/2013, 6:32 PM  LOS: 4 days

## 2013-11-12 DIAGNOSIS — M7989 Other specified soft tissue disorders: Secondary | ICD-10-CM

## 2013-11-12 DIAGNOSIS — D72829 Elevated white blood cell count, unspecified: Secondary | ICD-10-CM

## 2013-11-12 DIAGNOSIS — M79609 Pain in unspecified limb: Secondary | ICD-10-CM

## 2013-11-12 DIAGNOSIS — D696 Thrombocytopenia, unspecified: Secondary | ICD-10-CM

## 2013-11-12 DIAGNOSIS — R509 Fever, unspecified: Secondary | ICD-10-CM

## 2013-11-12 DIAGNOSIS — Z8571 Personal history of Hodgkin lymphoma: Secondary | ICD-10-CM

## 2013-11-12 LAB — RESPIRATORY VIRUS PANEL
Adenovirus: NOT DETECTED
INFLUENZA B 1: NOT DETECTED
Influenza A H1: NOT DETECTED
Influenza A H3: NOT DETECTED
Influenza A: NOT DETECTED
METAPNEUMOVIRUS: NOT DETECTED
PARAINFLUENZA 1 A: NOT DETECTED
PARAINFLUENZA 3 A: NOT DETECTED
Parainfluenza 2: NOT DETECTED
RHINOVIRUS: NOT DETECTED
Respiratory Syncytial Virus A: NOT DETECTED
Respiratory Syncytial Virus B: NOT DETECTED

## 2013-11-12 LAB — CBC WITH DIFFERENTIAL/PLATELET
BAND NEUTROPHILS: 8 % (ref 0–10)
Basophils Absolute: 0 10*3/uL (ref 0.0–0.1)
Basophils Relative: 0 % (ref 0–1)
Eosinophils Absolute: 0 10*3/uL (ref 0.0–0.7)
Eosinophils Relative: 0 % (ref 0–5)
HEMATOCRIT: 29.4 % — AB (ref 36.0–46.0)
HEMOGLOBIN: 9.7 g/dL — AB (ref 12.0–15.0)
Lymphocytes Relative: 40 % (ref 12–46)
Lymphs Abs: 8.9 10*3/uL — ABNORMAL HIGH (ref 0.7–4.0)
MCH: 31 pg (ref 26.0–34.0)
MCHC: 33 g/dL (ref 30.0–36.0)
MCV: 93.9 fL (ref 78.0–100.0)
MYELOCYTES: 1 %
Metamyelocytes Relative: 1 %
Monocytes Absolute: 1.6 10*3/uL — ABNORMAL HIGH (ref 0.1–1.0)
Monocytes Relative: 7 % (ref 3–12)
NRBC: 1 /100{WBCs} — AB
Neutro Abs: 11.8 10*3/uL — ABNORMAL HIGH (ref 1.7–7.7)
Neutrophils Relative %: 43 % (ref 43–77)
Platelets: 69 10*3/uL — ABNORMAL LOW (ref 150–400)
RBC: 3.13 MIL/uL — ABNORMAL LOW (ref 3.87–5.11)
RDW: 20.8 % — ABNORMAL HIGH (ref 11.5–15.5)
WBC: 22.3 10*3/uL — ABNORMAL HIGH (ref 4.0–10.5)

## 2013-11-12 LAB — TYPE AND SCREEN
ABO/RH(D): O POS
Antibody Screen: NEGATIVE
UNIT DIVISION: 0
Unit division: 0
Unit division: 0
Unit division: 0

## 2013-11-12 LAB — COMPREHENSIVE METABOLIC PANEL
ALBUMIN: 2.4 g/dL — AB (ref 3.5–5.2)
ALT: 19 U/L (ref 0–35)
AST: 15 U/L (ref 0–37)
Alkaline Phosphatase: 66 U/L (ref 39–117)
BILIRUBIN TOTAL: 0.5 mg/dL (ref 0.3–1.2)
BUN: 15 mg/dL (ref 6–23)
CHLORIDE: 101 meq/L (ref 96–112)
CO2: 25 mEq/L (ref 19–32)
CREATININE: 1.12 mg/dL — AB (ref 0.50–1.10)
Calcium: 8.5 mg/dL (ref 8.4–10.5)
GFR calc Af Amer: 61 mL/min — ABNORMAL LOW (ref >90)
GFR calc non Af Amer: 53 mL/min — ABNORMAL LOW (ref >90)
Glucose, Bld: 143 mg/dL — ABNORMAL HIGH (ref 70–99)
POTASSIUM: 3.5 meq/L — AB (ref 3.7–5.3)
Sodium: 139 mEq/L (ref 137–147)
Total Protein: 6.8 g/dL (ref 6.0–8.3)

## 2013-11-12 LAB — MAGNESIUM: Magnesium: 1.7 mg/dL (ref 1.5–2.5)

## 2013-11-12 LAB — RETICULOCYTES
RBC.: 3.13 MIL/uL — ABNORMAL LOW (ref 3.87–5.11)
RETIC CT PCT: 0.8 % (ref 0.4–3.1)
Retic Count, Absolute: 25 10*3/uL (ref 19.0–186.0)

## 2013-11-12 LAB — SAVE SMEAR

## 2013-11-12 LAB — LACTATE DEHYDROGENASE: LDH: 596 U/L — AB (ref 94–250)

## 2013-11-12 NOTE — Progress Notes (Signed)
VASCULAR LAB PRELIMINARY  PRELIMINARY  PRELIMINARY  PRELIMINARY  Right upper extremity venous Doppler completed.    Preliminary report:  There is no DVT noted in the right upper extremity.  There is superficial thrombosis noted in the right cephalic vein from [facility] to mid upper arm.  Jenesys Casseus, RVT 11/12/2013, 6:10 PM

## 2013-11-12 NOTE — Progress Notes (Addendum)
[facility] PROGRESS NOTE  Lacey [name] KGY:[id number] DOB: 10/03/1955 DOA: 11/25/2013 PCP: Vena Austria, MD  Assessment/Plan:  Pyelonephritis  -Continue aggressive hydration -CT scan of the abdomen is suggestive of possible renal stranding in the right upper pole as well as inflammation versus hemorrhage in adrenal on right side -Continue IV Cipro and IV aztreonam for dual Pseudomonas coverage. -Blood cultures negative,  -Urine culture not cultured   Symptomatically anemia,  -Secondary melena and myelodysplastic syndrome  -Hx melena one week ago resolved; negative Hemoccult. Her hemoglobin has trended downwards 2/12 hemoglobin= (7.2), overnight patient hemoglobin dropped to 6.6 -Continue to monitor -Ambulatory SpO2; patient ambulating 400 feet on room air HR= 123, SpO2= 92% on RA  SOB  -symptomatic myelodysplastic syndrome (chest tightness and dyspnea); S/P transfused one unit PRBC  -Hemoccult x 1 negative  -Improved after 2 unit PRBC on 2/13   Skin eruptions  -The patient has macular papular skin eruptions that occurred after a warm water bath. Etiology unclear however if infectious should be covered by broad-spectrum antibiotic -Resolved   Bronchitis  -Continue antibiotics, Mucinex.  -Headache blurred vision and nasal bridge swelling, resolved; treated by her present antibiotic coverage -Continue steroids (Solu-Cortef) per oncology -Respiratory Virus Panel; negative  Anemia symptomatic -2 unit PRBC per Dr. Burney Gauze (oncology)  Inflammation right elbow/arm -Venous Doppler ultrasound pending    Consultants: Dr. Burney Gauze (oncology)   Procedures: CT maxillofacial with contrast to/14/2015 Asymmetric fullness and homogeneous enhancement of the right  cavernous sinus as compared to the left. While the appearance is not  concerning for invasive fungal sinusitis, I could not exclude an  early leukemic infiltrate.  Chronic sinus fluid  accumulation appears similar to prior study on  11/08/2013. There is increasing mucosal edema and nasal turbinate  swelling on the left as compared with the previous study, but bony  destruction is not established. Correlate clinically. If the patient  develops severe headache or cranial nerve findings, MRI of the brain  without and with contrast recommended for further evaluation.   -2/12 Bone marrow biopsy report pending    Antibiotics: Aztreonam 2/11>> stopped 2/13 Ciprofloxacin 2/11>> stopped 2/15 Vancomycin 2/11>> stopped 2/15     Code Status: Full Family Communication: Family at bedside for discussion of plan of care Disposition Plan: Resolution pyelonephritis      HPI/Subjective: Lacey [name] is a 58 y.o. WF PMHx Hodgkin's lymphoma status post chemotherapy in the pas has myelodysplastic syndrome. The patient is coming from home. Patient presents with complaints of nausea vomiting and right-sided abdominal pain. The patient mentions that her symptoms started a month ago with cough and yellowish expectoration for which she completed a course of amoxicillin despite which her cough was not getting better.since last one month she started having dyspnea on exertion which is progressively worsening.denies any orthopnea or PND.  Since last one week she started having complaints of chest tightness on exertion along with dyspnea which resolved on rest. She is at present chest pain-free. She was on a cruise to [place] last week and off for going on cruise she started having episodes of black color bowel movements which are present on last Tuesday without any abdominal pain or nausea and resolved. She continues to have diarrhea then on and off but no melena or active bleeding. She got some medication and cruise.  Since Saturday started having complaints of pain in her right lower abdomen which was sharp and was associated with nausea and was to 6 episodes of vomiting  today.  She has poor appetite and has not had any today. She's taking her medications at home and there is no change in her medication and last 1 month. She denies any burning urination but she did have a fever of 102.26F yesterday.  She did have some bleeding from her nose on and off since last one month associated with redness and swelling of her nasal bridge with some blurring of vision which is present since last one week. She denies any focal neurological deficit she has chronic orthostatic dizziness which has not changed. 2/12 states decreased facial pain, decreased SOB, continued DOE. 12/13 continued productive cough, increased fatigue. Negative N./V./D. patient receiving 2 unit PRBC per Dr. Burney Gauze (oncology) 2/14 patient has been up ambulating within room improved cough, decreased DOE 2/15 patient states feeling improved decreased fatigue, negative DOE.      Objective: Filed Vitals:   11/11/13 2145 11/11/13 2326 11/12/13 0522 11/12/13 0743  BP: 112/60  119/56   Pulse: 94  116   Temp: 100.4 F (38 C) 98.8 F (37.1 C) 99.3 F (37.4 C)   TempSrc: Oral Oral Oral   Resp: 21  22   Height:      Weight:      SpO2: 95%  96% 92%    Intake/Output Summary (Last 24 hours) at 11/12/13 1343 Last data filed at 11/12/13 0655  Gross per 24 hour  Intake 3391.67 ml  Output      0 ml  Net 3391.67 ml   Filed Weights   11/09/13 0616 11/10/13 0601 11/11/13 0700  Weight: 85.4 kg (188 lb 4.4 oz) 84.6 kg (186 lb 8.2 oz) 90.2 kg (198 lb 13.7 oz)    Exam:   General: A./O. x4, NAD  Cardiovascular: Tachycardic, regular rhythm, negative murmurs rubs gallop  Respiratory: Wheezing Rt> Lt RUL.RLL, dry hacking cough (improved from 2/14)  Abdomen: Negative CVA/abdominal tenderness  Musculoskeletal: Negative pedal edema, swollen/erythematous right elbow/arm. Mild tenderness to touch   Data Reviewed: Basic Metabolic Panel:  Recent Labs Lab 11/08/13 0900 11/09/13 0435 11/10/13 0449  11/11/13 0507 11/12/13 0505  NA 135* 140 140 141 139  K 4.3 4.1 4.1 3.7 3.5*  CL 101 106 107 106 101  CO2 $Re'23 22 21 23 25  'bpV$ GLUCOSE 126* 202* 189* 107* 143*  BUN $Re'14 17 18 20 15  'rwD$ CREATININE 1.25* 1.28* 1.13* 1.18* 1.12*  CALCIUM 8.4 8.6 8.9 8.7 8.5  MG  --  2.2 2.1 1.8 1.7   Liver Function Tests:  Recent Labs Lab 11/08/13 0900 11/09/13 0435 11/10/13 0449 11/11/13 0507 11/12/13 0505  AST $Re'14 11 12 15 15  'jhM$ ALT $R'14 13 15 17 19  'ZR$ ALKPHOS 62 65 65 68 66  BILITOT 1.0 0.5 0.2* 0.4 0.5  PROT 7.1 7.0 6.8 7.0 6.8  ALBUMIN 2.8* 2.6* 2.5* 2.3* 2.4*    Recent Labs Lab 11/13/2013 1740  LIPASE 20   No results found for this basename: AMMONIA,  in the last 168 hours CBC:  Recent Labs Lab 11/11/2013 1740  11/08/13 0900 11/09/13 0435 11/10/13 0449 11/11/13 0507 11/12/13 0505  WBC 11.4*  --  12.7* 15.9* 14.9* 17.5* 22.3*  NEUTROABS 8.3*  --   --  11.7* 11.9* 7.9* 11.8*  HGB 7.0*  --  7.5* 7.2* 6.6* 10.4* 9.7*  HCT 22.4*  --  23.5* 23.0* 21.0* 31.0* 29.4*  MCV 98.2  --  94.8 95.8 96.8 93.1 93.9  PLT PLATELET CLUMPS NOTED ON SMEAR, COUNT APPEARS ADEQUATE  < >  112* 92* 85* 81* 69*  < > = values in this interval not displayed. Cardiac Enzymes:  Recent Labs Lab 11/14/2013 1740 11/06/2013 2150  TROPONINI <0.30 <0.30   BNP (last 3 results) No results found for this basename: PROBNP,  in the last 8760 hours CBG: No results found for this basename: GLUCAP,  in the last 168 hours  Recent Results (from the past 240 hour(s))  CULTURE, BLOOD (ROUTINE X 2)     Status: None   Collection Time    11/08/13 12:40 AM      Result Value Ref Range Status   Specimen Description BLOOD LEFT ANTECUBITAL   Final   Special Requests BOTTLES DRAWN AEROBIC AND ANAEROBIC 3CC   Final   Culture  Setup Time     Final   Value: 11/08/2013 03:32     Performed at [facility]   Culture     Final   Value:        BLOOD CULTURE RECEIVED NO GROWTH TO DATE CULTURE WILL BE HELD FOR 5 DAYS BEFORE ISSUING A FINAL  NEGATIVE REPORT     Performed at [facility]   Report Status PENDING   Incomplete  CULTURE, BLOOD (ROUTINE X 2)     Status: None   Collection Time    11/08/13 12:45 AM      Result Value Ref Range Status   Specimen Description BLOOD LEFT HAND   Final   Special Requests BOTTLES DRAWN AEROBIC AND ANAEROBIC 3CC   Final   Culture  Setup Time     Final   Value: 11/08/2013 03:31     Performed at [facility]   Culture     Final   Value:        BLOOD CULTURE RECEIVED NO GROWTH TO DATE CULTURE WILL BE HELD FOR 5 DAYS BEFORE ISSUING A FINAL NEGATIVE REPORT     Performed at [facility]   Report Status PENDING   Incomplete  RESPIRATORY VIRUS PANEL     Status: None   Collection Time    11/11/13 12:00 AM      Result Value Ref Range Status   Source - RVPAN NASOPHARYNGEAL   Final   Respiratory Syncytial Virus A NOT DETECTED   Final   Respiratory Syncytial Virus B NOT DETECTED   Final   Influenza A NOT DETECTED   Final   Influenza B NOT DETECTED   Final   Parainfluenza 1 NOT DETECTED   Final   Parainfluenza 2 NOT DETECTED   Final   Parainfluenza 3 NOT DETECTED   Final   Metapneumovirus NOT DETECTED   Final   Rhinovirus NOT DETECTED   Final   Adenovirus NOT DETECTED   Final   Influenza A H1 NOT DETECTED   Final   Influenza A H3 NOT DETECTED   Final   Comment: (NOTE)           Normal Reference Range for each Analyte: NOT DETECTED     Testing performed using the Luminex xTAG Respiratory Viral Panel test     kit.     This test was developed and its performance characteristics determined     by [facility]. It has not been cleared or approved by the [place]     Food and Drug Administration. This test is used for clinical purposes.     It should not be regarded as investigational or for research. This     laboratory is certified  under the Clinical Laboratory Improvement     Amendments of 1988 (CLIA) as qualified to perform high complexity     clinical laboratory  testing.     Performed at [facility]     Studies: Dg Chest 2 View  11/11/2013   CLINICAL DATA:  Productive cough.  EXAM: CHEST  2 VIEW  COMPARISON:  DG CHEST 2 VIEW dated 11/06/2013; CT ANGIO CHEST W/CM &/OR WO/CM dated 11/14/2013  FINDINGS: Stable cardiac and mediastinal contours with right suprahilar fullness. Persistent elevation right hemidiaphragm. No consolidative pulmonary opacities. No pleural effusion or pneumothorax. Regional skeleton is unremarkable. Anterior cervical spinal fusion hardware demonstrated. Surgical clips within the left supraclavicular soft tissues.  IMPRESSION: No acute cardiopulmonary process.   Electronically Signed   By: Lovey Newcomer M.D.   On: 11/11/2013 11:24   Ct Maxillofacial W/cm  11/11/2013   CLINICAL DATA:  Immunocompromised patient with bloody sinus discharge.  EXAM: CT MAXILLOFACIAL WITH CONTRAST  TECHNIQUE: Multidetector CT imaging of the maxillofacial structures was performed with intravenous contrast. Multiplanar CT image reconstructions were also generated. A small metallic BB was placed on the right temple in order to reliably differentiate right from left.  CONTRAST:  194mL OMNIPAQUE IOHEXOL 300 MG/ML  SOLN  COMPARISON:  Noncontrast CT maxillofacial from 11/03/2013.  FINDINGS: Maxillary sinuses: Moderate mucoperiosteal thickening left greater than right without air-fluid level.  Sphenoid sinus: Moderate mucosal thickening on the left without air-fluid level.  Frontal sinuses:  Clear  Ethmoid sinuses: Mild anterior ethmoid mucoperiosteal thickening without air-fluid level.  Moderate nasal septal deviation left to right of 5 mm. Middle and inferior nasal turbinate hypertrophy/edema on the left. There is definitely more edema of the mucosa and turbinates on the left when compared with 11/08/2013, but comparing axial slice for slice I do not see bony destruction developing since that exam.  No evidence for destructive process at the skull base. Infratemporal  fat planes are preserved including the retroantral fat near the pterygopalatine fissure.  On postcontrast images (image 58 series 9 for instance), there is asymmetric fullness and homogeneous enhancement of the right cavernous sinuses compared to the left. I cannot exclude a leukemic infiltrate in the brain as possibly having this appearance. I do not favor this appearance representing invasive fungal sinusitis as there is no bone destruction or significant sinus opacity on the right. No other similar collections are seen in those areas of the intracranial compartment which are visible.  IMPRESSION: Asymmetric fullness and homogeneous enhancement of the right cavernous sinus as compared to the left. While the appearance is not concerning for invasive fungal sinusitis, I could not exclude an early leukemic infiltrate.  Chronic sinus fluid accumulation appears similar to prior study on 11/08/2013. There is increasing mucosal edema and nasal turbinate swelling on the left as compared with the previous study, but bony destruction is not established. Correlate clinically. If the patient develops severe headache or cranial nerve findings, MRI of the brain without and with contrast recommended for further evaluation.   Electronically Signed   By: Rolla Flatten M.D.   On: 11/11/2013 11:26    Scheduled Meds: . dextromethorphan-guaiFENesin  1 tablet Oral q morning - 10a  . DULoxetine  60 mg Oral QPM  . fluticasone  2 spray Each Nare Daily  . gabapentin  600 mg Oral BID  . ipratropium-albuterol  3 mL Nebulization QID  . sodium chloride  3 mL Intravenous Q12H  . vancomycin  500 mg Intravenous Q12H   Continuous Infusions: .  sodium chloride 100 mL/hr at 11/12/13 8867    Principal Problem:   Pyelonephritis Active Problems:   Hodgkin lymphoma   Myelodysplasia   Adrenal abnormality   Rash and nonspecific skin eruption   Nasal swelling   Acute bronchitis   Chest tightness   DOE (dyspnea on exertion)    Symptomatic anemia   Melena    Time spent: 40 minute   Sriman Tally, [place], J  [facility] Pager [phone]. If 7PM-7AM, please contact night-coverage at [email or web address], password [facility] 11/12/2013, 1:43 PM  LOS: 5 days

## 2013-11-12 NOTE — Progress Notes (Signed)
Shantrice Rodenberg [place]   DOB:Nov 08, 1955   MW#:[id number]    Subjective: The patient is feeling better except she now had onset of right upper extremity swelling and pain. The family thought it is related to recent IV site on the right antecubital fossa. The whole arm is very swollen and painful to move. She is now receiving IV fluids on the left side. She denies any fevers or chills. No dysuria, frequency or urgency. Denies any nausea or vomiting. Objective:  Filed Vitals:   11/12/13 1448  BP: 113/64  Pulse: 106  Temp: 98.2 F (36.8 C)  Resp: 20     Intake/Output Summary (Last 24 hours) at 11/12/13 1551 Last data filed at 11/12/13 1449  Gross per 24 hour  Intake 2411.67 ml  Output      0 ml  Net 2411.67 ml    GENERAL:alert, no distress and comfortable SKIN: Noted significant weakness around the antecubital., Resemble possible cellulitis  EYES: normal, Conjunctiva are pink and non-injected, sclera clear OROPHARYNX:no exudate, no erythema and lips, buccal mucosa, and tongue normal  NECK: supple, thyroid normal size, non-tender, without nodularity LYMPH:  no palpable lymphadenopathy in the cervical, axillary or inguinal LUNGS: clear to auscultation and percussion with normal breathing effort HEART: regular rate & rhythm and no murmurs and no lower extremity edema ABDOMEN:abdomen soft, non-tender and normal bowel sounds Musculoskeletal:no cyanosis of digits and no clubbing . Her right upper extremity is very swollen and tender to touch NEURO: alert & oriented x 3 with fluent speech, no focal motor/sensory deficits   Labs:  Lab Results  Component Value Date   WBC 22.3* 11/12/2013   HGB 9.7* 11/12/2013   HCT 29.4* 11/12/2013   MCV 93.9 11/12/2013   PLT 69* 11/12/2013   NEUTROABS 11.8* 11/12/2013    Lab Results  Component Value Date   NA 139 11/12/2013   K 3.5* 11/12/2013   CL 101 11/12/2013   CO2 25 11/12/2013    Studies:  Dg Chest 2 View  11/11/2013   CLINICAL DATA:  Productive  cough.  EXAM: CHEST  2 VIEW  COMPARISON:  DG CHEST 2 VIEW dated 11/04/2013; CT ANGIO CHEST W/CM &/OR WO/CM dated 11/22/2013  FINDINGS: Stable cardiac and mediastinal contours with right suprahilar fullness. Persistent elevation right hemidiaphragm. No consolidative pulmonary opacities. No pleural effusion or pneumothorax. Regional skeleton is unremarkable. Anterior cervical spinal fusion hardware demonstrated. Surgical clips within the left supraclavicular soft tissues.  IMPRESSION: No acute cardiopulmonary process.   Electronically Signed   By: Lovey Newcomer M.D.   On: 11/11/2013 11:24   Ct Maxillofacial W/cm  11/11/2013   CLINICAL DATA:  Immunocompromised patient with bloody sinus discharge.  EXAM: CT MAXILLOFACIAL WITH CONTRAST  TECHNIQUE: Multidetector CT imaging of the maxillofacial structures was performed with intravenous contrast. Multiplanar CT image reconstructions were also generated. A small metallic BB was placed on the right temple in order to reliably differentiate right from left.  CONTRAST:  166m OMNIPAQUE IOHEXOL 300 MG/ML  SOLN  COMPARISON:  Noncontrast CT maxillofacial from 11/05/2013.  FINDINGS: Maxillary sinuses: Moderate mucoperiosteal thickening left greater than right without air-fluid level.  Sphenoid sinus: Moderate mucosal thickening on the left without air-fluid level.  Frontal sinuses:  Clear  Ethmoid sinuses: Mild anterior ethmoid mucoperiosteal thickening without air-fluid level.  Moderate nasal septal deviation left to right of 5 mm. Middle and inferior nasal turbinate hypertrophy/edema on the left. There is definitely more edema of the mucosa and turbinates on the left when compared with 11/08/2013,  but comparing axial slice for slice I do not see bony destruction developing since that exam.  No evidence for destructive process at the skull base. Infratemporal fat planes are preserved including the retroantral fat near the pterygopalatine fissure.  On postcontrast images (image 58  series 9 for instance), there is asymmetric fullness and homogeneous enhancement of the right cavernous sinuses compared to the left. I cannot exclude a leukemic infiltrate in the brain as possibly having this appearance. I do not favor this appearance representing invasive fungal sinusitis as there is no bone destruction or significant sinus opacity on the right. No other similar collections are seen in those areas of the intracranial compartment which are visible.  IMPRESSION: Asymmetric fullness and homogeneous enhancement of the right cavernous sinus as compared to the left. While the appearance is not concerning for invasive fungal sinusitis, I could not exclude an early leukemic infiltrate.  Chronic sinus fluid accumulation appears similar to prior study on 11/08/2013. There is increasing mucosal edema and nasal turbinate swelling on the left as compared with the previous study, but bony destruction is not established. Correlate clinically. If the patient develops severe headache or cranial nerve findings, MRI of the brain without and with contrast recommended for further evaluation.   Electronically Signed   By: John  Curnes M.D.   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 Continue supportive care #2 leukocytosis and low-grade fever All cultures are negative. CT scan show abnormalities and fullness in the right cavernous sinus. All antibiotics have been discontinued. I suspect the low-grade fever could be related to underlying disease. #3 right upper arm swelling and pain Clinically it appears that she may have superficial thrombophlebitis. I recommend ultrasound venous Doppler to rule out DVT #4 anemia This is likely anemia of chronic disease. The patient denies recent history of bleeding such as epistaxis, hematuria or hematochezia. She is  asymptomatic from the anemia. We will observe for now.  She does not require transfusion now.  #5 thrombocytopenia This is likely related to antibiotic therapy Vancomycin has been discontinued Recommend observation. The patient is not symptomatic #5 abnormal CT sinus Clinically the patient is not symptomatic. We might have to get ENT involved if she starts to be symptomatic to consider further evaluation. Today, she denies significant sinusitis or headache. #6 DVT prophylaxis Currently the patient is not receiving Lovenox. Would consider starting her on Lovenox if platelet count remains stable tomorrow.  , , MD 11/12/2013  3:51 PM   

## 2013-11-12 NOTE — Progress Notes (Signed)
Patient temp 101.4, PRN tylenol given and rechecked temperature still 101.8, notified Dr. Sherral Hammers, waiting for a call back from Dr. Janna Arch reported off to night shift nurse to F/U as well.

## 2013-11-13 ENCOUNTER — Ambulatory Visit (HOSPITAL_COMMUNITY): Payer: BC Managed Care – PPO

## 2013-11-13 ENCOUNTER — Inpatient Hospital Stay (HOSPITAL_COMMUNITY): Payer: BC Managed Care – PPO

## 2013-11-13 ENCOUNTER — Encounter (HOSPITAL_COMMUNITY): Payer: Self-pay | Admitting: Radiology

## 2013-11-13 DIAGNOSIS — E2749 Other adrenocortical insufficiency: Secondary | ICD-10-CM | POA: Diagnosis present

## 2013-11-13 LAB — CBC WITH DIFFERENTIAL/PLATELET
BASOS ABS: 0.4 10*3/uL — AB (ref 0.0–0.1)
BASOS PCT: 2 % — AB (ref 0–1)
EOS ABS: 0.2 10*3/uL (ref 0.0–0.7)
Eosinophils Relative: 1 % (ref 0–5)
HCT: 30.4 % — ABNORMAL LOW (ref 36.0–46.0)
HEMOGLOBIN: 10.1 g/dL — AB (ref 12.0–15.0)
LYMPHS PCT: 14 % (ref 12–46)
Lymphs Abs: 3 10*3/uL (ref 0.7–4.0)
MCH: 31.2 pg (ref 26.0–34.0)
MCHC: 33.2 g/dL (ref 30.0–36.0)
MCV: 93.8 fL (ref 78.0–100.0)
Monocytes Absolute: 1.1 10*3/uL — ABNORMAL HIGH (ref 0.1–1.0)
Monocytes Relative: 5 % (ref 3–12)
Neutro Abs: 16.8 10*3/uL — ABNORMAL HIGH (ref 1.7–7.7)
Neutrophils Relative %: 78 % — ABNORMAL HIGH (ref 43–77)
Platelets: 63 10*3/uL — ABNORMAL LOW (ref 150–400)
RBC: 3.24 MIL/uL — ABNORMAL LOW (ref 3.87–5.11)
RDW: 20 % — AB (ref 11.5–15.5)
WBC: 21.5 10*3/uL — ABNORMAL HIGH (ref 4.0–10.5)

## 2013-11-13 LAB — BASIC METABOLIC PANEL
BUN: 16 mg/dL (ref 6–23)
CHLORIDE: 104 meq/L (ref 96–112)
CO2: 24 meq/L (ref 19–32)
Calcium: 7.8 mg/dL — ABNORMAL LOW (ref 8.4–10.5)
Creatinine, Ser: 2.1 mg/dL — ABNORMAL HIGH (ref 0.50–1.10)
GFR calc Af Amer: 29 mL/min — ABNORMAL LOW (ref >90)
GFR calc non Af Amer: 25 mL/min — ABNORMAL LOW (ref >90)
GLUCOSE: 87 mg/dL (ref 70–99)
POTASSIUM: 3.2 meq/L — AB (ref 3.7–5.3)
Sodium: 139 mEq/L (ref 137–147)

## 2013-11-13 LAB — COMPREHENSIVE METABOLIC PANEL
ALT: 17 U/L (ref 0–35)
AST: 16 U/L (ref 0–37)
Albumin: 2.3 g/dL — ABNORMAL LOW (ref 3.5–5.2)
Alkaline Phosphatase: 70 U/L (ref 39–117)
BUN: 12 mg/dL (ref 6–23)
CALCIUM: 8.3 mg/dL — AB (ref 8.4–10.5)
CO2: 26 mEq/L (ref 19–32)
Chloride: 100 mEq/L (ref 96–112)
Creatinine, Ser: 1.06 mg/dL (ref 0.50–1.10)
GFR calc Af Amer: 65 mL/min — ABNORMAL LOW (ref >90)
GFR calc non Af Amer: 56 mL/min — ABNORMAL LOW (ref >90)
Glucose, Bld: 126 mg/dL — ABNORMAL HIGH (ref 70–99)
Potassium: 3.3 mEq/L — ABNORMAL LOW (ref 3.7–5.3)
SODIUM: 139 meq/L (ref 137–147)
TOTAL PROTEIN: 6.9 g/dL (ref 6.0–8.3)
Total Bilirubin: 0.6 mg/dL (ref 0.3–1.2)

## 2013-11-13 LAB — RETICULOCYTES
RBC.: 3.24 MIL/uL — AB (ref 3.87–5.11)
RETIC COUNT ABSOLUTE: 25.9 10*3/uL (ref 19.0–186.0)
Retic Ct Pct: 0.8 % (ref 0.4–3.1)

## 2013-11-13 LAB — URINALYSIS, ROUTINE W REFLEX MICROSCOPIC
BILIRUBIN URINE: NEGATIVE
GLUCOSE, UA: NEGATIVE mg/dL
HGB URINE DIPSTICK: NEGATIVE
Ketones, ur: NEGATIVE mg/dL
Leukocytes, UA: NEGATIVE
Nitrite: NEGATIVE
PROTEIN: 30 mg/dL — AB
Specific Gravity, Urine: 1.039 — ABNORMAL HIGH (ref 1.005–1.030)
UROBILINOGEN UA: 0.2 mg/dL (ref 0.0–1.0)
pH: 6 (ref 5.0–8.0)

## 2013-11-13 LAB — LACTATE DEHYDROGENASE: LDH: 644 U/L — ABNORMAL HIGH (ref 94–250)

## 2013-11-13 LAB — MAGNESIUM
Magnesium: 1.7 mg/dL (ref 1.5–2.5)
Magnesium: 1.6 mg/dL (ref 1.5–2.5)

## 2013-11-13 LAB — SEDIMENTATION RATE: SED RATE: 131 mm/h — AB (ref 0–22)

## 2013-11-13 LAB — URINE MICROSCOPIC-ADD ON

## 2013-11-13 LAB — LACTIC ACID, PLASMA: Lactic Acid, Venous: 1.2 mmol/L (ref 0.5–2.2)

## 2013-11-13 LAB — PHOSPHORUS: Phosphorus: 4 mg/dL (ref 2.3–4.6)

## 2013-11-13 LAB — MRSA PCR SCREENING: MRSA by PCR: NEGATIVE

## 2013-11-13 MED ORDER — SODIUM CHLORIDE 0.9 % IV BOLUS (SEPSIS): 500.0000 mL | Freq: Once | INTRAVENOUS | Status: DC

## 2013-11-13 MED ORDER — MORPHINE SULFATE 2 MG/ML IJ SOLN
1.0000 mg | INTRAMUSCULAR | Status: DC | PRN
  Administered 2013-11-20 – 2013-11-21 (×3): 2 mg via INTRAVENOUS
  Filled 2013-11-13 (×3): qty 1

## 2013-11-13 MED ORDER — SODIUM CHLORIDE 0.9 % IV BOLUS (SEPSIS): 500.0000 mL | Freq: Once | INTRAVENOUS | Status: AC

## 2013-11-13 MED ORDER — SODIUM CHLORIDE 0.9 % IV SOLN
25.0000 mg | Freq: Once | INTRAVENOUS | Status: AC
  Administered 2013-11-13: 25 mg via INTRAVENOUS
  Filled 2013-11-13: qty 1

## 2013-11-13 MED ORDER — ACETAMINOPHEN 10 MG/ML IV SOLN
1000.0000 mg | Freq: Once | INTRAVENOUS | Status: AC
  Administered 2013-11-13: 1000 mg via INTRAVENOUS
  Filled 2013-11-13: qty 100

## 2013-11-13 MED ORDER — DEXTROSE 5 % IV SOLN: 1.0000 g | Freq: Three times a day (TID) | INTRAVENOUS | Status: DC

## 2013-11-13 MED ORDER — SODIUM CHLORIDE 0.9 % IV BOLUS (SEPSIS)
500.0000 mL | Freq: Once | INTRAVENOUS | Status: AC
  Administered 2013-11-13: 500 mL via INTRAVENOUS

## 2013-11-13 MED ORDER — SODIUM CHLORIDE 0.9 % IV BOLUS (SEPSIS)
1000.0000 mL | Freq: Once | INTRAVENOUS | Status: AC
  Administered 2013-11-13: 1000 mL via INTRAVENOUS

## 2013-11-13 MED ORDER — IOHEXOL 300 MG/ML  SOLN
100.0000 mL | Freq: Once | INTRAMUSCULAR | Status: AC | PRN
  Administered 2013-11-13: 100 mL via INTRAVENOUS

## 2013-11-13 MED ORDER — IOHEXOL 300 MG/ML  SOLN
25.0000 mL | INTRAMUSCULAR | Status: AC
  Administered 2013-11-13: 25 mL via ORAL

## 2013-11-13 MED ORDER — SODIUM CHLORIDE 0.9 % IV SOLN
500.0000 mg | INTRAVENOUS | Status: AC
  Administered 2013-11-13: 500 mg via INTRAVENOUS
  Filled 2013-11-13: qty 500

## 2013-11-13 MED ORDER — SODIUM CHLORIDE 0.9 % IV SOLN
500.0000 mg | Freq: Three times a day (TID) | INTRAVENOUS | Status: DC
  Administered 2013-11-13 – 2013-11-14 (×3): 500 mg via INTRAVENOUS
  Filled 2013-11-13 (×4): qty 500

## 2013-11-13 MED ORDER — HYDROCORTISONE NA SUCCINATE PF 100 MG IJ SOLR
100.0000 mg | Freq: Three times a day (TID) | INTRAMUSCULAR | Status: DC
  Administered 2013-11-13 – 2013-11-17 (×11): 100 mg via INTRAVENOUS
  Filled 2013-11-13 (×14): qty 2

## 2013-11-13 MED ORDER — VORICONAZOLE 200 MG IV SOLR
4.0000 mg/kg | Freq: Two times a day (BID) | INTRAVENOUS | Status: DC
  Administered 2013-11-14 (×2): 360 mg via INTRAVENOUS
  Filled 2013-11-13 (×3): qty 360

## 2013-11-13 MED ORDER — VORICONAZOLE 200 MG IV SOLR
6.0000 mg/kg | Freq: Two times a day (BID) | INTRAVENOUS | Status: AC
  Administered 2013-11-13 (×2): 540 mg via INTRAVENOUS
  Filled 2013-11-13 (×3): qty 540

## 2013-11-13 NOTE — Progress Notes (Signed)
Pt temp 103.1 oral, BP 100/39, HR 80, oxygen saturations 90% on 2 L/min.  Pt arousable.  Resp labored, expiratory wheezes. Pt pale, grey, diaphoretic.  MD notified.  Transferred to step down.

## 2013-11-13 NOTE — Progress Notes (Signed)
Tylenol 650mg  prn was given after fever of 103.1 was taken. Temperature was rechecked at 0620 and was 102.2. Will continue to monitor pt closely. Carnella [place] I

## 2013-11-13 NOTE — Progress Notes (Signed)
Pt temp 102 oral. Pt lethargic.  Pain 10/10 left lower abdomen.  Adm Norco two tablets.  Continues to complain of nausea.  MD notified.

## 2013-11-13 NOTE — Progress Notes (Signed)
She certainly does not look any better. Now having temperature spikes up to 103. Having pain in the left upper quadrant. Her LDH is going up. 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 The bone marrow test hopefully we'll have some results today.  We will get a CT scan of her abdomen now. I worry about a splenic infarct. A splenic abscess also is a possibility.  Her CT of the sinuses shows some thickening of the sinus walls. No destructive lesions are noted. There may be some enhancement of the cavernous sinus.  She is off antibiotics. We'll have to get cultures on her. She does not look septic. There is no shortness of breath.  Her vital signs are temperature 102.2. Pulse is 126. Blood pressure 122/73. Oral exam shows no exercise. No adenopathy noted on the neck. Lungs sound pretty clear. Cardiac exam tachycardia regular. Abdomen is soft. There is some tenderness in the left upper quadrant. It's hard to palpate a spleen. There is no hepatomegaly. Extremities shows no clubbing cyanosis or edema. She has no calf swelling or tenderness.  On her labs, her LDH is 644. White cell count 21.5. Platelet count 63. Hemoglobin 10.1. Albumin 2.3.  She did have a chest x-ray today. The report is not back but I did not see any obvious infiltrate.  I they were to have to get her back on broad-spectrum antibiotics. I they would have to get fungal coverage. Again, I suspect that she is quite immunocompromised and is at risk for opportunistic infections.  I have a feeling that we're going to probably need to transfer her to [facility]. 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 Again once we get the bone marrow report back, that we can figure out what needs to be done.  Frederich Cha 1:5-7

## 2013-11-13 NOTE — Progress Notes (Addendum)
[facility] PROGRESS NOTE  Lacey [name] DJM:[id number] DOB: [birth year] DOA: 11/04/2013 PCP: Vena Austria, MD  Assessment/Plan:  Pyelonephritis  -Continue aggressive hydration -CT scan of the abdomen is suggestive of possible renal stranding in the right upper pole as well as inflammation versus hemorrhage in adrenal on right side -Continue IV Cipro and IV aztreonam for dual Pseudomonas coverage. -Blood cultures negative,  -Urine culture not cultured   Symptomatically anemia,  -Secondary melena and myelodysplastic syndrome  -Hx melena one week ago resolved; negative Hemoccult. Her hemoglobin has trended downwards 2/12 hemoglobin= (7.2), overnight patient hemoglobin dropped to 6.6 -Continue to monitor -Ambulatory SpO2; patient ambulating 400 feet on room air HR= 123, SpO2= 92% on RA  SOB  -symptomatic myelodysplastic syndrome (chest tightness and dyspnea); S/P transfused one unit PRBC  -Hemoccult x 1 negative  -Improved after 2 unit PRBC on 2/13   Skin eruptions  -The patient has macular papular skin eruptions that occurred after a warm water bath. Etiology unclear however if infectious should be covered by broad-spectrum antibiotic -Resolved   Bronchitis  -Continue antibiotics, Mucinex.  -Headache blurred vision and nasal bridge swelling, resolved; treated by her present antibiotic coverage -Continue steroids (Solu-Cortef) per oncology -Respiratory Virus Panel; negative  Anemia symptomatic -2 unit PRBC per Dr. 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 Patient also exhibiting signs and symptoms of adrenal insufficiency refractory N/V, , anorexia, fever, weakness. - Transfer patient to start diet  -Obtain stat ACTH. - Obtain random cortisol - Start patient on hydrocortisone (Solu-Cortef) 100 mg IV q 8  hour    Consultants: Dr. Burney Gauze (oncology) Dr Gibson Ramp (Surgery) curbside consult   Procedures: CT abdomen pelvis with contrast 11/13/2013 1. Bilateral adrenal gland abnormality, new on the left since  11/16/2013. 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  2. New small bilateral pleural effusions.    CT maxillofacial with contrast to/14/2015 Asymmetric fullness and homogeneous enhancement of the right  cavernous sinus as compared to the left. While the appearance is not  concerning for invasive fungal sinusitis, I could not exclude an  early leukemic infiltrate.  Chronic sinus fluid accumulation appears similar to prior study on  11/08/2013. There is increasing mucosal edema and nasal turbinate  swelling on the left as compared with the previous study, but bony  destruction is not established. Correlate clinically. If the patient  develops severe headache or cranial nerve findings, MRI of the brain  without and with contrast recommended for further evaluation.   -2/12 Bone marrow biopsy report pending    Antibiotics: Aztreonam 2/11>> stopped 2/13 Ciprofloxacin 2/11>> stopped 2/15 Vancomycin 2/11>> stopped 2/15 Primaxin>> 2/16>> Voriconazole 2/16>>      Code Status: Full Family Communication: Family at bedside for discussion of plan of care Disposition Plan: Resolution pyelonephritis      HPI/Subjective: Lacey [name] is a 58 y.o. WF PMHx Hodgkin's lymphoma status post chemotherapy in the pas has myelodysplastic syndrome. The patient is coming from home. Patient presents with complaints of nausea vomiting and right-sided abdominal pain. The patient mentions that her symptoms started a month ago with cough and yellowish expectoration for which she completed a course of amoxicillin despite which her cough was not getting better.since last one month she started having dyspnea on exertion which is progressively worsening.denies any orthopnea or PND.  Since  last one week she started having complaints of chest tightness on exertion along with  dyspnea which resolved on rest. She is at present chest pain-free. She was on a cruise to [place] last week and off for going on cruise she started having episodes of black color bowel movements which are present on last Tuesday without any abdominal pain or nausea and resolved. She continues to have diarrhea then on and off but no melena or active bleeding. She got some medication and cruise.  Since Saturday started having complaints of pain in her right lower abdomen which was sharp and was associated with nausea and was to 6 episodes of vomiting today. She has poor appetite and has not had any today. She's taking her medications at home and there is no change in her medication and last 1 month. She denies any burning urination but she did have a fever of 102.30F yesterday.  She did have some bleeding from her nose on and off since last one month associated with redness and swelling of her nasal bridge with some blurring of vision which is present since last one week. She denies any focal neurological deficit she has chronic orthostatic dizziness which has not changed. 2/12 states decreased facial pain, decreased SOB, continued DOE. 12/13 continued productive cough, increased fatigue. Negative N./V./D. patient receiving 2 unit PRBC per Dr. Burney Gauze (oncology) 2/14 patient has been up ambulating within room improved cough, decreased DOE 2/15 patient states feeling improved decreased fatigue, negative DOE. 2/16 abdominal CT shows left adrenal gland infarction unknown cause. Patient was signs of symptoms of adrenal insufficiency transferred to step down unit and start hydrocortisone 100 mg q 8hr      Objective: Filed Vitals:   11/13/13 0620 11/13/13 0840 11/13/13 1250 11/13/13 1413  BP:      Pulse:      Temp: 102.2 F (39 C)   102 F (38.9 C)  TempSrc: Oral   Oral  Resp:      Height:      Weight:       SpO2:  91% 92%     Intake/Output Summary (Last 24 hours) at 11/13/13 1453 Last data filed at 11/13/13 0600  Gross per 24 hour  Intake 1733.33 ml  Output      0 ml  Net 1733.33 ml   Filed Weights   11/10/13 0601 11/11/13 0700 11/13/13 0452  Weight: 84.6 kg (186 lb 8.2 oz) 90.2 kg (198 lb 13.7 oz) 89.3 kg (196 lb 13.9 oz)    Exam:   General: A./O. x4, but very sleepy however arousable, NAD  Cardiovascular: Tachycardic, regular rhythm, negative murmurs rubs gallop  Respiratory: Wheezing Rt> Lt RUL.RLL,   Abdomen: Positive LLQ tenderness/Lt CVA tenderness, positive mild RLQ tenderness, negative right CVA tenderness  Musculoskeletal: Negative pedal edema, swollen/erythematous right elbow/arm. Mild tenderness to touch   Data Reviewed: Basic Metabolic Panel:  Recent Labs Lab 11/09/13 0435 11/10/13 0449 11/11/13 0507 11/12/13 0505 11/13/13 0445  NA 140 140 141 139 139  K 4.1 4.1 3.7 3.5* 3.3*  CL 106 107 106 101 100  CO2 $Re'22 21 23 25 26  'hma$ GLUCOSE 202* 189* 107* 143* 126*  BUN $Re'17 18 20 15 12  'Zvq$ CREATININE 1.28* 1.13* 1.18* 1.12* 1.06  CALCIUM 8.6 8.9 8.7 8.5 8.3*  MG 2.2 2.1 1.8 1.7 1.7   Liver Function Tests:  Recent Labs Lab 11/09/13 0435 11/10/13 0449 11/11/13 0507 11/12/13 0505 11/13/13 0445  AST $Re'11 12 15 15 16  'QVW$ ALT $R'13 15 17 19 17  'rt$ ALKPHOS 65 65 68 66 70  BILITOT  0.5 0.2* 0.4 0.5 0.6  PROT 7.0 6.8 7.0 6.8 6.9  ALBUMIN 2.6* 2.5* 2.3* 2.4* 2.3*    Recent Labs Lab 11/06/2013 1740  LIPASE 20   No results found for this basename: AMMONIA,  in the last 168 hours CBC:  Recent Labs Lab 11/09/13 0435 11/10/13 0449 11/11/13 0507 11/12/13 0505 11/13/13 0445  WBC 15.9* 14.9* 17.5* 22.3* 21.5*  NEUTROABS 11.7* 11.9* 7.9* 11.8* 16.8*  HGB 7.2* 6.6* 10.4* 9.7* 10.1*  HCT 23.0* 21.0* 31.0* 29.4* 30.4*  MCV 95.8 96.8 93.1 93.9 93.8  PLT 92* 85* 81* 69* 63*   Cardiac Enzymes:  Recent Labs Lab 11/12/2013 1740 11/24/2013 2150  TROPONINI <0.30 <0.30   BNP  (last 3 results) No results found for this basename: PROBNP,  in the last 8760 hours CBG: No results found for this basename: GLUCAP,  in the last 168 hours  Recent Results (from the past 240 hour(s))  CULTURE, BLOOD (ROUTINE X 2)     Status: None   Collection Time    11/08/13 12:40 AM      Result Value Ref Range Status   Specimen Description BLOOD LEFT ANTECUBITAL   Final   Special Requests BOTTLES DRAWN AEROBIC AND ANAEROBIC 3CC   Final   Culture  Setup Time     Final   Value: 11/08/2013 03:32     Performed at [facility]   Culture     Final   Value:        BLOOD CULTURE RECEIVED NO GROWTH TO DATE CULTURE WILL BE HELD FOR 5 DAYS BEFORE ISSUING A FINAL NEGATIVE REPORT     Performed at [facility]   Report Status PENDING   Incomplete  CULTURE, BLOOD (ROUTINE X 2)     Status: None   Collection Time    11/08/13 12:45 AM      Result Value Ref Range Status   Specimen Description BLOOD LEFT HAND   Final   Special Requests BOTTLES DRAWN AEROBIC AND ANAEROBIC 3CC   Final   Culture  Setup Time     Final   Value: 11/08/2013 03:31     Performed at [facility]   Culture     Final   Value:        BLOOD CULTURE RECEIVED NO GROWTH TO DATE CULTURE WILL BE HELD FOR 5 DAYS BEFORE ISSUING A FINAL NEGATIVE REPORT     Performed at [facility]   Report Status PENDING   Incomplete  RESPIRATORY VIRUS PANEL     Status: None   Collection Time    11/11/13 12:00 AM      Result Value Ref Range Status   Source - RVPAN NASOPHARYNGEAL   Final   Respiratory Syncytial Virus A NOT DETECTED   Final   Respiratory Syncytial Virus B NOT DETECTED   Final   Influenza A NOT DETECTED   Final   Influenza B NOT DETECTED   Final   Parainfluenza 1 NOT DETECTED   Final   Parainfluenza 2 NOT DETECTED   Final   Parainfluenza 3 NOT DETECTED   Final   Metapneumovirus NOT DETECTED   Final   Rhinovirus NOT DETECTED   Final   Adenovirus NOT DETECTED   Final   Influenza A H1 NOT DETECTED    Final   Influenza A H3 NOT DETECTED   Final   Comment: (NOTE)           Normal Reference Range for each  Analyte: NOT DETECTED     Testing performed using the Luminex xTAG Respiratory Viral Panel test     kit.     This test was developed and its performance characteristics determined     by [facility]. It has not been cleared or approved by the [place]     Food and Drug Administration. This test is used for clinical purposes.     It should not be regarded as investigational or for research. This     laboratory is certified under the [place] (CLIA) as qualified to perform high complexity     clinical laboratory testing.     Performed at [facility]     Studies: Ct Abdomen W Contrast  11/13/2013   CLINICAL DATA:  Left upper quadrant pain, leukemia, evaluate for splenic infarct.  EXAM: CT ABDOMEN WITH CONTRAST  TECHNIQUE: Multidetector CT imaging of the abdomen was performed using the standard protocol following bolus administration of intravenous contrast.  CONTRAST:  126mL OMNIPAQUE IOHEXOL 300 MG/ML  SOLN  COMPARISON:  Abdominal CT from 6 days  FINDINGS: BODY WALL: Unremarkable.  LOWER CHEST: There are new small bilateral pleural effusions with passive atelectasis that is mild. The effusions appears simple in water density.  ABDOMEN/PELVIS:  Liver: There are numerous bilateral low dense renal masses which appears circumscribed water density, consistent with cysts.  Biliary: Gallbladder is distended without inflammatory changes. No biliary ductal dilatation.  Pancreas: Unremarkable.  Spleen: Unremarkable.  Adrenals: The right adrenal gland remains enlarged the and indistinct. There is a hypervascular or high-density focus in the anterior limb on the right - which may represent residual functioning gland. This area fades on delayed imaging. Previous imaging showed no evidence of leukemic infiltration on the left. The left adrenal gland is  newly enlarged and irregular, with pre renal retroperitoneal infiltration. There is no evidence of active hemorrhage. The renal veins are patent.  Kidneys and ureters: No hydronephrosis or stone.  Bowel: No obstruction. Normal appendix.  OSSEOUS: No acute abnormalities.  These results were called by telephone at the time of interpretation on 11/13/2013 at 1:16 PM to Dr. Burney Gauze , who verbally acknowledged these results.  IMPRESSION: 1. Bilateral adrenal gland abnormality, new on the left since 11/09/2013. 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 2. New small bilateral pleural effusions.   Electronically Signed   By: Jorje Guild M.D.   On: 11/13/2013 13:19   Dg Chest Port 1 View  11/13/2013   CLINICAL DATA:  Fever.  EXAM: PORTABLE CHEST - 1 VIEW  COMPARISON:  11/11/2013 in 01/04/2013  FINDINGS: Lungs are hypoinflated with chronic changes in the right suprahilar region. There is worsening opacification over the lateral right perihilar region. Cardiomediastinal silhouette and remainder of the exam is unchanged.  IMPRESSION: Worsening right perihilar opacification which may be due to atelectasis or infection. Chronic changes over the right suprahilar region.   Electronically Signed   By: Marin Olp M.D.   On: 11/13/2013 08:06    Scheduled Meds: . dextromethorphan-guaiFENesin  1 tablet Oral q morning - 10a  . DULoxetine  60 mg Oral QPM  . fluticasone  2 spray Each Nare Daily  . gabapentin  600 mg Oral BID  . imipenem-cilastatin  500 mg Intravenous Q8H  . ipratropium-albuterol  3 mL Nebulization QID  . sodium chloride  3 mL Intravenous Q12H  . voriconazole (VFEND - PT WT > 85 kg) IV  6 mg/kg Intravenous Q12H  Followed by  . [START ON 11/14/2013] voriconazole (VFEND - PT WT > 85 kg) IV  4 mg/kg Intravenous Q12H   Continuous Infusions: . sodium chloride 50 mL/hr at 11/13/13 0715    Principal Problem:   Pyelonephritis Active Problems:   Hodgkin lymphoma   Myelodysplasia   Adrenal  abnormality   Rash and nonspecific skin eruption   Nasal swelling   Acute bronchitis   Chest tightness   DOE (dyspnea on exertion)   Symptomatic anemia   Melena    Time spent: 40 minute   Alexanderia Gorby, [place], J  [facility] Pager [phone]. If 7PM-7AM, please contact night-coverage at [email or web address], password [facility] 11/13/2013, 2:53 PM  LOS: 6 days

## 2013-11-13 NOTE — Progress Notes (Signed)
Name: ADIAH GUERECA MRN: [id number] DOB: 1956-08-29  ELECTRONIC ICU PHYSICIAN NOTE  Problem:  Soft bp/ fever ? Septic    Intake/Output Summary (Last 24 hours) at 11/13/13 2208 Last data filed at 11/13/13 2000  Gross per 24 hour  Intake   1484 ml  Output    225 ml  Net   1259 ml      Recent Labs Lab 11/11/13 0507 11/12/13 0505 11/13/13 0445  NA 141 139 139  K 3.7 3.5* 3.3*  CL 106 101 100  CO2 23 25 26   BUN 20 15 12   CREATININE 1.18* 1.12* 1.06  GLUCOSE 107* 143* 126*    Recent Labs Lab 11/11/13 0507 11/12/13 0505 11/13/13 0445  HGB 10.4* 9.7* 10.1*  HCT 31.0* 29.4* 30.4*  WBC 17.5* 22.3* 21.5*  PLT 81* 69* 63*      Intervention:  Vol expand /check pct/ may need cvl and art line   Christinia Gully 11/13/2013, 10:08 PM

## 2013-11-13 NOTE — Progress Notes (Signed)
ANTIBIOTIC CONSULT NOTE - INITIAL  Pharmacy Consult for voriconazole  Indication: fever, immunocompromised status, ?sinus infection  Allergies  Allergen Reactions  . Penicillins Hives and Rash    Patient Measurements: Height: 5' (152.4 cm) Weight: 196 lb 13.9 oz (89.3 kg) IBW/kg (Calculated) : 45.5  Vital Signs: Temp: 102.2 F (39 C) (02/16 0620) Temp src: Oral (02/16 0620) BP: 122/73 mmHg (02/16 0459) Pulse Rate: 126 (02/16 0459)  Labs:  Recent Labs  11/11/13 0507 11/12/13 0505 11/13/13 0445  WBC 17.5* 22.3* 21.5*  HGB 10.4* 9.7* 10.1*  PLT 81* 69* 63*  CREATININE 1.18* 1.12* 1.06   Estimated Creatinine Clearance: 56.8 ml/min (by C-G formula based on Cr of 1.06).  Assessment: 9 yof with h/o Hodgkin's lymphoma, treatment related myelodysplasia admitted 2/10 with UTI/pyelonephritis and bronchitis. Abx with aztreonam, vanc, cipro started on 2/11, CT guided bone marrow aspiration and biopsy done 2/12, cipro/aztreonam stopped 2/13, vanc stopped 2/15 as MD suspect low-grade fever could be related to underlying disease. On 2/14, pt with bloody sinus discharge, CT with abnormalities and fullness in the right cavernous sinus. Patient now with Tmax 103, LUQ abd pain. CT abd ordered to r/o splenic abscess. MD ordered to start Primaxin and adding voriconazole given immunocompromised status.    2/10 >> cipro >> 2/13 2/11 >> aztreo >> 2/13 2/11 >> vanc >> 2/15 2/16 >> primaxin (MD) >>  2/16 >> voriconazole >>   Tmax: 103.1  WBCs: 21.5K  Renal: Scr 1.06, CG 57, N 65  2/11 blood x2 >> NGTD  2/11 influenza panel >> negative  2/14 respiratory panel >> negative  2/16 blood x 2 >> ordered  2/16 urine >> ordered   Awaiting bone marrow biopsy result. Onc worried this is now transforming or has transformed into acute leukemia. If so, need to transfer to due for induction chemo as pt awaits for stem cell allogeneic transplant.    Plan:   Voriconzole 56m/kg IV q12h as loading  dose then 477mkg IV q12h as maintenance dose based on actual body weight.   Continue Primaxin as ordered by MD  F/u renal function and clinical status   ThVanessa DurhamPharmD, BCPS Pager: [phone] AM Pharmacy #: [phone]

## 2013-11-13 NOTE — Progress Notes (Signed)
RN heard pt groaning and went in to assess pt. Pt stated she the pain in her lower left chest had gotten worse. RN immediately took vitals. Temp was 103.1 and HR was in the 120's. The rest of her vital signs were at baseline. At this time, the pt starting vomiting. Upon further investigation of the pt's chart, RN noticed that all antibiotics were D/C'd on 2/15 and the last time the pt had blood cultures or a UA collected was on 2/11 and 2/10, respectively. The vomiting had subsided by this time so Vicodin PO was given. MD was notified regarding multiple changes in pt and ordered blood cultures to be drawn and for a UA to be collected. Other than that, no orders were given. Will continue to monitor pt closely. Carnella [place] I

## 2013-11-14 ENCOUNTER — Encounter (HOSPITAL_COMMUNITY): Payer: Self-pay | Admitting: *Deleted

## 2013-11-14 ENCOUNTER — Inpatient Hospital Stay (HOSPITAL_COMMUNITY): Payer: BC Managed Care – PPO

## 2013-11-14 ENCOUNTER — Encounter (HOSPITAL_COMMUNITY): Payer: Self-pay | Admitting: Anesthesiology

## 2013-11-14 DIAGNOSIS — K1239 Other oral mucositis (ulcerative): Secondary | ICD-10-CM

## 2013-11-14 DIAGNOSIS — I959 Hypotension, unspecified: Secondary | ICD-10-CM | POA: Diagnosis present

## 2013-11-14 DIAGNOSIS — I369 Nonrheumatic tricuspid valve disorder, unspecified: Secondary | ICD-10-CM

## 2013-11-14 DIAGNOSIS — D47Z9 Other specified neoplasms of uncertain behavior of lymphoid, hematopoietic and related tissue: Secondary | ICD-10-CM

## 2013-11-14 DIAGNOSIS — K121 Other forms of stomatitis: Secondary | ICD-10-CM

## 2013-11-14 DIAGNOSIS — I82609 Acute embolism and thrombosis of unspecified veins of unspecified upper extremity: Secondary | ICD-10-CM

## 2013-11-14 LAB — COMPREHENSIVE METABOLIC PANEL
ALT: 20 U/L (ref 0–35)
AST: 34 U/L (ref 0–37)
Albumin: 1.7 g/dL — ABNORMAL LOW (ref 3.5–5.2)
Alkaline Phosphatase: 64 U/L (ref 39–117)
BUN: 18 mg/dL (ref 6–23)
CALCIUM: 7.1 mg/dL — AB (ref 8.4–10.5)
CO2: 22 meq/L (ref 19–32)
Chloride: 107 mEq/L (ref 96–112)
Creatinine, Ser: 1.9 mg/dL — ABNORMAL HIGH (ref 0.50–1.10)
GFR calc Af Amer: 32 mL/min — ABNORMAL LOW (ref >90)
GFR calc non Af Amer: 28 mL/min — ABNORMAL LOW (ref >90)
Glucose, Bld: 95 mg/dL (ref 70–99)
Potassium: 3.9 mEq/L (ref 3.7–5.3)
SODIUM: 139 meq/L (ref 137–147)
Total Bilirubin: 0.3 mg/dL (ref 0.3–1.2)
Total Protein: 5.6 g/dL — ABNORMAL LOW (ref 6.0–8.3)

## 2013-11-14 LAB — PROCALCITONIN
Procalcitonin: >175 ng/mL
Procalcitonin: >175 ng/mL

## 2013-11-14 LAB — CBC WITH DIFFERENTIAL/PLATELET
BASOS PCT: 1 % (ref 0–1)
Basophils Absolute: 0.2 10*3/uL — ABNORMAL HIGH (ref 0.0–0.1)
EOS ABS: 0.2 10*3/uL (ref 0.0–0.7)
Eosinophils Relative: 1 % (ref 0–5)
HCT: 27 % — ABNORMAL LOW (ref 36.0–46.0)
HEMOGLOBIN: 8.5 g/dL — AB (ref 12.0–15.0)
LYMPHS PCT: 7 % — AB (ref 12–46)
Lymphs Abs: 1.5 10*3/uL (ref 0.7–4.0)
MCH: 30.5 pg (ref 26.0–34.0)
MCHC: 31.5 g/dL (ref 30.0–36.0)
MCV: 96.8 fL (ref 78.0–100.0)
MONO ABS: 1.7 10*3/uL — AB (ref 0.1–1.0)
Monocytes Relative: 8 % (ref 3–12)
NEUTROS PCT: 83 % — AB (ref 43–77)
Neutro Abs: 17.4 10*3/uL — ABNORMAL HIGH (ref 1.7–7.7)
Platelets: DECREASED 10*3/uL (ref 150–400)
RBC: 2.79 MIL/uL — ABNORMAL LOW (ref 3.87–5.11)
RDW: 20.3 % — ABNORMAL HIGH (ref 11.5–15.5)
WBC: 21 10*3/uL — ABNORMAL HIGH (ref 4.0–10.5)

## 2013-11-14 LAB — PLATELET COUNT: Platelets: 46 10*3/uL — ABNORMAL LOW (ref 150–400)

## 2013-11-14 LAB — LACTATE DEHYDROGENASE: LDH: 510 U/L — AB (ref 94–250)

## 2013-11-14 LAB — URINE CULTURE
COLONY COUNT: NO GROWTH
Culture: NO GROWTH

## 2013-11-14 LAB — CORTISOL-PM, BLOOD: CORTISOL - PM: 4.1 ug/dL (ref 3.1–16.7)

## 2013-11-14 LAB — TROPONIN I
Troponin I: <0.3 ng/mL (ref <0.30)
Troponin I: <0.3 ng/mL (ref <0.30)

## 2013-11-14 LAB — RETICULOCYTES
RBC.: 2.79 MIL/uL — AB (ref 3.87–5.11)
RETIC CT PCT: 0.7 % (ref 0.4–3.1)
Retic Count, Absolute: 19.5 10*3/uL (ref 19.0–186.0)

## 2013-11-14 LAB — SAVE SMEAR

## 2013-11-14 LAB — CULTURE, BLOOD (ROUTINE X 2)
Culture: NO GROWTH
Culture: NO GROWTH

## 2013-11-14 LAB — EXPECTORATED SPUTUM ASSESSMENT W GRAM STAIN, RFLX TO RESP C

## 2013-11-14 LAB — PRO B NATRIURETIC PEPTIDE: Pro B Natriuretic peptide (BNP): 2977 pg/mL — ABNORMAL HIGH (ref 0–125)

## 2013-11-14 LAB — ACTH: C206 ACTH: 107 pg/mL — ABNORMAL HIGH (ref 10–46)

## 2013-11-14 LAB — HEPARIN LEVEL (UNFRACTIONATED)

## 2013-11-14 LAB — MAGNESIUM: Magnesium: 1.6 mg/dL (ref 1.5–2.5)

## 2013-11-14 LAB — C-REACTIVE PROTEIN: CRP: 39.8 mg/dL — AB (ref <0.60)

## 2013-11-14 LAB — EXPECTORATED SPUTUM ASSESSMENT W REFEX TO RESP CULTURE

## 2013-11-14 MED ORDER — DEXTROSE 5 % IV SOLN: 5.0000 mg/kg | Freq: Three times a day (TID) | INTRAVENOUS | Status: DC

## 2013-11-14 MED ORDER — POTASSIUM CHLORIDE CRYS ER 20 MEQ PO TBCR
40.0000 meq | EXTENDED_RELEASE_TABLET | Freq: Once | ORAL | Status: AC
  Administered 2013-11-14: 40 meq via ORAL
  Filled 2013-11-14: qty 2

## 2013-11-14 MED ORDER — SODIUM CHLORIDE 0.9 % IV BOLUS (SEPSIS)
500.0000 mL | Freq: Once | INTRAVENOUS | Status: AC
  Administered 2013-11-14: 500 mL via INTRAVENOUS

## 2013-11-14 MED ORDER — SODIUM CHLORIDE 0.9 % IV SOLN
250.0000 mg | Freq: Four times a day (QID) | INTRAVENOUS | Status: DC
  Administered 2013-11-14 – 2013-11-16 (×7): 250 mg via INTRAVENOUS
  Filled 2013-11-14 (×8): qty 250

## 2013-11-14 MED ORDER — SODIUM CHLORIDE 0.9 % IJ SOLN
10.0000 mL | Freq: Two times a day (BID) | INTRAMUSCULAR | Status: DC
  Administered 2013-11-14 – 2013-11-17 (×5): 10 mL
  Administered 2013-11-19: 3 mL
  Administered 2013-11-19 – 2013-11-21 (×3): 10 mL
  Administered 2013-11-22: 20 mL
  Administered 2013-11-22: 40 mL
  Administered 2013-11-23 (×2): 10 mL
  Administered 2013-11-24: 20 mL
  Administered 2013-11-25 – 2013-12-05 (×10): 10 mL
  Administered 2013-12-20: 40 mL
  Administered 2013-12-23: 10 mL

## 2013-11-14 MED ORDER — HEPARIN BOLUS VIA INFUSION
2000.0000 [IU] | Freq: Once | INTRAVENOUS | Status: AC
  Administered 2013-11-14: 2000 [IU] via INTRAVENOUS
  Filled 2013-11-14: qty 2000

## 2013-11-14 MED ORDER — VANCOMYCIN HCL 10 G IV SOLR
1250.0000 mg | INTRAVENOUS | Status: AC
  Administered 2013-11-15 – 2013-11-21 (×7): 1250 mg via INTRAVENOUS
  Filled 2013-11-14 (×7): qty 1250

## 2013-11-14 MED ORDER — HEPARIN BOLUS VIA INFUSION
3000.0000 [IU] | Freq: Once | INTRAVENOUS | Status: AC
  Administered 2013-11-14: 3000 [IU] via INTRAVENOUS
  Filled 2013-11-14: qty 3000

## 2013-11-14 MED ORDER — SODIUM CHLORIDE 0.9 % IV SOLN
Freq: Once | INTRAVENOUS | Status: AC
  Administered 2013-11-14: 1000 mL via INTRAVENOUS

## 2013-11-14 MED ORDER — VANCOMYCIN HCL 10 G IV SOLR
2000.0000 mg | Freq: Once | INTRAVENOUS | Status: AC
  Administered 2013-11-14: 2000 mg via INTRAVENOUS
  Filled 2013-11-14: qty 2000

## 2013-11-14 MED ORDER — SODIUM CHLORIDE 0.9 % IV BOLUS (SEPSIS)
1000.0000 mL | Freq: Once | INTRAVENOUS | Status: AC
  Administered 2013-11-14: 1000 mL via INTRAVENOUS

## 2013-11-14 MED ORDER — SODIUM CHLORIDE 0.9 % IJ SOLN
10.0000 mL | INTRAMUSCULAR | Status: DC | PRN
  Administered 2013-11-17 – 2013-11-18 (×2): 20 mL
  Administered 2013-11-19 (×3): 10 mL
  Administered 2013-11-20: 20 mL
  Administered 2013-11-21 (×2): 10 mL
  Administered 2013-11-21: 20 mL
  Administered 2013-11-21 – 2013-11-24 (×2): 10 mL
  Administered 2013-11-26 (×2): 20 mL
  Administered 2013-11-27 (×2): 10 mL
  Administered 2013-11-27: 30 mL
  Administered 2013-11-28: 10 mL
  Administered 2013-11-28 – 2013-12-07 (×3): 20 mL
  Administered 2013-12-09 – 2013-12-11 (×7): 10 mL
  Administered 2013-12-12 – 2013-12-13 (×2): 20 mL
  Administered 2013-12-13 – 2013-12-14 (×3): 10 mL
  Administered 2013-12-14: 20 mL
  Administered 2013-12-14: 10 mL
  Administered 2013-12-15 – 2013-12-16 (×3): 20 mL
  Administered 2013-12-16: 10 mL
  Administered 2013-12-17: 30 mL
  Administered 2013-12-17: 20 mL
  Administered 2013-12-18 (×2): 10 mL
  Administered 2013-12-18: 20 mL
  Administered 2013-12-19: 10 mL
  Administered 2013-12-19: 20 mL
  Administered 2013-12-19 – 2013-12-21 (×2): 10 mL

## 2013-11-14 MED ORDER — HEPARIN (PORCINE) IN NACL 100-0.45 UNIT/ML-% IJ SOLN
1300.0000 [IU]/h | INTRAMUSCULAR | Status: DC
  Administered 2013-11-15: 1300 [IU]/h via INTRAVENOUS
  Filled 2013-11-14: qty 250

## 2013-11-14 MED ORDER — DEXTROSE 5 % IV SOLN
5.0000 mg/kg | Freq: Two times a day (BID) | INTRAVENOUS | Status: DC
  Administered 2013-11-14 – 2013-11-16 (×6): 230 mg via INTRAVENOUS
  Filled 2013-11-14 (×8): qty 4.6

## 2013-11-14 MED ORDER — HEPARIN (PORCINE) IN NACL 100-0.45 UNIT/ML-% IJ SOLN
1100.0000 [IU]/h | INTRAMUSCULAR | Status: DC
  Administered 2013-11-14: 1100 [IU]/h via INTRAVENOUS
  Filled 2013-11-14 (×2): qty 250

## 2013-11-14 MED ORDER — SODIUM CHLORIDE 0.9 % IV BOLUS (SEPSIS): 1000.0000 mL | Freq: Once | INTRAVENOUS | Status: DC

## 2013-11-14 MED ORDER — MAGIC MOUTHWASH W/LIDOCAINE
10.0000 mL | ORAL | Status: DC
  Administered 2013-11-14 – 2013-11-27 (×37): 10 mL via ORAL
  Filled 2013-11-14 (×93): qty 10

## 2013-11-14 NOTE — Progress Notes (Signed)
ANTICOAGULATION CONSULT NOTE - Initial Consult  Pharmacy Consult for Heparin Indication: VTE treatment  Allergies  Allergen Reactions  . Penicillins Hives and Rash    Patient Measurements: Height: 5' (152.4 cm) Weight: 212 lb 15.4 oz (96.6 kg) IBW/kg (Calculated) : 45.5 Heparin Dosing Weight: 69 kg  Vital Signs: Temp: 99.3 F (37.4 C) (02/17 0645) Temp src: Core (Comment) (02/17 0545) BP: 74/36 mmHg (02/17 0100) Pulse Rate: 114 (02/17 0645)  Labs:  Recent Labs  11/12/13 0505 11/13/13 0445 11/13/13 2313 11/14/13 0435  HGB 9.7* 10.1*  --  8.5*  HCT 29.4* 30.4*  --  27.0*  PLT 69* 63*  --  PLATELET CLUMPS NOTED ON SMEAR, COUNT APPEARS DECREASED  CREATININE 1.12* 1.06 2.10* 1.90*    Estimated Creatinine Clearance: 33.2 ml/min (by C-G formula based on Cr of 1.9).   Medical History: Past Medical History  Diagnosis Date  . Cancer   . Cough productive of clear sputum 01/04/2013  . Fibromyalgia     Medications:  Scheduled:  . acyclovir  5 mg/kg Intravenous 3 times per day  . dextromethorphan-guaiFENesin  1 tablet Oral q morning - 10a  . DULoxetine  60 mg Oral QPM  . fluticasone  2 spray Each Nare Daily  . gabapentin  600 mg Oral BID  . hydrocortisone sodium succinate  100 mg Intravenous Q8H  . imipenem-cilastatin  500 mg Intravenous Q8H  . ipratropium-albuterol  3 mL Nebulization QID  . magic mouthwash w/lidocaine  10 mL Oral Q4H  . sodium chloride  3 mL Intravenous Q12H  . voriconazole (VFEND - PT WT > 85 kg) IV  4 mg/kg Intravenous Q12H   Infusions:  . sodium chloride 100 mL/hr at 11/14/13 0800    Assessment: 3 yoF admitted 2/10 with N/V, abdominal pain, fever, and melena.  PMH includes Hodgkin's lymphoma, s/p chemo, and myelodysplastic syndrome. Doppler completed 2/15 and shows no DVT, but noted superficial thrombus in the right cephalic vein from [facility] to mid upper arm.  Pharmacy consulted to dose IV Heparin for thrombus and MD suspects hypercoagulable  status.  Baseline coags:  Admission INR 1.26, APTT 34.  Pt has not had anticoagulation, but has been using SCDs since admission.  CBC: Hgb 8.5, Plt are clumped today.  Last value of Plt = 63 on 2/16.    Hemoccult negative x1 on 11/18/2013  MD is aware of thrombocytopenia.    Pt did receive Aransep injection on 11/09/13 and 2 units PRBC on 2/13.  No active bleeding reported.  Goal of Therapy:  Heparin level 0.3-0.7 units/ml Monitor platelets by anticoagulation protocol: Yes   Plan:   Give heparin 3000 units bolus IV x 1  Start heparin IV infusion at 1100 units/hr  Heparin level 6 hours after starting  Daily heparin level and CBC  Continue to monitor H&H and platelets  Gretta Arab PharmD, BCPS Pager [phone] 11/14/2013 8:46 AM

## 2013-11-14 NOTE — Progress Notes (Signed)
Peripherally Inserted Central Catheter/Midline Placement  The IV Nurse has discussed with the patient and/or persons authorized to consent for the patient, the purpose of this procedure and the potential benefits and risks involved with this procedure.  The benefits include less needle sticks, lab draws from the catheter and patient may be discharged home with the catheter.  Risks include, but not limited to, infection, bleeding, blood clot (thrombus formation), and puncture of an artery; nerve damage and irregular heat beat.  Alternatives to this procedure were also discussed.  PICC/Midline Placement Documentation  PICC Triple Lumen 69/62/95 PICC Right Basilic 46 cm 5 cm (Active)  Indication for Insertion or Continuance of Line Poor Vasculature-patient has had multiple peripheral attempts or PIVs lasting less than 24 hours 11/14/2013 12:00 PM  Exposed Catheter (cm) 5 cm 11/14/2013 12:00 PM  Dressing Change Due 11/21/13 11/14/2013 12:00 PM       Jule Economy Horton 11/14/2013, 12:49 PM

## 2013-11-14 NOTE — Progress Notes (Deleted)
[id number] Rosana Hoes, RN, BSN, CCM, [phone] Chart reviewed for update of needs and condition.

## 2013-11-14 NOTE — Progress Notes (Signed)
  Echocardiogram 2D Echocardiogram has been performed.  Nosson Wender, [place] 11/14/2013, 10:59 AM

## 2013-11-14 NOTE — Progress Notes (Signed)
CARE MANAGEMENT NOTE 11/14/2013  Patient:  Lacey [name], Lacey [name]   Account Number:  [id number]  Date Initiated:  11/09/2013  Documentation initiated by:  Gabriel Earing  Subjective/Objective Assessment:   pt admitted with cco abd pain, N, V     Action/Plan:   from home   Anticipated DC Date:  11/17/2013   Anticipated DC Plan:  HOME/SELF CARE         Choice offered to / List presented to:             Status of service:  In process, will continue to follow Medicare Important Message given?  NO (If response is "NO", the following Medicare IM given date fields will be blank) Date Medicare IM given:   Date Additional Medicare IM given:    Discharge Disposition:    Per UR Regulation:  Reviewed for med. necessity/level of care/duration of stay  If discussed at [place] of Stay Meetings, dates discussed:   11/14/2013    Comments:  [id number] Rosana Hoes, RN, BSN, [place], [phone] Chart reviewed for update of needs and condition. patient transferred to [facility]du pm of [id number] due to hyperthermia, sepsis.  11/09/13 MMcGibboney, RN, BSN Chart reviewed.

## 2013-11-14 NOTE — Consult Note (Addendum)
[facility]  Total days of antibiotics 8        Day 7 vancomycin        Day 2 acyclovir        Day 2 imipenem        Day 2 voriconazole       Reason for Consult: fever of unknown origin   Referring Physician: woods  Principal Problem:   Pyelonephritis Active Problems:   Hodgkin lymphoma   Myelodysplasia   Adrenal abnormality   Rash and nonspecific skin eruption   Nasal swelling   Acute bronchitis   Chest tightness   DOE (dyspnea on exertion)   Symptomatic anemia   Melena   Adrenal infarction   Hypotension, unspecified    HPI: Lacey [name] is a 58 y.o. 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 She had just returned from an 8 day [place] on 2[place]7 when she called her oncologist office on 2/9  for 1 wk hx of flu like symptoms/bronchitis. She reports that she had felt poorly prior to going onto the cruise with malaise, and intermittent productive cough with associated shortness of breath. She was seen in the Ed on 2/10, where she reports having n/v/d/abd pain/chest pain/fevers/SOB. GI symptoms since 1/28 with n/v/abd pain in RLQ. Decrease po intake x 2 days. Chest pain pressure related to SOB on exertion. On admit she was found to have mildly elevated wbc to 11.4 no left shift, febrile to 101 on 2nd day of hospitalization, then afebrile up until last 2 days starting 2/15 up to 103.8 yesterday. Leukocytosis worsening to 21. 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 She was initial started on cipro, aztreo, vancomycin for 4 days then changed to vanco monotherapy, then now  on imipenem, voriconazole plus acyclovir today due to recurrent fevers. Acyclovir added for hsv oral lesions. Infectious work up is negative thus far (blood cx, urine cx, RVP PCR ) . Recently found to have Superficial thrombus in right arm. 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 She states that she feels significantly better. Does have productive cough this morning, now improved  Past Medical History  Diagnosis Date  . Cancer   . Cough productive of clear sputum 01/04/2013  . Fibromyalgia     Allergies:  Allergies  Allergen Reactions  . Penicillins Hives and Rash    MEDICATIONS: . acyclovir  5 mg/kg (Ideal) Intravenous Q12H  . dextromethorphan-guaiFENesin  1 tablet Oral q morning - 10a  . DULoxetine  60 mg Oral QPM  . fluticasone  2 spray Each Nare Daily  . gabapentin  600 mg Oral BID  . hydrocortisone sodium succinate  100 mg Intravenous Q8H  . imipenem-cilastatin  250 mg Intravenous 4 times per day  . ipratropium-albuterol  3 mL Nebulization QID  . magic mouthwash w/lidocaine  10 mL Oral Q4H  . sodium chloride  3 mL Intravenous Q12H  . [START ON 11/15/2013] vancomycin  1,250 mg Intravenous Q24H  . vancomycin  2,000 mg Intravenous Once  . voriconazole (VFEND - PT WT >  85 kg) IV  4 mg/kg Intravenous Q12H    History  Substance Use Topics  . Smoking status: Never Smoker   . Smokeless tobacco: Never Used  . Alcohol Use: Yes     Comment: occasionally    Family History  Problem Relation Age of Onset  . Cancer Mother   . Cancer Father     Review of Systems  Constitutional: Negative for fever, chills, diaphoresis, activity change, appetite change, fatigue and unexpected weight change.  HENT: Negative for congestion, sore throat, rhinorrhea, sneezing, trouble swallowing and sinus pressure.  Eyes: Negative for photophobia and visual disturbance.  Respiratory: productivefor cough, chest tightness, shortness  of breath, wheezing and stridor.  Cardiovascular: Negative for chest pain, palpitations and leg swelling.  Gastrointestinal: Negative for nausea, vomiting, abdominal pain, diarrhea, constipation, blood in stool, abdominal distention and anal bleeding.  Genitourinary: Negative for dysuria, hematuria, flank pain and difficulty urinating.  Musculoskeletal: Negative for myalgias, back pain, joint swelling, arthralgias and gait problem.  Skin: Negative for color change, pallor, rash and wound.  Neurological: Negative for dizziness, tremors, weakness and light-headedness.  Hematological: Negative for adenopathy. Does not bruise/bleed easily.  Psychiatric/Behavioral: Negative for behavioral problems, confusion, sleep disturbance, dysphoric mood, decreased concentration and agitation.     OBJECTIVE: Temp:  [98.1 F (36.7 C)-103.8 F (39.9 C)] 99.3 F (37.4 C) (02/17 0645) Pulse Rate:  [63-126] 114 (02/17 0645) Resp:  [13-20] 20 (02/17 0645) BP: (55-165)/(22-82) 74/36 mmHg (02/17 0100) SpO2:  [86 %-100 %] 100 % (02/17 0746) Arterial Line BP: (82)/(39) 82/39 mmHg (02/17 0115) Weight:  [212 lb 15.4 oz (96.6 kg)] 212 lb 15.4 oz (96.6 kg) (02/17 0500) Physical Exam  Constitutional:  oriented to person, place, and time. appears well-developed and well-nourished. No distress.  HENT:  Mouth/Throat: Oropharynx is clear and moist. No oropharyngeal exudate. Numerous lesions on oral mucosa of lower lip Cardiovascular: Normal rate, regular rhythm and normal heart sounds. Exam reveals no gallop and no friction rub.  No murmur heard.  Pulmonary/Chest: Effort normal and breath sounds normal. No respiratory distress.  has no wheezes.  Abdominal: Soft. Bowel sounds are normal.  exhibits no distension. There is no tenderness.  Lymphadenopathy: no cervical adenopathy.  Neurological: alert and oriented to person, place, and time.  Skin: Skin is warm and dry. No rash noted. No erythema.  Psychiatric: a normal  mood and affect. behavior is normal.  Ext = right arm swelling and erythema to antecubital fossa with associated edema. Left arm picc line.   LABS: Results for orders placed during the hospital encounter of 11/08/2013 (from the past 48 hour(s))  CBC WITH DIFFERENTIAL     Status: Abnormal   Collection Time    11/13/13  4:45 AM      Result Value Ref Range   WBC 21.5 (*) 4.0 - 10.5 K/uL   RBC 3.24 (*) 3.87 - 5.11 MIL/uL   Hemoglobin 10.1 (*) 12.0 - 15.0 g/dL   HCT 30.4 (*) 36.0 - 46.0 %   MCV 93.8  78.0 - 100.0 fL   MCH 31.2  26.0 - 34.0 pg   MCHC 33.2  30.0 - 36.0 g/dL   RDW 20.0 (*) 11.5 - 15.5 %   Platelets 63 (*) 150 - 400 K/uL   Comment: PLATELET COUNT CONFIRMED BY SMEAR     LARGE PLATELETS PRESENT   Neutrophils Relative % 78 (*) 43 - 77 %   Lymphocytes Relative 14  12 - 46 %   Monocytes Relative 5  3 - 12 %   Eosinophils Relative 1  0 - 5 %   Basophils Relative 2 (*) 0 - 1 %   Neutro Abs 16.8 (*) 1.7 - 7.7 K/uL   Lymphs Abs 3.0  0.7 - 4.0 K/uL   Monocytes Absolute 1.1 (*) 0.1 - 1.0 K/uL   Eosinophils Absolute 0.2  0.0 - 0.7 K/uL   Basophils Absolute 0.4 (*) 0.0 - 0.1 K/uL   RBC Morphology RARE NRBCs     WBC Morphology       Value: MARKED LEFT SHIFT (>5% METAS,MYELOS AND PROS, OCC BLAST NOTED)   Comment: DOHLE BODIES  COMPREHENSIVE METABOLIC PANEL     Status: Abnormal   Collection Time    11/13/13  4:45 AM      Result Value Ref Range   Sodium 139  137 - 147 mEq/L   Potassium 3.3 (*) 3.7 - 5.3 mEq/L   Chloride 100  96 - 112 mEq/L   CO2 26  19 - 32 mEq/L   Glucose, Bld 126 (*) 70 - 99 mg/dL   BUN 12  6 - 23 mg/dL   Creatinine, Ser 1.06  0.50 - 1.10 mg/dL   Calcium 8.3 (*) 8.4 - 10.5 mg/dL   Total Protein 6.9  6.0 - 8.3 g/dL   Albumin 2.3 (*) 3.5 - 5.2 g/dL   AST 16  0 - 37 U/L   ALT 17  0 - 35 U/L   Alkaline Phosphatase 70  39 - 117 U/L   Total Bilirubin 0.6  0.3 - 1.2 mg/dL   GFR calc non Af Amer 56 (*) >90 mL/min   GFR calc Af Amer 65 (*) >90 mL/min   Comment:  (NOTE)     The eGFR has been calculated using the CKD EPI equation.     This calculation has not been validated in all clinical situations.     eGFR's persistently <90 mL/min signify possible Chronic Kidney     Disease.  MAGNESIUM     Status: None   Collection Time    11/13/13  4:45 AM      Result Value Ref Range   Magnesium 1.7  1.5 - 2.5 mg/dL  RETICULOCYTES     Status: Abnormal   Collection Time    11/13/13  4:45 AM      Result Value Ref Range   Retic Ct Pct 0.8  0.4 - 3.1 %   RBC. 3.24 (*) 3.87 - 5.11 MIL/uL   Retic Count, Manual 25.9  19.0 - 186.0 K/uL  LACTATE DEHYDROGENASE     Status: Abnormal   Collection Time    11/13/13  4:45 AM      Result Value Ref Range   LDH 644 (*) 94 - 250 U/L   Comment: NO VISIBLE HEMOLYSIS  CULTURE, BLOOD (ROUTINE X 2)     Status: None   Collection Time    11/13/13  6:20 AM      Result Value Ref Range   Specimen Description BLOOD LEFT HAND     Special Requests       Value: BOTTLES DRAWN AEROBIC AND ANAEROBIC [place] [facility] 3CC ANA   Culture  Setup Time       Value: 11/13/2013 08:35     Performed at [facility]   Culture       Value:        BLOOD CULTURE RECEIVED NO GROWTH TO DATE CULTURE WILL BE HELD FOR 5 DAYS BEFORE ISSUING A FINAL  NEGATIVE REPORT     Performed at [facility]   Report Status PENDING    CULTURE, BLOOD (ROUTINE X 2)     Status: None   Collection Time    11/13/13  6:35 AM      Result Value Ref Range   Specimen Description BLOOD LEFT HAND     Special Requests       Value: BOTTLES DRAWN AEROBIC AND ANAEROBIC [place] AER 3CC ANA   Culture  Setup Time       Value: 11/13/2013 08:35     Performed at [facility]   Culture       Value:        BLOOD CULTURE RECEIVED NO GROWTH TO DATE CULTURE WILL BE HELD FOR 5 DAYS BEFORE ISSUING A FINAL NEGATIVE REPORT     Performed at [facility]   Report Status PENDING    MRSA PCR SCREENING     Status: None   Collection Time    11/13/13  6:04 PM      Result  Value Ref Range   MRSA by PCR NEGATIVE  NEGATIVE   Comment:            The GeneXpert MRSA Assay (FDA     approved for NASAL specimens     only), is one component of a     comprehensive MRSA colonization     surveillance program. It is not     intended to diagnose MRSA     infection nor to guide or     monitor treatment for     MRSA infections.  CORTISOL-PM, BLOOD     Status: None   Collection Time    11/13/13  6:45 PM      Result Value Ref Range   Cortisol - PM 4.1  3.1 - 16.7 ug/dL   Comment: Performed at [facility]     Status: Abnormal   Collection Time    11/13/13  8:55 PM      Result Value Ref Range   Color, Urine YELLOW  YELLOW   APPearance CLEAR  CLEAR   Specific Gravity, Urine 1.039 (*) 1.005 - 1.030   pH 6.0  5.0 - 8.0   Glucose, UA NEGATIVE  NEGATIVE mg/dL   Hgb urine dipstick NEGATIVE  NEGATIVE   Bilirubin Urine NEGATIVE  NEGATIVE   Ketones, ur NEGATIVE  NEGATIVE mg/dL   Protein, ur 30 (*) NEGATIVE mg/dL   Urobilinogen, UA 0.2  0.0 - 1.0 mg/dL   Nitrite NEGATIVE  NEGATIVE   Leukocytes, UA NEGATIVE  NEGATIVE  URINE MICROSCOPIC-ADD ON     Status: Abnormal   Collection Time    11/13/13  8:55 PM      Result Value Ref Range   Squamous Epithelial / LPF RARE  RARE   WBC, UA 0-2  <3 WBC/hpf   Casts HYALINE CASTS (*) NEGATIVE  SEDIMENTATION RATE     Status: Abnormal   Collection Time    11/13/13  9:22 PM      Result Value Ref Range   Sed Rate 131 (*) 0 - 22 mm/hr  PROCALCITONIN     Status: None   Collection Time    11/13/13 11:13 PM      Result Value Ref Range   Procalcitonin >175.00     Comment:            Interpretation:     PCT >= 10 ng/mL:  Important systemic inflammatory response,     almost exclusively due to severe bacterial     sepsis or septic shock.     (NOTE)             ICU PCT Algorithm               Non ICU PCT Algorithm        ----------------------------      ------------------------------             PCT < 0.25 ng/mL                 PCT < 0.1 ng/mL         Stopping of antibiotics            Stopping of antibiotics           strongly encouraged.               strongly encouraged.        ----------------------------     ------------------------------           PCT level decrease by               PCT < 0.25 ng/mL           >= 80% from peak PCT           OR PCT 0.25 - 0.5 ng/mL          Stopping of antibiotics                                                 encouraged.         Stopping of antibiotics               encouraged.        ----------------------------     ------------------------------           PCT level decrease by              PCT >= 0.25 ng/mL           < 80% from peak PCT            AND PCT >= 0.5 ng/mL            Continuing antibiotics                                                  encouraged.           Continuing antibiotics                encouraged.        ----------------------------     ------------------------------         PCT level increase compared          PCT > 0.5 ng/mL             with peak PCT AND              PCT >= 0.5 ng/mL             Escalation of antibiotics  strongly encouraged.          Escalation of antibiotics            strongly encouraged.  BASIC METABOLIC PANEL     Status: Abnormal   Collection Time    11/13/13 11:13 PM      Result Value Ref Range   Sodium 139  137 - 147 mEq/L   Potassium 3.2 (*) 3.7 - 5.3 mEq/L   Chloride 104  96 - 112 mEq/L   CO2 24  19 - 32 mEq/L   Glucose, Bld 87  70 - 99 mg/dL   BUN 16  6 - 23 mg/dL   Creatinine, Ser 2.10 (*) 0.50 - 1.10 mg/dL   Comment: DELTA CHECK NOTED   Calcium 7.8 (*) 8.4 - 10.5 mg/dL   GFR calc non Af Amer 25 (*) >90 mL/min   GFR calc Af Amer 29 (*) >90 mL/min   Comment: (NOTE)     The eGFR has been calculated using the CKD EPI equation.     This calculation has not been validated in all clinical  situations.     eGFR's persistently <90 mL/min signify possible Chronic Kidney     Disease.  MAGNESIUM     Status: None   Collection Time    11/13/13 11:13 PM      Result Value Ref Range   Magnesium 1.6  1.5 - 2.5 mg/dL  PHOSPHORUS     Status: None   Collection Time    11/13/13 11:13 PM      Result Value Ref Range   Phosphorus 4.0  2.3 - 4.6 mg/dL  LACTIC ACID, PLASMA     Status: None   Collection Time    11/13/13 11:15 PM      Result Value Ref Range   Lactic Acid, Venous 1.2  0.5 - 2.2 mmol/L  CBC WITH DIFFERENTIAL     Status: Abnormal   Collection Time    11/14/13  4:35 AM      Result Value Ref Range   WBC 21.0 (*) 4.0 - 10.5 K/uL   Comment: WHITE COUNT CONFIRMED ON SMEAR   RBC 2.79 (*) 3.87 - 5.11 MIL/uL   Hemoglobin 8.5 (*) 12.0 - 15.0 g/dL   HCT 27.0 (*) 36.0 - 46.0 %   MCV 96.8  78.0 - 100.0 fL   MCH 30.5  26.0 - 34.0 pg   MCHC 31.5  30.0 - 36.0 g/dL   RDW 20.3 (*) 11.5 - 15.5 %   Platelets    150 - 400 K/uL   Value: PLATELET CLUMPS NOTED ON SMEAR, COUNT APPEARS DECREASED   Comment: REPEATED TO VERIFY     SPECIMEN CHECKED FOR CLOTS   Neutrophils Relative % 83 (*) 43 - 77 %   Lymphocytes Relative 7 (*) 12 - 46 %   Monocytes Relative 8  3 - 12 %   Eosinophils Relative 1  0 - 5 %   Basophils Relative 1  0 - 1 %   Neutro Abs 17.4 (*) 1.7 - 7.7 K/uL   Lymphs Abs 1.5  0.7 - 4.0 K/uL   Monocytes Absolute 1.7 (*) 0.1 - 1.0 K/uL   Eosinophils Absolute 0.2  0.0 - 0.7 K/uL   Basophils Absolute 0.2 (*) 0.0 - 0.1 K/uL   RBC Morphology RARE NRBCs     WBC Morphology       Value: MARKED LEFT SHIFT (>5% METAS,MYELOS AND PROS, OCC BLAST NOTED)  COMPREHENSIVE METABOLIC PANEL  Status: Abnormal   Collection Time    11/14/13  4:35 AM      Result Value Ref Range   Sodium 139  137 - 147 mEq/L   Potassium 3.9  3.7 - 5.3 mEq/L   Comment: NO VISIBLE HEMOLYSIS     DELTA CHECK NOTED   Chloride 107  96 - 112 mEq/L   CO2 22  19 - 32 mEq/L   Glucose, Bld 95  70 - 99 mg/dL   BUN 18   6 - 23 mg/dL   Creatinine, Ser 1.90 (*) 0.50 - 1.10 mg/dL   Calcium 7.1 (*) 8.4 - 10.5 mg/dL   Total Protein 5.6 (*) 6.0 - 8.3 g/dL   Albumin 1.7 (*) 3.5 - 5.2 g/dL   AST 34  0 - 37 U/L   ALT 20  0 - 35 U/L   Alkaline Phosphatase 64  39 - 117 U/L   Total Bilirubin 0.3  0.3 - 1.2 mg/dL   GFR calc non Af Amer 28 (*) >90 mL/min   GFR calc Af Amer 32 (*) >90 mL/min   Comment: (NOTE)     The eGFR has been calculated using the CKD EPI equation.     This calculation has not been validated in all clinical situations.     eGFR's persistently <90 mL/min signify possible Chronic Kidney     Disease.  MAGNESIUM     Status: None   Collection Time    11/14/13  4:35 AM      Result Value Ref Range   Magnesium 1.6  1.5 - 2.5 mg/dL  RETICULOCYTES     Status: Abnormal   Collection Time    11/14/13  4:35 AM      Result Value Ref Range   Retic Ct Pct 0.7  0.4 - 3.1 %   RBC. 2.79 (*) 3.87 - 5.11 MIL/uL   Retic Count, Manual 19.5  19.0 - 186.0 K/uL  LACTATE DEHYDROGENASE     Status: Abnormal   Collection Time    11/14/13  4:35 AM      Result Value Ref Range   LDH 510 (*) 94 - 250 U/L   Comment: NO VISIBLE HEMOLYSIS  PROCALCITONIN     Status: None   Collection Time    11/14/13  4:35 AM      Result Value Ref Range   Procalcitonin >175.00     Comment:            Interpretation:     PCT >= 10 ng/mL:     Important systemic inflammatory response,     almost exclusively due to severe bacterial     sepsis or septic shock.     (NOTE)             ICU PCT Algorithm               Non ICU PCT Algorithm        ----------------------------     ------------------------------             PCT < 0.25 ng/mL                 PCT < 0.1 ng/mL         Stopping of antibiotics            Stopping of antibiotics           strongly encouraged.               strongly encouraged.        ----------------------------     ------------------------------  PCT level decrease by               PCT < 0.25 ng/mL            >= 80% from peak PCT           OR PCT 0.25 - 0.5 ng/mL          Stopping of antibiotics                                                 encouraged.         Stopping of antibiotics               encouraged.        ----------------------------     ------------------------------           PCT level decrease by              PCT >= 0.25 ng/mL           < 80% from peak PCT            AND PCT >= 0.5 ng/mL            Continuing antibiotics                                                  encouraged.           Continuing antibiotics                encouraged.        ----------------------------     ------------------------------         PCT level increase compared          PCT > 0.5 ng/mL             with peak PCT AND              PCT >= 0.5 ng/mL             Escalation of antibiotics                                              strongly encouraged.          Escalation of antibiotics            strongly encouraged.  SAVE SMEAR     Status: None   Collection Time    11/14/13  4:35 AM      Result Value Ref Range   Smear Review SMEAR STAINED AND AVAILABLE FOR REVIEW    PRO B NATRIURETIC PEPTIDE     Status: Abnormal   Collection Time    11/14/13  4:35 AM      Result Value Ref Range   Pro B Natriuretic peptide (BNP) 2977.0 (*) 0 - 125 pg/mL  PLATELET COUNT     Status: Abnormal   Collection Time    11/14/13  8:40 AM      Result Value Ref Range   Platelets 46 (*) 150 - 400 K/uL   Comment: PLATELET COUNT PERFORMED ON CITRATED BLOOD  TROPONIN I  Status: None   Collection Time    11/14/13 10:40 AM      Result Value Ref Range   Troponin I <0.30  <0.30 ng/mL   Comment:            Due to the release kinetics of cTnI,     a negative result within the first hours     of the onset of symptoms does not rule out     myocardial infarction with certainty.     If myocardial infarction is still suspected,     repeat the test at appropriate intervals.    MICRO: 2/11 blood cx ngtd 2/16 blood  cx ngtd 2/16 urine cx ngtd IMAGING: Ct Abdomen W Contrast  11/13/2013   CLINICAL DATA:  Left upper quadrant pain, leukemia, evaluate for splenic infarct.  EXAM: CT ABDOMEN WITH CONTRAST  TECHNIQUE: Multidetector CT imaging of the abdomen was performed using the standard protocol following bolus administration of intravenous contrast.  CONTRAST:  140mL OMNIPAQUE IOHEXOL 300 MG/ML  SOLN  COMPARISON:  Abdominal CT from 6 days  FINDINGS: BODY WALL: Unremarkable.  LOWER CHEST: There are new small bilateral pleural effusions with passive atelectasis that is mild. The effusions appears simple in water density.  ABDOMEN/PELVIS:  Liver: There are numerous bilateral low dense renal masses which appears circumscribed water density, consistent with cysts.  Biliary: Gallbladder is distended without inflammatory changes. No biliary ductal dilatation.  Pancreas: Unremarkable.  Spleen: Unremarkable.  Adrenals: The right adrenal gland remains enlarged the and indistinct. There is a hypervascular or high-density focus in the anterior limb on the right - which may represent residual functioning gland. This area fades on delayed imaging. Previous imaging showed no evidence of leukemic infiltration on the left. The left adrenal gland is newly enlarged and irregular, with pre renal retroperitoneal infiltration. There is no evidence of active hemorrhage. The renal veins are patent.  Kidneys and ureters: No hydronephrosis or stone.  Bowel: No obstruction. Normal appendix.  OSSEOUS: No acute abnormalities.  These results were called by telephone at the time of interpretation on 11/13/2013 at 1:16 PM to Dr. Burney Gauze , who verbally acknowledged these results.  IMPRESSION: 1. Bilateral adrenal gland abnormality, new on the left since 11/16/2013. 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 2. New small bilateral pleural effusions.   Electronically Signed   By: Jorje Guild M.D.   On: 11/13/2013 13:19   Dg Chest Port 1 View  11/14/2013    CLINICAL DATA:  Fever, cough  EXAM: PORTABLE CHEST - 1 VIEW  COMPARISON:  Portable exam 0738 hr compared to 11/13/2013  FINDINGS: Upper normal heart size.  Again identified prominence of the right hilum question due to a combination of prominent perihilar vascular structures and loculated fluid within the minor fissure.  Component of persistent right perihilar infiltrate versus scarring in right upper lobe unchanged.  Pleural effusion blunts the right lateral costophrenic angle.  Left lung clear.  No pneumothorax.  Prior cervical spine fusion.  IMPRESSION: Persistent right upper lobe infiltrate versus scarring in perihilar region.  Right pleural effusion likely partially loculated in minor fissure.   Electronically Signed   By: Lavonia Dana M.D.   On: 11/14/2013 08:11   Dg Chest Port 1 View  11/13/2013   CLINICAL DATA:  Fever.  EXAM: PORTABLE CHEST - 1 VIEW  COMPARISON:  11/11/2013 in 01/04/2013  FINDINGS: Lungs are hypoinflated with chronic changes in the right suprahilar region. There is worsening opacification over the lateral right perihilar region. Cardiomediastinal  silhouette and remainder of the exam is unchanged.  IMPRESSION: Worsening right perihilar opacification which may be due to atelectasis or infection. Chronic changes over the right suprahilar region.   Electronically Signed   By: Marin Olp M.D.   On: 11/13/2013 08:06    Assessment/Plan:  58 y.o. 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  Possible hcap = recent cxr suggestive more of fluid overload, possible infiltrate vs. Scarring. Would finish vancomycin and imipenem as last dose today to finish treatment for hcap. Recommend to get chest CT.  Intermittent fevers =could be due to adrenal insufficiency. Not completely  clear that it is not infection. Work up has been negative, but most recent cultures were done while on antibiotics. Will get fungal blood cx.   Rash = resolved  Mouth ulcer = will get hsv swab, continue with acyclovir  Empiric antifungals = would discontinue voriconazole for right now since we don't necessarily know what we are treating. Patient is stable, and she is not technically neutropenic. Candidal species are more likely to be the risk. Would stop voriconazole for now. Will get noncontrast chest CT today, to see if pulmonary disease concerning for IFI.  If she still remains febrile or hemodynamically unstable, would start micafungin. Will check fungitell assay to see if elevated as sign of invasive fungal infection. Will check aspergillus antigen.  Elzie Rings [facility] [phone]

## 2013-11-14 NOTE — Progress Notes (Signed)
If discharged with PICC line, please order exchange so that patient may not be sent home with triple lumen PICC.

## 2013-11-14 NOTE — Progress Notes (Signed)
Pt was hypotensive thru the night, received  a total of 5.5L of NS, with SBP still in the 80s.made a total of 5100ml of urine. A-line was place for better BP monitoring.

## 2013-11-14 NOTE — Progress Notes (Signed)
[facility] PROGRESS NOTE  Lacey [name] EXB:[id number] DOB: [birth year] DOA: 11/02/2013 PCP: Vena Austria, MD  Assessment/Plan:  Pyelonephritis  -Continue aggressive hydration -CT scan of the abdomen is suggestive of possible renal stranding in the right upper pole as well as inflammation versus hemorrhage in adrenal on right side -Continue IV Cipro and IV aztreonam for dual Pseudomonas coverage. -Blood cultures negative,  -2/16 Urine culture pending    Symptomatically anemia,  -Secondary melena and myelodysplastic syndrome  -Hx melena one week ago resolved; negative Hemoccult. Her hemoglobin has trended downwards 2/12 hemoglobin= (7.2), overnight patient hemoglobin dropped to 6.6 -Continue to monitor -Ambulatory SpO2; patient ambulating 400 feet on room air HR= 123, SpO2= 92% on RA  SOB  -symptomatic myelodysplastic syndrome (chest tightness and dyspnea); S/P transfused one unit PRBC  -Hemoccult x 1 negative  -Improved after 2 unit PRBC on 2/13   Skin eruptions  -Resolving. Etiology unclear however if infectious should be covered by broad-spectrum antibiotic -Resolved   Bronchitis  -Continue antibiotics, Mucinex.  -Headache blurred vision and nasal bridge swelling, resolved with initial antibiotic course. -2/16 patient had increasing work of breathing. Exam revealed increasing rhonchi  -Respiratory Virus Panel; negative -2/17 Obtain induced sputum for culture  Anemia symptomatic -2 unit PRBC per Dr. 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 Patient also exhibiting signs and symptoms of adrenal insufficiency refractory N/V, , anorexia, fever, weakness. - Transfer patient to step down unit -stat ACTH. Pending - Random p.m. Cortisol WNL - Start patient on hydrocortisone (Solu-Cortef) 100 mg IV q 8  hour  Hypotension -Overnight patient became hypotensive infection vs primary adrenal insufficiency. Currently maintaining map slightly> 60 with significant fluid resuscitation -PICC line being placed obtain CVP to ensure no iatrogenic CHF secondary to fluid overload  -ProBNP= 2977 obtain stat EKG, echocardiogram, troponin x3  Diastolic CHF -No previous proBNP for comparison, however patient's high proBNP and refractory hypotension must consider worsening CHF vs acute MI. -With patient's leukocytosis + left shift while on maximum antibiotic treatment must also consider infective endocarditis. -Consult infectious disease -After we obtain troponin x3, echocardiogram consider cardiology consult  Leukocytosis -Patient with continued high WBC and left shift on the known source of infection -Would recommend stop all antibiotics for least 48 hours to obtain new blood culture, urine culture, sputum in order to determine best treatment regimen -Have consulted infectious disease for recommendations    Consultants: Dr. Burney Gauze (oncology) Dr Gibson Ramp (Surgery) curbside consult Dr. Carlyle Basques (infectious disease)   Procedures: CT abdomen pelvis with contrast 11/13/2013 1. Bilateral adrenal gland abnormality, new on the left since  11/11/2013. 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  2. New small bilateral pleural effusions.   11/12/2013 right upper extremity Doppler No evidence of deep vein thrombosis involving the right upper extremity, left subclavian vein, and left internal jugular vein. There is superficial thrombosis noted in the right cephalic vein.   CT maxillofacial with contrast to/14/2015 Asymmetric fullness and homogeneous enhancement of the right  cavernous sinus as compared to the left. While the appearance is not  concerning for invasive fungal sinusitis, I could not exclude an  early leukemic infiltrate.  Chronic sinus fluid accumulation appears similar to  prior study on  11/08/2013. There is increasing mucosal edema and nasal turbinate  swelling on the left as compared with the previous study, but bony  destruction is not established. Correlate  clinically. If the patient  develops severe headache or cranial nerve findings, MRI of the brain  without and with contrast recommended for further evaluation.   -2/12 Bone marrow biopsy report pending  Echocardiogram 01/10/2013 - Left ventricle: mild focal basal hypertrophy of the septum.  -LVEF= ejection fraction was 55%. Wall motion was normal; there were no regional wall motion abnormalities.  -(grade 1 diastolic dysfunction).  - Aortic valve: There was no stenosis. Trivial regurgitation. - Mitral valve: Trivial regurgitation. - Left atrium: The atrium was mildly dilated. - Right ventricle: The cavity size was normal. Systolic function was normal. - Tricuspid valve: Peak RV-RA gradient: 58mm Hg (S). - Pulmonary arteries: PA peak pressure: 31mm Hg (S). - Inferior vena cava: The vessel was normal in size; the respirophasic diameter changes were in the normal range (=50%); findings are consistent with normal central venous pressure.      Antibiotics: Aztreonam 2/11>> stopped 2/13 Ciprofloxacin 2/11>> stopped 2/15 Vancomycin 2/11>> stopped 2/15 Primaxin>> 2/16>> Voriconazole 2/16>> stopped 2/16 Vancomycin 2/18>>      Code Status: Full Family Communication: Family at bedside for discussion of plan of care Disposition Plan: Resolution pyelonephritis      HPI/Subjective: Lacey [name] is a 58 y.o. WF PMHx Hodgkin's lymphoma status post chemotherapy in the past has myelodysplastic syndrome. The patient is coming from home. Patient presents with complaints of nausea vomiting and right-sided abdominal pain. The patient mentions that her symptoms started a month ago with cough and yellowish expectoration for which she completed a course of amoxicillin despite which her cough was not  getting better.since last one month she started having dyspnea on exertion which is progressively worsening.denies any orthopnea or PND.  Since last one week she started having complaints of chest tightness on exertion along with dyspnea which resolved on rest. She is at present chest pain-free. She was on a cruise to [place] last week and off for going on cruise she started having episodes of black color bowel movements which are present on last Tuesday without any abdominal pain or nausea and resolved. She continues to have diarrhea then on and off but no melena or active bleeding. She got some medication and cruise.  Since Saturday started having complaints of pain in her right lower abdomen which was sharp and was associated with nausea and was to 6 episodes of vomiting today. She has poor appetite and has not had any today. She's taking her medications at home and there is no change in her medication and last 1 month. She denies any burning urination but she did have a fever of 102.90F yesterday.  She did have some bleeding from her nose on and off since last one month associated with redness and swelling of her nasal bridge with some blurring of vision which is present since last one week. She denies any focal neurological deficit she has chronic orthostatic dizziness which has not changed. 2/12 states decreased facial pain, decreased SOB, continued DOE. 12/13 continued productive cough, increased fatigue. Negative N./V./D. patient receiving 2 unit PRBC per Dr. Burney Gauze (oncology) 2/14 patient has been up ambulating within room improved cough, decreased DOE 2/15 patient states feeling improved decreased fatigue, negative DOE. 2/16 abdominal CT shows left adrenal gland infarction unknown cause. Patient was signs of symptoms of adrenal insufficiency transferred to step down unit and start hydrocortisone 100 mg q 8hr 2/17 overnight patient had difficulty maintaining her pressures was fluid  resuscitated 5.5 L normal saline, and an arterial line was placed  to improve monitoring of her BP. Patient's renal function also deteriorated with her creatinine reaching a peak of 2.10, but improved with the fluid resuscitation. A.m. Creatinine= 1.90.      Objective: Filed Vitals:   11/14/13 0600 11/14/13 0615 11/14/13 0630 11/14/13 0645  BP:      Pulse: 109 109 111 114  Temp: 99.3 F (37.4 C) 99.3 F (37.4 C) 99.3 F (37.4 C) 99.3 F (37.4 C)  TempSrc:      Resp: $Remo'15 15 16 20  'KFwJE$ Height:      Weight:      SpO2: 97% 97% 99% 98%    Intake/Output Summary (Last 24 hours) at 11/14/13 0729 Last data filed at 11/14/13 0600  Gross per 24 hour  Intake   7176 ml  Output    530 ml  Net   6646 ml   Filed Weights   11/11/13 0700 11/13/13 0452 11/14/13 0500  Weight: 90.2 kg (198 lb 13.7 oz) 89.3 kg (196 lb 13.9 oz) 96.6 kg (212 lb 15.4 oz)    Exam:   General: A./O. x4, fatigue, NAD  Cardiovascular: Tachycardic, regular rhythm, negative murmurs rubs gallop  Respiratory: Wheezing Rt> Lt. Rhonchi RUL/RML/RLL    Abdomen: Positive LLQ tenderness/Lt CVA tenderness, positive mild RLQ tenderness, negative right CVA tenderness  Musculoskeletal: Negative pedal edema, swollen/erythematous right elbow/arm. Mild tenderness to touch   Data Reviewed: Basic Metabolic Panel:  Recent Labs Lab 11/11/13 0507 11/12/13 0505 11/13/13 0445 11/13/13 2313 11/14/13 0435  NA 141 139 139 139 139  K 3.7 3.5* 3.3* 3.2* 3.9  CL 106 101 100 104 107  CO2 $Re'23 25 26 24 22  'mSL$ GLUCOSE 107* 143* 126* 87 95  BUN $Re'20 15 12 16 18  'JuQ$ CREATININE 1.18* 1.12* 1.06 2.10* 1.90*  CALCIUM 8.7 8.5 8.3* 7.8* 7.1*  MG 1.8 1.7 1.7 1.6 1.6  PHOS  --   --   --  4.0  --    Liver Function Tests:  Recent Labs Lab 11/10/13 0449 11/11/13 0507 11/12/13 0505 11/13/13 0445 11/14/13 0435  AST $Re'12 15 15 16 'UjZ$ 34  ALT $Re'15 17 19 17 20  'cwF$ ALKPHOS 65 68 66 70 64  BILITOT 0.2* 0.4 0.5 0.6 0.3  PROT 6.8 7.0 6.8 6.9 5.6*  ALBUMIN 2.5*  2.3* 2.4* 2.3* 1.7*    Recent Labs Lab 11/04/2013 1740  LIPASE 20   No results found for this basename: AMMONIA,  in the last 168 hours CBC:  Recent Labs Lab 11/10/13 0449 11/11/13 0507 11/12/13 0505 11/13/13 0445 11/14/13 0435  WBC 14.9* 17.5* 22.3* 21.5* PENDING  NEUTROABS 11.9* 7.9* 11.8* 16.8* PENDING  HGB 6.6* 10.4* 9.7* 10.1* 8.5*  HCT 21.0* 31.0* 29.4* 30.4* 27.0*  MCV 96.8 93.1 93.9 93.8 96.8  PLT 85* 81* 69* 63* PENDING   Cardiac Enzymes:  Recent Labs Lab 11/15/2013 1740 11/02/2013 2150  TROPONINI <0.30 <0.30   BNP (last 3 results) No results found for this basename: PROBNP,  in the last 8760 hours CBG: No results found for this basename: GLUCAP,  in the last 168 hours  Recent Results (from the past 240 hour(s))  CULTURE, BLOOD (ROUTINE X 2)     Status: None   Collection Time    11/08/13 12:40 AM      Result Value Ref Range Status   Specimen Description BLOOD LEFT ANTECUBITAL   Final   Special Requests BOTTLES DRAWN AEROBIC AND ANAEROBIC 3CC   Final   Culture  Setup Time  Final   Value: 11/08/2013 03:32     Performed at [facility]   Culture     Final   Value:        BLOOD CULTURE RECEIVED NO GROWTH TO DATE CULTURE WILL BE HELD FOR 5 DAYS BEFORE ISSUING A FINAL NEGATIVE REPORT     Performed at [facility]   Report Status PENDING   Incomplete  CULTURE, BLOOD (ROUTINE X 2)     Status: None   Collection Time    11/08/13 12:45 AM      Result Value Ref Range Status   Specimen Description BLOOD LEFT HAND   Final   Special Requests BOTTLES DRAWN AEROBIC AND ANAEROBIC 3CC   Final   Culture  Setup Time     Final   Value: 11/08/2013 03:31     Performed at [facility]   Culture     Final   Value:        BLOOD CULTURE RECEIVED NO GROWTH TO DATE CULTURE WILL BE HELD FOR 5 DAYS BEFORE ISSUING A FINAL NEGATIVE REPORT     Performed at [facility]   Report Status PENDING   Incomplete  RESPIRATORY VIRUS PANEL     Status: None    Collection Time    11/11/13 12:00 AM      Result Value Ref Range Status   Source - RVPAN NASOPHARYNGEAL   Final   Respiratory Syncytial Virus A NOT DETECTED   Final   Respiratory Syncytial Virus B NOT DETECTED   Final   Influenza A NOT DETECTED   Final   Influenza B NOT DETECTED   Final   Parainfluenza 1 NOT DETECTED   Final   Parainfluenza 2 NOT DETECTED   Final   Parainfluenza 3 NOT DETECTED   Final   Metapneumovirus NOT DETECTED   Final   Rhinovirus NOT DETECTED   Final   Adenovirus NOT DETECTED   Final   Influenza A H1 NOT DETECTED   Final   Influenza A H3 NOT DETECTED   Final   Comment: (NOTE)           Normal Reference Range for each Analyte: NOT DETECTED     Testing performed using the Luminex xTAG Respiratory Viral Panel test     kit.     This test was developed and its performance characteristics determined     by [facility]. It has not been cleared or approved by the [place]     Food and Drug Administration. This test is used for clinical purposes.     It should not be regarded as investigational or for research. This     laboratory is certified under the [place] (CLIA) as qualified to perform high complexity     clinical laboratory testing.     Performed at [facility] PCR SCREENING     Status: None   Collection Time    11/13/13  6:04 PM      Result Value Ref Range Status   MRSA by PCR NEGATIVE  NEGATIVE Final   Comment:            The GeneXpert MRSA Assay (FDA     approved for NASAL specimens     only), is one component of a     comprehensive MRSA colonization     surveillance program. It is not     intended to diagnose  MRSA     infection nor to guide or     monitor treatment for     MRSA infections.     Studies: Ct Abdomen W Contrast  11/13/2013   CLINICAL DATA:  Left upper quadrant pain, leukemia, evaluate for splenic infarct.  EXAM: CT ABDOMEN WITH CONTRAST  TECHNIQUE: Multidetector CT  imaging of the abdomen was performed using the standard protocol following bolus administration of intravenous contrast.  CONTRAST:  130mL OMNIPAQUE IOHEXOL 300 MG/ML  SOLN  COMPARISON:  Abdominal CT from 6 days  FINDINGS: BODY WALL: Unremarkable.  LOWER CHEST: There are new small bilateral pleural effusions with passive atelectasis that is mild. The effusions appears simple in water density.  ABDOMEN/PELVIS:  Liver: There are numerous bilateral low dense renal masses which appears circumscribed water density, consistent with cysts.  Biliary: Gallbladder is distended without inflammatory changes. No biliary ductal dilatation.  Pancreas: Unremarkable.  Spleen: Unremarkable.  Adrenals: The right adrenal gland remains enlarged the and indistinct. There is a hypervascular or high-density focus in the anterior limb on the right - which may represent residual functioning gland. This area fades on delayed imaging. Previous imaging showed no evidence of leukemic infiltration on the left. The left adrenal gland is newly enlarged and irregular, with pre renal retroperitoneal infiltration. There is no evidence of active hemorrhage. The renal veins are patent.  Kidneys and ureters: No hydronephrosis or stone.  Bowel: No obstruction. Normal appendix.  OSSEOUS: No acute abnormalities.  These results were called by telephone at the time of interpretation on 11/13/2013 at 1:16 PM to Dr. Burney Gauze , who verbally acknowledged these results.  IMPRESSION: 1. Bilateral adrenal gland abnormality, new on the left since 11/20/2013. 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 2. New small bilateral pleural effusions.   Electronically Signed   By: Jorje Guild M.D.   On: 11/13/2013 13:19   Dg Chest Port 1 View  11/13/2013   CLINICAL DATA:  Fever.  EXAM: PORTABLE CHEST - 1 VIEW  COMPARISON:  11/11/2013 in 01/04/2013  FINDINGS: Lungs are hypoinflated with chronic changes in the right suprahilar region. There is worsening opacification over  the lateral right perihilar region. Cardiomediastinal silhouette and remainder of the exam is unchanged.  IMPRESSION: Worsening right perihilar opacification which may be due to atelectasis or infection. Chronic changes over the right suprahilar region.   Electronically Signed   By: Marin Olp M.D.   On: 11/13/2013 08:06    Scheduled Meds: . dextromethorphan-guaiFENesin  1 tablet Oral q morning - 10a  . DULoxetine  60 mg Oral QPM  . fluticasone  2 spray Each Nare Daily  . gabapentin  600 mg Oral BID  . hydrocortisone sodium succinate  100 mg Intravenous Q8H  . imipenem-cilastatin  500 mg Intravenous Q8H  . ipratropium-albuterol  3 mL Nebulization QID  . sodium chloride  3 mL Intravenous Q12H  . voriconazole (VFEND - PT WT > 85 kg) IV  4 mg/kg Intravenous Q12H   Continuous Infusions: . sodium chloride 50 mL/hr at 11/13/13 0715    Principal Problem:   Pyelonephritis Active Problems:   Hodgkin lymphoma   Myelodysplasia   Adrenal abnormality   Rash and nonspecific skin eruption   Nasal swelling   Acute bronchitis   Chest tightness   DOE (dyspnea on exertion)   Symptomatic anemia   Melena   Adrenal infarction    Time spent: 40 minute   WOODS, [place], J  [facility] Pager [phone]. If 7PM-7AM, please contact night-coverage at  [email or web address], password [email or web address] 11/14/2013, 7:29 AM  LOS: 7 days

## 2013-11-14 NOTE — Progress Notes (Signed)
Her status is clearly more tenuous. She now down in the ICU. On her CT scan yesterday, she had an adrenal gland infarct. Her spleen looked okay.   She now has a swollen right arm. Doppler shows a superficial thrombus.  She definitely, my mind, is hypercoagulable now.  I looked at her blood smear. Even though she is thrombocytopenic, I think this is false as she has numerous platelet clumps.  I think we have to start her on heparin.  Her bone marrow biopsy does not show acute leukemia. 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 This would explain down to some degree, her elevated white cell count. 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  She now has mouth ulcerations. These look viral. I will start her on acyclovir.  One possibility that I thought we should be looking at is Sweet syndrome. She does need some of the criteria for this. She does not have the typical dermatologic changes however.  She is on steroids with hydrocortisone secondary to adrenal insufficiency.  Her cultures still have come back negative.  She's had a Foley catheter put in. This will help monitor her intake and output.  I think that she will need a second central line. We need more antibiotics. We need a heparin drip.  She is still "congested".  She is afebrile right now. Her blood pressure, when I was in the room, was 98/50. Her oral exam does show ulcerations. No adenopathy in the neck. Lung shows some crackles bilaterally. Cardiac exam tachycardic and regular. Abdomen is soft. There is no tenderness over on the left abdomen. Extremities she does show the swelling in the right arm. There is erythema and tenderness of the elbow. There is some erythema noted.  Again, this is very complex. Her pro calcitonin is over 175. Cultures were negative. I wonder she did have some, septic thrombus in the right arm. I will start  vancomycin.  Again I think that she is hypercoagulable. I don't think her platelet count is as low as is been reported. Again on her blood smear she has several platelet clumps.  I will have to try to speak with her transplant doctor. We will see if they feel if transfer is warranted.  I appreciate all the great care that she is getting for the hospitalist and the staff!!  [name]  Psalm 27:14

## 2013-11-14 NOTE — Progress Notes (Signed)
Pharmacy:  Brief Heparin Note  Heparin level tonight undetectable at < 0.01 (Goal 0.3-0.7)  Plan 1.) Rebolus 2000 units x 1, then increase gtt rate to 1300 units/hr 2.) Repeat Heparin level at 0300   Dillan Candela, Gaye Alken PharmD Pager #: [phone] 8:59 PM 11/14/2013

## 2013-11-14 NOTE — Progress Notes (Signed)
ANTIBIOTIC CONSULT NOTE - INITIAL  Pharmacy Consult for Vancomycin, anti-infective renal adjustment Indication: rule out sepsis  Allergies  Allergen Reactions  . Penicillins Hives and Rash    Patient Measurements: Height: 5' (152.4 cm) Weight: 212 lb 15.4 oz (96.6 kg) IBW/kg (Calculated) : 45.5  Vital Signs: Temp: 99.3 F (37.4 C) (02/17 0645) Temp src: Core (Comment) (02/17 0545) BP: 74/36 mmHg (02/17 0100) Pulse Rate: 114 (02/17 0645) Intake/Output from previous day: 02/16 0701 - 02/17 0700 In: 7176 [P.O.:270; I.V.:800; IV CXKGYJEHU:3149] Out: 530 [Urine:530]  Labs:  Recent Labs  11/12/13 0505 11/13/13 0445 11/13/13 2313 11/14/13 0435  WBC 22.3* 21.5*  --  PENDING  HGB 9.7* 10.1*  --  8.5*  PLT 69* 63*  --  PENDING  CREATININE 1.12* 1.06 2.10* 1.90*   Estimated Creatinine Clearance: 33.2 ml/min (by C-G formula based on Cr of 1.9).  Microbiology: Recent Results (from the past 720 hour(s))  CULTURE, BLOOD (ROUTINE X 2)     Status: None   Collection Time    11/08/13 12:40 AM      Result Value Ref Range Status   Specimen Description BLOOD LEFT ANTECUBITAL   Final   Special Requests BOTTLES DRAWN AEROBIC AND ANAEROBIC 3CC   Final   Culture  Setup Time     Final   Value: 11/08/2013 03:32     Performed at [facility]   Culture     Final   Value: NO GROWTH 5 DAYS     Performed at [facility]   Report Status 11/14/2013 FINAL   Final  CULTURE, BLOOD (ROUTINE X 2)     Status: None   Collection Time    11/08/13 12:45 AM      Result Value Ref Range Status   Specimen Description BLOOD LEFT HAND   Final   Special Requests BOTTLES DRAWN AEROBIC AND ANAEROBIC 3CC   Final   Culture  Setup Time     Final   Value: 11/08/2013 03:31     Performed at [facility]   Culture     Final   Value: NO GROWTH 5 DAYS     Performed at [facility]   Report Status 11/14/2013 FINAL   Final  RESPIRATORY VIRUS PANEL     Status: None   Collection  Time    11/11/13 12:00 AM      Result Value Ref Range Status   Source - RVPAN NASOPHARYNGEAL   Final   Respiratory Syncytial Virus A NOT DETECTED   Final   Respiratory Syncytial Virus B NOT DETECTED   Final   Influenza A NOT DETECTED   Final   Influenza B NOT DETECTED   Final   Parainfluenza 1 NOT DETECTED   Final   Parainfluenza 2 NOT DETECTED   Final   Parainfluenza 3 NOT DETECTED   Final   Metapneumovirus NOT DETECTED   Final   Rhinovirus NOT DETECTED   Final   Adenovirus NOT DETECTED   Final   Influenza A H1 NOT DETECTED   Final   Influenza A H3 NOT DETECTED   Final   Comment: (NOTE)           Normal Reference Range for each Analyte: NOT DETECTED     Testing performed using the Luminex xTAG Respiratory Viral Panel test     kit.     This test was developed and its performance characteristics determined     by [facility]. It  has not been cleared or approved by the [place]     Food and Drug Administration. This test is used for clinical purposes.     It should not be regarded as investigational or for research. This     laboratory is certified under the [place] (CLIA) as qualified to perform high complexity     clinical laboratory testing.     Performed at [place], BLOOD (ROUTINE X 2)     Status: None   Collection Time    11/13/13  6:20 AM      Result Value Ref Range Status   Specimen Description BLOOD LEFT HAND   Final   Special Requests     Final   Value: BOTTLES DRAWN AEROBIC AND ANAEROBIC [place] [facility] 3CC ANA   Culture  Setup Time     Final   Value: 11/13/2013 08:35     Performed at [facility]   Culture     Final   Value:        BLOOD CULTURE RECEIVED NO GROWTH TO DATE CULTURE WILL BE HELD FOR 5 DAYS BEFORE ISSUING A FINAL NEGATIVE REPORT     Performed at [facility]   Report Status PENDING   Incomplete  CULTURE, BLOOD (ROUTINE X 2)     Status: None   Collection Time    11/13/13   6:35 AM      Result Value Ref Range Status   Specimen Description BLOOD LEFT HAND   Final   Special Requests     Final   Value: BOTTLES DRAWN AEROBIC AND ANAEROBIC [place] AER 3CC ANA   Culture  Setup Time     Final   Value: 11/13/2013 08:35     Performed at [facility]   Culture     Final   Value:        BLOOD CULTURE RECEIVED NO GROWTH TO DATE CULTURE WILL BE HELD FOR 5 DAYS BEFORE ISSUING A FINAL NEGATIVE REPORT     Performed at [facility]   Report Status PENDING   Incomplete  MRSA PCR SCREENING     Status: None   Collection Time    11/13/13  6:04 PM      Result Value Ref Range Status   MRSA by PCR NEGATIVE  NEGATIVE Final   Comment:            The GeneXpert MRSA Assay (FDA     approved for NASAL specimens     only), is one component of a     comprehensive MRSA colonization     surveillance program. It is not     intended to diagnose MRSA     infection nor to guide or     monitor treatment for     MRSA infections.    Medical History: Past Medical History  Diagnosis Date  . Cancer   . Cough productive of clear sputum 01/04/2013  . Fibromyalgia     Anti-infectives:  2/10 >> cipro >> 2/13 2/11 >> aztreo >> 2/13 2/11 >> vanc >> 2/15 2/16 >> primaxin >>  2/16 >> voriconazole >>  2/17 >> vancomycin >> 2/17 >> acyclovir >>    Assessment: 16 yoF admitted 2/10 with N/V, abdominal pain, fever, and melena. PMH includes Hodgkin's lymphoma, s/p chemo, and myelodysplastic syndrome.  She was initially started on broad spectrum antibiotics, which were d/c on 2/13 and 2/15.  Primaxin  and voriconazole were started on 2/16 for fevers, immunocompromised status, r/o infection (sinus).  Overnight she was transferred to the ICU for persistent hypotension and r/o sepsis.  Acyclovir is added for potentially viral mouth ulcers and Pharmacy is consulted to dose vancomycin.  Tmax: 103.8  WBCs: 21K   Renal: Scr 1.9, CrCl ~ 33 ml/min CG  Cultures remain negative.  Goal of  Therapy:  Vancomycin trough level 15-20 mcg/ml Appropriate abx dosing, eradication of infection.   Plan:   Continue voriconazole 360 mg (4 mg/kg) IV q12h  Acyclovir $RemoveBe'230mg'tMVEdepZA$  (5 mg/kg * ideal weight 45.5 kg) IV q12h   Primaxin $RemoveB'250mg'HVIvwvns$  IV q6h  Vancomycin 2g IV x1 then $Remov'1250mg'loJoYs$  IV q24h.  Measure Vanc trough at steady state.  Follow up renal fxn and culture results.  Gretta Arab PharmD, BCPS Pager [phone] 11/14/2013 9:44 AM

## 2013-11-14 NOTE — Progress Notes (Signed)
Event: Notified by RN several times this evening regarding persistent hypotension that has been refractory to several IVF boluses. [facility] requested A-Line and procalcitonin level. A-line has been placed. NP to bedside. Subjective: Pt denies specific c/o's at this time. Objective: Ms. Yzaguirre is a 58 y.o. Pt w/ known h/o Hodgkin's lymphoma s/p chemotherapy in the past who has myelodysplastic syndrome who was admitted on 11/09/2013 after presenting to ED w/ c/o N/V and (R) sided lower abd pain. These symptoms were associated w/ productive cough (for which she completed a regimen of Amoxicillin) and worsening DOE and fever. Her ED evaluation was suspicious for Pyelonephritis, symptomatic anemia, bronchitis, rash, symptomatic myelodysplastic syndrome and h/a w/ blurred vision felt most likely associated w/ sinusitis.  Oncology is following and feels pt may be transforming to acute leukemia and are awaiting bone marrow biopsy report. They indicate pt may need tx to [facility] for induction chemotherapy. On 2/16 at 0300 pt spiked fever of 103 and was placed on Primaxin and VFEND and moved to SDU. Temp has been wnl since 2330 but BP's have remained soft. At bedside pt noted resting in NAD. She arouses easily and is able to answer questions appropriately and follow commands. Skin w/d. BBS CTA. Abd soft, non-distended and nt on gentle palpation w/ normal bs. Pt remains afebrile. Current MAP's 55-60 via A-Line. Tachycardia much improved, current HR 106 w/ RRR. Pt w/ 02 sats of 100% on 2L [place]. Ct abd/pelvis revealed findings suggestive of pyelonephritis and (L) adrenal gland infarction w/ s/s c/w adrenal insufficiency (n/v, fever, anorexia and weakness). Labs obtained at approx 2300 on 2/16 reveal increased creatinine (2.10 from 1.06) w/ diminished uop,  and mild hypokalemia (3.2) which was repleted. Procalcitonin >175.00, however lactic acid is 1.2. Repeat u/a 2/16 PM negative. WBC trending down but remains quite elevated at  21.5. Blood cx's negative, urine culture pending.  CXR on 2/16 am w/ new findings "Worsening right perihilar opacification" which could suggest atelectasis vs infection. Pt only on Primaxin at this time.   Assessment/Plan: 1. Persistent Hypotension:  Infectious process vs adrenal insufficiency. Will repeat PCXR this am. Follow urine culture. Pt has received 3.5L IVF's w/ decreased UOP.  Low threshold to consult CCM if unable to maintain MAPs  55-60. Will continue to monitor closely in SDU.  Jeryl Columbia, NP-C [facility] Pager [phone]

## 2013-11-14 NOTE — Progress Notes (Signed)
RT attempted arterial line, unsuccessful--MD/RN made aware

## 2013-11-15 DIAGNOSIS — R799 Abnormal finding of blood chemistry, unspecified: Secondary | ICD-10-CM

## 2013-11-15 DIAGNOSIS — N179 Acute kidney failure, unspecified: Secondary | ICD-10-CM | POA: Diagnosis not present

## 2013-11-15 DIAGNOSIS — R6889 Other general symptoms and signs: Secondary | ICD-10-CM

## 2013-11-15 DIAGNOSIS — J189 Pneumonia, unspecified organism: Secondary | ICD-10-CM

## 2013-11-15 DIAGNOSIS — A419 Sepsis, unspecified organism: Secondary | ICD-10-CM

## 2013-11-15 DIAGNOSIS — I959 Hypotension, unspecified: Secondary | ICD-10-CM

## 2013-11-15 LAB — CBC WITH DIFFERENTIAL/PLATELET
Basophils Absolute: 0.2 K/uL — ABNORMAL HIGH (ref 0.0–0.1)
Basophils Relative: 1 % (ref 0–1)
Eosinophils Absolute: 0.2 K/uL (ref 0.0–0.7)
Eosinophils Relative: 1 % (ref 0–5)
HCT: 24.9 % — ABNORMAL LOW (ref 36.0–46.0)
Hemoglobin: 7.9 g/dL — ABNORMAL LOW (ref 12.0–15.0)
Lymphocytes Relative: 6 % — ABNORMAL LOW (ref 12–46)
Lymphs Abs: 1.2 K/uL (ref 0.7–4.0)
MCH: 29.9 pg (ref 26.0–34.0)
MCHC: 31.7 g/dL (ref 30.0–36.0)
MCV: 94.3 fL (ref 78.0–100.0)
Monocytes Absolute: 1.5 K/uL — ABNORMAL HIGH (ref 0.1–1.0)
Monocytes Relative: 8 % (ref 3–12)
Neutro Abs: 16.1 K/uL — ABNORMAL HIGH (ref 1.7–7.7)
Neutrophils Relative %: 84 % — ABNORMAL HIGH (ref 43–77)
Platelets: UNDETERMINED K/uL (ref 150–400)
RBC: 2.64 MIL/uL — ABNORMAL LOW (ref 3.87–5.11)
RDW: 20.1 % — ABNORMAL HIGH (ref 11.5–15.5)
WBC: 19.2 K/uL — ABNORMAL HIGH (ref 4.0–10.5)

## 2013-11-15 LAB — RETICULOCYTES
RBC.: 2.64 MIL/uL — ABNORMAL LOW (ref 3.87–5.11)
Retic Count, Absolute: 18.5 K/uL — ABNORMAL LOW (ref 19.0–186.0)
Retic Ct Pct: 0.7 % (ref 0.4–3.1)

## 2013-11-15 LAB — COMPREHENSIVE METABOLIC PANEL
ALT: 26 U/L (ref 0–35)
AST: 40 U/L — ABNORMAL HIGH (ref 0–37)
Albumin: 1.8 g/dL — ABNORMAL LOW (ref 3.5–5.2)
Alkaline Phosphatase: 70 U/L (ref 39–117)
BUN: 18 mg/dL (ref 6–23)
CO2: 18 meq/L — AB (ref 19–32)
CREATININE: 1.61 mg/dL — AB (ref 0.50–1.10)
Calcium: 7.8 mg/dL — ABNORMAL LOW (ref 8.4–10.5)
Chloride: 111 mEq/L (ref 96–112)
GFR, EST AFRICAN AMERICAN: 39 mL/min — AB (ref >90)
GFR, EST NON AFRICAN AMERICAN: 34 mL/min — AB (ref >90)
GLUCOSE: 163 mg/dL — AB (ref 70–99)
Potassium: 3.2 mEq/L — ABNORMAL LOW (ref 3.7–5.3)
SODIUM: 143 meq/L (ref 137–147)
Total Bilirubin: 0.3 mg/dL (ref 0.3–1.2)
Total Protein: 6.3 g/dL (ref 6.0–8.3)

## 2013-11-15 LAB — HEPARIN LEVEL (UNFRACTIONATED)
HEPARIN UNFRACTIONATED: 0.13 [IU]/mL — AB (ref 0.30–0.70)
HEPARIN UNFRACTIONATED: 0.2 [IU]/mL — AB (ref 0.30–0.70)
Heparin Unfractionated: 0.3 IU/mL (ref 0.30–0.70)

## 2013-11-15 LAB — MAGNESIUM: Magnesium: 1.9 mg/dL (ref 1.5–2.5)

## 2013-11-15 LAB — LACTATE DEHYDROGENASE: LDH: 574 U/L — ABNORMAL HIGH (ref 94–250)

## 2013-11-15 LAB — PROCALCITONIN: Procalcitonin: >175 ng/mL

## 2013-11-15 MED ORDER — HEPARIN (PORCINE) IN NACL 100-0.45 UNIT/ML-% IJ SOLN
1700.0000 [IU]/h | INTRAMUSCULAR | Status: DC
  Administered 2013-11-15: 1700 [IU]/h via INTRAVENOUS
  Filled 2013-11-15 (×2): qty 250

## 2013-11-15 MED ORDER — HEPARIN BOLUS VIA INFUSION
2000.0000 [IU] | Freq: Once | INTRAVENOUS | Status: AC
  Administered 2013-11-15: 2000 [IU] via INTRAVENOUS
  Filled 2013-11-15: qty 2000

## 2013-11-15 MED ORDER — HEPARIN (PORCINE) IN NACL 100-0.45 UNIT/ML-% IJ SOLN
1900.0000 [IU]/h | INTRAMUSCULAR | Status: DC
  Filled 2013-11-15 (×3): qty 250

## 2013-11-15 NOTE — Progress Notes (Signed)
ANTICOAGULATION CONSULT NOTE - Follow Up Consult  Pharmacy Consult for Heparin Indication: VTE treatment  Allergies  Allergen Reactions  . Penicillins Hives and Rash    Patient Measurements: Height: 5' (152.4 cm) Weight: 223 lb 15.8 oz (101.6 kg) IBW/kg (Calculated) : 45.5 Heparin Dosing Weight: 69 kg  Vital Signs: Temp: 100.2 F (37.9 C) (02/18 1200) Temp src: Core (Comment) (02/18 1200) Pulse Rate: 119 (02/18 1200)  Labs:  Recent Labs  11/13/13 0445 11/13/13 2313 11/14/13 0435 11/14/13 0840 11/14/13 1040 11/14/13 1635 11/14/13 1900 11/14/13 2140 11/15/13 0508 11/15/13 1225  HGB 10.1*  --  8.5*  --   --   --   --   --  7.9*  --   HCT 30.4*  --  27.0*  --   --   --   --   --  24.9*  --   PLT 63*  --  PLATELET CLUMPS NOTED ON SMEAR, COUNT APPEARS DECREASED 46*  --   --   --   --  PLATELET CLUMPS NOTED ON SMEAR, UNABLE TO ESTIMATE  --   HEPARINUNFRC  --   --   --   --   --   --  <0.10*  --  0.13* 0.30  CREATININE 1.06 2.10* 1.90*  --   --   --   --   --  1.61*  --   TROPONINI  --   --   --   --  <0.30 <0.30  --  <0.30  --   --     Estimated Creatinine Clearance: 40.3 ml/min (by C-G formula based on Cr of 1.61).   Medications:  Infusions:  . sodium chloride 100 mL/hr at 11/15/13 1200  . heparin 1,700 Units/hr (11/15/13 1200)    Assessment: 54 yoF admitted 2/10 with N/V, abdominal pain, fever, and melena. PMH includes Hodgkin's lymphoma, s/p chemo, and myelodysplastic syndrome.  Doppler completed 2/15 and shows no DVT, but noted superficial thrombus in the right cephalic vein from [facility] to mid upper arm. Pharmacy consulted to dose IV Heparin for thrombus and MD suspects hypercoagulable status.   Hemoccult negative x1 on 2013-11-09   CBC: Hgb decreased (8.5 --> 7.9), Plt are clumped today. Last value of Plt = 46 on 2/17.   MD is aware of thrombocytopenia.   Pt did receive Aransep injection on 11/09/13 and 2 units PRBC on 2/13.  No active bleeding  reported.  Heparin level 0.3 (drawn early), but within the goal range.   Goal of Therapy:  Heparin level 0.3-0.7 units/ml Monitor platelets by anticoagulation protocol: Yes   Plan:   Continue heparin IV infusion at 1700 units/hr  Heparin level in 6 hours to confirm therapeutic level  Daily heparin level and CBC  Continue to monitor H&H and platelets closely with known anemia and thrombocytopenia.   Gretta Arab PharmD, BCPS Pager [phone] 11/15/2013 12:54 PM

## 2013-11-15 NOTE — Progress Notes (Signed)
[facility]    Date of Admission:  11/24/2013   Total days of antibiotics 9        Day 8 vanco        Day 3 acy        Day 3 imipenem        Day 3 voriconazole   ID: Lacey [name] is a 58 y.o. 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  Principal Problem:   PNA (pneumonia) Active Problems:   Hodgkin lymphoma   Myelodysplasia   Adrenal abnormality   Rash and nonspecific skin eruption   DOE (dyspnea on exertion)   Symptomatic anemia   Melena   Adrenal infarction   Hypotension, unspecified   Sepsis   Acute renal failure    Subjective: Afebrile  24 hr event = underwent chest CT showed partial loculated R/L effusion  Medications:  . acyclovir  5 mg/kg (Ideal) Intravenous Q12H  . dextromethorphan-guaiFENesin  1 tablet Oral q morning - 10a  . DULoxetine  60 mg Oral QPM  . fluticasone  2 spray Each Nare Daily  . gabapentin  600 mg Oral BID  . hydrocortisone sodium succinate  100 mg Intravenous Q8H  . imipenem-cilastatin  250 mg Intravenous 4 times per day  . ipratropium-albuterol  3 mL Nebulization QID  . magic mouthwash w/lidocaine  10 mL Oral Q4H  . sodium chloride  10-40 mL Intracatheter Q12H  . sodium chloride  3 mL Intravenous Q12H  . vancomycin  1,250 mg Intravenous Q24H    Objective: Vital signs in last 24 hours: Temp:  [99.3 F (37.4 C)-100.2 F (37.9 C)] 99.9 F (37.7 C) (02/18 1300) Pulse Rate:  [108-124] 124 (02/18 1300) Resp:  [14-27] 15 (02/18 1300) SpO2:  [93 %-100 %] 100 % (02/18 1300) Weight:  [223 lb 15.8 oz (101.6 kg)] 223 lb 15.8 oz (101.6 kg) (02/18 0500)  Constitutional: oriented to person, place, and time. appears well-developed and well-nourished. No distress.  HENT:  Mouth/Throat:  Oropharynx is clear and moist. No oropharyngeal exudate. Numerous lesions on oral mucosa of lower lip  Cardiovascular: Normal rate, regular rhythm and normal heart sounds. Exam reveals no gallop and no friction rub.  No murmur heard.  Pulmonary/Chest: Effort normal and breath sounds normal. No respiratory distress. has no wheezes.  Abdominal: Soft. Bowel sounds are normal. exhibits no distension. There is no tenderness.  Lymphadenopathy: no cervical adenopathy.  Neurological: alert and oriented to person, place, and time.  Skin: Skin is warm and dry. No rash noted. No erythema.  Psychiatric: a normal mood and affect. behavior is normal.  Ext = right arm swelling and erythema to antecubital fossa with associated edema. Left arm picc line.   Lab Results  Recent Labs  11/14/13 0435 11/15/13 0508  WBC 21.0* 19.2*  HGB 8.5* 7.9*  HCT 27.0* 24.9*  NA 139 143  K 3.9 3.2*  CL 107 111  CO2 22 18*  BUN 18 18  CREATININE 1.90* 1.61*   Liver Panel  Recent Labs  11/14/13 0435 11/15/13 0508  PROT 5.6* 6.3  ALBUMIN 1.7* 1.8*  AST 34 40*  ALT 20 26  ALKPHOS 64 70  BILITOT 0.3 0.3   Sedimentation Rate  Recent Labs  11/13/13 2122  ESRSEDRATE 131*   C-Reactive Protein  Recent Labs  11/14/13 0435  CRP 39.8*    Microbiology: 2/11 blood cx ngtd  2/16 blood cx ngtd  2/16 urine cx ngtd 2/17 respiratory cx ngtd in length   Studies/Results: Ct Chest Wo Contrast  11/14/2013   CLINICAL DATA:  Fever.  Shortness of breath.  Abnormal chest x-ray.  EXAM: CT CHEST WITHOUT CONTRAST  TECHNIQUE: Multidetector CT imaging of the chest was performed following the standard protocol without IV contrast.  COMPARISON:  Chest CT 11/20/2013.  FINDINGS: Mediastinum: Left upper extremity PICC with tip terminating in the superior aspect of the right atrium. Heart size is mildly enlarged. There is no significant pericardial fluid, thickening or pericardial calcification. Soft tissue stranding in the  anterior mediastinum is new compared to the prior study. Numerous reactive size mediastinal lymph nodes are noted the middle mediastinum. Esophagus is unremarkable in appearance.  Lungs/Pleura: Compared to the prior study there are new small a moderate bilateral pleural effusions (right greater than left). There is some new atelectasis and/or consolidation in the medial aspect of the right upper lobe which accounts for the perceived abnormality on the recent chest radiograph, as well as some adjacent anteriorly loculated right effusion superiorly. There also areas of mild passive atelectasis in the lower lobes of the lungs bilaterally. No suspicious appearing pulmonary nodules or masses are identified. Right perihilar architectural distortion similar to the prior study appears to be chronic.  Upper Abdomen: Low-attenuation hepatic lesions again noted, largest of which is in the central aspect of segments 7 and 8 measuring 3.1 x 2.6 cm. Left adrenal gland appears enlarged and demonstrates some internal high attenuation, which may suggest and adrenal infarct, similar to the recent prior study.  Musculoskeletal: Multiple prominent borderline enlarged right axillary lymph nodes, largest of which measures 9 mm. There are no aggressive appearing lytic or blastic lesions noted in the visualized portions of the skeleton. Orthopedic fixation hardware in the lower cervical spine.  IMPRESSION: 1. Interval development of small to moderate bilateral pleural effusions (right greater than left) with some atelectasis and/or consolidation in the medial aspect of the right upper lobe which accounts for the perceived abnormality on the recent chest radiograph. Clinical correlation for signs and symptoms of right upper lobe pneumonia is recommended. This could represent infection with typical or atypical organisms, including fungal pneumonia in this immunocompromised host. Partial loculation of the right pleural effusion anteriorly is  also noted. 2. Soft tissue stranding in the anterior mediastinum is of uncertain etiology and significance. If there been recent attempted central line placement, this could be iatrogenic. Less likely, this might be infectious or inflammatory, and clinical correlation for signs and symptoms of mediastinitis may be warranted. 3. Enlargement and high attenuation in the left adrenal gland, similar to the prior study, concerning for recent adrenal hemorrhage. 4. Additional incidental findings, as above.   Electronically Signed   By: Vinnie Langton M.D.   On: 11/14/2013 18:32   Dg Chest Port 1 View  11/14/2013   CLINICAL DATA:  Fever, cough  EXAM: PORTABLE CHEST - 1 VIEW  COMPARISON:  Portable exam 0738 hr compared to 11/13/2013  FINDINGS: Upper normal heart size.  Again identified prominence of the right hilum question due to a combination of prominent perihilar vascular structures and loculated fluid within the minor fissure.  Component of persistent right perihilar infiltrate versus scarring in right upper lobe unchanged.  Pleural effusion blunts the right lateral costophrenic angle.  Left lung clear.  No pneumothorax.  Prior cervical spine  fusion.  IMPRESSION: Persistent right upper lobe infiltrate versus scarring in perihilar region.  Right pleural effusion likely partially loculated in minor fissure.   Electronically Signed   By: Lavonia Dana M.D.   On: 11/14/2013 08:11     Assessment/Plan: 58 y.o. 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    hcap = Would continue with vancomycin and imipenem for 10-14days. Doing extended course for immunocompromised host and slow response to therapy.  Intermittent fevers =could be due to adrenal insufficiency. Not completely clear that it is not  infection. Work up has been negative, but most recent cultures were done while on antibiotics. Will get fungal blood cx if patient has repeat fevers and   Rash = resolved  Mouth ulcer =  hsv swab cx is pending, continue with acyclovir   Empiric antifungals = awaiting fungitell assay to see if elevated as sign of invasive fungal infection. and aspergillus antigen. Please discontinue voriconazole  Glorene Leitzke, [facility] Cell: [phone] Pager: [phone]  11/15/2013, 1:50 PM

## 2013-11-15 NOTE — Progress Notes (Signed)
Looks a lot better today. She is much more alert. She is on broad-spectrum antibiotics. Infectious disease has seen her. They recommend we stop the voriconazole right now. I have no problems with this.  She is on heparin infusion. She had a PICC line placed. She had a superficial thrombus in the right arm. Normal still a little swollen but not as red.  She has adrenal insufficiency. She had a very high ACTH level. She is on hydrocortisone.  Chest CT scan done yesterday. She has a moderate right effusion. There is some consolidation in the upper lobe. Aspergillus antigen is pending.  , Sure if we've sent off CMV titers. This may be something about.  We will he cannot treat the underlying bone marrow disorder right now for fear that if we knocked out her blood counts any underlying infection would progress to quickly.  I still cannot explain the adrenal infarct. I suspect there might be some kind of hypercoagulable issue. I have not seen this with her type of bone marrow problem.  Her labs little better. Her pro calcitonin still quite high. Renal function is okay. LDH is stable. Hemoglobin is 7.9. She has an incredibly low reticulocyte count so repot widely about transfuse her at some point.  On her exam, she is decent breath sounds. There may be some slight decrease on the right side. Cardiac exam is tachycardic but regular. Abdomen is soft. I cannot palpate any tenderness on the left side. X-ray shows the swelling in the right arm. This is about 2-3+. The arm is not as erythematous. She has 1+ swelling in her legs.  I very much appreciate everybody's help. This is proven to be an incredibly tough problem.  I spoke to her transplant doctor at [facility]. He doesn't think that they would do anything different than what we are doing right now. He does not feel that her bone marrow disorder needs to be treated right now. He can't think of any correlation with what is going on with her and her bone  marrow.  We will continue to utilize aggressive interventions.  We will have  to watch her platelet count closely.  [name]  Job 5:18

## 2013-11-15 NOTE — Consult Note (Addendum)
Name: Lacey [name] MRN: [id number] DOB: March 04, 1956    ADMISSION DATE:  10/30/2013 CONSULTATION DATE:  11/15/13  REFERRING MD :  Tyrell Antonio  PRIMARY SERVICE:  [facility]  CHIEF COMPLAINT:  Pleural effusion   BRIEF PATIENT DESCRIPTION: 58yo female with hx Hodgkins lymphoma s/p chemo & auto BMT in may 2014, myelodysplastic syndrome admitted 2/10 with flu like symptoms after an 8 [place],  Labs & XR  C/w pyelonephritis, purulent bronchitis and anemia.  Treated with abx and supportive care.  CT chest r/o PE, abd CT showed L adrenal infarct.  BM bx thought c/w CMML.  Infectious w/u remains negative.  On 2/17 developed worsening cough and repeat CT chest showed loculated R>L effusions and PCCM consulted.   SIGNIFICANT EVENTS / STUDIES:  CT abd/pelvis>>> . Bilateral adrenal gland abnormality, new on the left since 11/21/2013. 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 2. New small bilateral pleural effusions. CT chest 2/17>>> Partially loculated moderate R>L pleural effusions, RUL consolidation, L adrenal enlargement   LINES / TUBES: L Rad aline>>>  CULTURES: Sputum 2/17>>> Urine 2/17>>>neg  BCx2 2/11>>>neg  RVP>>> NEG  ANTIBIOTICS: Acyclovir 2/17>>> Imipenem 2/17>>> Vanc 2/17>>>  HISTORY OF PRESENT ILLNESS:  58yo female with hx Hodgkins lymphoma s/p chemo, myelodysplastic syndrome admitted 2/10 with pyelonephritis, purulent bronchitis and anemia.  Treated with abx and supportive care.  CT chest r/o PE, abd CT showed L adrenal infarct.  BM bx thought c/w CMML.  Infectious w/u remains negative.  On 2/17 developed worsening cough and repeat CT chest showed loculated R>L effusions and PCCM consulted.  C/o mild SOB at times, cough.  Denies chest pain, hemoptysis, purulent sputum.   PAST MEDICAL HISTORY :  Past Medical History  Diagnosis Date  . Cancer   . Cough productive of clear sputum 01/04/2013  . Fibromyalgia    Past Surgical History  Procedure Laterality Date  . Bone  marrow biopsy  05/20110  . Spine surgery  1998    c-4 c 5 fusion  . Hodgkin     Prior to Admission medications   Medication Sig Start Date End Date Taking? Authorizing Provider  cholecalciferol (VITAMIN D) 1000 UNITS tablet Take 1,000 Units by mouth daily.   Yes Historical Provider, MD  DULoxetine (CYMBALTA) 60 MG capsule Take 60 mg by mouth every evening.   Yes Historical Provider, MD  gabapentin (NEURONTIN) 300 MG capsule Take 600 mg by mouth 2 (two) times daily.    Yes Historical Provider, MD  Nyoka Cowden Tea 250 MG CAPS Take by mouth every morning.   Yes Historical Provider, MD  amoxicillin (AMOXIL) 500 MG capsule Take 500 mg by mouth 3 (three) times daily. For 7 days. 10/26/13   Historical Provider, MD   Allergies  Allergen Reactions  . Penicillins Hives and Rash    FAMILY HISTORY:  Family History  Problem Relation Age of Onset  . Cancer Mother   . Cancer Father    SOCIAL HISTORY:  reports that she has never smoked. She has never used smokeless tobacco. She reports that she drinks alcohol. She reports that she does not use illicit drugs.  REVIEW OF SYSTEMS:   As per HPI - All other systems reviewed and were neg.  Constitutional: negative for anorexia, POS for fevers and sweats  Eyes: negative for irritation, redness and visual disturbance  Ears, nose, mouth, throat, and face: negative for earaches, epistaxis, nasal congestion and sore throat  Respiratory: negative for cough, dyspnea on exertion, sputum and wheezing  Cardiovascular: negative for  chest pain, dyspnea, lower extremity edema, orthopnea, palpitations and syncope  Gastrointestinal: negative for abdominal pain, constipation, diarrhea, melena, nausea and vomiting  Genitourinary:negative for dysuria, frequency and hematuria  Hematologic/lymphatic: negative for bleeding, easy bruising and lymphadenopathy  Musculoskeletal:negative for arthralgias, muscle weakness and stiff joints  Neurological: negative for coordination  problems, gait problems, headaches and weakness  Endocrine: negative for diabetic symptoms including polydipsia, polyuria and weight loss    VITAL SIGNS: Temp:  [99.3 F (37.4 C)-100 F (37.8 C)] 100 F (37.8 C) (02/18 0800) Pulse Rate:  [108-123] 116 (02/18 0800) Resp:  [14-27] 19 (02/18 0800) SpO2:  [93 %-100 %] 95 % (02/18 0807) Weight:  [223 lb 15.8 oz (101.6 kg)] 223 lb 15.8 oz (101.6 kg) (02/18 0500)  PHYSICAL EXAMINATION: General:  Chronically ill appearing female, NAD  Neuro:  Awake, alert, MAE HEENT:  Mm moist, no JVD Cardiovascular:  s1s2 rrr Lungs:  resps even non labored on [place], diminished bases R>L, few scattered rhonchi  Abdomen:  Soft, +bs Musculoskeletal:  Warm and dry, scant BLE edema   Recent Labs Lab 11/13/13 2313 11/14/13 0435 11/15/13 0508  NA 139 139 143  K 3.2* 3.9 3.2*  CL 104 107 111  CO2 24 22 18*  BUN 16 18 18   CREATININE 2.10* 1.90* 1.61*  GLUCOSE 87 95 163*    Recent Labs Lab 11/13/13 0445 11/14/13 0435 11/14/13 0840 11/15/13 0508  HGB 10.1* 8.5*  --  7.9*  HCT 30.4* 27.0*  --  24.9*  WBC 21.5* 21.0*  --  19.2*  PLT 63* PLATELET CLUMPS NOTED ON SMEAR, COUNT APPEARS DECREASED 46* PLATELET CLUMPS NOTED ON SMEAR, UNABLE TO ESTIMATE   Ct Chest Wo Contrast  11/14/2013   CLINICAL DATA:  Fever.  Shortness of breath.  Abnormal chest x-ray.  EXAM: CT CHEST WITHOUT CONTRAST  TECHNIQUE: Multidetector CT imaging of the chest was performed following the standard protocol without IV contrast.  COMPARISON:  Chest CT 11/21/2013.  FINDINGS: Mediastinum: Left upper extremity PICC with tip terminating in the superior aspect of the right atrium. Heart size is mildly enlarged. There is no significant pericardial fluid, thickening or pericardial calcification. Soft tissue stranding in the anterior mediastinum is new compared to the prior study. Numerous reactive size mediastinal lymph nodes are noted the middle mediastinum. Esophagus is unremarkable in  appearance.  Lungs/Pleura: Compared to the prior study there are new small a moderate bilateral pleural effusions (right greater than left). There is some new atelectasis and/or consolidation in the medial aspect of the right upper lobe which accounts for the perceived abnormality on the recent chest radiograph, as well as some adjacent anteriorly loculated right effusion superiorly. There also areas of mild passive atelectasis in the lower lobes of the lungs bilaterally. No suspicious appearing pulmonary nodules or masses are identified. Right perihilar architectural distortion similar to the prior study appears to be chronic.  Upper Abdomen: Low-attenuation hepatic lesions again noted, largest of which is in the central aspect of segments 7 and 8 measuring 3.1 x 2.6 cm. Left adrenal gland appears enlarged and demonstrates some internal high attenuation, which may suggest and adrenal infarct, similar to the recent prior study.  Musculoskeletal: Multiple prominent borderline enlarged right axillary lymph nodes, largest of which measures 9 mm. There are no aggressive appearing lytic or blastic lesions noted in the visualized portions of the skeleton. Orthopedic fixation hardware in the lower cervical spine.  IMPRESSION: 1. Interval development of small to moderate bilateral pleural effusions (right greater than  left) with some atelectasis and/or consolidation in the medial aspect of the right upper lobe which accounts for the perceived abnormality on the recent chest radiograph. Clinical correlation for signs and symptoms of right upper lobe pneumonia is recommended. This could represent infection with typical or atypical organisms, including fungal pneumonia in this immunocompromised host. Partial loculation of the right pleural effusion anteriorly is also noted. 2. Soft tissue stranding in the anterior mediastinum is of uncertain etiology and significance. If there been recent attempted central line placement, this  could be iatrogenic. Less likely, this might be infectious or inflammatory, and clinical correlation for signs and symptoms of mediastinitis may be warranted. 3. Enlargement and high attenuation in the left adrenal gland, similar to the prior study, concerning for recent adrenal hemorrhage. 4. Additional incidental findings, as above.   Electronically Signed   By: Vinnie Langton M.D.   On: 11/14/2013 18:32   Ct Abdomen W Contrast  11/13/2013   CLINICAL DATA:  Left upper quadrant pain, leukemia, evaluate for splenic infarct.  EXAM: CT ABDOMEN WITH CONTRAST  TECHNIQUE: Multidetector CT imaging of the abdomen was performed using the standard protocol following bolus administration of intravenous contrast.  CONTRAST:  149mL OMNIPAQUE IOHEXOL 300 MG/ML  SOLN  COMPARISON:  Abdominal CT from 6 days  FINDINGS: BODY WALL: Unremarkable.  LOWER CHEST: There are new small bilateral pleural effusions with passive atelectasis that is mild. The effusions appears simple in water density.  ABDOMEN/PELVIS:  Liver: There are numerous bilateral low dense renal masses which appears circumscribed water density, consistent with cysts.  Biliary: Gallbladder is distended without inflammatory changes. No biliary ductal dilatation.  Pancreas: Unremarkable.  Spleen: Unremarkable.  Adrenals: The right adrenal gland remains enlarged the and indistinct. There is a hypervascular or high-density focus in the anterior limb on the right - which may represent residual functioning gland. This area fades on delayed imaging. Previous imaging showed no evidence of leukemic infiltration on the left. The left adrenal gland is newly enlarged and irregular, with pre renal retroperitoneal infiltration. There is no evidence of active hemorrhage. The renal veins are patent.  Kidneys and ureters: No hydronephrosis or stone.  Bowel: No obstruction. Normal appendix.  OSSEOUS: No acute abnormalities.  These results were called by telephone at the time of  interpretation on 11/13/2013 at 1:16 PM to Dr. Burney Gauze , who verbally acknowledged these results.  IMPRESSION: 1. Bilateral adrenal gland abnormality, new on the left since 11/08/2013. 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 2. New small bilateral pleural effusions.   Electronically Signed   By: Jorje Guild M.D.   On: 11/13/2013 13:19   Dg Chest Port 1 View  11/14/2013   CLINICAL DATA:  Fever, cough  EXAM: PORTABLE CHEST - 1 VIEW  COMPARISON:  Portable exam 0738 hr compared to 11/13/2013  FINDINGS: Upper normal heart size.  Again identified prominence of the right hilum question due to a combination of prominent perihilar vascular structures and loculated fluid within the minor fissure.  Component of persistent right perihilar infiltrate versus scarring in right upper lobe unchanged.  Pleural effusion blunts the right lateral costophrenic angle.  Left lung clear.  No pneumothorax.  Prior cervical spine fusion.  IMPRESSION: Persistent right upper lobe infiltrate versus scarring in perihilar region.  Right pleural effusion likely partially loculated in minor fissure.   Electronically Signed   By: Lavonia Dana M.D.   On: 11/14/2013 08:11    ASSESSMENT / PLAN:  Pleural effusion - partially loculated , favor fluid  overload rather than parapneumonic Suspected HCAP - RUL consolidation Plan -  Likely needs u/s guided thoracentesis per IR - fluid for cell count/diff, bacterial and fungal cultures, AFB, glucose, protein, LDH Will have to hold heparin x 6h prior -plan for 2/19, discussed risks &benefits, may have to wait for plt count to improve prior O2 as needed  Cont abx as above - ID following   RUE DVT  - cephalic, related to PICC ct heparin Limb elevation  Adrenal insufficiency/ adrenal infarct/hemorrhage  Pm cortisol 4 on 2/16 Plan -  Per primary  Cont stress steroids   Acute renal failure - r/t hypotension, contrast admin. Much improved with fluids. Plan -  Per primary  F/u chem     Hodgkin's lymphoma  ?CML myelodysplastic syndrome  Pancytopenia Plan -  Per heme/onc   Fever/ leucocytosis - multiple causes in my opinion -pneumonia, RUE DVT,adrenal h'age, Hypotension - resolved with IVFs, nml lactate reassuring  high pct favor infectious source -antibiotics/ antifungals per ID  WHITEHEART,KATHRYN, NP 11/15/2013  9:56 AM Pager: [phone] or [phone]  *Care during the described time interval was provided by me and/or other providers on the critical care team. I have reviewed this patient's available data, including medical history, events of note, physical examination and test results as part of my evaluation. '   Salihah Peckham V.  [phone]

## 2013-11-15 NOTE — Progress Notes (Signed)
[facility] PROGRESS NOTE  DESSIRE GRIMES HEN:[id number] DOB: 1955/10/27 DOA: 11/06/2013 PCP: Vena Austria, MD  Assessment/Plan: 1-Health Care Associated PNA; Patient presents with cough, fever. Repeat CT chest 2-17 showed: consolidation in the medial aspect of the right upper lobe. Partial loculation of the right pleural effusion anteriorly is also noted. Appreciate Dr Baxter Flattery help.  Influenza negative.  Aspergillus antigen pending.  Will discussed CT chest with ID, will consider pulmonary consult for thoracentesis.  Day 3 imipenem, day 7 Vancomycin.  Will discontinue antifungal, will discussed with ID first.   2-Soft tissue stranding in the anterior mediastinum is of uncertain etiology and significance. If there been recent attempted central line placement, this could be iatrogenic. Less likely, this might be infectious or inflammatory, and clinical correlation for signs and symptoms of mediastinitis may be warranted Will discussed with pulmonary.   3-Acute Renal Failure; in setting of hypotension, contrast. Cr peak to 2.0. Improving with IV fluids. Cr has decrease to 1.6.   4-Adrenal Insufficiency; Adrenal infarct/hemorrhage; Continue with Hydrocortisone.   5-Hypotension ? Sepsis ?; Hypotension could be explain by adrenal insufficiency but in setting of PNA, leukocytosis, fever sepsis is higly in the differential. Continue with IV fluids, IV antibiotics.  Pro-calcitoni 175. Urine culture no growth, blood culture; no growth top date.   6-Anemia; History of Hodgkin Lymphoma: Appreciate Dr Jonette Eva assistance. LDH 574. - Bone Marrow Biopsy: HYPERCELLULAR BONE MARROW FOR AGE WITH DYSPOIETIC CHANGES. Hb at 7.9.   7-Right upper extremity superficial thrombosis; On heparin Gtt due to concern for hypercoagulable state. Will follow hematologist recommendation for anticoagulation.   8-Pyelonephritis; unlikely. Urine culture no growth to date.   9-Skin eruptions  -Resolving.  Etiology unclear however if infectious should be covered by broad-spectrum antibiotic  10-Mouth Ulcer; Continue with Acyclovir. Herpes virus culture pending.   Code Status: Full Code.  Family Communication: Disposition Plan: Remain step down.    Consultants: Dr. Burney Gauze (oncology)  Dr Gibson Ramp (Surgery) curbside consult  Dr. Carlyle Basques (infectious disease)   Procedures: 11/12/2013 right upper extremity Doppler  No evidence of deep vein thrombosis involving the right upper extremity, left subclavian vein, and left internal jugular vein. There is superficial thrombosis noted in the right cephalic vein.   -3/61 Bone marrow biopsy report pending  Echocardiogram 01/10/2013  - Left ventricle: mild focal basal hypertrophy of the septum.  -LVEF= ejection fraction was 55%. Wall motion was normal; there were no regional wall motion abnormalities.  -(grade 1 diastolic dysfunction).  - Aortic valve: There was no stenosis. Trivial regurgitation. - Mitral valve: Trivial regurgitation. - Left atrium: The atrium was mildly dilated. - Right ventricle: The cavity size was normal. Systolic function was normal. - Tricuspid valve: Peak RV-RA gradient: 18mm Hg (S). - Pulmonary arteries: PA peak pressure: 49mm Hg (S). - Inferior vena cava: The vessel was normal in size; the respirophasic diameter changes were in the normal range (=50%); findings are consistent with normal central venous pressure.    Antibiotics: Aztreonam 2/11>> stopped 2/13  Ciprofloxacin 2/11>> stopped 2/15  Vancomycin 2/11>> stopped 2/15  Primaxin>> 2/16>>  Voriconazole 2/16>> stopped 2/18 Vancomycin 2/17>>   HPI/Subjective: Feeling better, cough has improved. No significant dyspnea.   Objective: Filed Vitals:   11/15/13 0630  BP:   Pulse: 115  Temp: 99.9 F (37.7 C)  Resp: 17    Intake/Output Summary (Last 24 hours) at 11/15/13 0721 Last data filed at 11/15/13 4431  Gross per 24 hour  Intake  5012.23 ml  Output  1650 ml  Net 3362.23 ml   Filed Weights   11/13/13 0452 11/14/13 0500 11/15/13 0500  Weight: 89.3 kg (196 lb 13.9 oz) 96.6 kg (212 lb 15.4 oz) 101.6 kg (223 lb 15.8 oz)    Exam:   General:  No distress.   Cardiovascular: S 1, S 2 RRR  Respiratory: decrease breath sounds on the right, no wheezes, no crackles.   Abdomen: Bs present, soft, NT  Musculoskeletal: right arm with swelling , redness, pulse present.   Data Reviewed: Basic Metabolic Panel:  Recent Labs Lab 11/12/13 0505 11/13/13 0445 11/13/13 2313 11/14/13 0435 11/15/13 0508  NA 139 139 139 139 143  K 3.5* 3.3* 3.2* 3.9 3.2*  CL 101 100 104 107 111  CO2 $Re'25 26 24 22 'bzr$ 18*  GLUCOSE 143* 126* 87 95 163*  BUN $Re'15 12 16 18 18  'XAA$ CREATININE 1.12* 1.06 2.10* 1.90* 1.61*  CALCIUM 8.5 8.3* 7.8* 7.1* 7.8*  MG 1.7 1.7 1.6 1.6 1.9  PHOS  --   --  4.0  --   --    Liver Function Tests:  Recent Labs Lab 11/11/13 0507 11/12/13 0505 11/13/13 0445 11/14/13 0435 11/15/13 0508  AST $Re'15 15 16 'oRG$ 34 40*  ALT $Re'17 19 17 20 26  'sfw$ ALKPHOS 68 66 70 64 70  BILITOT 0.4 0.5 0.6 0.3 0.3  PROT 7.0 6.8 6.9 5.6* 6.3  ALBUMIN 2.3* 2.4* 2.3* 1.7* 1.8*   No results found for this basename: LIPASE, AMYLASE,  in the last 168 hours No results found for this basename: AMMONIA,  in the last 168 hours CBC:  Recent Labs Lab 11/11/13 0507 11/12/13 0505 11/13/13 0445 11/14/13 0435 11/14/13 0840 11/15/13 0508  WBC 17.5* 22.3* 21.5* 21.0*  --  19.2*  NEUTROABS 7.9* 11.8* 16.8* 17.4*  --  16.1*  HGB 10.4* 9.7* 10.1* 8.5*  --  7.9*  HCT 31.0* 29.4* 30.4* 27.0*  --  24.9*  MCV 93.1 93.9 93.8 96.8  --  94.3  PLT 81* 69* 63* PLATELET CLUMPS NOTED ON SMEAR, COUNT APPEARS DECREASED 46* PLATELET CLUMPS NOTED ON SMEAR, UNABLE TO ESTIMATE   Cardiac Enzymes:  Recent Labs Lab 11/14/13 1040 11/14/13 1635 11/14/13 2140  TROPONINI <0.30 <0.30 <0.30   BNP (last 3 results)  Recent Labs  11/14/13 0435  PROBNP 2977.0*    CBG: No results found for this basename: GLUCAP,  in the last 168 hours  Recent Results (from the past 240 hour(s))  CULTURE, BLOOD (ROUTINE X 2)     Status: None   Collection Time    11/08/13 12:40 AM      Result Value Ref Range Status   Specimen Description BLOOD LEFT ANTECUBITAL   Final   Special Requests BOTTLES DRAWN AEROBIC AND ANAEROBIC 3CC   Final   Culture  Setup Time     Final   Value: 11/08/2013 03:32     Performed at [facility]   Culture     Final   Value: NO GROWTH 5 DAYS     Performed at [facility]   Report Status 11/14/2013 FINAL   Final  CULTURE, BLOOD (ROUTINE X 2)     Status: None   Collection Time    11/08/13 12:45 AM      Result Value Ref Range Status   Specimen Description BLOOD LEFT HAND   Final   Special Requests BOTTLES DRAWN AEROBIC AND ANAEROBIC 3CC   Final   Culture  Setup Time  Final   Value: 11/08/2013 03:31     Performed at [facility]   Culture     Final   Value: NO GROWTH 5 DAYS     Performed at [facility]   Report Status 11/14/2013 FINAL   Final  RESPIRATORY VIRUS PANEL     Status: None   Collection Time    11/11/13 12:00 AM      Result Value Ref Range Status   Source - RVPAN NASOPHARYNGEAL   Final   Respiratory Syncytial Virus A NOT DETECTED   Final   Respiratory Syncytial Virus B NOT DETECTED   Final   Influenza A NOT DETECTED   Final   Influenza B NOT DETECTED   Final   Parainfluenza 1 NOT DETECTED   Final   Parainfluenza 2 NOT DETECTED   Final   Parainfluenza 3 NOT DETECTED   Final   Metapneumovirus NOT DETECTED   Final   Rhinovirus NOT DETECTED   Final   Adenovirus NOT DETECTED   Final   Influenza A H1 NOT DETECTED   Final   Influenza A H3 NOT DETECTED   Final   Comment: (NOTE)           Normal Reference Range for each Analyte: NOT DETECTED     Testing performed using the Luminex xTAG Respiratory Viral Panel test     kit.     This test was developed and its performance  characteristics determined     by [facility]. It has not been cleared or approved by the [place]     Food and Drug Administration. This test is used for clinical purposes.     It should not be regarded as investigational or for research. This     laboratory is certified under the [place] (CLIA) as qualified to perform high complexity     clinical laboratory testing.     Performed at [place], BLOOD (ROUTINE X 2)     Status: None   Collection Time    11/13/13  6:20 AM      Result Value Ref Range Status   Specimen Description BLOOD LEFT HAND   Final   Special Requests     Final   Value: BOTTLES DRAWN AEROBIC AND ANAEROBIC [place] AER 3CC ANA   Culture  Setup Time     Final   Value: 11/13/2013 08:35     Performed at [facility]   Culture     Final   Value:        BLOOD CULTURE RECEIVED NO GROWTH TO DATE CULTURE WILL BE HELD FOR 5 DAYS BEFORE ISSUING A FINAL NEGATIVE REPORT     Performed at [facility]   Report Status PENDING   Incomplete  CULTURE, BLOOD (ROUTINE X 2)     Status: None   Collection Time    11/13/13  6:35 AM      Result Value Ref Range Status   Specimen Description BLOOD LEFT HAND   Final   Special Requests     Final   Value: BOTTLES DRAWN AEROBIC AND ANAEROBIC [place] AER 3CC ANA   Culture  Setup Time     Final   Value: 11/13/2013 08:35     Performed at [facility]   Culture     Final   Value:        BLOOD CULTURE RECEIVED NO GROWTH TO  DATE CULTURE WILL BE HELD FOR 5 DAYS BEFORE ISSUING A FINAL NEGATIVE REPORT     Performed at [facility]   Report Status PENDING   Incomplete  MRSA PCR SCREENING     Status: None   Collection Time    11/13/13  6:04 PM      Result Value Ref Range Status   MRSA by PCR NEGATIVE  NEGATIVE Final   Comment:            The GeneXpert MRSA Assay (FDA     approved for NASAL specimens     only), is one component of a     comprehensive MRSA  colonization     surveillance program. It is not     intended to diagnose MRSA     infection nor to guide or     monitor treatment for     MRSA infections.  URINE CULTURE     Status: None   Collection Time    11/13/13  8:55 PM      Result Value Ref Range Status   Specimen Description URINE, CATHETERIZED   Final   Special Requests NONE   Final   Culture  Setup Time     Final   Value: 11/14/2013 01:07     Performed at [facility] Count     Final   Value: NO GROWTH     Performed at [facility]   Culture     Final   Value: NO GROWTH     Performed at [facility]   Report Status 11/14/2013 FINAL   Final  CULTURE, EXPECTORATED SPUTUM-ASSESSMENT     Status: None   Collection Time    11/14/13  4:36 PM      Result Value Ref Range Status   Specimen Description SPUTUM   Final   Special Requests Immunocompromised   Final   Sputum evaluation     Final   Value: THIS SPECIMEN IS ACCEPTABLE. RESPIRATORY CULTURE REPORT TO FOLLOW.   Report Status 11/14/2013 FINAL   Final     Studies: Ct Chest Wo Contrast  11/14/2013   CLINICAL DATA:  Fever.  Shortness of breath.  Abnormal chest x-ray.  EXAM: CT CHEST WITHOUT CONTRAST  TECHNIQUE: Multidetector CT imaging of the chest was performed following the standard protocol without IV contrast.  COMPARISON:  Chest CT 11/04/2013.  FINDINGS: Mediastinum: Left upper extremity PICC with tip terminating in the superior aspect of the right atrium. Heart size is mildly enlarged. There is no significant pericardial fluid, thickening or pericardial calcification. Soft tissue stranding in the anterior mediastinum is new compared to the prior study. Numerous reactive size mediastinal lymph nodes are noted the middle mediastinum. Esophagus is unremarkable in appearance.  Lungs/Pleura: Compared to the prior study there are new small a moderate bilateral pleural effusions (right greater than left). There is some new atelectasis and/or  consolidation in the medial aspect of the right upper lobe which accounts for the perceived abnormality on the recent chest radiograph, as well as some adjacent anteriorly loculated right effusion superiorly. There also areas of mild passive atelectasis in the lower lobes of the lungs bilaterally. No suspicious appearing pulmonary nodules or masses are identified. Right perihilar architectural distortion similar to the prior study appears to be chronic.  Upper Abdomen: Low-attenuation hepatic lesions again noted, largest of which is in the central aspect of segments 7 and 8 measuring 3.1 x 2.6 cm. Left adrenal gland appears  enlarged and demonstrates some internal high attenuation, which may suggest and adrenal infarct, similar to the recent prior study.  Musculoskeletal: Multiple prominent borderline enlarged right axillary lymph nodes, largest of which measures 9 mm. There are no aggressive appearing lytic or blastic lesions noted in the visualized portions of the skeleton. Orthopedic fixation hardware in the lower cervical spine.  IMPRESSION: 1. Interval development of small to moderate bilateral pleural effusions (right greater than left) with some atelectasis and/or consolidation in the medial aspect of the right upper lobe which accounts for the perceived abnormality on the recent chest radiograph. Clinical correlation for signs and symptoms of right upper lobe pneumonia is recommended. This could represent infection with typical or atypical organisms, including fungal pneumonia in this immunocompromised host. Partial loculation of the right pleural effusion anteriorly is also noted. 2. Soft tissue stranding in the anterior mediastinum is of uncertain etiology and significance. If there been recent attempted central line placement, this could be iatrogenic. Less likely, this might be infectious or inflammatory, and clinical correlation for signs and symptoms of mediastinitis may be warranted. 3. Enlargement and  high attenuation in the left adrenal gland, similar to the prior study, concerning for recent adrenal hemorrhage. 4. Additional incidental findings, as above.   Electronically Signed   By: Vinnie Langton M.D.   On: 11/14/2013 18:32   Ct Abdomen W Contrast  11/13/2013   CLINICAL DATA:  Left upper quadrant pain, leukemia, evaluate for splenic infarct.  EXAM: CT ABDOMEN WITH CONTRAST  TECHNIQUE: Multidetector CT imaging of the abdomen was performed using the standard protocol following bolus administration of intravenous contrast.  CONTRAST:  149mL OMNIPAQUE IOHEXOL 300 MG/ML  SOLN  COMPARISON:  Abdominal CT from 6 days  FINDINGS: BODY WALL: Unremarkable.  LOWER CHEST: There are new small bilateral pleural effusions with passive atelectasis that is mild. The effusions appears simple in water density.  ABDOMEN/PELVIS:  Liver: There are numerous bilateral low dense renal masses which appears circumscribed water density, consistent with cysts.  Biliary: Gallbladder is distended without inflammatory changes. No biliary ductal dilatation.  Pancreas: Unremarkable.  Spleen: Unremarkable.  Adrenals: The right adrenal gland remains enlarged the and indistinct. There is a hypervascular or high-density focus in the anterior limb on the right - which may represent residual functioning gland. This area fades on delayed imaging. Previous imaging showed no evidence of leukemic infiltration on the left. The left adrenal gland is newly enlarged and irregular, with pre renal retroperitoneal infiltration. There is no evidence of active hemorrhage. The renal veins are patent.  Kidneys and ureters: No hydronephrosis or stone.  Bowel: No obstruction. Normal appendix.  OSSEOUS: No acute abnormalities.  These results were called by telephone at the time of interpretation on 11/13/2013 at 1:16 PM to Dr. Burney Gauze , who verbally acknowledged these results.  IMPRESSION: 1. Bilateral adrenal gland abnormality, new on the left since  11/08/2013. 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 2. New small bilateral pleural effusions.   Electronically Signed   By: Jorje Guild M.D.   On: 11/13/2013 13:19   Dg Chest Port 1 View  11/14/2013   CLINICAL DATA:  Fever, cough  EXAM: PORTABLE CHEST - 1 VIEW  COMPARISON:  Portable exam 0738 hr compared to 11/13/2013  FINDINGS: Upper normal heart size.  Again identified prominence of the right hilum question due to a combination of prominent perihilar vascular structures and loculated fluid within the minor fissure.  Component of persistent right perihilar infiltrate versus scarring in right upper lobe  unchanged.  Pleural effusion blunts the right lateral costophrenic angle.  Left lung clear.  No pneumothorax.  Prior cervical spine fusion.  IMPRESSION: Persistent right upper lobe infiltrate versus scarring in perihilar region.  Right pleural effusion likely partially loculated in minor fissure.   Electronically Signed   By: Lavonia Dana M.D.   On: 11/14/2013 08:11    Scheduled Meds: . acyclovir  5 mg/kg (Ideal) Intravenous Q12H  . dextromethorphan-guaiFENesin  1 tablet Oral q morning - 10a  . DULoxetine  60 mg Oral QPM  . fluticasone  2 spray Each Nare Daily  . gabapentin  600 mg Oral BID  . hydrocortisone sodium succinate  100 mg Intravenous Q8H  . imipenem-cilastatin  250 mg Intravenous 4 times per day  . ipratropium-albuterol  3 mL Nebulization QID  . magic mouthwash w/lidocaine  10 mL Oral Q4H  . sodium chloride  10-40 mL Intracatheter Q12H  . sodium chloride  3 mL Intravenous Q12H  . vancomycin  1,250 mg Intravenous Q24H  . voriconazole (VFEND - PT WT > 85 kg) IV  4 mg/kg Intravenous Q12H   Continuous Infusions: . sodium chloride 100 mL/hr at 11/15/13 0630  . heparin 1,700 Units/hr (11/15/13 4446)    Principal Problem:   Pyelonephritis Active Problems:   Hodgkin lymphoma   Myelodysplasia   Adrenal abnormality   Rash and nonspecific skin eruption   Nasal swelling   Acute  bronchitis   Chest tightness   DOE (dyspnea on exertion)   Symptomatic anemia   Melena   Adrenal infarction   Hypotension, unspecified    Time spent: 35 minutes.     REGALADO,BELKYS  [facility] Pager [phone]. If 7PM-7AM, please contact night-coverage at [email or web address], password [facility] 11/15/2013, 7:21 AM  LOS: 8 days

## 2013-11-15 NOTE — Progress Notes (Signed)
[id number] Rosana Hoes, RN, BSN, CCM, [phone] Chart reviewed for update of needs and condition patient continues to show elevated wbc -19.2, hgb7.9,poss thoracentesis  for pl effusion, main issue continues to be hcapna.temp 100 /teachycardia at 123/rr-27/s 02 sats at 100% .  Being followed buy medical and infectious diease.

## 2013-11-15 NOTE — Progress Notes (Signed)
Brief Pharmacy note: Heparin infusion for VTE treatment  Heparin @ 1700 units/hr, level at 1900 = 0.20, goal range 0.3-0.7 units/ml  Increase Heparin rate to 1900 units/hr, recheck level in am  Thank you,  Minda Ditto PharmD Pager [phone] 11/15/2013, 8:21 PM

## 2013-11-15 NOTE — Progress Notes (Signed)
ANTICOAGULATION CONSULT NOTE - F/U Consult  Pharmacy Consult for Heparin Indication: VTE treatment  Allergies  Allergen Reactions  . Penicillins Hives and Rash    Patient Measurements: Height: 5' (152.4 cm) Weight: 223 lb 15.8 oz (101.6 kg) IBW/kg (Calculated) : 45.5 Heparin Dosing Weight: 69 kg  Vital Signs: Temp: 99.9 F (37.7 C) (02/18 0546) Temp src: Core (Comment) (02/18 0400) Pulse Rate: 116 (02/18 0546)  Labs:  Recent Labs  11/13/13 0445 11/13/13 2313 11/14/13 0435 11/14/13 0840 11/14/13 1040 11/14/13 1635 11/14/13 1900 11/14/13 2140 11/15/13 0508  HGB 10.1*  --  8.5*  --   --   --   --   --  7.9*  HCT 30.4*  --  27.0*  --   --   --   --   --  24.9*  PLT 63*  --  PLATELET CLUMPS NOTED ON SMEAR, COUNT APPEARS DECREASED 46*  --   --   --   --  PENDING  HEPARINUNFRC  --   --   --   --   --   --  <0.10*  --  0.13*  CREATININE 1.06 2.10* 1.90*  --   --   --   --   --  1.61*  TROPONINI  --   --   --   --  <0.30 <0.30  --  <0.30  --     Estimated Creatinine Clearance: 40.3 ml/min (by C-G formula based on Cr of 1.61).   Medical History: Past Medical History  Diagnosis Date  . Cancer   . Cough productive of clear sputum 01/04/2013  . Fibromyalgia     Medications:  Scheduled:  . acyclovir  5 mg/kg (Ideal) Intravenous Q12H  . dextromethorphan-guaiFENesin  1 tablet Oral q morning - 10a  . DULoxetine  60 mg Oral QPM  . fluticasone  2 spray Each Nare Daily  . gabapentin  600 mg Oral BID  . heparin  2,000 Units Intravenous Once  . hydrocortisone sodium succinate  100 mg Intravenous Q8H  . imipenem-cilastatin  250 mg Intravenous 4 times per day  . ipratropium-albuterol  3 mL Nebulization QID  . magic mouthwash w/lidocaine  10 mL Oral Q4H  . sodium chloride  10-40 mL Intracatheter Q12H  . sodium chloride  3 mL Intravenous Q12H  . vancomycin  1,250 mg Intravenous Q24H  . voriconazole (VFEND - PT WT > 85 kg) IV  4 mg/kg Intravenous Q12H   Infusions:  . sodium  chloride 100 mL/hr at 11/14/13 1959  . heparin      Assessment: 88 yoF admitted 2/10 with N/V, abdominal pain, fever, and melena.  PMH includes Hodgkin's lymphoma, s/p chemo, and myelodysplastic syndrome. Doppler completed 2/15 and shows no DVT, but noted superficial thrombus in the right cephalic vein from [facility] to mid upper arm.  Pharmacy consulted to dose IV Heparin for thrombus and MD suspects hypercoagulable status.  HL=0.13 after 2000 rebolus and drip increase to 1300 units/hr  No active bleeding reported.  Goal of Therapy:  Heparin level 0.3-0.7 units/ml Monitor platelets by anticoagulation protocol: Yes   Plan:   Rebolus w/ 2000 units IV heparin  Increase drip to 1700 units/hr  Recheck HL in 6 hours  Daily heparin level and CBC  Continue to monitor H&H and platelets  Dorrene German 11/15/2013 6:19 AM

## 2013-11-16 ENCOUNTER — Inpatient Hospital Stay (HOSPITAL_COMMUNITY): Payer: BC Managed Care – PPO

## 2013-11-16 DIAGNOSIS — J189 Pneumonia, unspecified organism: Secondary | ICD-10-CM

## 2013-11-16 DIAGNOSIS — N179 Acute kidney failure, unspecified: Secondary | ICD-10-CM

## 2013-11-16 DIAGNOSIS — J90 Pleural effusion, not elsewhere classified: Secondary | ICD-10-CM

## 2013-11-16 DIAGNOSIS — E2749 Other adrenocortical insufficiency: Secondary | ICD-10-CM

## 2013-11-16 LAB — LACTATE DEHYDROGENASE: LDH: 737 U/L — ABNORMAL HIGH (ref 94–250)

## 2013-11-16 LAB — CBC WITH DIFFERENTIAL/PLATELET
Basophils Absolute: 0.2 10*3/uL — ABNORMAL HIGH (ref 0.0–0.1)
Basophils Relative: 1 % (ref 0–1)
Eosinophils Absolute: 0 10*3/uL (ref 0.0–0.7)
Eosinophils Relative: 0 % (ref 0–5)
HEMATOCRIT: 22.2 % — AB (ref 36.0–46.0)
Hemoglobin: 7.2 g/dL — ABNORMAL LOW (ref 12.0–15.0)
LYMPHS PCT: 10 % — AB (ref 12–46)
Lymphs Abs: 2 10*3/uL (ref 0.7–4.0)
MCH: 30.9 pg (ref 26.0–34.0)
MCHC: 32.4 g/dL (ref 30.0–36.0)
MCV: 95.3 fL (ref 78.0–100.0)
MONOS PCT: 9 % (ref 3–12)
Monocytes Absolute: 1.8 10*3/uL — ABNORMAL HIGH (ref 0.1–1.0)
NEUTROS PCT: 80 % — AB (ref 43–77)
Neutro Abs: 15.7 10*3/uL — ABNORMAL HIGH (ref 1.7–7.7)
Platelets: 48 10*3/uL — ABNORMAL LOW (ref 150–400)
RBC: 2.33 MIL/uL — ABNORMAL LOW (ref 3.87–5.11)
RDW: 20 % — AB (ref 11.5–15.5)
WBC: 19.7 10*3/uL — ABNORMAL HIGH (ref 4.0–10.5)

## 2013-11-16 LAB — HERPES SIMPLEX VIRUS CULTURE: Culture: NOT DETECTED

## 2013-11-16 LAB — COMPREHENSIVE METABOLIC PANEL
ALBUMIN: 1.9 g/dL — AB (ref 3.5–5.2)
ALK PHOS: 68 U/L (ref 39–117)
ALT: 22 U/L (ref 0–35)
AST: 24 U/L (ref 0–37)
BUN: 22 mg/dL (ref 6–23)
CALCIUM: 8.2 mg/dL — AB (ref 8.4–10.5)
CO2: 18 mEq/L — ABNORMAL LOW (ref 19–32)
Chloride: 112 mEq/L (ref 96–112)
Creatinine, Ser: 1.57 mg/dL — ABNORMAL HIGH (ref 0.50–1.10)
GFR calc Af Amer: 41 mL/min — ABNORMAL LOW (ref >90)
GFR calc non Af Amer: 35 mL/min — ABNORMAL LOW (ref >90)
Glucose, Bld: 141 mg/dL — ABNORMAL HIGH (ref 70–99)
POTASSIUM: 3 meq/L — AB (ref 3.7–5.3)
Sodium: 146 mEq/L (ref 137–147)
TOTAL PROTEIN: 6.3 g/dL (ref 6.0–8.3)
Total Bilirubin: 0.6 mg/dL (ref 0.3–1.2)

## 2013-11-16 LAB — RETICULOCYTES
RBC.: 2.33 MIL/uL — ABNORMAL LOW (ref 3.87–5.11)
RETIC COUNT ABSOLUTE: 18.6 10*3/uL — AB (ref 19.0–186.0)
Retic Ct Pct: 0.8 % (ref 0.4–3.1)

## 2013-11-16 LAB — HEPARIN LEVEL (UNFRACTIONATED)
HEPARIN UNFRACTIONATED: 0.56 [IU]/mL (ref 0.30–0.70)
Heparin Unfractionated: <0.1 IU/mL — ABNORMAL LOW (ref 0.30–0.70)
Heparin Unfractionated: 0.34 IU/mL (ref 0.30–0.70)

## 2013-11-16 LAB — MAGNESIUM: MAGNESIUM: 1.8 mg/dL (ref 1.5–2.5)

## 2013-11-16 MED ORDER — SENNOSIDES-DOCUSATE SODIUM 8.6-50 MG PO TABS
1.0000 | ORAL_TABLET | Freq: Two times a day (BID) | ORAL | Status: DC
  Administered 2013-11-16 (×2): 1 via ORAL
  Filled 2013-11-16 (×4): qty 1

## 2013-11-16 MED ORDER — SODIUM CHLORIDE 0.9 % IV SOLN
500.0000 mg | Freq: Three times a day (TID) | INTRAVENOUS | Status: DC
  Administered 2013-11-16 – 2013-11-18 (×6): 500 mg via INTRAVENOUS
  Filled 2013-11-16 (×7): qty 500

## 2013-11-16 MED ORDER — POTASSIUM CHLORIDE 10 MEQ/100ML IV SOLN
10.0000 meq | INTRAVENOUS | Status: AC
  Administered 2013-11-16 (×3): 10 meq via INTRAVENOUS

## 2013-11-16 MED ORDER — POTASSIUM CHLORIDE 10 MEQ/100ML IV SOLN
10.0000 meq | INTRAVENOUS | Status: AC
  Filled 2013-11-16 (×3): qty 100

## 2013-11-16 MED ORDER — CALCIUM CARBONATE ANTACID 500 MG PO CHEW
1.0000 | CHEWABLE_TABLET | Freq: Three times a day (TID) | ORAL | Status: DC | PRN
  Administered 2013-11-16 – 2013-11-17 (×2): 200 mg via ORAL
  Filled 2013-11-16 (×2): qty 1

## 2013-11-16 MED ORDER — ALTEPLASE 2 MG IJ SOLR
2.0000 mg | Freq: Once | INTRAMUSCULAR | Status: AC
  Administered 2013-11-16: 2 mg
  Filled 2013-11-16: qty 2

## 2013-11-16 MED ORDER — HEPARIN (PORCINE) IN NACL 100-0.45 UNIT/ML-% IJ SOLN
2000.0000 [IU]/h | INTRAMUSCULAR | Status: DC
  Administered 2013-11-16: 1900 [IU]/h via INTRAVENOUS
  Administered 2013-11-17: 2000 [IU]/h via INTRAVENOUS
  Filled 2013-11-16 (×3): qty 250

## 2013-11-16 NOTE — Progress Notes (Signed)
ANTICOAGULATION CONSULT NOTE - Follow Up Consult  Pharmacy Consult for Heparin Indication: VTE treatment  Allergies  Allergen Reactions  . Penicillins Hives and Rash    Patient Measurements: Height: 5' (152.4 cm) Weight: 227 lb 15.3 oz (103.4 kg) IBW/kg (Calculated) : 45.5 Heparin Dosing Weight: 69 kg  Vital Signs: Temp: 100.2 F (37.9 C) (02/19 2000) Temp src: Core (Comment) (02/19 2000) BP: 131/101 mmHg (02/19 2000) Pulse Rate: 119 (02/19 2000)  Labs:  Recent Labs  11/14/13 0435 11/14/13 0840 11/14/13 1040 11/14/13 1635  11/14/13 2140 11/15/13 0508  11/16/13 0524 11/16/13 1200 11/16/13 2000  HGB 8.5*  --   --   --   --   --  7.9*  --  7.2*  --   --   HCT 27.0*  --   --   --   --   --  24.9*  --  22.2*  --   --   PLT PLATELET CLUMPS NOTED ON SMEAR, COUNT APPEARS DECREASED 46*  --   --   --   --  PLATELET CLUMPS NOTED ON SMEAR, UNABLE TO ESTIMATE  --  48*  --   --   HEPARINUNFRC  --   --   --   --   < >  --  0.13*  < > 0.56 <0.10* 0.34  CREATININE 1.90*  --   --   --   --   --  1.61*  --  1.57*  --   --   TROPONINI  --   --  <0.30 <0.30  --  <0.30  --   --   --   --   --   < > = values in this interval not displayed.  Estimated Creatinine Clearance: 41.8 ml/min (by C-G formula based on Cr of 1.57).   Medications:  Infusions:  . sodium chloride 100 mL/hr at 11/16/13 0508  . heparin 1,900 Units/hr (11/16/13 1226)    Assessment: 45 yoF admitted 2/10 with N/V, abdominal pain, fever, and melena. PMH includes Hodgkin's lymphoma, s/p chemo, and myelodysplastic syndrome.  Doppler completed 2/15 and shows no DVT, but noted superficial thrombus in the right cephalic vein from [facility] to mid upper arm. Pharmacy consulted to dose IV Heparin for thrombus and MD suspects hypercoagulable status.   Heparin turned off today 8am-noon for thoracentesis, procedure was not performed, heparin resumed at noon at 1900 units/hr  CBC: Hgb decreasing (8.5 --> 7.9 --> 7.2), Plt clumped, but  Plt = 48 on 2/19.   MD is aware of thrombocytopenia.   Pt received Aransep injection on 11/09/13 and 2 units PRBC on 2/13.  No active bleeding reported.  Rn reports no complications.  1st check heparin level just inside therapeutic range at 0.34 on 19000 units/h   Goal of Therapy:  Heparin level 0.3-0.7 units/ml Monitor platelets by anticoagulation protocol: Yes   Plan:   Increase heparin IV infusion to 2000 units/hr  Heparin level in 6 hours to confirm therapeutic level which will be with AM labs  Daily heparin level and CBC  Continue to monitor H&H and platelets closely with known anemia and thrombocytopenia.  Thank you for the consult.  Johny Drilling, PharmD, BCPS Pager: [phone] Pharmacy: [phone] 11/16/2013 9:03 PM

## 2013-11-16 NOTE — Progress Notes (Signed)
Name: Lacey [name] MRN: [id number] DOB: 09-26-1956    ADMISSION DATE:  12/06/13 CONSULTATION DATE:  11/15/13  REFERRING MD :  Tyrell Antonio  PRIMARY SERVICE:  [facility]  CHIEF COMPLAINT:  Pleural effusion   BRIEF PATIENT DESCRIPTION: 58yo female with hx Hodgkins lymphoma s/p chemo & auto BMT in may 2014, myelodysplastic syndrome admitted 2/10 with flu like symptoms after an 8 [place],  Labs & XR  C/w pyelonephritis, purulent bronchitis and anemia.  Treated with abx and supportive care.  CT chest r/o PE, abd CT showed L adrenal infarct.  BM bx thought c/w CMML.  Infectious w/u remains negative.  On 2/17 developed worsening cough and repeat CT chest showed loculated R>L effusions and PCCM consulted.   SIGNIFICANT EVENTS / STUDIES:  CT abd/pelvis>>> . Bilateral adrenal gland abnormality, new on the left since 12-06-13. 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 2. New small bilateral pleural effusions. CT chest 2/17>>> Partially loculated moderate R>L pleural effusions, RUL consolidation, L adrenal enlargement   LINES / TUBES: L Rad aline>>>  CULTURES: Sputum 2/17>>>oral flora Urine 2/17>>>neg  BCx2 2/11>>>neg  RVP>>> NEG  ANTIBIOTICS: Acyclovir 2/17>>> Imipenem 2/17>>> Vanc 2/17>>>  SUBj - afebrile No dyspnea BP better  VITAL SIGNS: Temp:  [99 F (37.2 C)-100.4 F (38 C)] 99.1 F (37.3 C) (02/19 0630) Pulse Rate:  [109-131] 109 (02/19 0530) Resp:  [11-29] 14 (02/19 0630) BP: (140-154)/(64-75) 140/75 mmHg (02/19 1030) SpO2:  [88 %-100 %] 94 % (02/19 0830) Weight:  [103.4 kg (227 lb 15.3 oz)] 103.4 kg (227 lb 15.3 oz) (02/19 0500)  PHYSICAL EXAMINATION: General:  Chronically ill appearing female, NAD  Neuro:  Awake, alert, MAE HEENT:  Mm moist, no JVD Cardiovascular:  s1s2 rrr Lungs:  resps even non labored on [place], diminished bases R>L, few scattered rhonchi  Abdomen:  Soft, +bs Musculoskeletal:  Warm and dry, scant BLE edema   Recent Labs Lab  11/14/13 0435 11/15/13 0508 11/16/13 0524  NA 139 143 146  K 3.9 3.2* 3.0*  CL 107 111 112  CO2 22 18* 18*  BUN 18 18 22   CREATININE 1.90* 1.61* 1.57*  GLUCOSE 95 163* 141*    Recent Labs Lab 11/14/13 0435 11/14/13 0840 11/15/13 0508 11/16/13 0524  HGB 8.5*  --  7.9* 7.2*  HCT 27.0*  --  24.9* 22.2*  WBC 21.0*  --  19.2* 19.7*  PLT PLATELET CLUMPS NOTED ON SMEAR, COUNT APPEARS DECREASED 46* PLATELET CLUMPS NOTED ON SMEAR, UNABLE TO ESTIMATE 48*   Ct Chest Wo Contrast  11/14/2013   CLINICAL DATA:  Fever.  Shortness of breath.  Abnormal chest x-ray.  EXAM: CT CHEST WITHOUT CONTRAST  TECHNIQUE: Multidetector CT imaging of the chest was performed following the standard protocol without IV contrast.  COMPARISON:  Chest CT 12-06-2013.  FINDINGS: Mediastinum: Left upper extremity PICC with tip terminating in the superior aspect of the right atrium. Heart size is mildly enlarged. There is no significant pericardial fluid, thickening or pericardial calcification. Soft tissue stranding in the anterior mediastinum is new compared to the prior study. Numerous reactive size mediastinal lymph nodes are noted the middle mediastinum. Esophagus is unremarkable in appearance.  Lungs/Pleura: Compared to the prior study there are new small a moderate bilateral pleural effusions (right greater than left). There is some new atelectasis and/or consolidation in the medial aspect of the right upper lobe which accounts for the perceived abnormality on the recent chest radiograph, as well as some adjacent anteriorly loculated right effusion superiorly. There  also areas of mild passive atelectasis in the lower lobes of the lungs bilaterally. No suspicious appearing pulmonary nodules or masses are identified. Right perihilar architectural distortion similar to the prior study appears to be chronic.  Upper Abdomen: Low-attenuation hepatic lesions again noted, largest of which is in the central aspect of segments 7 and 8  measuring 3.1 x 2.6 cm. Left adrenal gland appears enlarged and demonstrates some internal high attenuation, which may suggest and adrenal infarct, similar to the recent prior study.  Musculoskeletal: Multiple prominent borderline enlarged right axillary lymph nodes, largest of which measures 9 mm. There are no aggressive appearing lytic or blastic lesions noted in the visualized portions of the skeleton. Orthopedic fixation hardware in the lower cervical spine.  IMPRESSION: 1. Interval development of small to moderate bilateral pleural effusions (right greater than left) with some atelectasis and/or consolidation in the medial aspect of the right upper lobe which accounts for the perceived abnormality on the recent chest radiograph. Clinical correlation for signs and symptoms of right upper lobe pneumonia is recommended. This could represent infection with typical or atypical organisms, including fungal pneumonia in this immunocompromised host. Partial loculation of the right pleural effusion anteriorly is also noted. 2. Soft tissue stranding in the anterior mediastinum is of uncertain etiology and significance. If there been recent attempted central line placement, this could be iatrogenic. Less likely, this might be infectious or inflammatory, and clinical correlation for signs and symptoms of mediastinitis may be warranted. 3. Enlargement and high attenuation in the left adrenal gland, similar to the prior study, concerning for recent adrenal hemorrhage. 4. Additional incidental findings, as above.   Electronically Signed   By: Vinnie Langton M.D.   On: 11/14/2013 18:32    ASSESSMENT / PLAN:  Pleural effusion - partially loculated , favor fluid overload rather than parapneumonic Suspected HCAP - RUL consolidation Plan -  Given immunocompromised host, u/s guided thoracentesis per IR - fluid for cell count/diff, bacterial and fungal cultures, AFB, glucose, protein, LDH Will have to hold heparin x 6h  prior - plt transfusion planned prior O2 as needed  Cont abx as above - ID following   RUE DVT  - cephalic, related to PICC ct heparin Limb elevation  Adrenal insufficiency/ adrenal infarct/hemorrhage  Pm cortisol 4 on 2/16 Plan -  Per primary  Cont stress steroids   Acute renal failure - r/t hypotension, contrast admin. Much improved with fluids. Plan -  Per primary  F/u chem    Hodgkin's lymphoma  ?CML myelodysplastic syndrome  Pancytopenia Plan -  Per heme/onc   Fever/ leucocytosis - multiple causes in my opinion -pneumonia, RUE DVT,adrenal h'age, but  high pct favor infectious source Hypotension - resolved with IVFs, nml lactate reassuring -antibiotics/ antifungals per ID   ALVA,RAKESH V.  [phone]

## 2013-11-16 NOTE — Progress Notes (Signed)
  She is looking a little better today. They tried to do a thoracentesis but there was not enough fluid.  She is not complaining of any shortness of breath. She has little cough. Again, cultures are all negative.  Her white cell count is stable. Hemoglobin 7.2. We may need to transfuse her at some point. Platelets 48,000. Her LDH is up to 737. Her renal function is holding steady.  She had she's out of bed a little bit. At that wouldnowneed to think about physical therapy for her.  She is eating better. There is no nausea vomiting.  She is on heparin. We may want to consider moving her over to Lovenox. Her right arm looks better. Not as swollen or red.  She continues on broad spectrum antibiotic coverage. There's still some ulcerations in her mouth. Viral cultures for herpes simplex were negative.  Her blood pressure is much better. I think the steroids are clearly helping with respect to her adrenal insufficiency.  Her temperatures are not as bad. He began blood pressure is much better.  On physical exam, I don't see anything that is changed. She still has good breath sounds. Heart exam is tachycardic but regular. Abdomen soft. Some slight tenderness on the left side. Extremities shows the swelling in the right arm. It does not appear to be a swollen. There is a slight edema in her legs.  I think her IV fluids can be cut back as her blood pressure is much better.  Hopefully, we are get her through this infectious issue.  Still not sure as to what's causing this.  We will continue to follow along closely. I thank everybody further outstanding help with her.  Laurey Arrow

## 2013-11-16 NOTE — Progress Notes (Signed)
Pt with plt <50 and K+ 3.0 pt d/t go to IR today for thoracentesis. [facility] paged awaiting call back.

## 2013-11-16 NOTE — Progress Notes (Signed)
[facility] PROGRESS NOTE  Lacey [name] OBS:[id number] DOB: [birth year] DOA: 10/30/2013 PCP: Vena Austria, MD  Assessment/Plan: 1-Health Care Associated PNA; Patient presents with cough, fever. Repeat CT chest 2-17 showed: consolidation in the medial aspect of the right upper lobe. Partial loculation of the right pleural effusion anteriorly is also noted. Appreciate Dr Baxter Flattery help.  Influenza negative.  Aspergillus antigen pending.  For thoracentesis today. Dr Jonette Eva will probably order platelet transfusion prior to thoracentesis.  Day 4 imipenem, day 8 Vancomycin. Plan to treat for 10 to 14 days.    2-Soft tissue stranding in the anterior mediastinum is of uncertain etiology and significance. If there been recent attempted central line placement, this could be iatrogenic. Less likely, this might be infectious or inflammatory, and clinical correlation for signs and symptoms of mediastinitis may be warranted Unclear etiology.   3-Acute Renal Failure; in setting of hypotension, contrast. Cr peak to 2.0. Improving with IV fluids. Cr has decrease to 1.5. replacing potassium.   4-Adrenal Insufficiency; Adrenal infarct/hemorrhage; Continue with Hydrocortisone. Will consider to transition to oral 2-20.   5-Hypotension ? Sepsis ?; Hypotension could be explain by adrenal insufficiency but in setting of PNA, leukocytosis, fever sepsis is higly in the differential. Continue with IV fluids, IV antibiotics.  Pro-calcitoni 175. Urine culture no growth, blood culture; no growth top date. Hypotension resolved.   6-Anemia; History of Hodgkin Lymphoma: Appreciate Dr Jonette Eva assistance. LDH increase to 737.. - Bone Marrow Biopsy: HYPERCELLULAR BONE MARROW FOR AGE WITH DYSPOIETIC CHANGES. Hb at 7.2.   7-Right upper extremity superficial thrombosis; On heparin Gtt due to concern for hypercoagulable state. Will follow hematologist recommendation for anticoagulation. Heparin will be on hold for  thoracentesis.   8-Skin eruptions  -Resolving. Etiology unclear however if infectious should be covered by broad-spectrum antibiotic  10-Mouth Ulcer; Continue with Acyclovir. Herpes virus culture pending.   Pyelonephritis; unlikely. Urine culture no growth to date.   Code Status: Full Code.  Family Communication: Disposition Plan: Remain step down. If remain stable after thoracentesis today will plan to transfer patient to regular floor on 2-20.    Consultants: Dr. Burney Gauze (oncology)  Dr Gibson Ramp (Surgery) curbside consult  Dr. Carlyle Basques (infectious disease)   Procedures: 11/12/2013 right upper extremity Doppler  No evidence of deep vein thrombosis involving the right upper extremity, left subclavian vein, and left internal jugular vein. There is superficial thrombosis noted in the right cephalic vein.   -4/76 Bone marrow biopsy report pending  Echocardiogram 01/10/2013  - Left ventricle: mild focal basal hypertrophy of the septum.  -LVEF= ejection fraction was 55%. Wall motion was normal; there were no regional wall motion abnormalities.  -(grade 1 diastolic dysfunction).  - Aortic valve: There was no stenosis. Trivial regurgitation. - Mitral valve: Trivial regurgitation. - Left atrium: The atrium was mildly dilated. - Right ventricle: The cavity size was normal. Systolic function was normal. - Tricuspid valve: Peak RV-RA gradient: 50mm Hg (S). - Pulmonary arteries: PA peak pressure: 85mm Hg (S). - Inferior vena cava: The vessel was normal in size; the respirophasic diameter changes were in the normal range (=50%); findings are consistent with normal central venous pressure.    Antibiotics: Aztreonam 2/11>> stopped 2/13  Ciprofloxacin 2/11>> stopped 2/15  Vancomycin 2/11>> stopped 2/15  Primaxin>> 2/16>>  Voriconazole 2/16>> stopped 2/18 Vancomycin 2/17>>   HPI/Subjective: No complaints. Has not had BM for last few days.   Objective: Filed Vitals:    11/16/13 0630  BP:   Pulse:  Temp: 99.1 F (37.3 C)  Resp: 14    Intake/Output Summary (Last 24 hours) at 11/16/13 0803 Last data filed at 11/16/13 0600  Gross per 24 hour  Intake 4552.83 ml  Output   1325 ml  Net 3227.83 ml   Filed Weights   11/14/13 0500 11/15/13 0500 11/16/13 0500  Weight: 96.6 kg (212 lb 15.4 oz) 101.6 kg (223 lb 15.8 oz) 103.4 kg (227 lb 15.3 oz)    Exam:   General:  No distress.   Cardiovascular: S 1, S 2 RRR  Respiratory: decrease breath sounds on the right, no wheezes, no crackles.   Abdomen: Bs present, soft, NT  Musculoskeletal: right arm with swelling , redness, pulse present.   Data Reviewed: Basic Metabolic Panel:  Recent Labs Lab 11/13/13 0445 11/13/13 2313 11/14/13 0435 11/15/13 0508 11/16/13 0524  NA 139 139 139 143 146  K 3.3* 3.2* 3.9 3.2* 3.0*  CL 100 104 107 111 112  CO2 $Re'26 24 22 'HMc$ 18* 18*  GLUCOSE 126* 87 95 163* 141*  BUN $Re'12 16 18 18 22  'Nwl$ CREATININE 1.06 2.10* 1.90* 1.61* 1.57*  CALCIUM 8.3* 7.8* 7.1* 7.8* 8.2*  MG 1.7 1.6 1.6 1.9 1.8  PHOS  --  4.0  --   --   --    Liver Function Tests:  Recent Labs Lab 11/12/13 0505 11/13/13 0445 11/14/13 0435 11/15/13 0508 11/16/13 0524  AST 15 16 34 40* 24  ALT $Re'19 17 20 26 22  'WBL$ ALKPHOS 66 70 64 70 68  BILITOT 0.5 0.6 0.3 0.3 0.6  PROT 6.8 6.9 5.6* 6.3 6.3  ALBUMIN 2.4* 2.3* 1.7* 1.8* 1.9*   No results found for this basename: LIPASE, AMYLASE,  in the last 168 hours No results found for this basename: AMMONIA,  in the last 168 hours CBC:  Recent Labs Lab 11/12/13 0505 11/13/13 0445 11/14/13 0435 11/14/13 0840 11/15/13 0508 11/16/13 0524  WBC 22.3* 21.5* 21.0*  --  19.2* 19.7*  NEUTROABS 11.8* 16.8* 17.4*  --  16.1* 15.7*  HGB 9.7* 10.1* 8.5*  --  7.9* 7.2*  HCT 29.4* 30.4* 27.0*  --  24.9* 22.2*  MCV 93.9 93.8 96.8  --  94.3 95.3  PLT 69* 63* PLATELET CLUMPS NOTED ON SMEAR, COUNT APPEARS DECREASED 46* PLATELET CLUMPS NOTED ON SMEAR, UNABLE TO ESTIMATE 48*    Cardiac Enzymes:  Recent Labs Lab 11/14/13 1040 11/14/13 1635 11/14/13 2140  TROPONINI <0.30 <0.30 <0.30   BNP (last 3 results)  Recent Labs  11/14/13 0435  PROBNP 2977.0*   CBG: No results found for this basename: GLUCAP,  in the last 168 hours  Recent Results (from the past 240 hour(s))  CULTURE, BLOOD (ROUTINE X 2)     Status: None   Collection Time    11/08/13 12:40 AM      Result Value Ref Range Status   Specimen Description BLOOD LEFT ANTECUBITAL   Final   Special Requests BOTTLES DRAWN AEROBIC AND ANAEROBIC 3CC   Final   Culture  Setup Time     Final   Value: 11/08/2013 03:32     Performed at [facility]   Culture     Final   Value: NO GROWTH 5 DAYS     Performed at [facility]   Report Status 11/14/2013 FINAL   Final  CULTURE, BLOOD (ROUTINE X 2)     Status: None   Collection Time    11/08/13 12:45 AM  Result Value Ref Range Status   Specimen Description BLOOD LEFT HAND   Final   Special Requests BOTTLES DRAWN AEROBIC AND ANAEROBIC 3CC   Final   Culture  Setup Time     Final   Value: 11/08/2013 03:31     Performed at [facility]   Culture     Final   Value: NO GROWTH 5 DAYS     Performed at [facility]   Report Status 11/14/2013 FINAL   Final  RESPIRATORY VIRUS PANEL     Status: None   Collection Time    11/11/13 12:00 AM      Result Value Ref Range Status   Source - RVPAN NASOPHARYNGEAL   Final   Respiratory Syncytial Virus A NOT DETECTED   Final   Respiratory Syncytial Virus B NOT DETECTED   Final   Influenza A NOT DETECTED   Final   Influenza B NOT DETECTED   Final   Parainfluenza 1 NOT DETECTED   Final   Parainfluenza 2 NOT DETECTED   Final   Parainfluenza 3 NOT DETECTED   Final   Metapneumovirus NOT DETECTED   Final   Rhinovirus NOT DETECTED   Final   Adenovirus NOT DETECTED   Final   Influenza A H1 NOT DETECTED   Final   Influenza A H3 NOT DETECTED   Final   Comment: (NOTE)           Normal  Reference Range for each Analyte: NOT DETECTED     Testing performed using the Luminex xTAG Respiratory Viral Panel test     kit.     This test was developed and its performance characteristics determined     by [facility]. It has not been cleared or approved by the [place]     Food and Drug Administration. This test is used for clinical purposes.     It should not be regarded as investigational or for research. This     laboratory is certified under the [place] (CLIA) as qualified to perform high complexity     clinical laboratory testing.     Performed at [place], BLOOD (ROUTINE X 2)     Status: None   Collection Time    11/13/13  6:20 AM      Result Value Ref Range Status   Specimen Description BLOOD LEFT HAND   Final   Special Requests     Final   Value: BOTTLES DRAWN AEROBIC AND ANAEROBIC [place] [facility] 3CC ANA   Culture  Setup Time     Final   Value: 11/13/2013 08:35     Performed at [facility]   Culture     Final   Value:        BLOOD CULTURE RECEIVED NO GROWTH TO DATE CULTURE WILL BE HELD FOR 5 DAYS BEFORE ISSUING A FINAL NEGATIVE REPORT     Performed at [facility]   Report Status PENDING   Incomplete  CULTURE, BLOOD (ROUTINE X 2)     Status: None   Collection Time    11/13/13  6:35 AM      Result Value Ref Range Status   Specimen Description BLOOD LEFT HAND   Final   Special Requests     Final   Value: BOTTLES DRAWN AEROBIC AND ANAEROBIC [place] [facility] 3CC ANA   Culture  Setup Time     Final  Value: 11/13/2013 08:35     Performed at [facility]   Culture     Final   Value:        BLOOD CULTURE RECEIVED NO GROWTH TO DATE CULTURE WILL BE HELD FOR 5 DAYS BEFORE ISSUING A FINAL NEGATIVE REPORT     Performed at [facility]   Report Status PENDING   Incomplete  MRSA PCR SCREENING     Status: None   Collection Time    11/13/13  6:04 PM      Result Value Ref Range Status    MRSA by PCR NEGATIVE  NEGATIVE Final   Comment:            The GeneXpert MRSA Assay (FDA     approved for NASAL specimens     only), is one component of a     comprehensive MRSA colonization     surveillance program. It is not     intended to diagnose MRSA     infection nor to guide or     monitor treatment for     MRSA infections.  URINE CULTURE     Status: None   Collection Time    11/13/13  8:55 PM      Result Value Ref Range Status   Specimen Description URINE, CATHETERIZED   Final   Special Requests NONE   Final   Culture  Setup Time     Final   Value: 11/14/2013 01:07     Performed at [facility] Count     Final   Value: NO GROWTH     Performed at [facility]   Culture     Final   Value: NO GROWTH     Performed at [facility]   Report Status 11/14/2013 FINAL   Final  CULTURE, EXPECTORATED SPUTUM-ASSESSMENT     Status: None   Collection Time    11/14/13  4:36 PM      Result Value Ref Range Status   Specimen Description SPUTUM   Final   Special Requests Immunocompromised   Final   Sputum evaluation     Final   Value: THIS SPECIMEN IS ACCEPTABLE. RESPIRATORY CULTURE REPORT TO FOLLOW.   Report Status 11/14/2013 FINAL   Final  CULTURE, RESPIRATORY (NON-EXPECTORATED)     Status: None   Collection Time    11/14/13  4:36 PM      Result Value Ref Range Status   Specimen Description SPUTUM   Final   Special Requests NONE   Final   Gram Stain     Final   Value: RARE WBC PRESENT, PREDOMINANTLY PMN     RARE SQUAMOUS EPITHELIAL CELLS PRESENT     NO ORGANISMS SEEN     Performed at [facility]   Culture PENDING   Incomplete   Report Status PENDING   Incomplete  HERPES SIMPLEX VIRUS CULTURE     Status: None   Collection Time    11/14/13  6:49 PM      Result Value Ref Range Status   Specimen Description MOUTH   Final   Special Requests Immunocompromised   Final   Culture     Final   Value: Culture has been initiated.      Performed at [facility]   Report Status PENDING   Incomplete     Studies: Ct Chest Wo Contrast  11/14/2013   CLINICAL DATA:  Fever.  Shortness of breath.  Abnormal chest x-ray.  EXAM: CT CHEST WITHOUT CONTRAST  TECHNIQUE: Multidetector CT imaging of the chest was performed following the standard protocol without IV contrast.  COMPARISON:  Chest CT 11/05/2013.  FINDINGS: Mediastinum: Left upper extremity PICC with tip terminating in the superior aspect of the right atrium. Heart size is mildly enlarged. There is no significant pericardial fluid, thickening or pericardial calcification. Soft tissue stranding in the anterior mediastinum is new compared to the prior study. Numerous reactive size mediastinal lymph nodes are noted the middle mediastinum. Esophagus is unremarkable in appearance.  Lungs/Pleura: Compared to the prior study there are new small a moderate bilateral pleural effusions (right greater than left). There is some new atelectasis and/or consolidation in the medial aspect of the right upper lobe which accounts for the perceived abnormality on the recent chest radiograph, as well as some adjacent anteriorly loculated right effusion superiorly. There also areas of mild passive atelectasis in the lower lobes of the lungs bilaterally. No suspicious appearing pulmonary nodules or masses are identified. Right perihilar architectural distortion similar to the prior study appears to be chronic.  Upper Abdomen: Low-attenuation hepatic lesions again noted, largest of which is in the central aspect of segments 7 and 8 measuring 3.1 x 2.6 cm. Left adrenal gland appears enlarged and demonstrates some internal high attenuation, which may suggest and adrenal infarct, similar to the recent prior study.  Musculoskeletal: Multiple prominent borderline enlarged right axillary lymph nodes, largest of which measures 9 mm. There are no aggressive appearing lytic or blastic lesions noted in the visualized  portions of the skeleton. Orthopedic fixation hardware in the lower cervical spine.  IMPRESSION: 1. Interval development of small to moderate bilateral pleural effusions (right greater than left) with some atelectasis and/or consolidation in the medial aspect of the right upper lobe which accounts for the perceived abnormality on the recent chest radiograph. Clinical correlation for signs and symptoms of right upper lobe pneumonia is recommended. This could represent infection with typical or atypical organisms, including fungal pneumonia in this immunocompromised host. Partial loculation of the right pleural effusion anteriorly is also noted. 2. Soft tissue stranding in the anterior mediastinum is of uncertain etiology and significance. If there been recent attempted central line placement, this could be iatrogenic. Less likely, this might be infectious or inflammatory, and clinical correlation for signs and symptoms of mediastinitis may be warranted. 3. Enlargement and high attenuation in the left adrenal gland, similar to the prior study, concerning for recent adrenal hemorrhage. 4. Additional incidental findings, as above.   Electronically Signed   By: Vinnie Langton M.D.   On: 11/14/2013 18:32    Scheduled Meds: . acyclovir  5 mg/kg (Ideal) Intravenous Q12H  . dextromethorphan-guaiFENesin  1 tablet Oral q morning - 10a  . DULoxetine  60 mg Oral QPM  . fluticasone  2 spray Each Nare Daily  . gabapentin  600 mg Oral BID  . hydrocortisone sodium succinate  100 mg Intravenous Q8H  . imipenem-cilastatin  500 mg Intravenous Q8H  . ipratropium-albuterol  3 mL Nebulization QID  . magic mouthwash w/lidocaine  10 mL Oral Q4H  . potassium chloride  10 mEq Intravenous Q1 Hr x 3  . sodium chloride  10-40 mL Intracatheter Q12H  . sodium chloride  3 mL Intravenous Q12H  . vancomycin  1,250 mg Intravenous Q24H   Continuous Infusions: . sodium chloride 100 mL/hr at 11/16/13 0508  . heparin 1,900 Units/hr  (11/15/13 2023)    Principal Problem:   PNA (  pneumonia) Active Problems:   Hodgkin lymphoma   Myelodysplasia   Adrenal abnormality   Rash and nonspecific skin eruption   DOE (dyspnea on exertion)   Symptomatic anemia   Melena   Adrenal infarction   Hypotension, unspecified   Sepsis   Acute renal failure    Time spent: 35 minutes.     Reva Pinkley  [facility] Pager [phone]. If 7PM-7AM, please contact night-coverage at [email or web address], password [facility] 11/16/2013, 8:03 AM  LOS: 9 days

## 2013-11-16 NOTE — Progress Notes (Signed)
[id number] Rosana Hoes, RN, BSN, CCM, [phone] Chart reviewed for update of needs and condition. patient will have thoracentesis [id number] due to increased plueral effusion/platlets infusion today/platlets at 48.0/wbc-19.7/temp 110.4/hr131/rr29.  Continues to meet sdu criteria.

## 2013-11-16 NOTE — Progress Notes (Signed)
ANTIBIOTIC CONSULT NOTE - Follow up  Pharmacy Consult for Vancomycin, anti-infective renal adjustment Indication: rule out sepsis  Allergies  Allergen Reactions  . Penicillins Hives and Rash    Patient Measurements: Height: 5' (152.4 cm) Weight: 227 lb 15.3 oz (103.4 kg) IBW/kg (Calculated) : 45.5  Vital Signs: Temp: 99.1 F (37.3 C) (02/19 0630) Temp src: Core (Comment) (02/19 0630) Pulse Rate: 109 (02/19 0530) Intake/Output from previous day: 02/18 0701 - 02/19 0700 In: 4685.3 [P.O.:1200; I.V.:2625.7; IV Piggyback:859.6] Out: 1425 [Urine:1425]  Labs:  Recent Labs  11/14/13 0435 11/14/13 0840 11/15/13 0508 11/16/13 0524  WBC 21.0*  --  19.2* 19.7*  HGB 8.5*  --  7.9* 7.2*  PLT PLATELET CLUMPS NOTED ON SMEAR, COUNT APPEARS DECREASED 46* PLATELET CLUMPS NOTED ON SMEAR, UNABLE TO ESTIMATE 48*  CREATININE 1.90*  --  1.61* 1.57*   Estimated Creatinine Clearance: 41.8 ml/min (by C-G formula based on Cr of 1.57).  Microbiology: 2/11 blood x2 >> NGF 2/11 influenza panel >> negative  2/14 respiratory virus panel >> negative  2/16 blood x 2 >> NGTD 2/16 urine >> NGF 2/17 Sputum:  2/17 HSV (mouth) : sent 2/17 Aspergillus galactomannan Ag: in process 2/18 fungitell (beta D glucan) test: in process    Anti-infectives:  2/10 >> cipro >> 2/13 2/11 >> aztreo >> 2/13 2/11 >> vanc >> 2/15 2/16 >> primaxin >>  2/16 >> voriconazole >> 2/18 2/17 >> vancomycin >> 2/17 >> acyclovir >>    Assessment: 59 yoF admitted 2/10 with N/V, abdominal pain, fever, and melena. PMH includes Hodgkin's lymphoma, s/p chemo, and myelodysplastic syndrome.  She was initially started on broad spectrum antibiotics, which were d/c on 2/13 and 2/15.  Primaxin and voriconazole were started on 2/16 for fevers, immunocompromised status, r/o infection (sinus).  Overnight she was transferred to the ICU for persistent hypotension and r/o sepsis.  Acyclovir is added for potentially viral mouth ulcers and  Pharmacy is consulted to dose vancomycin.  Tmax: 103.8  WBCs: 21K   Renal: Scr 1.9, CrCl ~ 33 ml/min CG  Cultures remain negative.  Goal of Therapy:  Vancomycin trough level 15-20 mcg/ml Appropriate abx dosing, eradication of infection.   Plan:   Acyclovir 230mg  (5 mg/kg * ideal weight 45.5 kg) IV q12h   Primaxin 500mg  IV q8h  Vancomycin 2g IV x1 then 1250mg  IV q24h.  Measure Vanc trough at steady state.  Follow up renal fxn and culture results.  Gretta Arab PharmD, BCPS Pager [phone] 11/16/2013 7:14 AM

## 2013-11-16 NOTE — Progress Notes (Signed)
Patient ID: Lacey [name], female   DOB: 02-25-1956, 58 y.o.   MRN: [id number] Pt presented to [place] dept today for right thoracentesis. Limited US of both right and left pleural spaces reveals small effusions and atelectatic/consolidated lung obscuring field precluding safe access to perform thoracentesis without increased risk of pneumothorax. Case d/w Dr. Kathlene Cote and decision made to postpone procedure for now. Can reassess for enlarging effusions/poss thora in few days with portable US if desired. Above d/w Dr. Tyrell Antonio and pt.

## 2013-11-16 NOTE — Progress Notes (Signed)
[id number] Rosana Hoes, RN, BSN, CCM, [phone] Chart reviewed for update of needs and condition. patient will have thoracentesis [id number] due to increased plueral effusion/platlets infusion today/platlets at 48.0/wbc-19.7/temp 110.4/hr131/rr29.  Continues to meet sdu criteria. Case reviewed in LOS meeting.

## 2013-11-16 NOTE — Progress Notes (Signed)
ANTICOAGULATION CONSULT NOTE - Follow Up Consult  Pharmacy Consult for Heparin Indication: VTE treatment  Allergies  Allergen Reactions  . Penicillins Hives and Rash    Patient Measurements: Height: 5' (152.4 cm) Weight: 227 lb 15.3 oz (103.4 kg) IBW/kg (Calculated) : 45.5 Heparin Dosing Weight: 69 kg  Vital Signs: Temp: 99.1 F (37.3 C) (02/19 0630) Temp src: Core (Comment) (02/19 0630) Pulse Rate: 109 (02/19 0530)  Labs:  Recent Labs  11/14/13 0435 11/14/13 0840 11/14/13 1040 11/14/13 1635  11/14/13 2140 11/15/13 0508 11/15/13 1225 11/15/13 1915 11/16/13 0524  HGB 8.5*  --   --   --   --   --  7.9*  --   --  7.2*  HCT 27.0*  --   --   --   --   --  24.9*  --   --  22.2*  PLT PLATELET CLUMPS NOTED ON SMEAR, COUNT APPEARS DECREASED 46*  --   --   --   --  PLATELET CLUMPS NOTED ON SMEAR, UNABLE TO ESTIMATE  --   --  48*  HEPARINUNFRC  --   --   --   --   < >  --  0.13* 0.30 0.20* 0.56  CREATININE 1.90*  --   --   --   --   --  1.61*  --   --  1.57*  TROPONINI  --   --  <0.30 <0.30  --  <0.30  --   --   --   --   < > = values in this interval not displayed.  Estimated Creatinine Clearance: 41.8 ml/min (by C-G formula based on Cr of 1.57).   Medications:  Infusions:  . sodium chloride 100 mL/hr at 11/16/13 0508  . heparin 1,900 Units/hr (11/15/13 2023)    Assessment: 2 yoF admitted 2/10 with N/V, abdominal pain, fever, and melena. PMH includes Hodgkin's lymphoma, s/p chemo, and myelodysplastic syndrome.  Doppler completed 2/15 and shows no DVT, but noted superficial thrombus in the right cephalic vein from [facility] to mid upper arm. Pharmacy consulted to dose IV Heparin for thrombus and MD suspects hypercoagulable status.   Hemoccult negative x1 on 11/03/2013   CBC: Hgb decreasing (8.5 --> 7.9 --> 7.2), Plt clumped, but Plt = 48 on 2/19.   MD is aware of thrombocytopenia.   Pt received Aransep injection on 11/09/13 and 2 units PRBC on 2/13.  No active bleeding  reported.  Rn reports no complications.  Noted possible plan for thoracentesis, RN states heparin to be turned off at 8am.  Heparin level 0.56, within the goal range.   Goal of Therapy:  Heparin level 0.3-0.7 units/ml Monitor platelets by anticoagulation protocol: Yes   Plan:   Continue heparin IV infusion at 1900 units/hr  Heparin level in 6 hours to confirm therapeutic level  Daily heparin level and CBC  Continue to monitor H&H and platelets closely with known anemia and thrombocytopenia.  Follow up plan for thoracentesis today and heparin timing.   Gretta Arab PharmD, BCPS Pager [phone] 11/16/2013 6:52 AM

## 2013-11-17 DIAGNOSIS — A419 Sepsis, unspecified organism: Secondary | ICD-10-CM

## 2013-11-17 LAB — CULTURE, RESPIRATORY

## 2013-11-17 LAB — COMPREHENSIVE METABOLIC PANEL
ALBUMIN: 2.1 g/dL — AB (ref 3.5–5.2)
ALK PHOS: 68 U/L (ref 39–117)
ALT: 21 U/L (ref 0–35)
AST: 22 U/L (ref 0–37)
BUN: 28 mg/dL — ABNORMAL HIGH (ref 6–23)
CALCIUM: 8.4 mg/dL (ref 8.4–10.5)
CO2: 19 mEq/L (ref 19–32)
Chloride: 111 mEq/L (ref 96–112)
Creatinine, Ser: 1.49 mg/dL — ABNORMAL HIGH (ref 0.50–1.10)
GFR calc non Af Amer: 37 mL/min — ABNORMAL LOW (ref >90)
GFR, EST AFRICAN AMERICAN: 43 mL/min — AB (ref >90)
GLUCOSE: 152 mg/dL — AB (ref 70–99)
POTASSIUM: 3.4 meq/L — AB (ref 3.7–5.3)
SODIUM: 143 meq/L (ref 137–147)
TOTAL PROTEIN: 6.4 g/dL (ref 6.0–8.3)
Total Bilirubin: 1 mg/dL (ref 0.3–1.2)

## 2013-11-17 LAB — CBC WITH DIFFERENTIAL/PLATELET
BASOS ABS: 0 10*3/uL (ref 0.0–0.1)
BASOS PCT: 0 % (ref 0–1)
Band Neutrophils: 0 % (ref 0–10)
Blasts: 0 %
Eosinophils Absolute: 0 10*3/uL (ref 0.0–0.7)
Eosinophils Relative: 0 % (ref 0–5)
HCT: 21.2 % — ABNORMAL LOW (ref 36.0–46.0)
Hemoglobin: 6.8 g/dL — CL (ref 12.0–15.0)
Lymphocytes Relative: 21 % (ref 12–46)
Lymphs Abs: 5.4 10*3/uL — ABNORMAL HIGH (ref 0.7–4.0)
MCH: 30.9 pg (ref 26.0–34.0)
MCHC: 32.1 g/dL (ref 30.0–36.0)
MCV: 96.4 fL (ref 78.0–100.0)
METAMYELOCYTES PCT: 3 %
MYELOCYTES: 5 %
Monocytes Absolute: 5.7 10*3/uL — ABNORMAL HIGH (ref 0.1–1.0)
Monocytes Relative: 22 % — ABNORMAL HIGH (ref 3–12)
NEUTROS PCT: 46 % (ref 43–77)
Neutro Abs: 14.8 10*3/uL — ABNORMAL HIGH (ref 1.7–7.7)
Platelets: 93 10*3/uL — ABNORMAL LOW (ref 150–400)
Promyelocytes Absolute: 3 %
RBC: 2.2 MIL/uL — ABNORMAL LOW (ref 3.87–5.11)
RDW: 20.6 % — ABNORMAL HIGH (ref 11.5–15.5)
WBC: 25.9 10*3/uL — ABNORMAL HIGH (ref 4.0–10.5)
nRBC: 1 /100 WBC — ABNORMAL HIGH

## 2013-11-17 LAB — LACTATE DEHYDROGENASE: LDH: 1119 U/L — ABNORMAL HIGH (ref 94–250)

## 2013-11-17 LAB — PREPARE RBC (CROSSMATCH)

## 2013-11-17 LAB — CULTURE, RESPIRATORY W GRAM STAIN: Culture: NORMAL

## 2013-11-17 LAB — ASPERGILLUS GALACTOMANNAN ANTIGEN
ASPERGILLUS GALACTOMANNAN INDEX: 0.27 (ref <0.50)
Aspergillus galactomannan Ag: NOT DETECTED

## 2013-11-17 LAB — RETICULOCYTES
RBC.: 2.2 MIL/uL — ABNORMAL LOW (ref 3.87–5.11)
RETIC COUNT ABSOLUTE: 30.8 10*3/uL (ref 19.0–186.0)
Retic Ct Pct: 1.4 % (ref 0.4–3.1)

## 2013-11-17 LAB — PRO B NATRIURETIC PEPTIDE: PRO B NATRI PEPTIDE: 9541 pg/mL — AB (ref 0–125)

## 2013-11-17 LAB — PREPARE PLATELET PHERESIS: Unit division: 0

## 2013-11-17 LAB — HEMOGLOBIN AND HEMATOCRIT, BLOOD
HCT: 28.2 % — ABNORMAL LOW (ref 36.0–46.0)
HEMOGLOBIN: 9.4 g/dL — AB (ref 12.0–15.0)

## 2013-11-17 LAB — MAGNESIUM: Magnesium: 1.9 mg/dL (ref 1.5–2.5)

## 2013-11-17 LAB — VANCOMYCIN, TROUGH: VANCOMYCIN TR: 17 ug/mL (ref 10.0–20.0)

## 2013-11-17 LAB — HEPARIN LEVEL (UNFRACTIONATED): HEPARIN UNFRACTIONATED: 0.57 [IU]/mL (ref 0.30–0.70)

## 2013-11-17 MED ORDER — HYDROCORTISONE 20 MG PO TABS
30.0000 mg | ORAL_TABLET | Freq: Two times a day (BID) | ORAL | Status: DC
  Administered 2013-11-17 – 2013-11-20 (×8): 30 mg via ORAL
  Filled 2013-11-17 (×10): qty 1

## 2013-11-17 MED ORDER — SENNOSIDES-DOCUSATE SODIUM 8.6-50 MG PO TABS
2.0000 | ORAL_TABLET | Freq: Three times a day (TID) | ORAL | Status: DC
  Administered 2013-11-17 – 2013-11-18 (×5): 2 via ORAL
  Filled 2013-11-17 (×7): qty 2

## 2013-11-17 MED ORDER — ENOXAPARIN SODIUM 100 MG/ML ~~LOC~~ SOLN
100.0000 mg | Freq: Two times a day (BID) | SUBCUTANEOUS | Status: DC
  Administered 2013-11-17 – 2013-11-20 (×8): 100 mg via SUBCUTANEOUS
  Filled 2013-11-17 (×11): qty 1

## 2013-11-17 MED ORDER — FUROSEMIDE 10 MG/ML IJ SOLN: 60.0000 mg | Freq: Once | INTRAMUSCULAR | Status: DC

## 2013-11-17 MED ORDER — FUROSEMIDE 10 MG/ML IJ SOLN
60.0000 mg | Freq: Once | INTRAMUSCULAR | Status: AC
  Administered 2013-11-17: 60 mg via INTRAVENOUS
  Filled 2013-11-17: qty 6

## 2013-11-17 NOTE — Evaluation (Signed)
Physical Therapy Evaluation Patient Details Name: Lacey [name] MRN: [id number] DOB: 07-04-1956 Today's Date: 11/17/2013 Time: 1884-1660 PT Time Calculation (min): 15 min  PT Assessment / Plan / Recommendation History of Present Illness  58 y.o. WF PMHx Hodgkin's lymphoma status post chemotherapy in the pas has myelodysplastic syndrome. The patient is coming from home. Patient presents with complaints of nausea vomiting and right-sided abdominal pain. The patient mentions that her symptoms started a month ago with cough and yellowish expectoration for which she completed a course of amoxicillin despite which her cough was not getting better.since last one month she started having dyspnea on exertion which is progressively worsening.denies any orthopnea or PND.    Clinical Impression  *Pt admitted with PNA*. Pt currently with functional limitations due to the deficits listed below (see PT Problem List).  Pt will benefit from skilled PT to increase their independence and safety with mobility to allow discharge to the venue listed below.   **    PT Assessment  Patient needs continued PT services    Follow Up Recommendations  SNF;Supervision for mobility/OOB    Does the patient have the potential to tolerate intense rehabilitation      Barriers to Discharge        Equipment Recommendations  None recommended by PT    Recommendations for Other Services     Frequency Min 3X/week    Precautions / Restrictions Precautions Precautions: None Precaution Comments: monitor HR with walking Restrictions Weight Bearing Restrictions: No   Pertinent Vitals/Pain *HR 121 at rest, 134 with walking SaO2 96% at rest on RA, 92% on RA walking 3/4 dyspnea with walking 0/10 pain**      Mobility  Transfers Overall transfer level: Needs assistance Equipment used: Rolling walker (2 wheeled) Transfers: Sit to/from Stand Sit to Stand: Min guard General transfer comment: VCs for hand placement,  min/guard for safety Ambulation/Gait Ambulation/Gait assistance: Min guard Ambulation Distance (Feet): 35 Feet Assistive device: Rolling walker (2 wheeled) Gait Pattern/deviations: WFL(Within Functional Limits) General Gait Details: HR 121 at rest, 134 with walking, SaO2 92% on RA walking, 3/4 dyspnea with walking, VCs for pursed lip breathing, min/guard for safety    Exercises Total Joint Exercises Ankle Circles/Pumps: AROM;Both;10 reps;Supine   PT Diagnosis: Generalized weakness  PT Problem List: Decreased activity tolerance;Cardiopulmonary status limiting activity;Decreased mobility PT Treatment Interventions: Gait training;Stair training;Functional mobility training;Therapeutic exercise;Patient/family education;Therapeutic activities     PT Goals(Current goals can be found in the care plan section) Acute Rehab PT Goals Patient Stated Goal: to get stronger PT Goal Formulation: With patient Time For Goal Achievement: 12/01/13 Potential to Achieve Goals: Good  Visit Information  Last PT Received On: 11/17/13 Assistance Needed: +1 History of Present Illness: 58 y.o. WF PMHx Hodgkin's lymphoma status post chemotherapy in the pas has myelodysplastic syndrome. The patient is coming from home. Patient presents with complaints of nausea vomiting and right-sided abdominal pain. The patient mentions that her symptoms started a month ago with cough and yellowish expectoration for which she completed a course of amoxicillin despite which her cough was not getting better.since last one month she started having dyspnea on exertion which is progressively worsening.denies any orthopnea or PND.         Prior [place] expects to be discharged to:: Private residence Living Arrangements: Spouse/significant other;Parent Type of Home: House Home Access: Stairs to enter [employer] of Steps: 1 Entrance Stairs-Rails: None Home Layout: One level Home  Equipment: Environmental consultant - 2 wheels Prior  Function Level of Independence: Independent Communication Communication: No difficulties    Cognition  Cognition Arousal/Alertness: Awake/alert Behavior During Therapy: WFL for tasks assessed/performed Overall Cognitive Status: Within Functional Limits for tasks assessed    Extremity/Trunk Assessment Upper Extremity Assessment Upper Extremity Assessment: Overall WFL for tasks assessed Lower Extremity Assessment Lower Extremity Assessment: Overall WFL for tasks assessed Cervical / Trunk Assessment Cervical / Trunk Assessment: Normal   Balance Balance Overall balance assessment: Needs assistance Sitting balance-Leahy Scale: Good Standing balance-Leahy Scale: Fair  End of Session PT - End of Session Equipment Utilized During Treatment: Gait belt Activity Tolerance: Patient limited by fatigue Patient left: with call bell/phone within reach;in chair;with nursing/sitter in room Nurse Communication: Mobility status  GP     Blondell Reveal Kistler 11/17/2013, 10:19 AM [phone]

## 2013-11-17 NOTE — Progress Notes (Signed)
Lacey [name] continues to improve, slowly but surely. She cannot have the thoracentesis. Apparently there was not enough fluid noted. Maybe some of that fluid is final overload. She is up about 35 L. Her blood pressure is very good now. I think she may need to have some diuresis. I will check a pro-BNP on her.  Her hemoglobin was 6.8. We will transfuse her 2 units. Again, she'll need to be diuresis aggressively.  Her temperatures have been doing better. So far, all cultures have been negative. Is still not clear whether she has pneumonia.  Her LDH is going up quickly. It's now over 1100. She is not hemolyzing. I think this is a reflection of her bone marrow activity. We're going to have to initiate some form of therapy with her bone marrow, I think next week.  There is no bleeding. She is on heparin. Her right arm looks looks better. I think we are probably switch her over to Lovenox. I do not want to use Xarelto given the other issues that are going on with her right now and with her low platelets.  I we need to make sure that she gets physical therapy. We did make sure that she uses her incentive spirometer.  Her vital signs are okay. Her blood pressure is certainly fine. Her oral exam shows improved ulcerations in the mouth. Lungs showed scattered crackles in the bases. Cardiac exam is tachycardic but regular. Abdomen is soft. She saw a distended. Bowel sounds are present. Might be some slight tenderness over the left side. There is no palpable liver or spleen. Extremities do show edema in all extremities. There might be increased edema in the right arm. The right arm is less erythematous. Neurological exam no focal neurological deficits.  Her renal function looks better. Potassium 3.4.  We are making improvement. Hopefully she can be moved out of the ICU in a day or so.  I think we can start him back on the hydrocortisone. I think diuresis will help [place] on this edema.  We'll transfuse her 2  units of blood.  Will convert her over to Lovenox.  We just need to increase her mobility now.  I very much appreciate all the great care that she is getting.  [name]  Proverbs 16:3

## 2013-11-17 NOTE — Progress Notes (Signed)
ANTIBIOTIC CONSULT NOTE - FOLLOW UP  Pharmacy Consult for Vancomycin, anti-infective renal adjustment Indication: rule out sepsis  Allergies  Allergen Reactions  . Penicillins Hives and Rash    Patient Measurements: Height: 5' (152.4 cm) Weight: 226 lb 6.6 oz (102.7 kg) IBW/kg (Calculated) : 45.5  Vital Signs: Temp: 99.5 F (37.5 C) (02/20 0800) Temp src: Core (Comment) (02/20 0400) BP: 141/88 mmHg (02/20 0800) Pulse Rate: 110 (02/20 0800) Intake/Output from previous day: 02/19 0701 - 02/20 0700 In: 3473 [P.O.:2420; I.V.:477; Blood:271; IV PPIRJJOAC:166] Out: 0630 [ZSWFU:9323]  Labs:  Recent Labs  11/15/13 0508 11/16/13 0524 11/17/13 0405  WBC 19.2* 19.7* 25.9*  HGB 7.9* 7.2* 6.8*  PLT PLATELET CLUMPS NOTED ON SMEAR, UNABLE TO ESTIMATE 48* 93*  CREATININE 1.61* 1.57* 1.49*   Estimated Creatinine Clearance: 43.9 ml/min (by C-G formula based on Cr of 1.49).  Recent Labs  11/17/13 0800  VANCOTROUGH 17.0     Microbiology: 2/11 blood x2 >> NGF 2/11 influenza panel >> negative  2/14 respiratory virus panel >> negative  2/16 blood x 2 >> NGTD 2/16 urine >> NGF 2/17 Sputum: normal flora 2/17 HSV (mouth) : negative 2/17 Aspergillus galactomannan Ag: Not Detected  2/18 fungitell (beta D glucan) test: in process   Anti-infectives: 2/10 >> cipro >> 2/13 2/11 >> aztreo >> 2/13 2/11 >> vanc >> 2/15 2/16 >> primaxin (MD) >>  2/16 >> voriconazole >> 2/18 2/17 >> vancomycin >> 2/17 >> acyclovir >> 2/20   Assessment: 29 yoF admitted 2/10 with N/V, abdominal pain, fever, and melena. PMH includes Hodgkin's lymphoma, s/p chemo, and myelodysplastic syndrome. She was initially started on broad spectrum antibiotics, which were d/c on 2/13 and 2/15. Primaxin and voriconazole were started on 2/16 for fevers, immunocompromised status, r/o infection (sinus). Overnight she was transferred to the ICU for persistent hypotension and r/o sepsis. Acyclovir is added for potentially  viral mouth ulcers and Pharmacy is consulted to dose vancomycin.  Day # 5 Primaxin and day # 4 vancomycin  Tmax: 100.2, Tc 99.5  WBCs: 25.9 (myelodysplastic and myeloproliferative d/o, steroids)  Renal: Scr improving, 1.49, CrCl ~ 44 ml/min CG, ~ 46 N  Cultures remain negative to date.  Goal of Therapy:  Vancomycin trough level 15-20 mcg/ml Appropriate abx dosing, eradication of infection.  Plan:   Continue Primaxin 500mg  IV q8h  Continue Vancomycin 1250mg  IV q24h.  Follow up renal fxn and culture results.   Gretta Arab PharmD, BCPS Pager [phone] 11/17/2013 10:12 AM

## 2013-11-17 NOTE — Progress Notes (Addendum)
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  Treated with abx and supportive care.  CT chest r/o PE, abd CT showed L adrenal infarct.  BM bx thought c/w CMML.  Infectious w/u remains negative.  On 2/17 developed worsening cough and repeat CT chest showed loculated R>L effusions and PCCM consulted.   SIGNIFICANT EVENTS / STUDIES:  2/16 CT abd/pelvis>>> Bilateral adrenal gland abnormality, new on the left since 11/17/2013. 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  New small bilateral pleural effusions. 2/17 CT chest>>> Partially loculated moderate R>L pleural effusions, RUL consolidation, L adrenal enlargement   LINES / TUBES: L Rad aline>>>d/c 2/20  CULTURES: Sputum 2/17>>>oral flora Urine 2/17>>>neg  BCx2 2/11>>>neg  RVP>>> NEG  ANTIBIOTICS: Acyclovir 2/17>>>2/20 Imipenem 2/17>>> Vanc 2/17>>>  SUBJ - Reports she feels better than yesterday, remains swollen.  No acute distress.  Bleeding after a-line removal Low grade temp  VITAL SIGNS: Temp:  [99 F (37.2 C)-100.2 F (37.9 C)] 99 F (37.2 C) (02/20 0400) Pulse Rate:  [111-119] 111 (02/20 0400) Resp:  [15-17] 16 (02/20 0400) BP: (113-154)/(64-101) 145/74 mmHg (02/20 0400) SpO2:  [91 %-94 %] 92 % (02/20 0400) Weight:  [226 lb 6.6 oz (102.7 kg)] 226 lb 6.6 oz (102.7 kg) (02/20 0400)  PHYSICAL EXAMINATION: General:  Chronically ill appearing female, NAD  Neuro:  Awake, alert, MAE HEENT:  Mm moist, no JVD Cardiovascular:  s1s2 rrr Lungs:  resps even non labored on [place], diminished bases R>L, few scattered rhonchi   Abdomen:  Soft, +bs Musculoskeletal:  Warm and dry, scant BLE edema   Recent Labs Lab 11/15/13 0508 11/16/13 0524 11/17/13 0405  NA 143 146 143  K 3.2* 3.0* 3.4*  CL 111 112 111  CO2 18* 18* 19  BUN 18 22 28*  CREATININE 1.61* 1.57* 1.49*  GLUCOSE 163* 141* 152*    Recent Labs Lab 11/15/13 0508 11/16/13 0524 11/17/13 0405  HGB 7.9* 7.2* 6.8*  HCT 24.9* 22.2* 21.2*  WBC 19.2* 19.7* 25.9*  PLT PLATELET CLUMPS NOTED ON SMEAR, UNABLE TO ESTIMATE 48* 93*   [place] Chest  11/16/2013   CLINICAL DATA:  Bilateral pleural effusions.  EXAM: CHEST ULTRASOUND  COMPARISON:  CT scan dated 11/14/2013  FINDINGS: There is only a small subpulmonic right pleural effusion. Fluid volume was not felt to be adequate to safely perform thoracentesis.  IMPRESSION: Small right subpulmonic effusion.  Thoracentesis not performed.   Electronically Signed   By: Rozetta Nunnery M.D.   On: 11/16/2013 12:56    ASSESSMENT / PLAN:  Pleural effusion - partially loculated , favor fluid overload rather than parapneumonic, Not enough fluid per IR to drain on [place] chest Suspected HCAP - RUL consolidation  Plan -  If fluid increases, consider repeat look per IR for diagnostic sampling O2 as needed    RUE DVT  - cephalic, related to PICC Hold heparin x 1hr after a line removal, can transition to lovenox , advise 56mnths of anticoagulation ideally if able, if not few weeks may suffice Limb elevation, warm compress  Adrenal insufficiency/ adrenal infarct/hemorrhage  Pm cortisol  4 on 2/16 Plan -  Per primary  Cont stress steroids with slow taper   Acute renal failure - r/t hypotension, contrast admin. Much improved with fluids. Plan -  Per primary  Lasix 40 Iv daily F/u chem    Hodgkin's lymphoma  ?CML myelodysplastic syndrome  Pancytopenia Plan -  Per heme/onc , transfusion planned  Fever/ leucocytosis - multiple causes in my opinion -pneumonia, RUE DVT,adrenal h'age, but high pct favor infectious  source Hypotension - resolved with IVFs, nml lactate reassuring Plan- -antibiotics/ antifungals per ID   PCCM will sign off. Please call if we can be of further assistance.   Can transfer to floor  Noe Gens, NP-C [place] Pgr: [phone] or [phone]   Independently examined pt, evaluated data & formulated above care plan with NP who scribed this note & edited by me.  Jolene Guyett [phone]. [phone]

## 2013-11-17 NOTE — Progress Notes (Signed)
[facility] PROGRESS NOTE  Lacey [name] HTD:[id number] DOB: 1956-07-09 DOA: 11/08/2013 PCP: Vena Austria, MD  Assessment/Plan: 1-Health Care Associated PNA; Patient presents with cough, fever. Repeat CT chest 2-17 showed: consolidation in the medial aspect of the right upper lobe. Partial loculation of the right pleural effusion anteriorly is also noted. Appreciate Dr Baxter Flattery help.  Influenza negative.  Aspergillus antigen no detected.  No enough fluid to allow thoracentesis.  Day 5 imipenem, day 9 Vancomycin. Plan to treat for 10 to 14 days.  WBC increased. Afebrile. Repeat CBC in am.   2-Soft tissue stranding in the anterior mediastinum is of uncertain etiology and significance. If there been recent attempted central line placement, this could be iatrogenic. Less likely, this might be infectious or inflammatory, and clinical correlation for signs and symptoms of mediastinitis may be warranted Unclear etiology.   3-Acute Renal Failure; in setting of hypotension, contrast. Cr peak to 2.0. Improving with IV fluids. Cr has decrease to 1..4. Monitor. She will received IV lasix after blood transfusion. Will repeat B-met. Will consider repeat lasix dose tomorrow depending on renal function.   4-Adrenal Insufficiency; Adrenal infarct/hemorrhage; Continue with Hydrocortisone. Change to oral 2-20.   5-Hypotension ? Sepsis ?; Hypotension could be explain by adrenal insufficiency but in setting of PNA, leukocytosis, fever sepsis is higly in the differential.  IV antibiotics.  Pro-calcitoni 175. Urine culture no growth, blood culture; no growth top date. Hypotension resolved.   6-Anemia; History of Hodgkin Lymphoma: Appreciate Dr Jonette Eva assistance. LDH increase to 737.. - Bone Marrow Biopsy: HYPERCELLULAR BONE MARROW FOR AGE WITH DYSPOIETIC CHANGES. Hb at 6. 2 units of PRBC ordered.   7-Right upper extremity superficial thrombosis; On heparin Gtt due to concern for hypercoagulable  state. Will follow hematologist recommendation for anticoagulation. Heparin will be on hold for thoracentesis.   8-Skin eruptions  -Resolving. Etiology unclear however if infectious should be covered by broad-spectrum antibiotic  10-Mouth Ulcer;  Herpes virus culture no herpes detected. Received acyclovir for 3 days.   Pyelonephritis; unlikely. Urine culture no growth to date.   Code Status: Full Code.  Family Communication: care discussed with patient.  Disposition Plan: transfer to regular bed, later today.    Consultants: Dr. Burney Gauze (oncology)  Dr Gibson Ramp (Surgery) curbside consult  Dr. Carlyle Basques (infectious disease)   Procedures: 11/12/2013 right upper extremity Doppler  No evidence of deep vein thrombosis involving the right upper extremity, left subclavian vein, and left internal jugular vein. There is superficial thrombosis noted in the right cephalic vein.   -7/26 Bone marrow biopsy report pending  Echocardiogram 01/10/2013  - Left ventricle: mild focal basal hypertrophy of the septum.  -LVEF= ejection fraction was 55%. Wall motion was normal; there were no regional wall motion abnormalities.  -(grade 1 diastolic dysfunction).  - Aortic valve: There was no stenosis. Trivial regurgitation. - Mitral valve: Trivial regurgitation. - Left atrium: The atrium was mildly dilated. - Right ventricle: The cavity size was normal. Systolic function was normal. - Tricuspid valve: Peak RV-RA gradient: 62m Hg (S). - Pulmonary arteries: PA peak pressure: 232mHg (S). - Inferior vena cava: The vessel was normal in size; the respirophasic diameter changes were in the normal range (=50%); findings are consistent with normal central venous pressure.    Antibiotics: Aztreonam 2/11>> stopped 2/13  Ciprofloxacin 2/11>> stopped 2/15  Vancomycin 2/11>> stopped 2/15  Primaxin>> 2/16>>  Voriconazole 2/16>> stopped 2/18 Vancomycin 2/17>>   HPI/Subjective: SOB on  exertion. Had panic attack last night.  Feeling better this morning.   Objective: Filed Vitals:   11/17/13 0400  BP: 145/74  Pulse: 111  Temp: 99 F (37.2 C)  Resp: 16    Intake/Output Summary (Last 24 hours) at 11/17/13 0753 Last data filed at 11/17/13 0600  Gross per 24 hour  Intake   3473 ml  Output   1395 ml  Net   2078 ml   Filed Weights   11/15/13 0500 11/16/13 0500 11/17/13 0400  Weight: 101.6 kg (223 lb 15.8 oz) 103.4 kg (227 lb 15.3 oz) 102.7 kg (226 lb 6.6 oz)    Exam:   General:  No distress.   Cardiovascular: S 1, S 2 RRR  Respiratory: decrease breath sounds on the right, no wheezes, no crackles.   Abdomen: Bs present, soft, NT  Musculoskeletal: right arm with swelling , redness, pulse present.   Data Reviewed: Basic Metabolic Panel:  Recent Labs Lab 11/13/13 0445 11/13/13 2313 11/14/13 0435 11/15/13 0508 11/16/13 0524 11/17/13 0405  NA 139 139 139 143 146 143  K 3.3* 3.2* 3.9 3.2* 3.0* 3.4*  CL 100 104 107 111 112 111  CO2 _0 18* 18* 19  GLUCOSE 126* 87 95 163* 141* 152*  BUN _1 28*  CREATININE 1.06 2.10* 1.90* 1.61* 1.57* 1.49*  CALCIUM 8.3* 7.8* 7.1* 7.8* 8.2* 8.4  MG 1.7 1.6 1.6 1.9 1.8 1.9  PHOS  --  4.0  --   --   --   --    Liver Function Tests:  Recent Labs Lab 11/13/13 0445 11/14/13 0435 11/15/13 0508 11/16/13 0524 11/17/13 0405  AST 16 34 40* 24 22  ALT _2 ALKPHOS 70 64 70 68 68  BILITOT 0.6 0.3 0.3 0.6 1.0  PROT 6.9 5.6* 6.3 6.3 6.4  ALBUMIN 2.3* 1.7* 1.8* 1.9* 2.1*   No results found for this basename: LIPASE, AMYLASE,  in the last 168 hours No results found for this basename: AMMONIA,  in the last 168 hours CBC:  Recent Labs Lab 11/13/13 0445 11/14/13 0435 11/14/13 0840 11/15/13 0508 11/16/13 0524 11/17/13 0405  WBC 21.5* 21.0*  --  19.2* 19.7* 25.9*  NEUTROABS 16.8* 17.4*  --  16.1* 15.7* 14.8*  HGB 10.1* 8.5*  --  7.9* 7.2* 6.8*  HCT 30.4* 27.0*  --  24.9* 22.2* 21.2*   MCV 93.8 96.8  --  94.3 95.3 96.4  PLT 63* PLATELET CLUMPS NOTED ON SMEAR, COUNT APPEARS DECREASED 46* PLATELET CLUMPS NOTED ON SMEAR, UNABLE TO ESTIMATE 48* 93*   Cardiac Enzymes:  Recent Labs Lab 11/14/13 1040 11/14/13 1635 11/14/13 2140  TROPONINI <0.30 <0.30 <0.30   BNP (last 3 results)  Recent Labs  11/14/13 0435  PROBNP 2977.0*   CBG: No results found for this basename: GLUCAP,  in the last 168 hours  Recent Results (from the past 240 hour(s))  CULTURE, BLOOD (ROUTINE X 2)     Status: None   Collection Time    11/08/13 12:40 AM      Result Value Ref Range Status   Specimen Description BLOOD LEFT ANTECUBITAL   Final   Special Requests BOTTLES DRAWN AEROBIC AND ANAEROBIC 3CC   Final   Culture  Setup Time     Final   Value: 11/08/2013 03:32     Performed at [facility]   Culture     Final   Value: NO GROWTH 5 DAYS     Performed at [facility]   Report Status 11/14/2013 FINAL   Final  CULTURE, BLOOD (ROUTINE X 2)     Status: None   Collection Time    11/08/13 12:45 AM      Result Value Ref Range Status   Specimen Description BLOOD LEFT HAND   Final   Special Requests BOTTLES DRAWN AEROBIC AND ANAEROBIC 3CC   Final   Culture  Setup Time     Final   Value: 11/08/2013 03:31     Performed at [facility]   Culture     Final   Value: NO GROWTH 5 DAYS     Performed at [facility]   Report Status 11/14/2013 FINAL   Final  RESPIRATORY VIRUS PANEL     Status: None   Collection Time    11/11/13 12:00 AM      Result Value Ref Range Status   Source - RVPAN NASOPHARYNGEAL   Final   Respiratory Syncytial Virus A NOT DETECTED   Final   Respiratory Syncytial Virus B NOT DETECTED   Final   Influenza A NOT DETECTED   Final   Influenza B NOT DETECTED   Final   Parainfluenza 1 NOT DETECTED   Final   Parainfluenza 2 NOT DETECTED   Final   Parainfluenza 3 NOT DETECTED   Final   Metapneumovirus NOT DETECTED   Final   Rhinovirus NOT  DETECTED   Final   Adenovirus NOT DETECTED   Final   Influenza A H1 NOT DETECTED   Final   Influenza A H3 NOT DETECTED   Final   Comment: (NOTE)           Normal Reference Range for each Analyte: NOT DETECTED     Testing performed using the Luminex xTAG Respiratory Viral Panel test     kit.     This test was developed and its performance characteristics determined     by [facility]. It has not been cleared or approved by the [place]     Food and Drug Administration. This test is used for clinical purposes.     It should not be regarded as investigational or for research. This     laboratory is certified under the [place] (CLIA) as qualified to perform high complexity     clinical laboratory testing.     Performed at [facility], BLOOD (ROUTINE X 2)     Status: None   Collection Time    11/13/13  6:20 AM      Result Value Ref Range Status   Specimen Description BLOOD LEFT HAND   Final   Special Requests     Final   Value: BOTTLES DRAWN AEROBIC AND ANAEROBIC [place] [facility] 3CC ANA   Culture  Setup Time     Final   Value: 11/13/2013 08:35     Performed at [facility]   Culture     Final   Value:        BLOOD CULTURE RECEIVED NO GROWTH TO DATE CULTURE WILL BE HELD FOR 5 DAYS BEFORE ISSUING A FINAL NEGATIVE REPORT     Performed at [facility]   Report Status PENDING   Incomplete  CULTURE, BLOOD (ROUTINE X 2)     Status: None   Collection Time    11/13/13  6:35 AM      Result Value Ref Range Status   Specimen Description  BLOOD LEFT HAND   Final   Special Requests     Final   Value: BOTTLES DRAWN AEROBIC AND ANAEROBIC [place] [facility] 3CC ANA   Culture  Setup Time     Final   Value: 11/13/2013 08:35     Performed at [facility]   Culture     Final   Value:        BLOOD CULTURE RECEIVED NO GROWTH TO DATE CULTURE WILL BE HELD FOR 5 DAYS BEFORE ISSUING A FINAL NEGATIVE REPORT     Performed at [facility]   Report Status PENDING   Incomplete  MRSA PCR SCREENING     Status: None   Collection Time    11/13/13  6:04 PM      Result Value Ref Range Status   MRSA by PCR NEGATIVE  NEGATIVE Final   Comment:            The GeneXpert MRSA Assay (FDA     approved for NASAL specimens     only), is one component of a     comprehensive MRSA colonization     surveillance program. It is not     intended to diagnose MRSA     infection nor to guide or     monitor treatment for     MRSA infections.  URINE CULTURE     Status: None   Collection Time    11/13/13  8:55 PM      Result Value Ref Range Status   Specimen Description URINE, CATHETERIZED   Final   Special Requests NONE   Final   Culture  Setup Time     Final   Value: 11/14/2013 01:07     Performed at [facility]     Final   Value: NO GROWTH     Performed at [facility]   Culture     Final   Value: NO GROWTH     Performed at [facility]   Report Status 11/14/2013 FINAL   Final  CULTURE, EXPECTORATED SPUTUM-ASSESSMENT     Status: None   Collection Time    11/14/13  4:36 PM      Result Value Ref Range Status   Specimen Description SPUTUM   Final   Special Requests Immunocompromised   Final   Sputum evaluation     Final   Value: THIS SPECIMEN IS ACCEPTABLE. RESPIRATORY CULTURE REPORT TO FOLLOW.   Report Status 11/14/2013 FINAL   Final  CULTURE, RESPIRATORY (NON-EXPECTORATED)     Status: None   Collection Time    11/14/13  4:36 PM      Result Value Ref Range Status   Specimen Description SPUTUM   Final   Special Requests NONE   Final   Gram Stain     Final   Value: RARE WBC PRESENT, PREDOMINANTLY PMN     RARE SQUAMOUS EPITHELIAL CELLS PRESENT     NO ORGANISMS SEEN     Performed at [facility]   Culture     Final   Value: NORMAL OROPHARYNGEAL FLORA     Performed at [facility]   Report Status PENDING   Incomplete  HERPES SIMPLEX VIRUS CULTURE     Status: None    Collection Time    11/14/13  6:49 PM      Result Value Ref Range Status   Specimen Description MOUTH   Final   Special Requests Immunocompromised  Final   Culture     Final   Value: No Herpes Simplex Virus detected.     Performed at [facility]   Report Status 11/16/2013 FINAL   Final     Studies: [place] Chest  11/16/2013   CLINICAL DATA:  Bilateral pleural effusions.  EXAM: CHEST ULTRASOUND  COMPARISON:  CT scan dated 11/14/2013  FINDINGS: There is only a small subpulmonic right pleural effusion. Fluid volume was not felt to be adequate to safely perform thoracentesis.  IMPRESSION: Small right subpulmonic effusion.  Thoracentesis not performed.   Electronically Signed   By: Rozetta Nunnery M.D.   On: 11/16/2013 12:56    Scheduled Meds: . dextromethorphan-guaiFENesin  1 tablet Oral q morning - 10a  . DULoxetine  60 mg Oral QPM  . fluticasone  2 spray Each Nare Daily  . furosemide  60 mg Intravenous Once  . gabapentin  600 mg Oral BID  . hydrocortisone  30 mg Oral BID  . imipenem-cilastatin  500 mg Intravenous Q8H  . ipratropium-albuterol  3 mL Nebulization QID  . magic mouthwash w/lidocaine  10 mL Oral Q4H  . senna-docusate  2 tablet Oral TID  . sodium chloride  10-40 mL Intracatheter Q12H  . sodium chloride  3 mL Intravenous Q12H  . vancomycin  1,250 mg Intravenous Q24H   Continuous Infusions: . sodium chloride 100 mL/hr at 11/16/13 0508  . heparin 2,000 Units/hr (11/17/13 0134)    Principal Problem:   PNA (pneumonia) Active Problems:   Hodgkin lymphoma   Myelodysplasia   Adrenal abnormality   Rash and nonspecific skin eruption   DOE (dyspnea on exertion)   Symptomatic anemia   Melena   Adrenal infarction   Hypotension, unspecified   Sepsis   Acute renal failure    Time spent: 35 minutes.     Hagan Maltz  [facility] Pager [phone]. If 7PM-7AM, please contact night-coverage at [email or web address], password [facility] 11/17/2013, 7:53 AM  LOS: 10 days

## 2013-11-17 NOTE — Progress Notes (Signed)
CARE MANAGEMENT NOTE 11/17/2013  Patient:  XAVIERA, FLATEN   Account Number:  [id number]  Date Initiated:  11/09/2013  Documentation initiated by:  Gabriel Earing  Subjective/Objective Assessment:   pt admitted with cco abd pain, N, V     Action/Plan:   from home   Anticipated DC Date:  11/20/2013   Anticipated DC Plan:  HOME/SELF CARE         Choice offered to / List presented to:             Status of service:  In process, will continue to follow Medicare Important Message given?  NO (If response is "NO", the following Medicare IM given date fields will be blank) Date Medicare IM given:   Date Additional Medicare IM given:    Discharge Disposition:    Per UR Regulation:  Reviewed for med. necessity/level of care/duration of stay  If discussed at Central of Stay Meetings, dates discussed:   11/14/2013  11/16/2013    Comments:  [id number] Rosana Hoes, RN, BSN, CCM [phone] Chart Reviewed for discharge and hospital needs. Discharge needs at time of review:  None present will follow for needs. Review of patient progress due on [id number]. wbc greater than 20K, hgb 6.8,  [id number] Rosana Hoes, RN, BSN, [place], [phone] Chart reviewed for update of needs and condition. patient will have thoracentesis [id number] due to increased plueral effusion/platlets infusion today/platlets at 48.0/wbc-19.7/temp 110.4/hr131/rr29.  Continues to meet sdu criteria.   [id number] Rosana Hoes, RN, BSN, CCM, [phone] Chart reviewed for update of needs and condition patient continues to show elevated wbc -19.2, hgb7.9,poss thoracentesis  for pl effusion, main issue continues to be hcapna.temp 100 /teachycardia at 123/rr-27/s 02 sats at 100% . Being followed [facility] infectious diease.  [id number] Rosana Hoes, RN, BSN, CCM, [phone] Chart reviewed for update of needs and condition. patient transferred to sdu pm of [id number] due to hyperthermia, sepsis.  11/09/13  MMcGibboney, RN, BSN Chart reviewed.

## 2013-11-17 NOTE — Progress Notes (Signed)
ANTICOAGULATION CONSULT NOTE - Follow Up Consult  Pharmacy Consult for Lovenox Indication: VTE treatment  Allergies  Allergen Reactions  . Penicillins Hives and Rash    Patient Measurements: Height: 5' (152.4 cm) Weight: 226 lb 6.6 oz (102.7 kg) IBW/kg (Calculated) : 45.5 Heparin Dosing Weight: 69 kg  Vital Signs: Temp: 99.5 F (37.5 C) (02/20 0800) Temp src: Core (Comment) (02/20 0400) BP: 141/88 mmHg (02/20 0800) Pulse Rate: 110 (02/20 0800)  Labs:  Recent Labs  11/14/13 1040 11/14/13 1635  11/14/13 2140  11/15/13 0508  11/16/13 0524 11/16/13 1200 11/16/13 2000 11/17/13 0405  HGB  --   --   --   --   < > 7.9*  --  7.2*  --   --  6.8*  HCT  --   --   --   --   --  24.9*  --  22.2*  --   --  21.2*  PLT  --   --   --   --   --  PLATELET CLUMPS NOTED ON SMEAR, UNABLE TO ESTIMATE  --  48*  --   --  93*  HEPARINUNFRC  --   --   < >  --   --  0.13*  < > 0.56 <0.10* 0.34 0.57  CREATININE  --   --   --   --   --  1.61*  --  1.57*  --   --  1.49*  TROPONINI <0.30 <0.30  --  <0.30  --   --   --   --   --   --   --   < > = values in this interval not displayed.  Estimated Creatinine Clearance: 43.9 ml/min (by C-G formula based on Cr of 1.49).   Medications:  Infusions:  . sodium chloride 100 mL/hr at 11/16/13 0508  . heparin 2,000 Units/hr (11/17/13 0134)    Assessment: 49 yoF admitted 2/10 with N/V, abdominal pain, fever, and melena. PMH includes Hodgkin's lymphoma, s/p chemo, and myelodysplastic syndrome.  Doppler completed 2/15 and shows no DVT, but noted superficial thrombus in the right cephalic vein from [facility] to mid upper arm. Pharmacy consulted to dose IV Heparin on 2/17 for thrombus and MD suspects hypercoagulable status.  Pharmacy consulted to change IV heparin to Lovenox on 2/20  Hemoccult negative x1 on 11/05/2013   CBC: Hgb decreasing (8.5 --> 7.9 --> 7.2 --> 6.8), with plans for transfusion of PRBC today, 2/20.  Plt improved to 93 today.     MD is aware of  thrombocytopenia and anemia.  Pt received Aransep injection on 11/09/13 and 2 units PRBC on 2/13.  No active bleeding reported.  Rn reports no complications.  Heparin level 0.57, within the goal range.  SCr 1.49 with CrCl ~ 44 ml/min  Thoracentesis postponed on 2/19 with plans to reassess for enlarging effusions and possible thoracentesis in few days.   Goal of Therapy:  Anti-Xa level 0.6-1 units/ml 4hrs after LMWH dose given Monitor platelets by anticoagulation protocol: Yes   Plan:   D/C Heparin drip at 10 am, begin Lovenox 1 hour later at 1100  Lovenox 100mg  SQ q12h.  Follow up renal function and CBC  Continue to monitor H&H and platelets closely with known anemia and thrombocytopenia.  Follow up plan for thoracentesis   Gretta Arab PharmD, BCPS Pager [phone] 11/17/2013 8:49 AM

## 2013-11-17 NOTE — Progress Notes (Signed)
CRITICAL VALUE ALERT  Critical value received:  Hgb 6.8  Date of notification:  11/17/13  Time of notification:  0635  Critical value read back:yes  Nurse who received alert:  S.Young,RN  MD notified (1st page):  Jerilee Hoh  Time of first page:  [phone]  MD notified (2nd page):  Time of second page:  Responding MD:  T.Callahan,NP  Time MD responded:  [phone]

## 2013-11-17 NOTE — Progress Notes (Addendum)
[facility] for Infectious Disease    Date of Admission:  11/17/2013   Total days of antibiotics 11        Day 11 vanco        Day 5 acy        Day 5 imipenem(previously on aztreo/cipro)           ID: Lacey [name] is a 58 y.o. female  with hodgkin's lymphoma s/p chemo and auto BMT in May 8295, now complicated by treatment related myelodysplastic syndrome presents with intermittent fevers, leukocytosis, found to have adrenal infarcts and adrenal insufficiency, steroids started on 2/16.  Principal Problem:   PNA (pneumonia) Active Problems:   Hodgkin lymphoma   Myelodysplasia   Adrenal abnormality   Rash and nonspecific skin eruption   DOE (dyspnea on exertion)   Symptomatic anemia   Melena   Adrenal infarction   Hypotension, unspecified   Sepsis   Acute renal failure    Subjective: Afebrile, improved cough   Medications:  . dextromethorphan-guaiFENesin  1 tablet Oral q morning - 10a  . DULoxetine  60 mg Oral QPM  . enoxaparin (LOVENOX) injection  100 mg Subcutaneous BID  . fluticasone  2 spray Each Nare Daily  . gabapentin  600 mg Oral BID  . hydrocortisone  30 mg Oral BID  . imipenem-cilastatin  500 mg Intravenous Q8H  . ipratropium-albuterol  3 mL Nebulization QID  . magic mouthwash w/lidocaine  10 mL Oral Q4H  . senna-docusate  2 tablet Oral TID  . sodium chloride  10-40 mL Intracatheter Q12H  . sodium chloride  3 mL Intravenous Q12H  . vancomycin  1,250 mg Intravenous Q24H    Objective: Vital signs in last 24 hours: Temp:  [99 F (37.2 C)-100.2 F (37.9 C)] 99.1 F (37.3 C) (02/20 1200) Pulse Rate:  [110-134] 134 (02/20 1013) Resp:  [15-22] 22 (02/20 1200) BP: (113-145)/(70-101) 130/81 mmHg (02/20 1200) SpO2:  [91 %-96 %] 93 % (02/20 1306) Weight:  [226 lb 6.6 oz (102.7 kg)] 226 lb 6.6 oz (102.7 kg) (02/20 0400)  Constitutional: oriented to person, place, and time. appears well-developed and well-nourished. No distress.  HENT:    Mouth/Throat: Oropharynx is clear and moist. No oropharyngeal exudate. Numerous lesions on oral mucosa of lower lip  Cardiovascular: Normal rate, regular rhythm and normal heart sounds. Exam reveals no gallop and no friction rub.  No murmur heard.  Pulmonary/Chest: Effort normal and breath sounds normal. No respiratory distress. has no wheezes.  Abdominal: Soft. Bowel sounds are normal. exhibits no distension. There is no tenderness.  Lymphadenopathy: no cervical adenopathy.  Neurological: alert and oriented to person, place, and time.  Skin: Skin is warm and dry. No rash noted. No erythema.  Psychiatric: a normal mood and affect. behavior is normal.  Ext = right arm swelling and erythema to antecubital fossa with associated edema. Left arm picc line.   Lab Results  Recent Labs  11/16/13 0524 11/17/13 0405  WBC 19.7* 25.9*  HGB 7.2* 6.8*  HCT 22.2* 21.2*  NA 146 143  K 3.0* 3.4*  CL 112 111  CO2 18* 19  BUN 22 28*  CREATININE 1.57* 1.49*   Liver Panel  Recent Labs  11/16/13 0524 11/17/13 0405  PROT 6.3 6.4  ALBUMIN 1.9* 2.1*  AST 24 22  ALT 22 21  ALKPHOS 68 68  BILITOT 0.6 1.0   Sedimentation Rate No results found for this basename: ESRSEDRATE,  in the last 72 hours  C-Reactive Protein No results found for this basename: CRP,  in the last 72 hours  Microbiology: 2/11 blood cx ngtd  2/16 blood cx ngtd  2/16 urine cx ngtd 2/17 respiratory cx ngtd in length   Studies/Results: [place] Chest  11/16/2013   CLINICAL DATA:  Bilateral pleural effusions.  EXAM: CHEST ULTRASOUND  COMPARISON:  CT scan dated 11/14/2013  FINDINGS: There is only a small subpulmonic right pleural effusion. Fluid volume was not felt to be adequate to safely perform thoracentesis.  IMPRESSION: Small right subpulmonic effusion.  Thoracentesis not performed.   Electronically Signed   By: Rozetta Nunnery M.D.   On: 11/16/2013 12:56     Assessment/Plan: 58 y.o. female with hodgkin's lymphoma s/p chemo  and auto BMT in May 3664, now complicated by treatment related myelodysplastic syndrome presents with intermittent fevers, leukocytosis, found to have adrenal infarcts and adrenal insufficiency, steroids started on 2/16 and HCAP.  hcap = Would continue with vancomycin and imipenem for 14days. Doing extended course for immunocompromised host and slow response to therapy. Last day of treatment will be on Monday 11/20/13  Intermittent fevers =resolved. Thought to be due to adrenal insufficiency. Not completely clear that it is not infection. Work up has been negative, but most recent cultures were done while on antibiotics. Will get fungal blood cx if patient has repeat fevers and  Mouth ulcer =  hsv swab cx is negative , can discontinue with acyclovir   Empiric antifungals = appears that she is steadily improving. fungitell assay to see if elevated as sign of invasive fungal infection. and aspergillus antigen. Will not restart voriconazole  Dr. Johnnye Sima available for questions, will see patient on monday  Lacey [name], [facility] for Infectious Diseases Cell: [phone] Pager: [phone]  11/17/2013, 1:30 PM

## 2013-11-18 ENCOUNTER — Inpatient Hospital Stay (HOSPITAL_COMMUNITY): Payer: BC Managed Care – PPO

## 2013-11-18 DIAGNOSIS — I5032 Chronic diastolic (congestive) heart failure: Secondary | ICD-10-CM

## 2013-11-18 DIAGNOSIS — I509 Heart failure, unspecified: Secondary | ICD-10-CM

## 2013-11-18 DIAGNOSIS — E876 Hypokalemia: Secondary | ICD-10-CM

## 2013-11-18 DIAGNOSIS — I998 Other disorder of circulatory system: Secondary | ICD-10-CM

## 2013-11-18 DIAGNOSIS — K219 Gastro-esophageal reflux disease without esophagitis: Secondary | ICD-10-CM

## 2013-11-18 LAB — CBC WITH DIFFERENTIAL/PLATELET
Basophils Absolute: 0 10*3/uL (ref 0.0–0.1)
Basophils Relative: 0 % (ref 0–1)
EOS ABS: 0.2 10*3/uL (ref 0.0–0.7)
Eosinophils Relative: 1 % (ref 0–5)
HCT: 27.5 % — ABNORMAL LOW (ref 36.0–46.0)
HEMOGLOBIN: 9.1 g/dL — AB (ref 12.0–15.0)
Lymphocytes Relative: 21 % (ref 12–46)
Lymphs Abs: 4.7 10*3/uL — ABNORMAL HIGH (ref 0.7–4.0)
MCH: 30 pg (ref 26.0–34.0)
MCHC: 33.1 g/dL (ref 30.0–36.0)
MCV: 90.8 fL (ref 78.0–100.0)
Monocytes Absolute: 1.6 10*3/uL — ABNORMAL HIGH (ref 0.1–1.0)
Monocytes Relative: 7 % (ref 3–12)
NEUTROS PCT: 71 % (ref 43–77)
Neutro Abs: 15.8 10*3/uL — ABNORMAL HIGH (ref 1.7–7.7)
Platelets: 84 10*3/uL — ABNORMAL LOW (ref 150–400)
RBC: 3.03 MIL/uL — AB (ref 3.87–5.11)
RDW: 20.6 % — ABNORMAL HIGH (ref 11.5–15.5)
WBC: 22.3 10*3/uL — ABNORMAL HIGH (ref 4.0–10.5)
nRBC: 18 /100 WBC — ABNORMAL HIGH

## 2013-11-18 LAB — BASIC METABOLIC PANEL
BUN: 28 mg/dL — AB (ref 6–23)
CHLORIDE: 110 meq/L (ref 96–112)
CO2: 24 meq/L (ref 19–32)
Calcium: 8.1 mg/dL — ABNORMAL LOW (ref 8.4–10.5)
Creatinine, Ser: 1.24 mg/dL — ABNORMAL HIGH (ref 0.50–1.10)
GFR calc Af Amer: 54 mL/min — ABNORMAL LOW (ref >90)
GFR calc non Af Amer: 47 mL/min — ABNORMAL LOW (ref >90)
Glucose, Bld: 133 mg/dL — ABNORMAL HIGH (ref 70–99)
Potassium: 2.8 mEq/L — CL (ref 3.7–5.3)
Sodium: 146 mEq/L (ref 137–147)

## 2013-11-18 LAB — LACTATE DEHYDROGENASE: LDH: 1265 U/L — ABNORMAL HIGH (ref 94–250)

## 2013-11-18 LAB — COMPREHENSIVE METABOLIC PANEL
ALT: 15 U/L (ref 0–35)
AST: 17 U/L (ref 0–37)
Albumin: 2.1 g/dL — ABNORMAL LOW (ref 3.5–5.2)
Alkaline Phosphatase: 68 U/L (ref 39–117)
BUN: 31 mg/dL — AB (ref 6–23)
CALCIUM: 8.4 mg/dL (ref 8.4–10.5)
CO2: 24 mEq/L (ref 19–32)
Chloride: 107 mEq/L (ref 96–112)
Creatinine, Ser: 1.45 mg/dL — ABNORMAL HIGH (ref 0.50–1.10)
GFR calc Af Amer: 45 mL/min — ABNORMAL LOW (ref >90)
GFR calc non Af Amer: 39 mL/min — ABNORMAL LOW (ref >90)
Glucose, Bld: 122 mg/dL — ABNORMAL HIGH (ref 70–99)
Potassium: 2.4 mEq/L — CL (ref 3.7–5.3)
Sodium: 145 mEq/L (ref 137–147)
Total Bilirubin: 1.4 mg/dL — ABNORMAL HIGH (ref 0.3–1.2)
Total Protein: 6 g/dL (ref 6.0–8.3)

## 2013-11-18 LAB — RETICULOCYTES
RBC.: 3.03 MIL/uL — AB (ref 3.87–5.11)
RETIC COUNT ABSOLUTE: 33.3 10*3/uL (ref 19.0–186.0)
RETIC CT PCT: 1.1 % (ref 0.4–3.1)

## 2013-11-18 LAB — CK TOTAL AND CKMB (NOT AT ARMC)
CK, MB: 1 ng/mL (ref 0.3–4.0)
Total CK: 56 U/L (ref 7–177)

## 2013-11-18 LAB — MAGNESIUM: MAGNESIUM: 1.8 mg/dL (ref 1.5–2.5)

## 2013-11-18 LAB — TROPONIN I: Troponin I: <0.3 ng/mL (ref <0.30)

## 2013-11-18 MED ORDER — ACYCLOVIR 200 MG PO CAPS
400.0000 mg | ORAL_CAPSULE | Freq: Three times a day (TID) | ORAL | Status: DC
  Administered 2013-11-18 (×3): 400 mg via ORAL
  Filled 2013-11-18 (×6): qty 2

## 2013-11-18 MED ORDER — POTASSIUM CHLORIDE 10 MEQ/100ML IV SOLN: 10.0000 meq | INTRAVENOUS | Status: DC

## 2013-11-18 MED ORDER — LACTULOSE 10 GM/15ML PO SOLN
30.0000 g | Freq: Two times a day (BID) | ORAL | Status: DC
  Administered 2013-11-18 (×2): 30 g via ORAL
  Filled 2013-11-18 (×4): qty 45

## 2013-11-18 MED ORDER — POTASSIUM CHLORIDE 10 MEQ/50ML IV SOLN
10.0000 meq | INTRAVENOUS | Status: AC
  Administered 2013-11-18 (×6): 10 meq via INTRAVENOUS
  Filled 2013-11-18 (×6): qty 50

## 2013-11-18 MED ORDER — FUROSEMIDE 10 MG/ML IJ SOLN
60.0000 mg | Freq: Once | INTRAMUSCULAR | Status: DC
  Filled 2013-11-18: qty 6

## 2013-11-18 MED ORDER — POTASSIUM CHLORIDE CRYS ER 20 MEQ PO TBCR
40.0000 meq | EXTENDED_RELEASE_TABLET | Freq: Once | ORAL | Status: AC
  Administered 2013-11-18: 40 meq via ORAL
  Filled 2013-11-18: qty 2

## 2013-11-18 MED ORDER — FUROSEMIDE 10 MG/ML IJ SOLN
60.0000 mg | Freq: Once | INTRAMUSCULAR | Status: AC
  Administered 2013-11-18: 60 mg via INTRAVENOUS
  Filled 2013-11-18: qty 6

## 2013-11-18 MED ORDER — POTASSIUM CHLORIDE 10 MEQ/100ML IV SOLN
10.0000 meq | INTRAVENOUS | Status: AC
  Administered 2013-11-18 (×4): 10 meq via INTRAVENOUS
  Filled 2013-11-18 (×4): qty 100

## 2013-11-18 MED ORDER — FLUCONAZOLE 100 MG PO TABS
100.0000 mg | ORAL_TABLET | Freq: Every day | ORAL | Status: DC
  Administered 2013-11-18 – 2013-11-21 (×4): 100 mg via ORAL
  Filled 2013-11-18 (×6): qty 1

## 2013-11-18 MED ORDER — IPRATROPIUM-ALBUTEROL 0.5-2.5 (3) MG/3ML IN SOLN
3.0000 mL | Freq: Three times a day (TID) | RESPIRATORY_TRACT | Status: DC
  Administered 2013-11-18 – 2013-11-20 (×8): 3 mL via RESPIRATORY_TRACT
  Filled 2013-11-18 (×8): qty 3

## 2013-11-18 MED ORDER — PANTOPRAZOLE SODIUM 40 MG PO TBEC
40.0000 mg | DELAYED_RELEASE_TABLET | Freq: Two times a day (BID) | ORAL | Status: DC
  Administered 2013-11-18 – 2013-11-22 (×9): 40 mg via ORAL
  Filled 2013-11-18 (×14): qty 1

## 2013-11-18 NOTE — Progress Notes (Signed)
Clinical Social Work Department BRIEF PSYCHOSOCIAL ASSESSMENT 11/18/2013  Patient:  Lacey [name], Lacey [name]     Account Number:  [id number]     Admit date:  11/04/2013  Clinical Social Worker:  Levie Heritage  Date/Time:  11/18/2013 10:10 AM  Referred by:  Physician  Date Referred:  11/18/2013 Referred for  SNF Placement   Other Referral:   Interview type:  Patient Other interview type:    PSYCHOSOCIAL DATA Living Status:  FAMILY Admitted from facility:   Level of care:   Primary support name:  Harrell Gave Primary support relationship to patient:  SPOUSE Degree of support available:   strong    CURRENT CONCERNS Current Concerns  Post-Acute Placement   Other Concerns:    SOCIAL WORK ASSESSMENT / PLAN Met with Pt to discuss d/c plans.  Pt was resting comfortably.    Pt stated that she has very good family support but that she does feel that she needs SNF to improve her independence.  Pt stated that she's never been to SNF and CSW answered all questions and discussed SNF process.    Pt gave CSW permission to begin SNF search.    CSW thanked Pt for her time.   Assessment/plan status:  Psychosocial Support/Ongoing Assessment of Needs Other assessment/ plan:   Information/referral to community resources:   SNF list    PATIENT'S/FAMILY'S RESPONSE TO PLAN OF CARE: Pt was calm, cooperative and pleasant.  Pt stated that she and her family, including her cats and dogs, are coping well with her current medical situation.  She feels ok about going to a SNF, as she wants to be as independent as possible.    Pt thanked CSW for time and assistance.   Bernita Raisin, [place] Work [phone]

## 2013-11-18 NOTE — Progress Notes (Signed)
CRITICAL VALUE ALERT  Critical value received:  K+ 2.4  Date of notification:   [id number] Time of notification:  0655  Critical value read back: y  Nurse who received alert:  VMJ, RN  MD notified (1st page):  Fredirick Maudlin  Time of first page:  [phone]  MD notified (2nd page):  Time of second page:  Responding MD:  New orders given  Time MD responded:  [phone]

## 2013-11-18 NOTE — Progress Notes (Signed)
Clinical Social Work Department CLINICAL SOCIAL WORK PLACEMENT NOTE 11/18/2013  Patient:  NEIDRA, GIRVAN  Account Number:  [id number] Admit date:  10/30/2013  Clinical Social Worker:  Levie Heritage  Date/time:  11/18/2013 10:14 AM  Clinical Social Work is seeking post-discharge placement for this patient at the following level of care:   SKILLED NURSING   (*CSW will update this form in Epic as items are completed)   11/18/2013  Patient/family provided with [place] Department of Clinical Social Work's list of facilities offering this level of care within the geographic area requested by the patient (or if unable, by the patient's family).  11/18/2013  Patient/family informed of their freedom to choose among providers that offer the needed level of care, that participate in Medicare, Medicaid or managed care program needed by the patient, have an available bed and are willing to accept the patient.  11/18/2013  Patient/family informed of [facility]' ownership interest in [facility] [place], as well as of the fact that they are under no obligation to receive care at this facility.  PASARR submitted to EDS on 11/18/2013 PASARR number received from EDS on 11/18/2013  FL2 transmitted to all facilities in geographic area requested by pt/family on  11/18/2013 FL2 transmitted to all facilities within larger geographic area on   Patient informed that his/her managed care company has contracts with or will negotiate with  certain facilities, including the following:     Patient/family informed of bed offers received:   Patient chooses bed at  Physician recommends and patient chooses bed at    Patient to be transferred to  on   Patient to be transferred to facility by   The following physician request were entered in Epic:   Additional Comments:  Bernita Raisin, [place] Work [phone]

## 2013-11-18 NOTE — Progress Notes (Signed)
[facility] PROGRESS NOTE  Lacey [name] QMG:[id number] DOB: [birth year] DOA: 11/10/2013 PCP: Vena Austria, MD  Assessment/Plan:  1-Health Care Associated PNA; Patient presents with cough, fever. Repeat CT chest 2-17 showed: consolidation in the medial aspect of the right upper lobe. Partial loculation of the right pleural effusion anteriorly is also noted. Appreciate Dr Baxter Flattery help.  Influenza negative.  Aspergillus antigen no detected.  No enough fluid to allow thoracentesis.  Day 5 imipenem, day 9 Vancomycin. Plan to treat for 14 days.  WBC at 22. Afebrile.  Repeat chest x ray; Cardiomegaly without CHF . Persistent basilar atelectasis and small pleural effusions. Medial right upper lobe focal collapse/consolidation. Iv lasix.   2-Acute diastolic dysfunction; increase BNP, lasix 60 mg IV one time dose today ordered. Urine out put 2-20 at 8 L.   3-Acute Renal Failure; in setting of hypotension, contrast. Cr peak to 2.0. Improving with IV fluids. Cr has decrease to 1..4. Monitor. She will received IV lasix after blood transfusion.  -Cr stable at 1.4. Monitor on lasix.   4-Adrenal Insufficiency; Adrenal infarct/hemorrhage; Continue with Hydrocortisone. Change to oral 2-20.   5-Hypotension ? Sepsis ?; Hypotension could be explain by adrenal insufficiency but in setting of PNA, leukocytosis, fever sepsis is higly in the differential.  IV antibiotics.  Pro-calcitoni 175. Urine culture no growth, blood culture; no growth top date. Hypotension resolved.   6-Anemia; History of Hodgkin Lymphoma: Appreciate Dr Jonette Eva assistance. LDH increase to 737.. - Bone Marrow Biopsy: HYPERCELLULAR BONE MARROW FOR AGE WITH DYSPOIETIC CHANGES.  S/P 2 units of PRBC 2-20. Hb at 9.   7-Right upper extremity superficial thrombosis; continue with Lovenox.  8-Hypokalemia; replete IV and oral. Repeat B-met this afternoon.    Skin eruptions  -Resolving. Etiology unclear however if infectious  should be covered by broad-spectrum antibiotic  Mouth Ulcer;  Herpes virus culture no herpes detected. Received acyclovir for 3 days.   Soft tissue stranding in the anterior mediastinum is of uncertain etiology and significance. If there been recent attempted central line placement, this could be iatrogenic. Less likely, this might be infectious or inflammatory, and clinical correlation for signs and symptoms of mediastinitis may be warranted Unclear etiology.   Code Status: Full Code.  Family Communication: care discussed with patient.  Disposition Plan: PT consult.    Consultants: Dr. Burney Gauze (oncology)  Dr Gibson Ramp (Surgery) curbside consult  Dr. Carlyle Basques (infectious disease)   Procedures: 11/12/2013 right upper extremity Doppler  No evidence of deep vein thrombosis involving the right upper extremity, left subclavian vein, and left internal jugular vein. There is superficial thrombosis noted in the right cephalic vein.   -8/91 Bone marrow biopsy report pending  Echocardiogram 01/10/2013  - Left ventricle: mild focal basal hypertrophy of the septum.  -LVEF= ejection fraction was 55%. Wall motion was normal; there were no regional wall motion abnormalities.  -(grade 1 diastolic dysfunction).  - Aortic valve: There was no stenosis. Trivial regurgitation. - Mitral valve: Trivial regurgitation. - Left atrium: The atrium was mildly dilated. - Right ventricle: The cavity size was normal. Systolic function was normal. - Tricuspid valve: Peak RV-RA gradient: 65m Hg (S). - Pulmonary arteries: PA peak pressure: 228mHg (S). - Inferior vena cava: The vessel was normal in size; the respirophasic diameter changes were in the normal range (=50%); findings are consistent with normal central venous pressure.    Antibiotics: Aztreonam 2/11>> stopped 2/13  Ciprofloxacin 2/11>> stopped 2/15  Vancomycin 2/11>> stopped 2/15  Primaxin>> 2/16>>  Voriconazole 2/16>> stopped  2/18 Vancomycin 2/17>>   HPI/Subjective: SOB on exertion. Had panic attack last night. Feeling better this morning.   Objective: Filed Vitals:   11/18/13 0541  BP: 155/83  Pulse: 97  Temp: 98.5 F (36.9 C)  Resp: 18    Intake/Output Summary (Last 24 hours) at 11/18/13 1204 Last data filed at 11/18/13 0745  Gross per 24 hour  Intake 975.83 ml  Output   5450 ml  Net -4474.17 ml   Filed Weights   11/15/13 0500 11/16/13 0500 11/17/13 0400  Weight: 101.6 kg (223 lb 15.8 oz) 103.4 kg (227 lb 15.3 oz) 102.7 kg (226 lb 6.6 oz)    Exam:   General:  No distress.   Cardiovascular: S 1, S 2 RRR  Respiratory: decrease breath sounds on the right, no wheezes, no crackles.   Abdomen: Bs present, soft, NT  Musculoskeletal: right arm with swelling , redness, pulse present.   Data Reviewed: Basic Metabolic Panel:  Recent Labs Lab 11/13/13 0445 11/13/13 2313 11/14/13 0435 11/15/13 0508 11/16/13 0524 11/17/13 0405 11/18/13 0549  NA 139 139 139 143 146 143 145  K 3.3* 3.2* 3.9 3.2* 3.0* 3.4* 2.4*  CL 100 104 107 111 112 111 107  CO2 _0 18* 18* 19 24  GLUCOSE 126* 87 95 163* 141* 152* 122*  BUN _1 28* 31*  CREATININE 1.06 2.10* 1.90* 1.61* 1.57* 1.49* 1.45*  CALCIUM 8.3* 7.8* 7.1* 7.8* 8.2* 8.4 8.4  MG 1.7 1.6 1.6 1.9 1.8 1.9 1.8  PHOS  --  4.0  --   --   --   --   --    Liver Function Tests:  Recent Labs Lab 11/14/13 0435 11/15/13 0508 11/16/13 0524 11/17/13 0405 11/18/13 0549  AST 34 40* _2 ALT _3 ALKPHOS 64 70 68 68 68  BILITOT 0.3 0.3 0.6 1.0 1.4*  PROT 5.6* 6.3 6.3 6.4 6.0  ALBUMIN 1.7* 1.8* 1.9* 2.1* 2.1*   No results found for this basename: LIPASE, AMYLASE,  in the last 168 hours No results found for this basename: AMMONIA,  in the last 168 hours CBC:  Recent Labs Lab 11/14/13 0435 11/14/13 0840 11/15/13 0508 11/16/13 0524 11/17/13 0405 11/17/13 2305 11/18/13 0549  WBC 21.0*  --  19.2* 19.7* 25.9*  --   22.3*  NEUTROABS 17.4*  --  16.1* 15.7* 14.8*  --  15.8*  HGB 8.5*  --  7.9* 7.2* 6.8* 9.4* 9.1*  HCT 27.0*  --  24.9* 22.2* 21.2* 28.2* 27.5*  MCV 96.8  --  94.3 95.3 96.4  --  90.8  PLT PLATELET CLUMPS NOTED ON SMEAR, COUNT APPEARS DECREASED 46* PLATELET CLUMPS NOTED ON SMEAR, UNABLE TO ESTIMATE 48* 93*  --  84*   Cardiac Enzymes:  Recent Labs Lab 11/14/13 1040 11/14/13 1635 11/14/13 2140 11/18/13 0917  TROPONINI <0.30 <0.30 <0.30 <0.30   BNP (last 3 results)  Recent Labs  11/14/13 0435 11/17/13 0800  PROBNP 2977.0* 9541.0*   CBG: No results found for this basename: GLUCAP,  in the last 168 hours  Recent Results (from the past 240 hour(s))  RESPIRATORY VIRUS PANEL     Status: None   Collection Time    11/11/13 12:00 AM      Result Value Ref Range Status   Source - RVPAN NASOPHARYNGEAL   Final   Respiratory Syncytial Virus A NOT DETECTED   Final  Respiratory Syncytial Virus B NOT DETECTED   Final   Influenza A NOT DETECTED   Final   Influenza B NOT DETECTED   Final   Parainfluenza 1 NOT DETECTED   Final   Parainfluenza 2 NOT DETECTED   Final   Parainfluenza 3 NOT DETECTED   Final   Metapneumovirus NOT DETECTED   Final   Rhinovirus NOT DETECTED   Final   Adenovirus NOT DETECTED   Final   Influenza A H1 NOT DETECTED   Final   Influenza A H3 NOT DETECTED   Final   Comment: (NOTE)           Normal Reference Range for each Analyte: NOT DETECTED     Testing performed using the Luminex xTAG Respiratory Viral Panel test     kit.     This test was developed and its performance characteristics determined     by [facility]. It has not been cleared or approved by the [place]     Food and Drug Administration. This test is used for clinical purposes.     It should not be regarded as investigational or for research. This     laboratory is certified under the [place] (CLIA) as qualified to perform high complexity      clinical laboratory testing.     Performed at [facility], BLOOD (ROUTINE X 2)     Status: None   Collection Time    11/13/13  6:20 AM      Result Value Ref Range Status   Specimen Description BLOOD LEFT HAND   Final   Special Requests     Final   Value: BOTTLES DRAWN AEROBIC AND ANAEROBIC [place] [facility] 3CC ANA   Culture  Setup Time     Final   Value: 11/13/2013 08:35     Performed at [facility]   Culture     Final   Value:        BLOOD CULTURE RECEIVED NO GROWTH TO DATE CULTURE WILL BE HELD FOR 5 DAYS BEFORE ISSUING A FINAL NEGATIVE REPORT     Performed at [facility]   Report Status PENDING   Incomplete  CULTURE, BLOOD (ROUTINE X 2)     Status: None   Collection Time    11/13/13  6:35 AM      Result Value Ref Range Status   Specimen Description BLOOD LEFT HAND   Final   Special Requests     Final   Value: BOTTLES DRAWN AEROBIC AND ANAEROBIC [place] AER 3CC ANA   Culture  Setup Time     Final   Value: 11/13/2013 08:35     Performed at [facility]   Culture     Final   Value:        BLOOD CULTURE RECEIVED NO GROWTH TO DATE CULTURE WILL BE HELD FOR 5 DAYS BEFORE ISSUING A FINAL NEGATIVE REPORT     Performed at [facility]   Report Status PENDING   Incomplete  MRSA PCR SCREENING     Status: None   Collection Time    11/13/13  6:04 PM      Result Value Ref Range Status   MRSA by PCR NEGATIVE  NEGATIVE Final   Comment:            The GeneXpert MRSA Assay (FDA     approved for NASAL specimens  only), is one component of a     comprehensive MRSA colonization     surveillance program. It is not     intended to diagnose MRSA     infection nor to guide or     monitor treatment for     MRSA infections.  URINE CULTURE     Status: None   Collection Time    11/13/13  8:55 PM      Result Value Ref Range Status   Specimen Description URINE, CATHETERIZED   Final   Special Requests NONE   Final   Culture  Setup Time     Final   Value:  11/14/2013 01:07     Performed at [facility] Count     Final   Value: NO GROWTH     Performed at [facility]   Culture     Final   Value: NO GROWTH     Performed at [facility]   Report Status 11/14/2013 FINAL   Final  CULTURE, EXPECTORATED SPUTUM-ASSESSMENT     Status: None   Collection Time    11/14/13  4:36 PM      Result Value Ref Range Status   Specimen Description SPUTUM   Final   Special Requests Immunocompromised   Final   Sputum evaluation     Final   Value: THIS SPECIMEN IS ACCEPTABLE. RESPIRATORY CULTURE REPORT TO FOLLOW.   Report Status 11/14/2013 FINAL   Final  CULTURE, RESPIRATORY (NON-EXPECTORATED)     Status: None   Collection Time    11/14/13  4:36 PM      Result Value Ref Range Status   Specimen Description SPUTUM   Final   Special Requests NONE   Final   Gram Stain     Final   Value: RARE WBC PRESENT, PREDOMINANTLY PMN     RARE SQUAMOUS EPITHELIAL CELLS PRESENT     NO ORGANISMS SEEN     Performed at [facility]   Culture     Final   Value: NORMAL OROPHARYNGEAL FLORA     Performed at [facility]   Report Status 11/17/2013 FINAL   Final  HERPES SIMPLEX VIRUS CULTURE     Status: None   Collection Time    11/14/13  6:49 PM      Result Value Ref Range Status   Specimen Description MOUTH   Final   Special Requests Immunocompromised   Final   Culture     Final   Value: No Herpes Simplex Virus detected.     Performed at [facility]   Report Status 11/16/2013 FINAL   Final     Studies: Dg Chest 2 View  11/18/2013   CLINICAL DATA:  Heart failure, effusions  EXAM: CHEST  2 VIEW  COMPARISON:  11/14/2013  FINDINGS: Left PICC line tip in upper SVC level. Persistent right upper lobe hilar opacity compatible with atelectasis/ consolidation in the medial right upper lobe by CT. Heart remains enlarged. Basilar atelectasis noted. small pleural effusions appreciated on the lateral view. No pneumothorax.  Minimal interval change.  IMPRESSION: Cardiomegaly without CHF  Persistent basilar atelectasis and small pleural effusions  Medial right upper lobe focal collapse/consolidation.   Electronically Signed   By: Daryll Brod M.D.   On: 11/18/2013 09:46    Scheduled Meds: . acyclovir  400 mg Oral TID  . DULoxetine  60 mg Oral QPM  . enoxaparin (LOVENOX) injection  100 mg  Subcutaneous BID  . fluconazole  100 mg Oral Daily  . fluticasone  2 spray Each Nare Daily  . furosemide  60 mg Intravenous Once  . gabapentin  600 mg Oral BID  . hydrocortisone  30 mg Oral BID  . ipratropium-albuterol  3 mL Nebulization TID  . lactulose  30 g Oral BID  . magic mouthwash w/lidocaine  10 mL Oral Q4H  . pantoprazole  40 mg Oral BID  . potassium chloride  10 mEq Intravenous Q1 Hr x 6  . senna-docusate  2 tablet Oral TID  . sodium chloride  10-40 mL Intracatheter Q12H  . sodium chloride  3 mL Intravenous Q12H  . vancomycin  1,250 mg Intravenous Q24H   Continuous Infusions: . sodium chloride 100 mL/hr at 11/16/13 0508    Principal Problem:   PNA (pneumonia) Active Problems:   Hodgkin lymphoma   Myelodysplasia   Adrenal abnormality   Rash and nonspecific skin eruption   DOE (dyspnea on exertion)   Symptomatic anemia   Melena   Adrenal infarction   Hypotension, unspecified   Sepsis   Acute renal failure    Time spent: 30 minutes.     Clenton Esper  [facility] Pager [phone]. If 7PM-7AM, please contact night-coverage at [email or web address], password [email or web address] 11/18/2013, 12:04 PM  LOS: 11 days

## 2013-11-18 NOTE — Progress Notes (Signed)
Lacey [name] is now out of the ICU. She seems to be doing a little bit better. She to units of blood yesterday.  She urinated over 6 L of fluid. I still think she probably has some mild volume overload.  I'm not sure why her pro BNP is over 9000. Again this might be reflective of heart failure. She had an echocardiogram which showed decent cardiac function. I would get another chest x-ray on her today.  Her potassium was 2.4. This, no doubt, is from the diuresis. I will replace this with IV potassium.  Her LDH does continue to climb. I wonder if this might be some type of cardiac source. It is a reflector underlying bone marrow disorder.  She has reflux. We will get her back on Protonix.  Or oral ulcers look better. The acyclovir will help.  Her blood pressure has had no problems. She's on hydrocortisone. I'll put her on some Diflucan to help with any fungus infection.  She's use of Lovenox. Her right arm still is swollen but not as erythematous. I would continue her on Lovenox for now.  She is afebrile. Her blood pressure is 155/83. Her lungs sound better. She better air movement. Cardiac exam is regular in rhythm. Abdomen is soft, mildly obese. There is no tenderness. There is no palpable liver or spleen tip. Extremities shows the continued swelling of a right arm. She has some mild edema of the legs. Skin exam shows no rashes.  I still think that we have to continue to pull fluid off her. She was over 35 L positive. I think we need begin to even this out a little bit.   as far as her underlying bone marrow issue is concerned, if she continues to stabilize or improve over the weekend, I might entertain some therapy for her. We might be able to get a Port-A-Cath in.  Things are improving though still have a ways to go.  Physical therapy will be very important.  Use of her incentive spirometer will also be critical..  Pete E.  Psalm 34:1

## 2013-11-19 DIAGNOSIS — E8779 Other fluid overload: Secondary | ICD-10-CM

## 2013-11-19 DIAGNOSIS — E46 Unspecified protein-calorie malnutrition: Secondary | ICD-10-CM

## 2013-11-19 LAB — COMPREHENSIVE METABOLIC PANEL
ALT: 13 U/L (ref 0–35)
AST: 16 U/L (ref 0–37)
Albumin: 2.2 g/dL — ABNORMAL LOW (ref 3.5–5.2)
Alkaline Phosphatase: 65 U/L (ref 39–117)
BUN: 28 mg/dL — AB (ref 6–23)
CALCIUM: 8.1 mg/dL — AB (ref 8.4–10.5)
CO2: 29 meq/L (ref 19–32)
Chloride: 107 mEq/L (ref 96–112)
Creatinine, Ser: 1.15 mg/dL — ABNORMAL HIGH (ref 0.50–1.10)
GFR calc Af Amer: 59 mL/min — ABNORMAL LOW (ref >90)
GFR calc non Af Amer: 51 mL/min — ABNORMAL LOW (ref >90)
Glucose, Bld: 140 mg/dL — ABNORMAL HIGH (ref 70–99)
Potassium: 3.1 mEq/L — ABNORMAL LOW (ref 3.7–5.3)
Sodium: 147 mEq/L (ref 137–147)
Total Bilirubin: 1.3 mg/dL — ABNORMAL HIGH (ref 0.3–1.2)
Total Protein: 5.9 g/dL — ABNORMAL LOW (ref 6.0–8.3)

## 2013-11-19 LAB — CULTURE, BLOOD (ROUTINE X 2)
Culture: NO GROWTH
Culture: NO GROWTH

## 2013-11-19 LAB — CBC WITH DIFFERENTIAL/PLATELET
BASOS PCT: 0 % (ref 0–1)
Band Neutrophils: 1 % (ref 0–10)
Basophils Absolute: 0 10*3/uL (ref 0.0–0.1)
Blasts: 0 %
EOS ABS: 0.2 10*3/uL (ref 0.0–0.7)
EOS PCT: 1 % (ref 0–5)
HCT: 26.6 % — ABNORMAL LOW (ref 36.0–46.0)
HEMOGLOBIN: 9.1 g/dL — AB (ref 12.0–15.0)
LYMPHS PCT: 20 % (ref 12–46)
Lymphs Abs: 3.8 10*3/uL (ref 0.7–4.0)
MCH: 30.7 pg (ref 26.0–34.0)
MCHC: 34.2 g/dL (ref 30.0–36.0)
MCV: 89.9 fL (ref 78.0–100.0)
Metamyelocytes Relative: 5 %
Monocytes Absolute: 2.6 10*3/uL — ABNORMAL HIGH (ref 0.1–1.0)
Monocytes Relative: 14 % — ABNORMAL HIGH (ref 3–12)
Myelocytes: 3 %
NEUTROS ABS: 12.2 10*3/uL — AB (ref 1.7–7.7)
Neutrophils Relative %: 54 % (ref 43–77)
Platelets: 59 10*3/uL — ABNORMAL LOW (ref 150–400)
Promyelocytes Absolute: 2 %
RBC: 2.96 MIL/uL — AB (ref 3.87–5.11)
RDW: 20.7 % — ABNORMAL HIGH (ref 11.5–15.5)
WBC: 18.8 10*3/uL — ABNORMAL HIGH (ref 4.0–10.5)
nRBC: 16 /100 WBC — ABNORMAL HIGH

## 2013-11-19 LAB — RETICULOCYTES
RBC.: 2.96 MIL/uL — AB (ref 3.87–5.11)
RETIC COUNT ABSOLUTE: 26.6 10*3/uL (ref 19.0–186.0)
Retic Ct Pct: 0.9 % (ref 0.4–3.1)

## 2013-11-19 LAB — LACTATE DEHYDROGENASE: LDH: 1199 U/L — ABNORMAL HIGH (ref 94–250)

## 2013-11-19 LAB — MAGNESIUM: Magnesium: 1.7 mg/dL (ref 1.5–2.5)

## 2013-11-19 MED ORDER — POTASSIUM CHLORIDE CRYS ER 20 MEQ PO TBCR
40.0000 meq | EXTENDED_RELEASE_TABLET | Freq: Three times a day (TID) | ORAL | Status: DC
  Administered 2013-11-19 (×2): 40 meq via ORAL
  Filled 2013-11-19 (×3): qty 2

## 2013-11-19 MED ORDER — FUROSEMIDE 10 MG/ML IJ SOLN
40.0000 mg | Freq: Once | INTRAMUSCULAR | Status: AC
  Administered 2013-11-19: 40 mg via INTRAVENOUS
  Filled 2013-11-19: qty 4

## 2013-11-19 MED ORDER — SENNOSIDES-DOCUSATE SODIUM 8.6-50 MG PO TABS
2.0000 | ORAL_TABLET | Freq: Two times a day (BID) | ORAL | Status: DC | PRN
  Administered 2013-11-19 – 2013-11-26 (×2): 2 via ORAL
  Filled 2013-11-19 (×2): qty 2

## 2013-11-19 NOTE — Progress Notes (Signed)
[facility] PROGRESS NOTE  Lacey [name] TJQ:[id number] DOB: May 11, 1956 DOA: 11/11/2013 PCP: Vena Austria, MD  Assessment/Plan: 1-Health Care Associated PNA; Patient presents with cough, fever. Repeat CT chest 2-17 showed: consolidation in the medial aspect of the right upper lobe. Partial loculation of the right pleural effusion anteriorly is also noted. Appreciate Dr Baxter Flattery help.  Influenza negative.  Aspergillus antigen no detected.  No enough fluid to allow thoracentesis.  Day 6 imipenem, day 10 Vancomycin. Plan to treat for 14 days.  WBC at 18. Afebrile.  Repeat chest x ray; Cardiomegaly without CHF . Persistent basilar atelectasis and small pleural effusions. Medial right upper lobe focal collapse/consolidation. Iv lasix.  Ct chest ordered for tomorrow. Will discuss with Dr Marin Olp to do CT chest without contrast.   2-Acute diastolic dysfunction; increase BNP. Urine out put 2-21 at 8 L. IV lasix 40 mg IV. Replete KCl.   3-Acute Renal Failure; in setting of hypotension, contrast. Cr peak to 2.0. Improving with IV fluids. Cr has decrease to 1..4. Monitor. She will received IV lasix after blood transfusion.  -Cr stable at 1.1. Monitor on lasix.   4-Adrenal Insufficiency; Adrenal infarct/hemorrhage; Continue with Hydrocortisone. Change to oral 2-20.   5-Hypotension ? Sepsis ?; Hypotension could be explain by adrenal insufficiency but in setting of PNA, leukocytosis, fever sepsis is higly in the differential.  IV antibiotics.  Pro-calcitoni 175. Urine culture no growth, blood culture; no growth top date. Hypotension resolved.   6-Anemia; History of Hodgkin Lymphoma: Appreciate Dr Jonette Eva assistance. LDH increase to 737.. - Bone Marrow Biopsy: HYPERCELLULAR BONE MARROW FOR AGE WITH DYSPOIETIC CHANGES.  S/P 2 units of PRBC 2-20. Hb at 9.   7-Right upper extremity superficial thrombosis; continue with Lovenox. Swelling decreasing.  8-Hypokalemia; replete with 40 meq  times 3. Repeat b-met in am.     Skin eruptions  -Resolving. Etiology unclear however if infectious should be covered by broad-spectrum antibiotic  Mouth Ulcer;  Herpes virus culture no herpes detected. Received acyclovir for 3 days.   Soft tissue stranding in the anterior mediastinum is of uncertain etiology and significance. If there been recent attempted central line placement, this could be iatrogenic. Less likely, this might be infectious or inflammatory, and clinical correlation for signs and symptoms of mediastinitis may be warranted Unclear etiology.   Code Status: Full Code.  Family Communication: care discussed with patient.  Disposition Plan: PT consulted.    Consultants: Dr. Burney Gauze (oncology)  Dr Gibson Ramp (Surgery) curbside consult  Dr. Carlyle Basques (infectious disease)   Procedures: 11/12/2013 right upper extremity Doppler  No evidence of deep vein thrombosis involving the right upper extremity, left subclavian vein, and left internal jugular vein. There is superficial thrombosis noted in the right cephalic vein.   -7/62 Bone marrow biopsy report pending  Echocardiogram 01/10/2013  - Left ventricle: mild focal basal hypertrophy of the septum.  -LVEF= ejection fraction was 55%. Wall motion was normal; there were no regional wall motion abnormalities.  -(grade 1 diastolic dysfunction).  - Aortic valve: There was no stenosis. Trivial regurgitation. - Mitral valve: Trivial regurgitation. - Left atrium: The atrium was mildly dilated. - Right ventricle: The cavity size was normal. Systolic function was normal. - Tricuspid valve: Peak RV-RA gradient: 57m Hg (S). - Pulmonary arteries: PA peak pressure: 222mHg (S). - Inferior vena cava: The vessel was normal in size; the respirophasic diameter changes were in the normal range (=50%); findings are consistent with normal central venous pressure.  Antibiotics: Aztreonam 2/11>> stopped 2/13  Ciprofloxacin  2/11>> stopped 2/15  Vancomycin 2/11>> stopped 2/15  Primaxin>> 2/16>>  Voriconazole 2/16>> stopped 2/18 Vancomycin 2/17>>   HPI/Subjective: Feeling better. No SOB, cough improved.   Objective: Filed Vitals:   11/19/13 0528  BP: 124/75  Pulse: 101  Temp: 98.4 F (36.9 C)  Resp: 18    Intake/Output Summary (Last 24 hours) at 11/19/13 1244 Last data filed at 11/19/13 1115  Gross per 24 hour  Intake    743 ml  Output  10750 ml  Net -10007 ml   Filed Weights   11/16/13 0500 11/17/13 0400 11/19/13 0528  Weight: 103.4 kg (227 lb 15.3 oz) 102.7 kg (226 lb 6.6 oz) 97.977 kg (216 lb)    Exam:   General:  No distress.   Cardiovascular: S 1, S 2 RRR  Respiratory: decrease breath sounds on the right, no wheezes, no crackles.   Abdomen: Bs present, soft, NT  Musculoskeletal: right arm with swelling , redness, pulse present.   Data Reviewed: Basic Metabolic Panel:  Recent Labs Lab 11/13/13 0445 11/13/13 2313  11/15/13 7902 11/16/13 0524 11/17/13 0405 11/18/13 0549 11/18/13 1720 11/19/13 0400  NA 139 139  < > 143 146 143 145 146 147  K 3.3* 3.2*  < > 3.2* 3.0* 3.4* 2.4* 2.8* 3.1*  CL 100 104  < > 111 112 111 107 110 107  CO2 26 24  < > 18* 18* _0 GLUCOSE 126* 87  < > 163* 141* 152* 122* 133* 140*  BUN 12 16  < > 18 22 28* 31* 28* 28*  CREATININE 1.06 2.10*  < > 1.61* 1.57* 1.49* 1.45* 1.24* 1.15*  CALCIUM 8.3* 7.8*  < > 7.8* 8.2* 8.4 8.4 8.1* 8.1*  MG 1.7 1.6  < > 1.9 1.8 1.9 1.8  --  1.7  PHOS  --  4.0  --   --   --   --   --   --   --   < > = values in this interval not displayed. Liver Function Tests:  Recent Labs Lab 11/15/13 0508 11/16/13 0524 11/17/13 0405 11/18/13 0549 11/19/13 0400  AST 40* _1 ALT _2 ALKPHOS 70 68 68 68 65  BILITOT 0.3 0.6 1.0 1.4* 1.3*  PROT 6.3 6.3 6.4 6.0 5.9*  ALBUMIN 1.8* 1.9* 2.1* 2.1* 2.2*   No results found for this basename: LIPASE, AMYLASE,  in the last 168 hours No results found  for this basename: AMMONIA,  in the last 168 hours CBC:  Recent Labs Lab 11/15/13 0508 11/16/13 0524 11/17/13 0405 11/17/13 2305 11/18/13 0549 11/19/13 0400  WBC 19.2* 19.7* 25.9*  --  22.3* 18.8*  NEUTROABS 16.1* 15.7* 14.8*  --  15.8* 12.2*  HGB 7.9* 7.2* 6.8* 9.4* 9.1* 9.1*  HCT 24.9* 22.2* 21.2* 28.2* 27.5* 26.6*  MCV 94.3 95.3 96.4  --  90.8 89.9  PLT PLATELET CLUMPS NOTED ON SMEAR, UNABLE TO ESTIMATE 48* 93*  --  84* 59*   Cardiac Enzymes:  Recent Labs Lab 11/14/13 1040 11/14/13 1635 11/14/13 2140 11/18/13 0917  CKTOTAL  --   --   --  56  CKMB  --   --   --  1.0  TROPONINI <0.30 <0.30 <0.30 <0.30   BNP (last 3 results)  Recent Labs  11/14/13 0435 11/17/13 0800  PROBNP 2977.0* 9541.0*   CBG: No results found for this basename:  GLUCAP,  in the last 168 hours  Recent Results (from the past 240 hour(s))  RESPIRATORY VIRUS PANEL     Status: None   Collection Time    11/11/13 12:00 AM      Result Value Ref Range Status   Source - RVPAN NASOPHARYNGEAL   Final   Respiratory Syncytial Virus A NOT DETECTED   Final   Respiratory Syncytial Virus B NOT DETECTED   Final   Influenza A NOT DETECTED   Final   Influenza B NOT DETECTED   Final   Parainfluenza 1 NOT DETECTED   Final   Parainfluenza 2 NOT DETECTED   Final   Parainfluenza 3 NOT DETECTED   Final   Metapneumovirus NOT DETECTED   Final   Rhinovirus NOT DETECTED   Final   Adenovirus NOT DETECTED   Final   Influenza A H1 NOT DETECTED   Final   Influenza A H3 NOT DETECTED   Final   Comment: (NOTE)           Normal Reference Range for each Analyte: NOT DETECTED     Testing performed using the Luminex xTAG Respiratory Viral Panel test     kit.     This test was developed and its performance characteristics determined     by [facility]. It has not been cleared or approved by the [place]     Food and Drug Administration. This test is used for clinical purposes.     It should not be regarded as  investigational or for research. This     laboratory is certified under the Clinical Laboratory Improvement     Amendments of 1988 (CLIA) as qualified to perform high complexity     clinical laboratory testing.     Performed at [facility]  CULTURE, BLOOD (ROUTINE X 2)     Status: None   Collection Time    11/13/13  6:20 AM      Result Value Ref Range Status   Specimen Description BLOOD LEFT HAND   Final   Special Requests     Final   Value: BOTTLES DRAWN AEROBIC AND ANAEROBIC 7CC AER 3CC ANA   Culture  Setup Time     Final   Value: 11/13/2013 08:35     Performed at [facility]   Culture     Final   Value:        BLOOD CULTURE RECEIVED NO GROWTH TO DATE CULTURE WILL BE HELD FOR 5 DAYS BEFORE ISSUING A FINAL NEGATIVE REPORT     Performed at [facility]   Report Status PENDING   Incomplete  CULTURE, BLOOD (ROUTINE X 2)     Status: None   Collection Time    11/13/13  6:35 AM      Result Value Ref Range Status   Specimen Description BLOOD LEFT HAND   Final   Special Requests     Final   Value: BOTTLES DRAWN AEROBIC AND ANAEROBIC 7CC AER 3CC ANA   Culture  Setup Time     Final   Value: 11/13/2013 08:35     Performed at [facility]   Culture     Final   Value:        BLOOD CULTURE RECEIVED NO GROWTH TO DATE CULTURE WILL BE HELD FOR 5 DAYS BEFORE ISSUING A FINAL NEGATIVE REPORT     Performed at [facility]   Report Status PENDING   Incomplete  MRSA  PCR SCREENING     Status: None   Collection Time    11/13/13  6:04 PM      Result Value Ref Range Status   MRSA by PCR NEGATIVE  NEGATIVE Final   Comment:            The GeneXpert MRSA Assay (FDA     approved for NASAL specimens     only), is one component of a     comprehensive MRSA colonization     surveillance program. It is not     intended to diagnose MRSA     infection nor to guide or     monitor treatment for     MRSA infections.  URINE CULTURE     Status: None   Collection Time     11/13/13  8:55 PM      Result Value Ref Range Status   Specimen Description URINE, CATHETERIZED   Final   Special Requests NONE   Final   Culture  Setup Time     Final   Value: 11/14/2013 01:07     Performed at [facility] Count     Final   Value: NO GROWTH     Performed at [facility]   Culture     Final   Value: NO GROWTH     Performed at [facility]   Report Status 11/14/2013 FINAL   Final  CULTURE, EXPECTORATED SPUTUM-ASSESSMENT     Status: None   Collection Time    11/14/13  4:36 PM      Result Value Ref Range Status   Specimen Description SPUTUM   Final   Special Requests Immunocompromised   Final   Sputum evaluation     Final   Value: THIS SPECIMEN IS ACCEPTABLE. RESPIRATORY CULTURE REPORT TO FOLLOW.   Report Status 11/14/2013 FINAL   Final  CULTURE, RESPIRATORY (NON-EXPECTORATED)     Status: None   Collection Time    11/14/13  4:36 PM      Result Value Ref Range Status   Specimen Description SPUTUM   Final   Special Requests NONE   Final   Gram Stain     Final   Value: RARE WBC PRESENT, PREDOMINANTLY PMN     RARE SQUAMOUS EPITHELIAL CELLS PRESENT     NO ORGANISMS SEEN     Performed at [facility]   Culture     Final   Value: NORMAL OROPHARYNGEAL FLORA     Performed at [facility]   Report Status 11/17/2013 FINAL   Final  HERPES SIMPLEX VIRUS CULTURE     Status: None   Collection Time    11/14/13  6:49 PM      Result Value Ref Range Status   Specimen Description MOUTH   Final   Special Requests Immunocompromised   Final   Culture     Final   Value: No Herpes Simplex Virus detected.     Performed at [facility]   Report Status 11/16/2013 FINAL   Final     Studies: Dg Chest 2 View  11/18/2013   CLINICAL DATA:  Heart failure, effusions  EXAM: CHEST  2 VIEW  COMPARISON:  11/14/2013  FINDINGS: Left PICC line tip in upper SVC level. Persistent right upper lobe hilar opacity compatible with  atelectasis/ consolidation in the medial right upper lobe by CT. Heart remains enlarged. Basilar atelectasis noted. small pleural effusions appreciated on the lateral view.  No pneumothorax. Minimal interval change.  IMPRESSION: Cardiomegaly without CHF  Persistent basilar atelectasis and small pleural effusions  Medial right upper lobe focal collapse/consolidation.   Electronically Signed   By: Daryll Brod M.D.   On: 11/18/2013 09:46    Scheduled Meds: . DULoxetine  60 mg Oral QPM  . enoxaparin (LOVENOX) injection  100 mg Subcutaneous BID  . fluconazole  100 mg Oral Daily  . fluticasone  2 spray Each Nare Daily  . gabapentin  600 mg Oral BID  . hydrocortisone  30 mg Oral BID  . ipratropium-albuterol  3 mL Nebulization TID  . magic mouthwash w/lidocaine  10 mL Oral Q4H  . pantoprazole  40 mg Oral BID  . potassium chloride  40 mEq Oral TID  . sodium chloride  10-40 mL Intracatheter Q12H  . sodium chloride  3 mL Intravenous Q12H  . vancomycin  1,250 mg Intravenous Q24H   Continuous Infusions: . sodium chloride 100 mL/hr at 11/16/13 0508    Principal Problem:   PNA (pneumonia) Active Problems:   Hodgkin lymphoma   Myelodysplasia   Adrenal abnormality   Rash and nonspecific skin eruption   DOE (dyspnea on exertion)   Symptomatic anemia   Melena   Adrenal infarction   Hypotension, unspecified   Sepsis   Acute renal failure    Time spent: 30 minutes.     Zakiyyah Savannah  [facility] Pager [phone]. If 7PM-7AM, please contact night-coverage at [email or web address], password [facility] 11/19/2013, 12:44 PM  LOS: 12 days

## 2013-11-19 NOTE — Progress Notes (Signed)
Ms. Stribling continues to make some progress. She still diuresing very well. She put out 8 L yesterday. She still 20 L ahead and. I think that is a more diuresis would be okay. Her blood pressure still holding steady.  Her chest x-ray shows no congestive heart failure changes. There is this right upper lobe consolidation. Am not sure this really means. I think that another chest CT scan tomorrow would be appropriate.  Is no fever. Her mouth sores or healing up. We'll stop the acyclovir.  Again, all cultures have come back negative.  She is hypokalemic from the diuresis. She's gained IV potassium.  I think she is going to need physical therapy.  She has decreased swelling in the right arm. It does not look is tight. There is no erythema. She continues on Lovenox. I'll keep her on Lovenox for right now.  Her platelet count is down a little bit. At her reason for hospitalization just fluctuate.  There's no bleeding. She's going to the bathroom with her bowels very well. We'll cut back on the laxatives.  She is afebrile. Blood pressure is holding on nicely. Her lungs sound clear. Oral exam shows healing ulcers. There is no adenopathy in her neck. Cardiac exam regular rate and rhythm. Abdomen is soft. Slightly obese. Good bowel sounds. No fluid wave. Extremities shows 1+ edema in her legs. Neurological exam is nonfocal.  Some of the edema is from her low albumin. I will recheck a pro BNP on her. Her hemoglobin is 9.1. White cell count is trending down which is nice to see.  We still have a ways to go but she is making nice progress right now. Hopefully, the Foley catheter will be a come out of her tomorrow.  Remaining to get nutrition to see her. I think she is suffering from protein calorie malnutrition because of her blood disorder and recent hospitalization.  [name]  Exodus 14:14

## 2013-11-20 ENCOUNTER — Inpatient Hospital Stay (HOSPITAL_COMMUNITY): Payer: BC Managed Care – PPO

## 2013-11-20 DIAGNOSIS — M25559 Pain in unspecified hip: Secondary | ICD-10-CM

## 2013-11-20 LAB — COMPREHENSIVE METABOLIC PANEL
ALBUMIN: 2.2 g/dL — AB (ref 3.5–5.2)
ALT: 11 U/L (ref 0–35)
AST: 16 U/L (ref 0–37)
Alkaline Phosphatase: 63 U/L (ref 39–117)
BUN: 21 mg/dL (ref 6–23)
CALCIUM: 7.7 mg/dL — AB (ref 8.4–10.5)
CO2: 32 mEq/L (ref 19–32)
Chloride: 103 mEq/L (ref 96–112)
Creatinine, Ser: 0.99 mg/dL (ref 0.50–1.10)
GFR calc Af Amer: 71 mL/min — ABNORMAL LOW (ref >90)
GFR, EST NON AFRICAN AMERICAN: 61 mL/min — AB (ref >90)
Glucose, Bld: 133 mg/dL — ABNORMAL HIGH (ref 70–99)
Potassium: 3.1 mEq/L — ABNORMAL LOW (ref 3.7–5.3)
SODIUM: 144 meq/L (ref 137–147)
TOTAL PROTEIN: 5.5 g/dL — AB (ref 6.0–8.3)
Total Bilirubin: 1.2 mg/dL (ref 0.3–1.2)

## 2013-11-20 LAB — CBC WITH DIFFERENTIAL/PLATELET
Band Neutrophils: 2 % (ref 0–10)
Basophils Absolute: 0 10*3/uL (ref 0.0–0.1)
Basophils Relative: 0 % (ref 0–1)
Blasts: 0 %
EOS ABS: 0.4 10*3/uL (ref 0.0–0.7)
EOS PCT: 3 % (ref 0–5)
HCT: 24.7 % — ABNORMAL LOW (ref 36.0–46.0)
Hemoglobin: 8.1 g/dL — ABNORMAL LOW (ref 12.0–15.0)
Lymphocytes Relative: 19 % (ref 12–46)
Lymphs Abs: 2.5 10*3/uL (ref 0.7–4.0)
MCH: 29.8 pg (ref 26.0–34.0)
MCHC: 32.8 g/dL (ref 30.0–36.0)
MCV: 90.8 fL (ref 78.0–100.0)
METAMYELOCYTES PCT: 13 %
MYELOCYTES: 11 %
Monocytes Absolute: 0.7 10*3/uL (ref 0.1–1.0)
Monocytes Relative: 5 % (ref 3–12)
NRBC: 6 /100{WBCs} — AB
Neutro Abs: 9.5 10*3/uL — ABNORMAL HIGH (ref 1.7–7.7)
Neutrophils Relative %: 47 % (ref 43–77)
PLATELETS: 45 10*3/uL — AB (ref 150–400)
Promyelocytes Absolute: 0 %
RBC: 2.72 MIL/uL — ABNORMAL LOW (ref 3.87–5.11)
RDW: 20.5 % — ABNORMAL HIGH (ref 11.5–15.5)
WBC: 13.1 10*3/uL — ABNORMAL HIGH (ref 4.0–10.5)

## 2013-11-20 LAB — MISCELLANEOUS TEST

## 2013-11-20 LAB — PRO B NATRIURETIC PEPTIDE: PRO B NATRI PEPTIDE: 3178 pg/mL — AB (ref 0–125)

## 2013-11-20 LAB — RETICULOCYTES
RBC.: 2.72 MIL/uL — ABNORMAL LOW (ref 3.87–5.11)
RETIC COUNT ABSOLUTE: 24.5 10*3/uL (ref 19.0–186.0)
Retic Ct Pct: 0.9 % (ref 0.4–3.1)

## 2013-11-20 LAB — MAGNESIUM: Magnesium: 1.5 mg/dL (ref 1.5–2.5)

## 2013-11-20 LAB — LACTATE DEHYDROGENASE: LDH: 973 U/L — AB (ref 94–250)

## 2013-11-20 MED ORDER — POTASSIUM CHLORIDE 10 MEQ/50ML IV SOLN
10.0000 meq | INTRAVENOUS | Status: AC
  Administered 2013-11-20 (×4): 10 meq via INTRAVENOUS
  Filled 2013-11-20 (×6): qty 50

## 2013-11-20 MED ORDER — POTASSIUM CHLORIDE CRYS ER 20 MEQ PO TBCR: 40.0000 meq | EXTENDED_RELEASE_TABLET | Freq: Three times a day (TID) | ORAL | Status: DC

## 2013-11-20 MED ORDER — LIDOCAINE 5 % EX PTCH
1.0000 | MEDICATED_PATCH | CUTANEOUS | Status: DC
  Administered 2013-11-20 – 2013-11-26 (×5): 1 via TRANSDERMAL
  Filled 2013-11-20 (×8): qty 1

## 2013-11-20 MED ORDER — POTASSIUM CHLORIDE CRYS ER 20 MEQ PO TBCR
40.0000 meq | EXTENDED_RELEASE_TABLET | Freq: Two times a day (BID) | ORAL | Status: AC
  Administered 2013-11-20 (×2): 40 meq via ORAL
  Filled 2013-11-20 (×2): qty 2

## 2013-11-20 MED ORDER — TRAMADOL HCL 50 MG PO TABS
50.0000 mg | ORAL_TABLET | Freq: Three times a day (TID) | ORAL | Status: DC | PRN
  Administered 2013-11-20: 50 mg via ORAL
  Filled 2013-11-20: qty 1

## 2013-11-20 MED ORDER — FUROSEMIDE 10 MG/ML IJ SOLN
40.0000 mg | Freq: Once | INTRAMUSCULAR | Status: AC
  Administered 2013-11-20: 40 mg via INTRAVENOUS
  Filled 2013-11-20: qty 4

## 2013-11-20 NOTE — Progress Notes (Signed)
Pt has slept well all night.  Only a few short episodes of coughing.  Self turning in bed no c/o pain or distress noted

## 2013-11-20 NOTE — Progress Notes (Signed)
ANTICOAGULATION CONSULT NOTE - Follow Up Consult  Pharmacy Consult for Lovenox Indication: VTE treatment  Allergies  Allergen Reactions  . Penicillins Hives and Rash    Patient Measurements: Height: 5' (152.4 cm) Weight: 212 lb 9.6 oz (96.435 kg) IBW/kg (Calculated) : 45.5 Heparin Dosing Weight: 69 kg  Vital Signs: Temp: 98.7 F (37.1 C) (02/23 0522) Temp src: Oral (02/23 0522) BP: 115/69 mmHg (02/23 0522) Pulse Rate: 106 (02/23 0522)  Labs:  Recent Labs  11/18/13 0549 11/18/13 0917 11/18/13 1720 11/19/13 0400 11/20/13 0515  HGB 9.1*  --   --  9.1* 8.1*  HCT 27.5*  --   --  26.6* 24.7*  PLT 84*  --   --  59* 45*  CREATININE 1.45*  --  1.24* 1.15* 0.99  CKTOTAL  --  56  --   --   --   CKMB  --  1.0  --   --   --   TROPONINI  --  <0.30  --   --   --     Estimated Creatinine Clearance: 63.7 ml/min (by C-G formula based on Cr of 0.99).   Medications:  Infusions:  . sodium chloride 1,000 mL (11/19/13 1437)    Assessment: 23 yoF admitted 2/10 with N/V, abdominal pain, fever, and melena. PMH includes Hodgkin's lymphoma, s/p chemo, and myelodysplastic syndrome.  Doppler completed 2/15 and shows no DVT, but noted superficial thrombus in the right cephalic vein from [facility] to mid upper arm. Pharmacy consulted to dose IV Heparin on 2/17 for thrombus and MD suspects hypercoagulable status.  Pharmacy was consulted to change IV heparin to Lovenox on 2/20.  2/23:  Hgb falling (9.4 --> 9.1 --> 8.1)  Pltc falling (45K today)  Pt notes new pain L hip  Dr. Marin Olp is aware of above and has examined patient.  Plans to continue Lovenox for now but check CT hip to r/o bleeding.  SCr improved, WNL (0.99,  CrCl 64 mL/min).   Goal of Therapy:  Anti-Xa level 0.6-1 units/ml 4hrs after LMWH dose given Monitor platelets by anticoagulation protocol: Yes   Plan:  1.  No change to Lovenox dosage for now (100 mg SQ q12h) 2.  Await findings from CT L hip.  Clayburn Pert, PharmD,  BCPS Pager: [phone] 11/20/2013  8:40 AM

## 2013-11-20 NOTE — Progress Notes (Addendum)
PT Cancellation Note  Patient Details Name: Lacey [name] MRN: [id number] DOB: 15-Jun-1956   Cancelled Treatment:    Reason Eval/Treat Not Completed: Pain limiting ability to participate. Pt reports pain in L hip. Noted CT showed no fracture, but did show "stranding may represent edema, cellulitis or  Hemorrhage".     Blondell Reveal Kistler 11/20/2013, 10:53 AM [phone]

## 2013-11-20 NOTE — Progress Notes (Signed)
Lacey [name] now has a lot of pain in the left hip. This just started last night. She was out of bed yesterday. She no problems with this. There is no leg pain. The pain is in the lateral aspect of the left hip. It does not seem swollen when I palpate this area. We will see about doing a CT scan while she is the CT of her chest today.  She continues to diuresis very well. She had over 6 500 cc out yesterday. Her potassium is still a little on the low side. There is mild the potassium.  Her white cell count continues to come down. Her LDH is coming down. Her pro BNP is coming down. Blood pressure is holding steady. She is on hydrocortisone.  She continues on Lovenox for the superficial thrombus in the right arm. I probably would not switch her to Xarelto given her thrombocytopenia.  She is afebrile. Her antibodies can continue to be tapered off.  Her blood pressure is 115/69. Heart rate 106. Temperature is 98.7. Oral exam shows healing ulcers. Lungs show good breath sounds. She has good air movement. Cardiac exam is tachycardic but regular. Abdomen is soft. There is no liver or spleen. Hips do show some tenderness in the left lateral hip. She has decreased range of motion in this area. Legs show some mild pitting edema. There is still some swelling in the right arm. There is no erythema.  For now, we will have to see what the CT scan shows. I am not sure if there is any bleeding given her low platelets and Lovenox.  She should start some physical therapy today.  Her albumin is quite low so this will also lead to some edema.  I will like to think that she will be 02 go home soon. I would like to get a Port-A-Cath in her but I'm not sure this could be done because of her thrombocytopenia  Laurey Arrow e  Proverbs 21:21

## 2013-11-20 NOTE — Progress Notes (Signed)
Nutrition Brief Note  Patient identified via consult.   Wt Readings from Last 3 Encounters:  11/20/13 212 lb 9.6 oz (96.435 kg)  08/10/13 195 lb (88.451 kg)  07/06/13 199 lb (90.266 kg)    Body mass index is 41.52 kg/(m^2). Patient meets criteria for class III extreme obesity based on current BMI.   Current diet order is regular, patient is consuming approximately 100% of meals at this time. Labs and medications reviewed. Potassium low, getting IV replacement. Pt with history of Hodgkin's lymphoma s/p chemotherapy and treatment related myelodysplasia. Admitted with nausea, vomiting, and abdominal pain and was unable to eat/drink for 2 days PTA.   Met with pt who reports nausea/vomiting/abdominal pain resolved earlier in admission and before these symptoms occurred pt was eating well at home, 3 meals/day with stable weight. Pt currently eating excellent, 100% of meals, and denies any nutritional concerns at this time.   No nutrition interventions warranted at this time. If nutrition issues arise, please consult RD.   Mikey College MS, [place], [place] Pager [phone] After Hours Pager

## 2013-11-20 NOTE — Progress Notes (Signed)
[facility] PROGRESS NOTE  Lacey [name] WUJ:[id number] DOB: September 22, 1956 DOA: 11/15/2013 PCP: Vena Austria, MD  Assessment/Plan: 1-Health Care Associated PNA; Patient presents with cough, fever. Repeat CT chest 2-17 showed: consolidation in the medial aspect of the right upper lobe. Partial loculation of the right pleural effusion anteriorly is also noted. Appreciate Dr Baxter Flattery help.  Influenza negative.  Aspergillus antigen no detected.  No enough fluid to allow thoracentesis.  Day 7 imipenem, day 11 Vancomycin. Plan to treat for 14 days.  WBC at 13. Afebrile.  Repeat chest x ray; Cardiomegaly without CHF . Persistent basilar atelectasis and small pleural effusions. Medial right upper lobe focal collapse/consolidation. Iv lasix.  Ct chest; 2-23; persistent right middle lobe consolidation. Pleural effusion appears to be decreased in size.   2-Acute diastolic dysfunction; increase BNP. Urine out put 2-21 at 8 L. IV lasix 40 mg IV. Replete KCl.   3-Acute Renal Failure; in setting of hypotension, contrast. Cr peak to 2.0. Improving with IV fluids. Cr has decrease to 1..4. Monitor. She will received IV lasix after blood transfusion.  -Cr stable at 0.9. Monitor on lasix.   4-Adrenal Insufficiency; Adrenal infarct / hemorrhage; Continue with Hydrocortisone. Change to oral 2-20.   5-Hypotension ? Sepsis ?; Hypotension could be explain by adrenal insufficiency but in setting of PNA, leukocytosis, fever sepsis is higly in the differential.  IV antibiotics.  Pro-calcitoni 175. Urine culture no growth, blood culture; no growth top date. Hypotension resolved.   6-Anemia; History of Hodgkin Lymphoma: Appreciate Dr Jonette Eva assistance. LDH increase to 737.. - Bone Marrow Biopsy: HYPERCELLULAR BONE MARROW FOR AGE WITH DYSPOIETIC CHANGES.  S/P 2 units of PRBC 2-20. Hb at 9.   7-Right upper extremity superficial thrombosis; continue with Lovenox. Swelling decreasing.  8-Hypokalemia;  replete with 40 meq times 2. Repeat b-met in am. IV runs.  9-Left hip pain; ct showed edema. Skin is not red. Less likely cellulitis. Will order lidocaine patch.   Skin eruptions  -Resolving. Etiology unclear however if infectious should be covered by broad-spectrum antibiotic  Mouth Ulcer;  Herpes virus culture no herpes detected. Received acyclovir for 3 days.   Soft tissue stranding in the anterior mediastinum is of uncertain etiology and significance. If there been recent attempted central line placement, this could be iatrogenic. Less likely, this might be infectious or inflammatory, and clinical correlation for signs and symptoms of mediastinitis may be warranted Unclear etiology.   Code Status: Full Code.  Family Communication: care discussed with patient.  Disposition Plan: PT consulted.    Consultants: Dr. Burney Gauze (oncology)  Dr Gibson Ramp (Surgery) curbside consult  Dr. Carlyle Basques (infectious disease)   Procedures: 11/12/2013 right upper extremity Doppler  No evidence of deep vein thrombosis involving the right upper extremity, left subclavian vein, and left internal jugular vein. There is superficial thrombosis noted in the right cephalic vein.   -9/56 Bone marrow biopsy report pending  Echocardiogram 01/10/2013  - Left ventricle: mild focal basal hypertrophy of the septum.  -LVEF= ejection fraction was 55%. Wall motion was normal; there were no regional wall motion abnormalities.  -(grade 1 diastolic dysfunction).  - Aortic valve: There was no stenosis. Trivial regurgitation. - Mitral valve: Trivial regurgitation. - Left atrium: The atrium was mildly dilated. - Right ventricle: The cavity size was normal. Systolic function was normal. - Tricuspid valve: Peak RV-RA gradient: 79mm Hg (S). - Pulmonary arteries: PA peak pressure: 101mm Hg (S). - Inferior vena cava: The vessel was normal in size; the  respirophasic diameter changes were in the normal range  (=50%); findings are consistent with normal central venous pressure.    Antibiotics: Aztreonam 2/11>> stopped 2/13  Ciprofloxacin 2/11>> stopped 2/15  Vancomycin 2/11>> stopped 2/15  Primaxin>> 2/16>>  Voriconazole 2/16>> stopped 2/18 Vancomycin 2/17>>   HPI/Subjective: Feeling better. No SOB, cough improved.   Objective: Filed Vitals:   11/20/13 0522  BP: 115/69  Pulse: 106  Temp: 98.7 F (37.1 C)  Resp: 18    Intake/Output Summary (Last 24 hours) at 11/20/13 1224 Last data filed at 11/20/13 0920  Gross per 24 hour  Intake   2356 ml  Output   5350 ml  Net  -2994 ml   Filed Weights   11/17/13 0400 11/19/13 0528 11/20/13 0522  Weight: 102.7 kg (226 lb 6.6 oz) 97.977 kg (216 lb) 96.435 kg (212 lb 9.6 oz)    Exam:   General:  No distress.   Cardiovascular: S 1, S 2 RRR  Respiratory: decrease breath sounds on the right, no wheezes, no crackles.   Abdomen: Bs present, soft, NT.  Musculoskeletal: right arm with swelling , redness,   Data Reviewed: Basic Metabolic Panel:  Recent Labs Lab 11/13/13 2313  11/16/13 0524 11/17/13 0405 11/18/13 0549 11/18/13 1720 11/19/13 0400 11/20/13 0515  NA 139  < > 146 143 145 146 147 144  K 3.2*  < > 3.0* 3.4* 2.4* 2.8* 3.1* 3.1*  CL 104  < > 112 111 107 110 107 103  CO2 24  < > 18* $Rem'19 24 24 29 'qVtK$ 32  GLUCOSE 87  < > 141* 152* 122* 133* 140* 133*  BUN 16  < > 22 28* 31* 28* 28* 21  CREATININE 2.10*  < > 1.57* 1.49* 1.45* 1.24* 1.15* 0.99  CALCIUM 7.8*  < > 8.2* 8.4 8.4 8.1* 8.1* 7.7*  MG 1.6  < > 1.8 1.9 1.8  --  1.7 1.5  PHOS 4.0  --   --   --   --   --   --   --   < > = values in this interval not displayed. Liver Function Tests:  Recent Labs Lab 11/16/13 0524 11/17/13 0405 11/18/13 0549 11/19/13 0400 11/20/13 0515  AST $Re'24 22 17 16 16  'Fve$ ALT $R'22 21 15 13 11  'sU$ ALKPHOS 68 68 68 65 63  BILITOT 0.6 1.0 1.4* 1.3* 1.2  PROT 6.3 6.4 6.0 5.9* 5.5*  ALBUMIN 1.9* 2.1* 2.1* 2.2* 2.2*   No results found for this  basename: LIPASE, AMYLASE,  in the last 168 hours No results found for this basename: AMMONIA,  in the last 168 hours CBC:  Recent Labs Lab 11/16/13 0524 11/17/13 0405 11/17/13 2305 11/18/13 0549 11/19/13 0400 11/20/13 0515  WBC 19.7* 25.9*  --  22.3* 18.8* 13.1*  NEUTROABS 15.7* 14.8*  --  15.8* 12.2* 9.5*  HGB 7.2* 6.8* 9.4* 9.1* 9.1* 8.1*  HCT 22.2* 21.2* 28.2* 27.5* 26.6* 24.7*  MCV 95.3 96.4  --  90.8 89.9 90.8  PLT 48* 93*  --  84* 59* 45*   Cardiac Enzymes:  Recent Labs Lab 11/14/13 1040 11/14/13 1635 11/14/13 2140 11/18/13 0917  CKTOTAL  --   --   --  56  CKMB  --   --   --  1.0  TROPONINI <0.30 <0.30 <0.30 <0.30   BNP (last 3 results)  Recent Labs  11/14/13 0435 11/17/13 0800 11/20/13 0515  PROBNP 2977.0* 9541.0* 3178.0*   CBG: No results found for this  basename: GLUCAP,  in the last 168 hours  Recent Results (from the past 240 hour(s))  RESPIRATORY VIRUS PANEL     Status: None   Collection Time    11/11/13 12:00 AM      Result Value Ref Range Status   Source - RVPAN NASOPHARYNGEAL   Final   Respiratory Syncytial Virus A NOT DETECTED   Final   Respiratory Syncytial Virus B NOT DETECTED   Final   Influenza A NOT DETECTED   Final   Influenza B NOT DETECTED   Final   Parainfluenza 1 NOT DETECTED   Final   Parainfluenza 2 NOT DETECTED   Final   Parainfluenza 3 NOT DETECTED   Final   Metapneumovirus NOT DETECTED   Final   Rhinovirus NOT DETECTED   Final   Adenovirus NOT DETECTED   Final   Influenza A H1 NOT DETECTED   Final   Influenza A H3 NOT DETECTED   Final   Comment: (NOTE)           Normal Reference Range for each Analyte: NOT DETECTED     Testing performed using the Luminex xTAG Respiratory Viral Panel test     kit.     This test was developed and its performance characteristics determined     by [facility]. It has not been cleared or approved by the [place]     Food and Drug Administration. This test is used for clinical purposes.      It should not be regarded as investigational or for research. This     laboratory is certified under the [place] (CLIA) as qualified to perform high complexity     clinical laboratory testing.     Performed at [place], BLOOD (ROUTINE X 2)     Status: None   Collection Time    11/13/13  6:20 AM      Result Value Ref Range Status   Specimen Description BLOOD LEFT HAND   Final   Special Requests     Final   Value: BOTTLES DRAWN AEROBIC AND ANAEROBIC [place] AER 3CC ANA   Culture  Setup Time     Final   Value: 11/13/2013 08:35     Performed at [facility]   Culture     Final   Value: NO GROWTH 5 DAYS     Performed at [facility]   Report Status 11/19/2013 FINAL   Final  CULTURE, BLOOD (ROUTINE X 2)     Status: None   Collection Time    11/13/13  6:35 AM      Result Value Ref Range Status   Specimen Description BLOOD LEFT HAND   Final   Special Requests     Final   Value: BOTTLES DRAWN AEROBIC AND ANAEROBIC [place] AER 3CC ANA   Culture  Setup Time     Final   Value: 11/13/2013 08:35     Performed at [facility]   Culture     Final   Value: NO GROWTH 5 DAYS     Performed at [facility]   Report Status 11/19/2013 FINAL   Final  MRSA PCR SCREENING     Status: None   Collection Time    11/13/13  6:04 PM      Result Value Ref Range Status   MRSA by PCR NEGATIVE  NEGATIVE Final   Comment:  The GeneXpert MRSA Assay (FDA     approved for NASAL specimens     only), is one component of a     comprehensive MRSA colonization     surveillance program. It is not     intended to diagnose MRSA     infection nor to guide or     monitor treatment for     MRSA infections.  URINE CULTURE     Status: None   Collection Time    11/13/13  8:55 PM      Result Value Ref Range Status   Specimen Description URINE, CATHETERIZED   Final   Special Requests NONE   Final   Culture  Setup Time      Final   Value: 11/14/2013 01:07     Performed at [facility] Count     Final   Value: NO GROWTH     Performed at [facility]   Culture     Final   Value: NO GROWTH     Performed at [facility]   Report Status 11/14/2013 FINAL   Final  CULTURE, EXPECTORATED SPUTUM-ASSESSMENT     Status: None   Collection Time    11/14/13  4:36 PM      Result Value Ref Range Status   Specimen Description SPUTUM   Final   Special Requests Immunocompromised   Final   Sputum evaluation     Final   Value: THIS SPECIMEN IS ACCEPTABLE. RESPIRATORY CULTURE REPORT TO FOLLOW.   Report Status 11/14/2013 FINAL   Final  CULTURE, RESPIRATORY (NON-EXPECTORATED)     Status: None   Collection Time    11/14/13  4:36 PM      Result Value Ref Range Status   Specimen Description SPUTUM   Final   Special Requests NONE   Final   Gram Stain     Final   Value: RARE WBC PRESENT, PREDOMINANTLY PMN     RARE SQUAMOUS EPITHELIAL CELLS PRESENT     NO ORGANISMS SEEN     Performed at [facility]   Culture     Final   Value: NORMAL OROPHARYNGEAL FLORA     Performed at [facility]   Report Status 11/17/2013 FINAL   Final  HERPES SIMPLEX VIRUS CULTURE     Status: None   Collection Time    11/14/13  6:49 PM      Result Value Ref Range Status   Specimen Description MOUTH   Final   Special Requests Immunocompromised   Final   Culture     Final   Value: No Herpes Simplex Virus detected.     Performed at [facility]   Report Status 11/16/2013 FINAL   Final     Studies: Ct Chest Wo Contrast  11/20/2013   CLINICAL DATA:  Right upper lobe consolidation. Hodgkin lymphoma. Chemotherapy complete.  EXAM: CT CHEST WITHOUT CONTRAST  TECHNIQUE: Multidetector CT imaging of the chest was performed following the standard protocol without IV contrast.  COMPARISON:  DG CHEST 2 VIEW dated 11/18/2013; CT CHEST W/O CM dated 11/14/2013; CT ANGIO CHEST W/CM &/OR WO/CM dated  11/22/2013; CT ABD/PELVIS W CM dated 11/02/2013; NM PET IMAGE RESTAG (PS) SKULL BASE TO THIGH dated 11/12/2008  FINDINGS: Mediastinal lymph nodes are not enlarged by CT size criteria. Hilar regions are difficult to definitively evaluate without IV contrast. Axillary lymph nodes measure up to on the right. Vascular collaterals are seen along  the subcutaneous anterior right chest. Left PICC terminates at the SVC RA junction. Heart size normal. No pericardial effusion. Slight stranding in the prevascular space is again noted.  Collapse/consolidation appears to primarily involve the right middle lobe and is unchanged from 11/14/2013 but is new from 11/25/2013, at which time only patchy scarring was seen in the right upper lobe. Scattered patchy airspace opacities in the right upper and right lower lobes appear peribronchovascular nodularity and progressive from 11/14/2013. New peribronchovascular airspace disease in the left upper lobe (example image 20). Small right pleural effusion may have decreased minimally in size in the interval. Left pleural effusion is small in size and decreased from 11/14/2013. Minimal compressive atelectasis in the left lower lobe. There is apparent narrowing of the right middle lobe bronchus. Airway is otherwise unremarkable.  Incidental imaging of the upper abdomen several low-attenuation lesions in the liver, measuring up to 2.4 cm in the right hepatic lobe. These are grossly unchanged from 11/13/2013. Thickening and enlargement of both adrenal glands, as before. Visualized portions of the liver, gallbladder, spleen, pancreas, stomach and bowel are otherwise unremarkable. No upper abdominal adenopathy or free fluid. No worrisome lytic or sclerotic lesions.  IMPRESSION: 1. Persistent collapse/consolidation appears to primarily involve the right middle lobe and is likely due to pneumonia. Followup to clearing is recommended to exclude a centrally obstructing lesion. 2. New peribronchovascular  airspace opacities in the right upper and right lower lobes, most indicative of bronchopneumonia. 3. Small right pleural effusion, possibly minimally decreased from prior. Small left pleural effusion, decreased from prior. 4. Bilateral adrenal thickening/enlargement, as before, and possibly indicative of adrenal hemorrhage.   Electronically Signed   By: Leanna Battles M.D.   On: 11/20/2013 10:30   Ct Hip Left Wo Contrast  11/20/2013   CLINICAL DATA:  Severe left hip pain.  Hemorrhage.  EXAM: CT OF THE LEFT HIP WITHOUT CONTRAST  TECHNIQUE: Multidetector CT imaging was performed according to the standard protocol. Multiplanar CT image reconstructions were also generated.  COMPARISON:  None.  FINDINGS: Left acetabulum and left femoral head are within normal limits. No gross hip effusion is present. There is no fracture. Stranding is present in the proximal left thigh, partially visualized. This appears prominent anteriorly. There is stranding in the fat along the left external iliac vessels which may represent a small amount of hemorrhage or edema. No intramuscular hematomas identified. The obturator rings are intact. Gluteal muscles appear normal.  IMPRESSION: Nonspecific stranding in the anterior proximal left thigh, anterior to the left hip and along the left external iliac vessels. No hematoma. The stranding may represent edema, cellulitis or hemorrhage if prior instrumentation.   Electronically Signed   By: Andreas Newport M.D.   On: 11/20/2013 10:28    Scheduled Meds: . DULoxetine  60 mg Oral QPM  . enoxaparin (LOVENOX) injection  100 mg Subcutaneous BID  . fluconazole  100 mg Oral Daily  . fluticasone  2 spray Each Nare Daily  . furosemide  40 mg Intravenous Once  . gabapentin  600 mg Oral BID  . hydrocortisone  30 mg Oral BID  . ipratropium-albuterol  3 mL Nebulization TID  . lidocaine  1 patch Transdermal Q24H  . magic mouthwash w/lidocaine  10 mL Oral Q4H  . pantoprazole  40 mg Oral BID  .  potassium chloride  10 mEq Intravenous Q1 Hr x 6  . potassium chloride  40 mEq Oral BID  . sodium chloride  10-40 mL Intracatheter Q12H  .  sodium chloride  3 mL Intravenous Q12H  . vancomycin  1,250 mg Intravenous Q24H   Continuous Infusions: . sodium chloride 1,000 mL (11/19/13 1437)    Principal Problem:   PNA (pneumonia) Active Problems:   Hodgkin lymphoma   Myelodysplasia   Adrenal abnormality   Rash and nonspecific skin eruption   DOE (dyspnea on exertion)   Symptomatic anemia   Melena   Adrenal infarction   Hypotension, unspecified   Sepsis   Acute renal failure    Time spent: 30 minutes.     Llewellyn Choplin  [facility] Pager [phone]. If 7PM-7AM, please contact night-coverage at [email or web address], password [facility] 11/20/2013, 12:24 PM  LOS: 13 days

## 2013-11-20 NOTE — Progress Notes (Signed)
[place] for Infectious Disease    Date of Admission:  2013/11/21   Total days of antibiotics 14        Day 14 vanco        Day 5 acy        Day 5 imipenem(previously on aztreo/cipro)           ID: Lacey [name] is a 58 y.o. 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  Principal Problem:   PNA (pneumonia) Active Problems:   Hodgkin lymphoma   Myelodysplasia   Adrenal abnormality   Rash and nonspecific skin eruption   DOE (dyspnea on exertion)   Symptomatic anemia   Melena   Adrenal infarction   Hypotension, unspecified   Sepsis   Acute renal failure    Subjective: Afebrile, improved cough   Medications:  . DULoxetine  60 mg Oral QPM  . enoxaparin (LOVENOX) injection  100 mg Subcutaneous BID  . fluconazole  100 mg Oral Daily  . fluticasone  2 spray Each Nare Daily  . gabapentin  600 mg Oral BID  . hydrocortisone  30 mg Oral BID  . ipratropium-albuterol  3 mL Nebulization TID  . lidocaine  1 patch Transdermal Q24H  . magic mouthwash w/lidocaine  10 mL Oral Q4H  . pantoprazole  40 mg Oral BID  . potassium chloride  40 mEq Oral BID  . sodium chloride  10-40 mL Intracatheter Q12H  . sodium chloride  3 mL Intravenous Q12H  . vancomycin  1,250 mg Intravenous Q24H    Objective: Vital signs in last 24 hours: Temp:  [98.3 F (36.8 C)-98.7 F (37.1 C)] 98.3 F (36.8 C) (02/23 1418) Pulse Rate:  [102-107] 104 (02/23 1418) Resp:  [16-18] 16 (02/23 1400) BP: (113-156)/(62-78) 113/62 mmHg (02/23 1418) SpO2:  [91 %-97 %] 95 % (02/23 1418) Weight:  [212 lb 9.6 oz (96.435 kg)] 212 lb 9.6 oz (96.435 kg) (02/23 0522)  Constitutional: oriented to person, place, and time. appears well-developed and well-nourished. No distress.  HENT:    Mouth/Throat: Oropharynx is clear and moist. No oropharyngeal exudate. Numerous lesions on oral mucosa of lower lip  Cardiovascular: Normal rate, regular rhythm and normal heart sounds. Exam reveals no gallop and no friction rub.  No murmur heard.  Pulmonary/Chest: Effort normal and breath sounds normal. No respiratory distress. has no wheezes.  Abdominal: Soft. Bowel sounds are normal. exhibits no distension. There is no tenderness.  Lymphadenopathy: no cervical adenopathy.  Neurological: alert and oriented to person, place, and time.  Skin: Skin is warm and dry. No rash noted. No erythema.  Psychiatric: a normal mood and affect. behavior is normal.  Ext = right arm swelling and erythema to antecubital fossa with associated edema. Left arm picc line.   Lab Results  Recent Labs  11/19/13 0400 11/20/13 0515  WBC 18.8* 13.1*  HGB 9.1* 8.1*  HCT 26.6* 24.7*  NA 147 144  K 3.1* 3.1*  CL 107 103  CO2 29 32  BUN 28* 21  CREATININE 1.15* 0.99   Liver Panel  Recent Labs  11/19/13 0400 11/20/13 0515  PROT 5.9* 5.5*  ALBUMIN 2.2* 2.2*  AST 16 16  ALT 13 11  ALKPHOS 65 63  BILITOT 1.3* 1.2   Sedimentation Rate No results found for this basename: ESRSEDRATE,  in the last 72 hours C-Reactive Protein No results found for this basename: CRP,  in the last 72 hours  Microbiology: 2/11 blood cx ngtd  2/16 blood cx ngtd  2/16 urine cx ngtd 2/17 respiratory cx ngtd in length   Studies/Results: Ct Chest Wo Contrast  11/20/2013   CLINICAL DATA:  Right upper lobe consolidation. Hodgkin lymphoma. Chemotherapy complete.  EXAM: CT CHEST WITHOUT CONTRAST  TECHNIQUE: Multidetector CT imaging of the chest was performed following the standard protocol without IV contrast.  COMPARISON:  DG CHEST 2 VIEW dated 11/18/2013; CT CHEST W/O CM dated 11/14/2013; CT ANGIO CHEST W/CM &/OR WO/CM dated 11/01/2013; CT ABD/PELVIS W CM dated 11/08/2013; NM PET IMAGE RESTAG (PS) SKULL BASE TO THIGH dated  11/12/2008  FINDINGS: Mediastinal lymph nodes are not enlarged by CT size criteria. Hilar regions are difficult to definitively evaluate without IV contrast. Axillary lymph nodes measure up to on the right. Vascular collaterals are seen along the subcutaneous anterior right chest. Left PICC terminates at the SVC RA junction. Heart size normal. No pericardial effusion. Slight stranding in the prevascular space is again noted.  Collapse/consolidation appears to primarily involve the right middle lobe and is unchanged from 11/14/2013 but is new from 11/02/2013, at which time only patchy scarring was seen in the right upper lobe. Scattered patchy airspace opacities in the right upper and right lower lobes appear peribronchovascular nodularity and progressive from 11/14/2013. New peribronchovascular airspace disease in the left upper lobe (example image 20). Small right pleural effusion may have decreased minimally in size in the interval. Left pleural effusion is small in size and decreased from 11/14/2013. Minimal compressive atelectasis in the left lower lobe. There is apparent narrowing of the right middle lobe bronchus. Airway is otherwise unremarkable.  Incidental imaging of the upper abdomen several low-attenuation lesions in the liver, measuring up to 2.4 cm in the right hepatic lobe. These are grossly unchanged from 11/16/2013. Thickening and enlargement of both adrenal glands, as before. Visualized portions of the liver, gallbladder, spleen, pancreas, stomach and bowel are otherwise unremarkable. No upper abdominal adenopathy or free fluid. No worrisome lytic or sclerotic lesions.  IMPRESSION: 1. Persistent collapse/consolidation appears to primarily involve the right middle lobe and is likely due to pneumonia. Followup to clearing is recommended to exclude a centrally obstructing lesion. 2. New peribronchovascular airspace opacities in the right upper and right lower lobes, most indicative of bronchopneumonia.  3. Small right pleural effusion, possibly minimally decreased from prior. Small left pleural effusion, decreased from prior. 4. Bilateral adrenal thickening/enlargement, as before, and possibly indicative of adrenal hemorrhage.   Electronically Signed   By: Lorin Picket M.D.   On: 11/20/2013 10:30   Ct Hip Left Wo Contrast  11/20/2013   CLINICAL DATA:  Severe left hip pain.  Hemorrhage.  EXAM: CT OF THE LEFT HIP WITHOUT CONTRAST  TECHNIQUE: Multidetector CT imaging was performed according to the standard protocol. Multiplanar CT image reconstructions were also generated.  COMPARISON:  None.  FINDINGS: Left acetabulum and left femoral head are within normal limits. No gross hip effusion is present. There is no fracture. Stranding is present in the proximal left thigh, partially visualized. This appears prominent anteriorly. There is stranding in the fat along the left external iliac vessels which may represent a small amount of hemorrhage or edema. No intramuscular hematomas identified. The obturator rings are intact. Gluteal muscles appear normal.  IMPRESSION: Nonspecific stranding in the anterior proximal left thigh, anterior to the left hip and along  the left external iliac vessels. No hematoma. The stranding may represent edema, cellulitis or hemorrhage if prior instrumentation.   Electronically Signed   By: Dereck Ligas M.D.   On: 11/20/2013 10:28     Assessment/Plan: 58 y.o. 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  hcap =  Last day of treatment is today, on Monday 11/20/13  Intermittent fevers =resolved. Thought to be due to adrenal insufficiency. Not completely clear that it is not infection. Work up has been negative, but most recent cultures were done while on  antibiotics. Aspergillus, B-D glucan negative for invasive fungal infection  Will sign off  [place], [facility] Cell: [phone] Pager: [phone]  11/20/2013, 4:47 PM

## 2013-11-21 ENCOUNTER — Inpatient Hospital Stay (HOSPITAL_COMMUNITY): Payer: BC Managed Care – PPO

## 2013-11-21 ENCOUNTER — Encounter (HOSPITAL_COMMUNITY): Payer: Self-pay | Admitting: Radiology

## 2013-11-21 DIAGNOSIS — J9819 Other pulmonary collapse: Secondary | ICD-10-CM

## 2013-11-21 DIAGNOSIS — R1110 Vomiting, unspecified: Secondary | ICD-10-CM

## 2013-11-21 DIAGNOSIS — R1032 Left lower quadrant pain: Secondary | ICD-10-CM

## 2013-11-21 LAB — COMPREHENSIVE METABOLIC PANEL
ALBUMIN: 2.3 g/dL — AB (ref 3.5–5.2)
ALT: 13 U/L (ref 0–35)
AST: 21 U/L (ref 0–37)
Alkaline Phosphatase: 71 U/L (ref 39–117)
BUN: 19 mg/dL (ref 6–23)
CALCIUM: 7.9 mg/dL — AB (ref 8.4–10.5)
CO2: 31 mEq/L (ref 19–32)
Chloride: 99 mEq/L (ref 96–112)
Creatinine, Ser: 1 mg/dL (ref 0.50–1.10)
GFR calc Af Amer: 70 mL/min — ABNORMAL LOW (ref >90)
GFR calc non Af Amer: 60 mL/min — ABNORMAL LOW (ref >90)
GLUCOSE: 163 mg/dL — AB (ref 70–99)
POTASSIUM: 3.4 meq/L — AB (ref 3.7–5.3)
SODIUM: 140 meq/L (ref 137–147)
TOTAL PROTEIN: 5.8 g/dL — AB (ref 6.0–8.3)
Total Bilirubin: 1.1 mg/dL (ref 0.3–1.2)

## 2013-11-21 LAB — CBC WITH DIFFERENTIAL/PLATELET
BASOS ABS: 0.3 10*3/uL — AB (ref 0.0–0.1)
Basophils Relative: 2 % — ABNORMAL HIGH (ref 0–1)
Eosinophils Absolute: 0.4 10*3/uL (ref 0.0–0.7)
Eosinophils Relative: 3 % (ref 0–5)
HCT: 22.8 % — ABNORMAL LOW (ref 36.0–46.0)
Hemoglobin: 7.5 g/dL — ABNORMAL LOW (ref 12.0–15.0)
LYMPHS PCT: 28 % (ref 12–46)
Lymphs Abs: 3.5 10*3/uL (ref 0.7–4.0)
MCH: 30.4 pg (ref 26.0–34.0)
MCHC: 32.9 g/dL (ref 30.0–36.0)
MCV: 92.3 fL (ref 78.0–100.0)
MONOS PCT: 2 % — AB (ref 3–12)
Monocytes Absolute: 0.3 10*3/uL (ref 0.1–1.0)
NEUTROS ABS: 8.1 10*3/uL — AB (ref 1.7–7.7)
NEUTROS PCT: 65 % (ref 43–77)
PLATELETS: 32 10*3/uL — AB (ref 150–400)
RBC: 2.47 MIL/uL — ABNORMAL LOW (ref 3.87–5.11)
RDW: 20.2 % — ABNORMAL HIGH (ref 11.5–15.5)
WBC: 12.6 10*3/uL — AB (ref 4.0–10.5)

## 2013-11-21 LAB — RETICULOCYTES
RBC.: 2.47 MIL/uL — ABNORMAL LOW (ref 3.87–5.11)
RETIC COUNT ABSOLUTE: 17.3 10*3/uL — AB (ref 19.0–186.0)
Retic Ct Pct: 0.7 % (ref 0.4–3.1)

## 2013-11-21 LAB — PROTIME-INR
INR: 1.27 (ref 0.00–1.49)
Prothrombin Time: 15.6 seconds — ABNORMAL HIGH (ref 11.6–15.2)

## 2013-11-21 LAB — TYPE AND SCREEN
ABO/RH(D): O POS
ANTIBODY SCREEN: POSITIVE
DONOR AG TYPE: NEGATIVE
Donor AG Type: NEGATIVE
Unit division: 0
Unit division: 0

## 2013-11-21 LAB — APTT: aPTT: 52 seconds — ABNORMAL HIGH (ref 24–37)

## 2013-11-21 LAB — MAGNESIUM: MAGNESIUM: 1.5 mg/dL (ref 1.5–2.5)

## 2013-11-21 LAB — LACTATE DEHYDROGENASE: LDH: 839 U/L — AB (ref 94–250)

## 2013-11-21 MED ORDER — DIPHENHYDRAMINE HCL 25 MG PO CAPS
25.0000 mg | ORAL_CAPSULE | Freq: Once | ORAL | Status: AC
  Administered 2013-11-21: 25 mg via ORAL
  Filled 2013-11-21: qty 1

## 2013-11-21 MED ORDER — IOHEXOL 300 MG/ML  SOLN
100.0000 mL | Freq: Once | INTRAMUSCULAR | Status: AC | PRN
  Administered 2013-11-21: 100 mL via INTRAVENOUS

## 2013-11-21 MED ORDER — FUROSEMIDE 10 MG/ML IJ SOLN
40.0000 mg | Freq: Once | INTRAMUSCULAR | Status: AC
  Administered 2013-11-21: 40 mg via INTRAVENOUS
  Filled 2013-11-21: qty 4

## 2013-11-21 MED ORDER — MORPHINE SULFATE 2 MG/ML IJ SOLN
2.0000 mg | INTRAMUSCULAR | Status: DC | PRN
  Administered 2013-11-21: 2 mg via INTRAVENOUS
  Administered 2013-11-21: 4 mg via INTRAVENOUS
  Filled 2013-11-21: qty 2
  Filled 2013-11-21: qty 1

## 2013-11-21 MED ORDER — HYDROCORTISONE 20 MG PO TABS
30.0000 mg | ORAL_TABLET | Freq: Every day | ORAL | Status: DC
  Administered 2013-11-21: 30 mg via ORAL
  Filled 2013-11-21 (×2): qty 1

## 2013-11-21 MED ORDER — POTASSIUM CHLORIDE CRYS ER 20 MEQ PO TBCR
40.0000 meq | EXTENDED_RELEASE_TABLET | Freq: Two times a day (BID) | ORAL | Status: DC
Start: 2013-11-21 — End: 2013-11-22
  Administered 2013-11-21: 40 meq via ORAL
  Filled 2013-11-21 (×2): qty 2

## 2013-11-21 MED ORDER — ACETAMINOPHEN 325 MG PO TABS
650.0000 mg | ORAL_TABLET | Freq: Once | ORAL | Status: AC
  Administered 2013-11-21: 650 mg via ORAL
  Filled 2013-11-21: qty 2

## 2013-11-21 NOTE — Progress Notes (Signed)
Patient's CT scan noted.  Call placed to Dr. Tyrell Antonio, and Dr. Marin Olp.  Patient to be getting unit of platelets, Lovenox on hold, and labs to be sent.  Will continue to monitor.  Durwin Nora

## 2013-11-21 NOTE — Progress Notes (Signed)
[facility] PROGRESS NOTE  Lacey [name] OIN:[id number] DOB: Mar 02, 1956 DOA: 10/30/2013 PCP: Vena Austria, MD  Assessment/Plan: 58 y.o. female with Past medical history of Hodgkin's lymphoma status post chemotherapy in the pas has myelodysplastic syndrome, presents with abdominal pain, nausea, vomiting, hypotension. 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 She was subsequently diagnosed with PNA. She has persistent right upper lobe collapse. She received 14 days of IV antibiotics. I have consulted pulmonary to evaluate for bronchoscopy. Patient now developed new left hemorrhage in the left iliacus/iliopsoas muscles. Lovenox is on hold. Patient to received platelet transfusion today.    1-Health Care Associated PNA; Patient presents with cough, fever. Repeat CT chest 2-17 showed: consolidation in the medial aspect of the right upper lobe. Partial loculation of the right pleural effusion anteriorly is also noted. Appreciate Dr Baxter Flattery help.  Influenza negative.  Aspergillus antigen no detected.  No enough fluid to allow thoracentesis.  Received 14 days of antibiotics.  WBC at 12. Afebrile.  Repeat chest x ray; Cardiomegaly without CHF . Persistent basilar atelectasis and small pleural effusions. Medial right upper lobe focal collapse/consolidation. Ct chest; 2-23; persistent right middle lobe consolidation. Pleural effusion appears to be decreased in size.  I have consulted pulmonologist for evaluation for bronchoscopy.   2-Acute diastolic dysfunction; increase BNP. Urine out put 2-21 at 8 L. Will hold on lasix dose, patient recieved contrast for CT scan.   3-Acute Renal Failure; in setting of hypotension, contrast. Cr peak to 2.0. Improving with IV fluids. Cr has decrease to 1..4. Monitor. She will received IV lasix after blood transfusion.  -Cr stable at 0.9. Monitor on lasix.   4-Adrenal Insufficiency; Adrenal infarct / hemorrhage; Continue with  Hydrocortisone. Change to oral 2-20.   5-Hypotension ? Sepsis ?; Hypotension could be explain by adrenal insufficiency but in setting of PNA, leukocytosis, fever sepsis is higly in the differential.  IV antibiotics.  Pro-calcitoni 175. Urine culture no growth, blood culture; no growth top date. Hypotension resolved.   6-Anemia; History of Hodgkin Lymphoma: Appreciate Dr Jonette Eva assistance. LDH increase to 737.. - Bone Marrow Biopsy: HYPERCELLULAR BONE MARROW FOR AGE WITH DYSPOIETIC CHANGES.  S/P 2 units of PRBC 2-20. Hb at 9.   7-Right upper extremity superficial thrombosis;Swelling decreasing. Lovenox on hold due to new left hemorrhage in the left iliacus/iliopsoas muscles. 8-Hypokalemia; replete with 40 meq times 2. Repeat b-met in am.  9-Left hip pain; new left hemorrhage in the left iliacus/iliopsoas muscles.  lidocaine patch.   Skin eruptions  -Resolving. Etiology unclear however if infectious should be covered by broad-spectrum antibiotic  Mouth Ulcer;  Herpes virus culture no herpes detected. Received acyclovir for 3 days.   Soft tissue stranding in the anterior mediastinum is of uncertain etiology and significance. If there been recent attempted central line placement, this could be iatrogenic. Less likely, this might be infectious or inflammatory, and clinical correlation for signs and symptoms of mediastinitis may be warranted Unclear etiology.   Code Status: Full Code.  Family Communication: care discussed with patient.  Disposition Plan: PT consulted.    Consultants: Dr. Burney Gauze (oncology)  Dr Gibson Ramp (Surgery) curbside consult  Dr. Carlyle Basques (infectious disease)   Procedures: 11/12/2013 right upper extremity Doppler  No evidence of deep vein thrombosis involving the right upper extremity, left subclavian vein, and left internal jugular vein. There is superficial thrombosis noted in the right cephalic vein.   -7/09 Bone marrow biopsy report pending   Echocardiogram 01/10/2013  - Left  ventricle: mild focal basal hypertrophy of the septum.  -LVEF= ejection fraction was 55%. Wall motion was normal; there were no regional wall motion abnormalities.  -(grade 1 diastolic dysfunction).  - Aortic valve: There was no stenosis. Trivial regurgitation. - Mitral valve: Trivial regurgitation. - Left atrium: The atrium was mildly dilated. - Right ventricle: The cavity size was normal. Systolic function was normal. - Tricuspid valve: Peak RV-RA gradient: 39m Hg (S). - Pulmonary arteries: PA peak pressure: 255mHg (S). - Inferior vena cava: The vessel was normal in size; the respirophasic diameter changes were in the normal range (=50%); findings are consistent with normal central venous pressure.    Antibiotics: Aztreonam 2/11>> stopped 2/13  Ciprofloxacin 2/11>> stopped 2/15  Vancomycin 2/11>> stopped 2/15  Primaxin>> 2/16>>  Voriconazole 2/16>> stopped 2/18 Vancomycin 2/17>>   HPI/Subjective: Feeling better. No SOB, cough improved.   Objective: Filed Vitals:   11/21/13 1442  BP: 114/66  Pulse: 109  Temp: 98.8 F (37.1 C)  Resp: 16    Intake/Output Summary (Last 24 hours) at 11/21/13 1503 Last data filed at 11/21/13 1056  Gross per 24 hour  Intake   1320 ml  Output   2550 ml  Net  -1230 ml   Filed Weights   11/17/13 0400 11/19/13 0528 11/20/13 0522  Weight: 102.7 kg (226 lb 6.6 oz) 97.977 kg (216 lb) 96.435 kg (212 lb 9.6 oz)    Exam:   General:  No distress.   Cardiovascular: S 1, S 2 RRR  Respiratory: decrease breath sounds on the right, no wheezes, no crackles.   Abdomen: Bs present, soft, NT.  Musculoskeletal: right arm with swelling , redness,   Data Reviewed: Basic Metabolic Panel:  Recent Labs Lab 11/17/13 0405 11/18/13 0549 11/18/13 1720 11/19/13 0400 11/20/13 0515 11/21/13 0330  NA 143 145 146 147 144 140  K 3.4* 2.4* 2.8* 3.1* 3.1* 3.4*  CL 111 107 110 107 103 99  CO2 _0 32 31   GLUCOSE 152* 122* 133* 140* 133* 163*  BUN 28* 31* 28* 28* 21 19  CREATININE 1.49* 1.45* 1.24* 1.15* 0.99 1.00  CALCIUM 8.4 8.4 8.1* 8.1* 7.7* 7.9*  MG 1.9 1.8  --  1.7 1.5 1.5   Liver Function Tests:  Recent Labs Lab 11/17/13 0405 11/18/13 0549 11/19/13 0400 11/20/13 0515 11/21/13 0330  AST _1 ALT _2 ALKPHOS 68 68 65 63 71  BILITOT 1.0 1.4* 1.3* 1.2 1.1  PROT 6.4 6.0 5.9* 5.5* 5.8*  ALBUMIN 2.1* 2.1* 2.2* 2.2* 2.3*   No results found for this basename: LIPASE, AMYLASE,  in the last 168 hours No results found for this basename: AMMONIA,  in the last 168 hours CBC:  Recent Labs Lab 11/17/13 0405 11/17/13 2305 11/18/13 0549 11/19/13 0400 11/20/13 0515 11/21/13 0330  WBC 25.9*  --  22.3* 18.8* 13.1* 12.6*  NEUTROABS 14.8*  --  15.8* 12.2* 9.5* 8.1*  HGB 6.8* 9.4* 9.1* 9.1* 8.1* 7.5*  HCT 21.2* 28.2* 27.5* 26.6* 24.7* 22.8*  MCV 96.4  --  90.8 89.9 90.8 92.3  PLT 93*  --  84* 59* 45* 32*   Cardiac Enzymes:  Recent Labs Lab 11/14/13 1635 11/14/13 2140 11/18/13 0917  CKTOTAL  --   --  56  CKMB  --   --  1.0  TROPONINI <0.30 <0.30 <0.30   BNP (last 3 results)  Recent Labs  11/14/13 0435 11/17/13 0800 11/20/13 0515  PROBNP 2977.0* 9541.0* 3178.0*   CBG: No results found for this basename: GLUCAP,  in the last 168 hours  Recent Results (from the past 240 hour(s))  CULTURE, BLOOD (ROUTINE X 2)     Status: None   Collection Time    11/13/13  6:20 AM      Result Value Ref Range Status   Specimen Description BLOOD LEFT HAND   Final   Special Requests     Final   Value: BOTTLES DRAWN AEROBIC AND ANAEROBIC [place] AER 3CC ANA   Culture  Setup Time     Final   Value: 11/13/2013 08:35     Performed at [facility]   Culture     Final   Value: NO GROWTH 5 DAYS     Performed at [facility]   Report Status 11/19/2013 FINAL   Final  CULTURE, BLOOD (ROUTINE X 2)     Status: None   Collection Time    11/13/13  6:35 AM       Result Value Ref Range Status   Specimen Description BLOOD LEFT HAND   Final   Special Requests     Final   Value: BOTTLES DRAWN AEROBIC AND ANAEROBIC [place] AER 3CC ANA   Culture  Setup Time     Final   Value: 11/13/2013 08:35     Performed at [facility]   Culture     Final   Value: NO GROWTH 5 DAYS     Performed at [facility]   Report Status 11/19/2013 FINAL   Final  MRSA PCR SCREENING     Status: None   Collection Time    11/13/13  6:04 PM      Result Value Ref Range Status   MRSA by PCR NEGATIVE  NEGATIVE Final   Comment:            The GeneXpert MRSA Assay (FDA     approved for NASAL specimens     only), is one component of a     comprehensive MRSA colonization     surveillance program. It is not     intended to diagnose MRSA     infection nor to guide or     monitor treatment for     MRSA infections.  URINE CULTURE     Status: None   Collection Time    11/13/13  8:55 PM      Result Value Ref Range Status   Specimen Description URINE, CATHETERIZED   Final   Special Requests NONE   Final   Culture  Setup Time     Final   Value: 11/14/2013 01:07     Performed at [facility] Count     Final   Value: NO GROWTH     Performed at [facility]   Culture     Final   Value: NO GROWTH     Performed at [facility]   Report Status 11/14/2013 FINAL   Final  CULTURE, EXPECTORATED SPUTUM-ASSESSMENT     Status: None   Collection Time    11/14/13  4:36 PM      Result Value Ref Range Status   Specimen Description SPUTUM   Final   Special Requests Immunocompromised   Final   Sputum evaluation     Final   Value: THIS SPECIMEN IS ACCEPTABLE. RESPIRATORY CULTURE REPORT TO FOLLOW.   Report Status 11/14/2013 FINAL   Final  CULTURE, RESPIRATORY (NON-EXPECTORATED)     Status: None   Collection Time    11/14/13  4:36 PM      Result Value Ref Range Status   Specimen Description SPUTUM   Final   Special Requests NONE   Final    Gram Stain     Final   Value: RARE WBC PRESENT, PREDOMINANTLY PMN     RARE SQUAMOUS EPITHELIAL CELLS PRESENT     NO ORGANISMS SEEN     Performed at [facility]   Culture     Final   Value: NORMAL OROPHARYNGEAL FLORA     Performed at [facility]   Report Status 11/17/2013 FINAL   Final  HERPES SIMPLEX VIRUS CULTURE     Status: None   Collection Time    11/14/13  6:49 PM      Result Value Ref Range Status   Specimen Description MOUTH   Final   Special Requests Immunocompromised   Final   Culture     Final   Value: No Herpes Simplex Virus detected.     Performed at [facility]   Report Status 11/16/2013 FINAL   Final     Studies: Ct Chest Wo Contrast  11/20/2013   CLINICAL DATA:  Right upper lobe consolidation. Hodgkin lymphoma. Chemotherapy complete.  EXAM: CT CHEST WITHOUT CONTRAST  TECHNIQUE: Multidetector CT imaging of the chest was performed following the standard protocol without IV contrast.  COMPARISON:  DG CHEST 2 VIEW dated 11/18/2013; CT CHEST W/O CM dated 11/14/2013; CT ANGIO CHEST W/CM &/OR WO/CM dated 11/11/2013; CT ABD/PELVIS W CM dated 11/13/2013; NM PET IMAGE RESTAG (PS) SKULL BASE TO THIGH dated 11/12/2008  FINDINGS: Mediastinal lymph nodes are not enlarged by CT size criteria. Hilar regions are difficult to definitively evaluate without IV contrast. Axillary lymph nodes measure up to on the right. Vascular collaterals are seen along the subcutaneous anterior right chest. Left PICC terminates at the SVC RA junction. Heart size normal. No pericardial effusion. Slight stranding in the prevascular space is again noted.  Collapse/consolidation appears to primarily involve the right middle lobe and is unchanged from 11/14/2013 but is new from 11/11/2013, at which time only patchy scarring was seen in the right upper lobe. Scattered patchy airspace opacities in the right upper and right lower lobes appear peribronchovascular nodularity and progressive from  11/14/2013. New peribronchovascular airspace disease in the left upper lobe (example image 20). Small right pleural effusion may have decreased minimally in size in the interval. Left pleural effusion is small in size and decreased from 11/14/2013. Minimal compressive atelectasis in the left lower lobe. There is apparent narrowing of the right middle lobe bronchus. Airway is otherwise unremarkable.  Incidental imaging of the upper abdomen several low-attenuation lesions in the liver, measuring up to 2.4 cm in the right hepatic lobe. These are grossly unchanged from 11/05/2013. Thickening and enlargement of both adrenal glands, as before. Visualized portions of the liver, gallbladder, spleen, pancreas, stomach and bowel are otherwise unremarkable. No upper abdominal adenopathy or free fluid. No worrisome lytic or sclerotic lesions.  IMPRESSION: 1. Persistent collapse/consolidation appears to primarily involve the right middle lobe and is likely due to pneumonia. Followup to clearing is recommended to exclude a centrally obstructing lesion. 2. New peribronchovascular airspace opacities in the right upper and right lower lobes, most indicative of bronchopneumonia. 3. Small right pleural effusion, possibly minimally decreased from prior. Small left pleural effusion, decreased from prior. 4. Bilateral adrenal thickening/enlargement, as before,  and possibly indicative of adrenal hemorrhage.   Electronically Signed   By: Lorin Picket M.D.   On: 11/20/2013 10:30   Ct Abdomen Pelvis W Contrast  11/21/2013   ADDENDUM REPORT: 11/21/2013 12:31  ADDENDUM: This is for the purpose of correcting the second paragraph in the findings which refers to the right iliacus muscle. The hemorrhage is in the patient's left iliacus muscle. The right iliacus appears normal.   Electronically Signed   By: Inge Rise M.D.   On: 11/21/2013 12:31   11/21/2013   CLINICAL DATA:  Lower left abdominal pain. Left hip pain. History of  Hodgkin's disease. Question hemorrhage.  EXAM: CT ABDOMEN AND PELVIS WITH CONTRAST  TECHNIQUE: Multidetector CT imaging of the abdomen and pelvis was performed using the standard protocol following bolus administration of intravenous contrast.  CONTRAST:  100 mL OMNIPAQUE IOHEXOL 300 MG/ML  SOLN  COMPARISON:  CT abdomen and pelvis 11/03/2013. CT abdomen 11/13/2013.  FINDINGS: Small bilateral pleural effusions are are again seen. The patient's right pleural effusion has slightly increased since the most recent CT. Mild dependent atelectasis is seen lung bases. There is no pericardial effusion.  Since the 10/2008/2015 study, the patient has suffered hemorrhage in the right iliacus muscle with fluid fluid levels identified and the muscle enlarged. Discrete measurement is difficult but the main area of involvement measures approximately 4.8 cm AP x 3.9 cm transverse by 6.0 cm craniocaudal. Hemorrhage extends into the left iliopsoas muscle anterior to the left hip. Stranding is seen in the left pericolic gutter compatible with hemorrhage.  Scattered cysts in the liver are unchanged. The spleen and pancreas are unremarkable. Scarring is identified in the upper pole of the left kidney. The kidneys are otherwise unremarkable.  As seen on the most recent CT scan, the left adrenal gland is indistinct and enlarged. The appearance is not slightly improved. Enlargement of the right adrenal gland is also again seen but appears improved. Stranding about the right adrenal gland shows marked improvement since the 10/2008/2015 examination.  A Foley catheter is in place in a decompressed urinary bladder. Uterus and adnexa are unremarkable. The stomach and small and large bowel appear normal. No lymphadenopathy is identified. No focal bony abnormality is identified.  IMPRESSION: Since the 11/01/2013 examination, the patient has suffered hemorrhage in the left iliacus/iliopsoas muscles. Stranding in the left pericolic gutter is seen  compatible with mild hemorrhage.  Bilateral adrenal hemorrhage seen on prior studies is resolving.  Small bilateral pleural effusions, larger on the right. The right pleural effusion is increased slightly since the prior examination.  Electronically Signed: By: Inge Rise M.D. On: 11/21/2013 09:23   Ct Hip Left Wo Contrast  11/20/2013   CLINICAL DATA:  Severe left hip pain.  Hemorrhage.  EXAM: CT OF THE LEFT HIP WITHOUT CONTRAST  TECHNIQUE: Multidetector CT imaging was performed according to the standard protocol. Multiplanar CT image reconstructions were also generated.  COMPARISON:  None.  FINDINGS: Left acetabulum and left femoral head are within normal limits. No gross hip effusion is present. There is no fracture. Stranding is present in the proximal left thigh, partially visualized. This appears prominent anteriorly. There is stranding in the fat along the left external iliac vessels which may represent a small amount of hemorrhage or edema. No intramuscular hematomas identified. The obturator rings are intact. Gluteal muscles appear normal.  IMPRESSION: Nonspecific stranding in the anterior proximal left thigh, anterior to the left hip and along the left external iliac vessels. No hematoma.  The stranding may represent edema, cellulitis or hemorrhage if prior instrumentation.   Electronically Signed   By: Dereck Ligas M.D.   On: 11/20/2013 10:28    Scheduled Meds: . DULoxetine  60 mg Oral QPM  . fluconazole  100 mg Oral Daily  . fluticasone  2 spray Each Nare Daily  . gabapentin  600 mg Oral BID  . hydrocortisone  30 mg Oral Daily  . lidocaine  1 patch Transdermal Q24H  . magic mouthwash w/lidocaine  10 mL Oral Q4H  . pantoprazole  40 mg Oral BID  . sodium chloride  10-40 mL Intracatheter Q12H  . sodium chloride  3 mL Intravenous Q12H   Continuous Infusions: . sodium chloride 1,000 mL (11/19/13 1437)    Principal Problem:   PNA (pneumonia) Active Problems:   Hodgkin lymphoma    Myelodysplasia   Adrenal abnormality   Rash and nonspecific skin eruption   DOE (dyspnea on exertion)   Symptomatic anemia   Melena   Adrenal infarction   Hypotension, unspecified   Sepsis   Acute renal failure    Time spent: 30 minutes.     Amar Keenum  [facility] Pager [phone]. If 7PM-7AM, please contact night-coverage at [email or web address], password [facility] 11/21/2013, 3:03 PM  LOS: 14 days

## 2013-11-21 NOTE — Progress Notes (Signed)
She is having worsening pain in the left hip. With his CT scan yesterday, there is no obvious fracture. There may have been some bleeding. With her being on Lovenox and with her having low platelets, I would be worried about bleeding. I will stop Lovenox. I will give her platelets. I feel we have to repeat the scan with contrast. Her renal function is good. She had out over 4000 cc of urine yesterday.  I still can't understand the CT of the chest. I don't know if she has some kind of obstruction that could be causing right upper lung collapse.   I think that would probably will need pulmonary to see if she needs a bronchoscopy. Again I hate to be invasive but I cannot explain these CT findings.  She certainly does not have any pulmonary symptoms right now. She's not on oxygen. Her oxygen saturation is in the mid 90s.  She had an episode of emesis yesterday.  She is just miserable because of the left hip now. Again, we need to make sure she is not bleeding into the left hip.  Is no left leg weakness. I tested this specifically this morning  Her white cell count continues to trend downward. Her white cell count is 12.6. Hemoglobin 7.5. Platelet count is 32. Her LDH is a 39.  Her potassium is better. Her blood pressure continues to stabilize. I will to see if about tapering down the hydrocortisone.  The still some swelling in the right arm. I realize that she has a thrombus in the superficial vein. Unfortunately, I think that anticoagulation right now with her low platelets might be a little bit too much for her.  When I look to the left hip. I saw no ecchymoses. It was not warm. There is no erythema on the skin. There was some tenderness to palpation. Her abdomen was slightly tender in the left lower quadrant. Bowel sounds were active. There is no palpable hepatosplenomegaly. Lungs sounded clear. No wheezes were noted. Cardiac exam is tachycardic but regular.  Again, we just have other issues  that we now need to deal with. She just is not ready to go home.  I think we have to try to focus on the left hip issues now.  I did speak with her husband yesterday.  Joylene John 6:9

## 2013-11-21 NOTE — Progress Notes (Signed)
CSW met with patient to discuss bed offers. Patient is alert and oriented X3. Gave offers. She is agreeable to [facility]. CSW sent information in to begin auth process.   C.  MSW, LCSW [phone]  

## 2013-11-22 ENCOUNTER — Inpatient Hospital Stay (HOSPITAL_COMMUNITY): Payer: BC Managed Care – PPO

## 2013-11-22 DIAGNOSIS — R918 Other nonspecific abnormal finding of lung field: Secondary | ICD-10-CM

## 2013-11-22 DIAGNOSIS — M7981 Nontraumatic hematoma of soft tissue: Secondary | ICD-10-CM

## 2013-11-22 DIAGNOSIS — R509 Fever, unspecified: Secondary | ICD-10-CM

## 2013-11-22 LAB — MAGNESIUM: Magnesium: 1.5 mg/dL (ref 1.5–2.5)

## 2013-11-22 LAB — CBC WITH DIFFERENTIAL/PLATELET
Band Neutrophils: 0 % (ref 0–10)
Basophils Absolute: 0 10*3/uL (ref 0.0–0.1)
Basophils Relative: 0 % (ref 0–1)
Blasts: 0 %
EOS ABS: 0.1 10*3/uL (ref 0.0–0.7)
Eosinophils Relative: 1 % (ref 0–5)
HCT: 19.7 % — ABNORMAL LOW (ref 36.0–46.0)
HEMOGLOBIN: 6.3 g/dL — AB (ref 12.0–15.0)
Lymphocytes Relative: 25 % (ref 12–46)
Lymphs Abs: 3.4 10*3/uL (ref 0.7–4.0)
MCH: 30.1 pg (ref 26.0–34.0)
MCHC: 32 g/dL (ref 30.0–36.0)
MCV: 94.3 fL (ref 78.0–100.0)
MONOS PCT: 5 % (ref 3–12)
Metamyelocytes Relative: 2 %
Monocytes Absolute: 0.7 10*3/uL (ref 0.1–1.0)
Myelocytes: 1 %
NEUTROS ABS: 9.3 10*3/uL — AB (ref 1.7–7.7)
NRBC: 4 /100{WBCs} — AB
Neutrophils Relative %: 66 % (ref 43–77)
PLATELETS: 32 10*3/uL — AB (ref 150–400)
Promyelocytes Absolute: 0 %
RBC: 2.09 MIL/uL — AB (ref 3.87–5.11)
RDW: 20 % — ABNORMAL HIGH (ref 11.5–15.5)
WBC: 13.5 10*3/uL — ABNORMAL HIGH (ref 4.0–10.5)

## 2013-11-22 LAB — URINE MICROSCOPIC-ADD ON

## 2013-11-22 LAB — PREPARE PLATELET PHERESIS: UNIT DIVISION: 0

## 2013-11-22 LAB — URINALYSIS, ROUTINE W REFLEX MICROSCOPIC
BILIRUBIN URINE: NEGATIVE
Glucose, UA: NEGATIVE mg/dL
Ketones, ur: NEGATIVE mg/dL
LEUKOCYTES UA: NEGATIVE
NITRITE: NEGATIVE
Protein, ur: 100 mg/dL — AB
Specific Gravity, Urine: 1.029 (ref 1.005–1.030)
UROBILINOGEN UA: 1 mg/dL (ref 0.0–1.0)
pH: 7.5 (ref 5.0–8.0)

## 2013-11-22 LAB — COMPREHENSIVE METABOLIC PANEL
ALT: 15 U/L (ref 0–35)
AST: 26 U/L (ref 0–37)
Albumin: 2.1 g/dL — ABNORMAL LOW (ref 3.5–5.2)
Alkaline Phosphatase: 57 U/L (ref 39–117)
BUN: 22 mg/dL (ref 6–23)
CALCIUM: 7.9 mg/dL — AB (ref 8.4–10.5)
CO2: 33 mEq/L — ABNORMAL HIGH (ref 19–32)
CREATININE: 1.55 mg/dL — AB (ref 0.50–1.10)
Chloride: 99 mEq/L (ref 96–112)
GFR calc Af Amer: 42 mL/min — ABNORMAL LOW (ref >90)
GFR calc non Af Amer: 36 mL/min — ABNORMAL LOW (ref >90)
GLUCOSE: 109 mg/dL — AB (ref 70–99)
Potassium: 3.2 mEq/L — ABNORMAL LOW (ref 3.7–5.3)
Sodium: 142 mEq/L (ref 137–147)
TOTAL PROTEIN: 5.4 g/dL — AB (ref 6.0–8.3)
Total Bilirubin: 0.9 mg/dL (ref 0.3–1.2)

## 2013-11-22 LAB — BASIC METABOLIC PANEL
BUN: 29 mg/dL — ABNORMAL HIGH (ref 6–23)
CHLORIDE: 102 meq/L (ref 96–112)
CO2: 29 meq/L (ref 19–32)
CREATININE: 2.31 mg/dL — AB (ref 0.50–1.10)
Calcium: 7.7 mg/dL — ABNORMAL LOW (ref 8.4–10.5)
GFR calc Af Amer: 25 mL/min — ABNORMAL LOW (ref >90)
GFR calc non Af Amer: 22 mL/min — ABNORMAL LOW (ref >90)
GLUCOSE: 84 mg/dL (ref 70–99)
Potassium: 3.7 mEq/L (ref 3.7–5.3)
SODIUM: 142 meq/L (ref 137–147)

## 2013-11-22 LAB — AMMONIA: Ammonia: 19 umol/L (ref 11–60)

## 2013-11-22 LAB — PREPARE RBC (CROSSMATCH)

## 2013-11-22 LAB — TROPONIN I: Troponin I: <0.3 ng/mL (ref <0.30)

## 2013-11-22 LAB — RETICULOCYTES
RBC.: 2.09 MIL/uL — AB (ref 3.87–5.11)
RETIC COUNT ABSOLUTE: 14.6 10*3/uL — AB (ref 19.0–186.0)
RETIC CT PCT: 0.7 % (ref 0.4–3.1)

## 2013-11-22 LAB — LACTIC ACID, PLASMA: Lactic Acid, Venous: 2.3 mmol/L — ABNORMAL HIGH (ref 0.5–2.2)

## 2013-11-22 LAB — LACTATE DEHYDROGENASE: LDH: 633 U/L — AB (ref 94–250)

## 2013-11-22 MED ORDER — TRAMADOL HCL 50 MG PO TABS
100.0000 mg | ORAL_TABLET | Freq: Four times a day (QID) | ORAL | Status: DC | PRN
  Administered 2013-11-22: 100 mg via ORAL
  Filled 2013-11-22: qty 2

## 2013-11-22 MED ORDER — DEXTROSE 5 % IV SOLN
1.0000 g | INTRAVENOUS | Status: DC
  Administered 2013-11-23 – 2013-11-24 (×2): 1 g via INTRAVENOUS
  Filled 2013-11-22 (×3): qty 1

## 2013-11-22 MED ORDER — TRAMADOL HCL 50 MG PO TABS
100.0000 mg | ORAL_TABLET | Freq: Two times a day (BID) | ORAL | Status: DC | PRN
  Administered 2013-11-23 – 2013-11-24 (×2): 100 mg via ORAL
  Filled 2013-11-22 (×2): qty 2

## 2013-11-22 MED ORDER — ENSURE COMPLETE PO LIQD
237.0000 mL | Freq: Three times a day (TID) | ORAL | Status: DC
  Administered 2013-11-22 – 2013-12-02 (×24): 237 mL via ORAL

## 2013-11-22 MED ORDER — SODIUM CHLORIDE 0.9 % IV BOLUS (SEPSIS)
1000.0000 mL | Freq: Once | INTRAVENOUS | Status: AC
  Administered 2013-11-22: 1000 mL via INTRAVENOUS

## 2013-11-22 MED ORDER — HYDROCORTISONE NA SUCCINATE PF 100 MG IJ SOLR
100.0000 mg | Freq: Two times a day (BID) | INTRAMUSCULAR | Status: DC
  Administered 2013-11-22: 100 mg via INTRAVENOUS
  Filled 2013-11-22 (×2): qty 2

## 2013-11-22 MED ORDER — ACETAMINOPHEN 500 MG PO TABS
1000.0000 mg | ORAL_TABLET | Freq: Once | ORAL | Status: AC
  Administered 2013-11-22: 1000 mg via ORAL
  Filled 2013-11-22: qty 2

## 2013-11-22 MED ORDER — ALTEPLASE 100 MG IV SOLR: 2.0000 mg | Freq: Once | INTRAVENOUS | Status: DC

## 2013-11-22 MED ORDER — SODIUM CHLORIDE 0.9 % IV BOLUS (SEPSIS)
500.0000 mL | Freq: Once | INTRAVENOUS | Status: AC
  Administered 2013-11-22: 500 mL via INTRAVENOUS

## 2013-11-22 MED ORDER — ACYCLOVIR 400 MG PO TABS
800.0000 mg | ORAL_TABLET | Freq: Three times a day (TID) | ORAL | Status: DC
  Filled 2013-11-22 (×3): qty 2

## 2013-11-22 MED ORDER — HYDROCORTISONE NA SUCCINATE PF 100 MG IJ SOLR
50.0000 mg | Freq: Four times a day (QID) | INTRAMUSCULAR | Status: DC
  Administered 2013-11-23 – 2013-11-25 (×10): 50 mg via INTRAVENOUS
  Filled 2013-11-22 (×4): qty 1
  Filled 2013-11-22: qty 2
  Filled 2013-11-22 (×2): qty 1
  Filled 2013-11-22: qty 2
  Filled 2013-11-22 (×8): qty 1

## 2013-11-22 MED ORDER — BIOTENE DRY MOUTH MT LIQD
15.0000 mL | Freq: Two times a day (BID) | OROMUCOSAL | Status: DC
  Administered 2013-11-23 – 2013-12-02 (×16): 15 mL via OROMUCOSAL

## 2013-11-22 MED ORDER — ALTEPLASE 2 MG IJ SOLR
2.0000 mg | Freq: Once | INTRAMUSCULAR | Status: AC
  Administered 2013-11-22: 2 mg
  Filled 2013-11-22: qty 2

## 2013-11-22 MED ORDER — FUROSEMIDE 10 MG/ML IJ SOLN
60.0000 mg | Freq: Once | INTRAMUSCULAR | Status: AC
  Administered 2013-11-22: 60 mg via INTRAVENOUS
  Filled 2013-11-22: qty 6

## 2013-11-22 MED ORDER — CHLORHEXIDINE GLUCONATE 0.12 % MT SOLN
15.0000 mL | Freq: Two times a day (BID) | OROMUCOSAL | Status: DC
Start: 2013-11-22 — End: 2013-12-02
  Administered 2013-11-23 – 2013-12-02 (×18): 15 mL via OROMUCOSAL
  Filled 2013-11-22 (×22): qty 15

## 2013-11-22 MED ORDER — ACYCLOVIR 200 MG PO CAPS
800.0000 mg | ORAL_CAPSULE | Freq: Three times a day (TID) | ORAL | Status: DC
  Filled 2013-11-22 (×3): qty 4

## 2013-11-22 MED ORDER — DEXTROSE 5 % IV SOLN
2.0000 g | INTRAVENOUS | Status: DC
  Administered 2013-11-22: 2 g via INTRAVENOUS
  Filled 2013-11-22: qty 2

## 2013-11-22 MED ORDER — VANCOMYCIN HCL 10 G IV SOLR
1250.0000 mg | INTRAVENOUS | Status: DC
  Filled 2013-11-22: qty 1250

## 2013-11-22 NOTE — Consult Note (Signed)
PULMONARY/CCM CONSULT NOTE  Requesting MD/Service: [facility] Date of admission: 2/10 Date of consult: 2/25 Reason for consultation: Abnormal CXR/CT chest. Consider FOB  Pt Profile: 35 F with hx of Hodgkin's lymphoma and MDS admitted 2/10 with fever, N/V and abdominal pain. Her admitting dx was pyelonephritis. Her hospitalization has been lengthy and complicated. She has had intermittent low grade fevers, leukocytosis and persistent R suprahilar density on CXR. A CT chest 2/23 is discussed below and the CT findings have prompted PCCM consult to consider FOB.    HPI:  As above. At the time of my evaluation, pt was too lethargic to provide much further history. Her HCPOA was present.  Past Medical History  Diagnosis Date  . Cancer   . Cough productive of clear sputum 01/04/2013  . Fibromyalgia     MEDICATIONS: reviewed  History   Social History  . Marital Status: Married    Spouse Name: N/A    Number of Children: N/A  . Years of Education: N/A   Occupational History  . Not on file.   Social History Main Topics  . Smoking status: Never Smoker   . Smokeless tobacco: Never Used  . Alcohol Use: Yes     Comment: occasionally  . Drug Use: No  . Sexual Activity: Not on file   Other Topics Concern  . Not on file   Social History Narrative  . No narrative on file    Family History  Problem Relation Age of Onset  . Cancer Mother   . Cancer Father     ROS - N/A  Filed Vitals:   11/22/13 1526 11/22/13 1610 11/22/13 1710 11/22/13 1810  BP: 94/57 85/64 91/64  97/61  Pulse: 123 121 122 120  Temp: 99.5 F (37.5 C) 99.2 F (37.3 C) 99.5 F (37.5 C) 99.8 F (37.7 C)  TempSrc: Oral Oral Oral Oral  Resp: 18 18 18    Height:      Weight:      SpO2: 95% 96%  97%    EXAM:  Gen: lethargic, NAD HEENT: WNL, PERRL, EOMI Neck: no JVD Lungs: clear anteriorly without wheezes Cardiovascular: RRR s M Abdomen: obese, soft, NT, +BS Warm: no C/C/E Neuro: no focal  deficit  DATA:  I have reviewed all of today's lab results. Relevant abnormalities are discussed in the A/P section  CXR: R suprahilar density  - triangular in shape with appearance of atelectasis  CT chest: IMPRESSION:  1. Persistent collapse/consolidation appears to primarily involve  the right middle lobe and is likely due to pneumonia. Followup to  clearing is recommended to exclude a centrally obstructing lesion.  2. New peribronchovascular airspace opacities in the right upper and  right lower lobes, most indicative of bronchopneumonia.  3. Small right pleural effusion, possibly minimally decreased from  prior. Small left pleural effusion, decreased from prior.  4. Bilateral adrenal thickening/enlargement, as before, and possibly  indicative of adrenal hemorrhage.   IMPRESSION:   Intermittent fever, leukocytosis and persistent RML density - atelectasis and/or infiltrate.  PLAN:  FOB 2/26 for airway exam and BAL of medial segment of RML. EBBX if endobronchial disease noted   Merton Border, MD ; [facility] service Mobile [phone].  After 5:30 PM or weekends, call [phone]

## 2013-11-22 NOTE — Progress Notes (Signed)
PT Cancellation Note  Patient Details Name: Lacey [name] MRN: [id number] DOB: [birth year]   Cancelled Treatment:    Reason Eval/Treat Not Completed: Medical issues which prohibited therapy (Hbg 6.3. ) Will follow.   Blondell Reveal Kistler 11/22/2013, 10:38 AM [phone]

## 2013-11-22 NOTE — Progress Notes (Signed)
Patient's blood pressure 86/54, and looks to have increased swelling in upper extremities.  Dr. Sheran Fava notified, with orders received.  Durwin Nora RN

## 2013-11-22 NOTE — Progress Notes (Addendum)
[facility] PROGRESS NOTE  Lacey [name] GBT:[id number] DOB: [birth year] DOA: 11/08/2013 PCP: Vena Austria, MD  Assessment/Plan  CV: Hypotension and tachycardia:  ddx includes developing sepsis, adrenal insufficiency.  Pt on fluconazole, but fungal infection still possible, PE, autoimmune disorder.  Steroids were recently tapered and antibiotics were discontinued in the last few days.  Only foreign body is C4-5 spine surgery.  Consider infected hematoma. -  NS bolus >> blood pressure improved with IVF -  Continue IVF -  Lactic acid 2.3, trop neg -  Okay to increase to stress dose steroids until hypotension is resolved -  Tx for sepsis as below  -  if worsening signs of sepsis, will need to d/c PICC -  May need central line if PICC needs to be removed -  PCCM consulted  -  Transfer to stepdown for closer monitoring  Chronic diastolic heart failure.  Wt down 5kg in last 3 days.  Preserved EF and grade 1 diastolic heart failure on echocardiogram from a few days ago. -  Transient hypotension after lasix this AM   -  Hold further diuresis at this time - Electrolytes suggest that she is prerenal  Adrenal Insufficiency; Adrenal infarct / hemorrhage; Continue with Hydrocortisone. Change to oral 2-20.  -  Restart stress dose steroids  ID:  Recurrent fever -  Appreciate ID assistance -  Repeat BCx -  CXR:  Stable consolidation -  UA:  Neg except for high spec grav -  CT chest on 2/23:  Persistent consolidation RML with new peribronchovascular opacities suggestive of bronchopneumonia.   -  Restart cefepime for possible HAP -  Check vanc level -  Check legionella and s. pneumo PCR -  Pulmonology consulted for possible bronch -  Consider CT angio to eval for PE if renal function improves, however, no A/C at this time seconadry to retroperitoneal bleed.  VQ likely not helpful given PNA and effusion  [facility] PNA;  Consolidation in the medial aspect of the  right upper lobe. Partial loculation of the right pleural effusion anteriorly is also noted.  -  Influenza negative.  -  Aspergillus antigen no detected.  -  No enough fluid to allow thoracentesis.  -  Completed 14 days of antibiotics, last day of vanc was 2/24, last primaxin 2/21  Diarrhea:   -  C. Diff pending  Mouth Ulcer; Herpes virus culture no herpes detected. Received acyclovir for 3 days.  -  D/c acyclovir   Soft tissue stranding in the anterior mediastinum and left thigh is of uncertain etiology and significance. If there been recent attempted central line placement, this could be iatrogenic. Less likely, this might be infectious or inflammatory, and clinical correlation for signs and symptoms of mediastinitis may be warranted  -  Unclear etiology, but not complaining of chest pain  Skin eruptions  -Resolving. Etiology unclear  RENAL: Acute Renal Failure; recurrent, likely secondary to overdiuresis vs. Early c. Diff.   -  Hold further Lasix please  -  FENa ordered and pending -  RUS -  Repeat BMP:  AKI with very low CrCl -  Pharmacy to assist with renally dosing medications -  D/c vanco and check level -  Adjust acyclovir dose -   Minimize nephrotoxins  NEURO Lethargy, difficulty staying awake -  CT head to rule out spontaneous hemorrhage given thrombocytopenia -  Minimize sedating medications -  Monitor for nuchal rigidity (none currently) -  Ammonia level:  19 -  Consider  MRI brain/LP if mentation not improving by holding sedatives  HEME Acute left anterior thigh pain, possible hematoma, vs. Early cellulitis or inflammatory state -  Hold abx -  Ultram as needed for pain -  Lidocaine patch  Spontaneous retroperitoneal bleed in setting of thrombocytopenia -  Transfuse PRBC and plt -  INR 1.27 -  Consider repeat CT abd/pelvis if anemia continues to recur quickly  Mixed acute blood loss anemia plus marrow failure; History of Hodgkin Lymphoma: Appreciate Dr  Jonette Eva assistance. LDH increase to 737.. - Bone Marrow Biopsy: HYPERCELLULAR BONE MARROW FOR AGE WITH DYSPOIETIC CHANGES. S/P 2 units of PRBC 2-20. Hb at 9.  -  Transfuse 2 units PRBC and 1 unit plt  Right upper extremity superficial thrombosis;Swelling decreasing.  -  Lovenox on hold due to new left hemorrhage in the left iliacus/iliopsoas muscles.   Thrombocytopenia:  No schistocytes to suggest DIC or TTP, but concerning given fever, AKI and AMS.  Reassuring that LDH is trending down. -  Repeat CBC with diff and smear in AM  FEN: Hypokalemia;  -  Oral potassium repletion   Diet:  regular Access:  PICC IVF:  foff Proph:  Hold for thrombocytopenia, spont retroperitoneal bleed  Code Status: full Family Communication: patient alone Disposition Plan: pending improvement in fevers and mentation   Consultants:  Dr. Burney Gauze (oncology)  Dr Gibson Ramp (Surgery) curbside consult  Dr. Carlyle Basques (infectious disease)  Procedures:  11/12/2013 right upper extremity Doppler  No evidence of deep vein thrombosis involving the right upper extremity, left subclavian vein, and left internal jugular vein. There is superficial thrombosis noted in the right cephalic vein.  -7/01 Bone marrow biopsy report pending  Echocardiogram 01/10/2013  - Left ventricle: mild focal basal hypertrophy of the septum.  -LVEF= ejection fraction was 55%. Wall motion was normal; there were no regional wall motion abnormalities.  -(grade 1 diastolic dysfunction).  - Aortic valve: There was no stenosis. Trivial regurgitation. - Mitral valve: Trivial regurgitation. - Left atrium: The atrium was mildly dilated. - Right ventricle: The cavity size was normal. Systolic function was normal. - Tricuspid valve: Peak RV-RA gradient: 48m Hg (S). - Pulmonary arteries: PA peak pressure: 240mHg (S). - Inferior vena cava: The vessel was normal in size; the respirophasic diameter changes were in the normal range  (=50%); findings are consistent with normal central venous pressure.  Antibiotics:  Aztreonam 2/11>> stopped 2/13  Ciprofloxacin 2/11>> stopped 2/15  Vancomycin 2/11>> stopped 2/15  Primaxin>> 2/16>> 2/21 Voriconazole 2/16>> stopped 2/18  Vancomycin 2/17>> 2/24  HPI/Subjective:  Patient sleepy during exam.  Complains of pain in anterior left thigh, otherwise denies acute complaints.  Denies cough, soB  Objective: Filed Vitals:   11/22/13 1140 11/22/13 1230 11/22/13 1300 11/22/13 1326  BP: 148/65 104/66 105/67 107/67  Pulse: 121 125 122 122  Temp: 99.1 F (37.3 C) 99.1 F (37.3 C) 98.9 F (37.2 C) 99.7 F (37.6 C)  TempSrc: Oral Oral Oral Oral  Resp: _0 Height:      Weight:      SpO2:        Intake/Output Summary (Last 24 hours) at 11/22/13 1430 Last data filed at 11/22/13 1311  Gross per 24 hour  Intake    920 ml  Output      0 ml  Net    920 ml   Filed Weights   11/19/13 0528 11/20/13 0522 11/22/13 0500  Weight: 97.977 kg (216 lb) 96.435 kg (  212 lb 9.6 oz) 92.9 kg (204 lb 12.9 oz)    Exam:   General:  CF, No acute distress  HEENT:  NCAT, MMM  Cardiovascular:  RRR, nl S1, S2 no mrg, 2+ pulses, warm extremities  Respiratory:  No obvious wheezes, rales or rhonchi, but difficult exam, no increased WOB  Abdomen:   NABS, soft, NT/ND  MSK:   Normal tone and bulk, no LEE  Neuro:  Grossly intact  Data Reviewed: Basic Metabolic Panel:  Recent Labs Lab 11/18/13 0549 11/18/13 1720 11/19/13 0400 11/20/13 0515 11/21/13 0330 11/22/13 0338  NA 145 146 147 144 140 142  K 2.4* 2.8* 3.1* 3.1* 3.4* 3.2*  CL 107 110 107 103 99 99  CO2 _0 32 31 33*  GLUCOSE 122* 133* 140* 133* 163* 109*  BUN 31* 28* 28* _1 CREATININE 1.45* 1.24* 1.15* 0.99 1.00 1.55*  CALCIUM 8.4 8.1* 8.1* 7.7* 7.9* 7.9*  MG 1.8  --  1.7 1.5 1.5 1.5   Liver Function Tests:  Recent Labs Lab 11/18/13 0549 11/19/13 0400 11/20/13 0515 11/21/13 0330 11/22/13 0338   AST _2 ALT _3 ALKPHOS 68 65 63 71 57  BILITOT 1.4* 1.3* 1.2 1.1 0.9  PROT 6.0 5.9* 5.5* 5.8* 5.4*  ALBUMIN 2.1* 2.2* 2.2* 2.3* 2.1*   No results found for this basename: LIPASE, AMYLASE,  in the last 168 hours No results found for this basename: AMMONIA,  in the last 168 hours CBC:  Recent Labs Lab 11/18/13 0549 11/19/13 0400 11/20/13 0515 11/21/13 0330 11/22/13 0338  WBC 22.3* 18.8* 13.1* 12.6* 13.5*  NEUTROABS 15.8* 12.2* 9.5* 8.1* 9.3*  HGB 9.1* 9.1* 8.1* 7.5* 6.3*  HCT 27.5* 26.6* 24.7* 22.8* 19.7*  MCV 90.8 89.9 90.8 92.3 94.3  PLT 84* 59* 45* 32* 32*   Cardiac Enzymes:  Recent Labs Lab 11/18/13 0917  CKTOTAL 56  CKMB 1.0  TROPONINI <0.30   BNP (last 3 results)  Recent Labs  11/14/13 0435 11/17/13 0800 11/20/13 0515  PROBNP 2977.0* 9541.0* 3178.0*   CBG: No results found for this basename: GLUCAP,  in the last 168 hours  Recent Results (from the past 240 hour(s))  CULTURE, BLOOD (ROUTINE X 2)     Status: None   Collection Time    11/13/13  6:20 AM      Result Value Ref Range Status   Specimen Description BLOOD LEFT HAND   Final   Special Requests     Final   Value: BOTTLES DRAWN AEROBIC AND ANAEROBIC [place] A[facility] 3CC ANA   Culture  Setup Time     Final   Value: 11/13/2013 08:35     Performed at [facility]   Culture     Final   Value: NO GROWTH 5 DAYS     Performed at [facility]   Report Status 11/19/2013 FINAL   Final  CULTURE, BLOOD (ROUTINE X 2)     Status: None   Collection Time    11/13/13  6:35 AM      Result Value Ref Range Status   Specimen Description BLOOD LEFT HAND   Final   Special Requests     Final   Value: BOTTLES DRAWN AEROBIC AND ANAEROBIC [place] AER 3CC ANA   Culture  Setup Time     Final   Value: 11/13/2013 08:35     Performed at [facility]  Final   Value: NO GROWTH 5 DAYS     Performed at [facility]   Report Status 11/19/2013 FINAL   Final  MRSA  PCR SCREENING     Status: None   Collection Time    11/13/13  6:04 PM      Result Value Ref Range Status   MRSA by PCR NEGATIVE  NEGATIVE Final   Comment:            The GeneXpert MRSA Assay (FDA     approved for NASAL specimens     only), is one component of a     comprehensive MRSA colonization     surveillance program. It is not     intended to diagnose MRSA     infection nor to guide or     monitor treatment for     MRSA infections.  URINE CULTURE     Status: None   Collection Time    11/13/13  8:55 PM      Result Value Ref Range Status   Specimen Description URINE, CATHETERIZED   Final   Special Requests NONE   Final   Culture  Setup Time     Final   Value: 11/14/2013 01:07     Performed at [facility]     Final   Value: NO GROWTH     Performed at [facility]   Culture     Final   Value: NO GROWTH     Performed at [facility]   Report Status 11/14/2013 FINAL   Final  CULTURE, EXPECTORATED SPUTUM-ASSESSMENT     Status: None   Collection Time    11/14/13  4:36 PM      Result Value Ref Range Status   Specimen Description SPUTUM   Final   Special Requests Immunocompromised   Final   Sputum evaluation     Final   Value: THIS SPECIMEN IS ACCEPTABLE. RESPIRATORY CULTURE REPORT TO FOLLOW.   Report Status 11/14/2013 FINAL   Final  CULTURE, RESPIRATORY (NON-EXPECTORATED)     Status: None   Collection Time    11/14/13  4:36 PM      Result Value Ref Range Status   Specimen Description SPUTUM   Final   Special Requests NONE   Final   Gram Stain     Final   Value: RARE WBC PRESENT, PREDOMINANTLY PMN     RARE SQUAMOUS EPITHELIAL CELLS PRESENT     NO ORGANISMS SEEN     Performed at [facility]   Culture     Final   Value: NORMAL OROPHARYNGEAL FLORA     Performed at [facility]   Report Status 11/17/2013 FINAL   Final  HERPES SIMPLEX VIRUS CULTURE     Status: None   Collection Time    11/14/13  6:49 PM       Result Value Ref Range Status   Specimen Description MOUTH   Final   Special Requests Immunocompromised   Final   Culture     Final   Value: No Herpes Simplex Virus detected.     Performed at [facility]   Report Status 11/16/2013 FINAL   Final     Studies: Dg Chest 2 View  11/22/2013   CLINICAL DATA:  Shortness of breath.  Follow-up atelectasis.  EXAM: CHEST  2 VIEW  COMPARISON:  11/18/2013  FINDINGS: Left upper extremity PICC is stable, tip at the  upper right atrium. No cardiomegaly. Persistent fullness of the right hilum with perihilar opacification, partially triangular. There is elevation of the right fissure. Previous infused CTs without right hilar mass. Small pleural effusions.  IMPRESSION: 1. Unchanged right perihilar atelectasis and consolidation. 2. Small bilateral pleural effusion.   Electronically Signed   By: Jorje Guild M.D.   On: 11/22/2013 10:20   Ct Abdomen Pelvis W Contrast  11/21/2013   ADDENDUM REPORT: 11/21/2013 12:31  ADDENDUM: This is for the purpose of correcting the second paragraph in the findings which refers to the right iliacus muscle. The hemorrhage is in the patient's left iliacus muscle. The right iliacus appears normal.   Electronically Signed   By: Inge Rise M.D.   On: 11/21/2013 12:31   11/21/2013   CLINICAL DATA:  Lower left abdominal pain. Left hip pain. History of Hodgkin's disease. Question hemorrhage.  EXAM: CT ABDOMEN AND PELVIS WITH CONTRAST  TECHNIQUE: Multidetector CT imaging of the abdomen and pelvis was performed using the standard protocol following bolus administration of intravenous contrast.  CONTRAST:  100 mL OMNIPAQUE IOHEXOL 300 MG/ML  SOLN  COMPARISON:  CT abdomen and pelvis 11/03/2013. CT abdomen 11/13/2013.  FINDINGS: Small bilateral pleural effusions are are again seen. The patient's right pleural effusion has slightly increased since the most recent CT. Mild dependent atelectasis is seen lung bases. There is no pericardial  effusion.  Since the 10/2008/2015 study, the patient has suffered hemorrhage in the right iliacus muscle with fluid fluid levels identified and the muscle enlarged. Discrete measurement is difficult but the main area of involvement measures approximately 4.8 cm AP x 3.9 cm transverse by 6.0 cm craniocaudal. Hemorrhage extends into the left iliopsoas muscle anterior to the left hip. Stranding is seen in the left pericolic gutter compatible with hemorrhage.  Scattered cysts in the liver are unchanged. The spleen and pancreas are unremarkable. Scarring is identified in the upper pole of the left kidney. The kidneys are otherwise unremarkable.  As seen on the most recent CT scan, the left adrenal gland is indistinct and enlarged. The appearance is not slightly improved. Enlargement of the right adrenal gland is also again seen but appears improved. Stranding about the right adrenal gland shows marked improvement since the 10/2008/2015 examination.  A Foley catheter is in place in a decompressed urinary bladder. Uterus and adnexa are unremarkable. The stomach and small and large bowel appear normal. No lymphadenopathy is identified. No focal bony abnormality is identified.  IMPRESSION: Since the 10/31/2013 examination, the patient has suffered hemorrhage in the left iliacus/iliopsoas muscles. Stranding in the left pericolic gutter is seen compatible with mild hemorrhage.  Bilateral adrenal hemorrhage seen on prior studies is resolving.  Small bilateral pleural effusions, larger on the right. The right pleural effusion is increased slightly since the prior examination.  Electronically Signed: By: Inge Rise M.D. On: 11/21/2013 09:23    Scheduled Meds: . acyclovir  800 mg Oral TID  . DULoxetine  60 mg Oral QPM  . feeding supplement (ENSURE COMPLETE)  237 mL Oral TID WC  . fluconazole  100 mg Oral Daily  . fluticasone  2 spray Each Nare Daily  . gabapentin  600 mg Oral BID  . hydrocortisone  30 mg Oral Daily   . lidocaine  1 patch Transdermal Q24H  . magic mouthwash w/lidocaine  10 mL Oral Q4H  . pantoprazole  40 mg Oral BID  . potassium chloride  40 mEq Oral BID  . sodium chloride  10-40  mL Intracatheter Q12H  . sodium chloride  3 mL Intravenous Q12H   Continuous Infusions: . sodium chloride 1,000 mL (11/19/13 1437)    Principal Problem:   PNA (pneumonia) Active Problems:   Hodgkin lymphoma   Myelodysplasia   Adrenal abnormality   Rash and nonspecific skin eruption   DOE (dyspnea on exertion)   Symptomatic anemia   Melena   Adrenal infarction   Hypotension, unspecified   Sepsis   Acute renal failure    Time spent: 30 min    Khamiyah Grefe, [facility] Pager [phone]. If 7PM-7AM, please contact night-coverage at [email or web address], password [facility] 11/22/2013, 2:30 PM  LOS: 15 days

## 2013-11-22 NOTE — Progress Notes (Signed)
Lacey [name] is about the same. Her CT scan clue shows bleeding into the left  Iliacus muscle. We went ahead and gave her platelets yesterday. Her platelet count and not increased. This is certainly troublesome. We will try some more platelets today.  Her hemoglobin was down to 6.3. Sure some of this is because of this bleed. She is off Lovenox. Her right arm is still swollen but not erythematous. She will get 2 units of blood.  The oral ulcers seen return back. I will restart acyclovir.  She is on vancomy.    cin. She is on Diflucan.  Because of the bleed, she really cannot do physical therapy right now. I need to talk with orthopedic surgery to see if they would consider draining this hematoma. If so, I'll get a interventional radiology.  There is no obvious shortness of breath. She continues to diurese fairly well.  She really did not eat much yesterday.  Her blood pressure is still holding on fairly well.  I noted that her creatinine is back up a little bit. She did get the IV dye yesterday with a CT scan. Again we will have to watch her kidney function now.  Her lungs sound clear. The lobbies a slight decrease at the bases. Cardiac exam slightly tachycardic but regular. Abdomen is soft, mildly obese. There is some tenderness in the left lower quadrant region. Extremities shows good range of motion of her joints. She has decent strength in her left leg. There is a slight decrease in sensation in the outer portion of the left thigh.  We still have a ways to go before we could even consider her for discharge.  I appreciate all the great care that she is getting   Harriette Ohara 33:3

## 2013-11-22 NOTE — Progress Notes (Addendum)
ANTIBIOTIC CONSULT NOTE - INITIAL  Pharmacy Consult for vancomycin and cefepime Indication: rule out pneumonia  Allergies  Allergen Reactions  . Penicillins Hives and Rash    Patient Measurements: Height: 5' (152.4 cm) Weight: 204 lb 12.9 oz (92.9 kg) (hoyar lift) IBW/kg (Calculated) : 45.5 Adjusted Body Weight: 64.5kg  Vital Signs: Temp: 99.2 F (37.3 C) (02/25 1610) Temp src: Oral (02/25 1610) BP: 85/64 mmHg (02/25 1610) Pulse Rate: 121 (02/25 1610) Intake/Output from previous day: 02/24 0701 - 02/25 0700 In: 740 [P.O.:120; I.V.:320; Blood:300] Out: 2100 [Urine:2100] Intake/Output from this shift: Total I/O In: 1200 [I.V.:300; Blood:900] Out: -   Labs:  Recent Labs  11/20/13 0515 11/21/13 0330 11/22/13 0338  WBC 13.1* 12.6* 13.5*  HGB 8.1* 7.5* 6.3*  PLT 45* 32* 32*  CREATININE 0.99 1.00 1.55*   Estimated Creatinine Clearance: 39.8 ml/min (by C-G formula based on Cr of 1.55).   Medical History: Past Medical History  Diagnosis Date  . Cancer   . Cough productive of clear sputum 01/04/2013  . Fibromyalgia     Medications:  Scheduled:  . acyclovir  800 mg Oral TID  . ceFEPime (MAXIPIME) IV  2 g Intravenous Q24H  . DULoxetine  60 mg Oral QPM  . feeding supplement (ENSURE COMPLETE)  237 mL Oral TID WC  . fluconazole  100 mg Oral Daily  . fluticasone  2 spray Each Nare Daily  . gabapentin  600 mg Oral BID  . hydrocortisone sod succinate (SOLU-CORTEF) inj  100 mg Intravenous BID  . lidocaine  1 patch Transdermal Q24H  . magic mouthwash w/lidocaine  10 mL Oral Q4H  . pantoprazole  40 mg Oral BID  . potassium chloride  40 mEq Oral BID  . sodium chloride  10-40 mL Intracatheter Q12H  . sodium chloride  3 mL Intravenous Q12H  . vancomycin  1,250 mg Intravenous Q24H   Infusions:  . sodium chloride 1,000 mL (11/19/13 1437)   Assessment: 38 yoF admitted 2/10 with N/V, abdominal pain, fever, and melena. PMH includes Hodgkin's lymphoma, s/p chemo, and  myelodysplastic syndrome. She was initially started on broad spectrum antibiotics, which were d/c on 2/13 and 2/15. Primaxin and voriconazole were started on 2/16 for fevers, immunocompromised status, r/o infection (sinus). Patient is s/p 4 days in the ICU and was planned to finish IV antibiotics today. Pt then found to have recurrent fevers, hypotension, and tachycardia, and Pharmacy has been consulted to dose vancomycin and cefepime for possible HCAP.  Antiinfectives 2/10 >> cipro >> 2/13 2/11 >> aztreo >> 2/13 2/11 >> vanc >> 2/15, **retstart 2/17 >> 2/25, **restart 2/25 >> 2/16 >> primaxin (MD) >> 2/21 (Ennever d/c) 2/16 >> voriconazole >> 2/18 2/17 >> acyclovir >> 2/20 2/21 >> acyclovir >> 2/22 2/25 >> cefepime >>  Tmax: 100.4 WBCs: down to 12.6, increased to 13.5 (myelodysplastic and myeloproliferative d/o, steroids) Renal: Scr worsened 1.55, CrCl ~ 39 ml/min CG, 44 ml/min normlaized, UOP 1.2 ml/min PCT > 175, lactic acid 1.2 (2/18)  Microbiology 2/11 blood x2 >> NGF 2/11 influenza panel >> negative  2/14 respiratory virus panel >> negative  2/16 blood x 2 >> NGF 2/16 urine >> NGF 2/17 Sputum: normal flora 2/17 HSV (mouth) : negative 2/17 Aspergillus galactomannan Ag: Not Detected  2/18 fungitell (beta D glucan) test: in process (fyi, listed under miscellaneous test) 2/25 viral Cx: ordered 2/25 blood x2: ordered 2/25 C.diff PCR: ordered  Dose changes/drug level info:  2/13 VT at 1700 = 20.3 on 750mg  q12h,  decrease to 500mg  IV q12h 2/20 VT = 17 on 1250mg  Q24h, no change-note written   Goal of Therapy:  Vancomycin trough level 15-20 mcg/ml eradication of infection  Plan:  - cefepime 2g IV q24h - restart vancomycin 1250mg  q24h - vancomycin trough at steady state if indicated - follow-up clinical course, culture results, renal function - follow-up antibiotic de-escalation and length of therapy  Thank you for the consult.  Johny Drilling, PharmD, BCPS Pager:  [phone] Pharmacy: [phone] 11/22/2013 6:04 PM     ADDENDUM: SCr returned this evening decreased at 2.31 (CrCl 26 ml/min GC, 29 ml/min normalized)  Plan: - vancomycin dosing has been discontinued - check vancomycin trough with AM labs for further dosing - adjust cefepime to 1g IV q24h  Johny Drilling, PharmD, BCPS Pager: [phone] Pharmacy: [phone] 11/22/2013 7:47 PM

## 2013-11-22 NOTE — Progress Notes (Signed)
eLink Physician-Brief Progress Note Patient Name: Lacey [name] DOB: [birth year] MRN: [id number]  Date of Service  11/22/2013   HPI/Events of Note   Called by bedside RN for hypotension HR up some, minimal UOP but mentation normal Had been receiving diuresis ? Sepsis by primary team, source unclear Currently looks comfortable  eICU Interventions  Bolus NS now May need A-line Close monitoring   Intervention Category Major Interventions: Hypotension - evaluation and management  MCQUAID, DOUGLAS 11/22/2013, 11:12 PM

## 2013-11-22 NOTE — Progress Notes (Signed)
CRITICAL VALUE ALERT  Critical value received:  Hgb 6.3  Date of notification:  11/22/2013  Time of notification:  0400  Critical value read back:yes  Nurse who received alert:  Lawernce Ion, RN, BSN  MD notified (1st page):  Tylene Fantasia  Time of first page:  0402  MD notified (2nd page):  Time of second page:  Responding MD:    Time MD responded:

## 2013-11-23 ENCOUNTER — Inpatient Hospital Stay (HOSPITAL_COMMUNITY): Payer: BC Managed Care – PPO

## 2013-11-23 ENCOUNTER — Encounter (HOSPITAL_COMMUNITY): Admission: EM | Disposition: E | Payer: Self-pay | Source: Home / Self Care | Attending: Internal Medicine

## 2013-11-23 DIAGNOSIS — R6521 Severe sepsis with septic shock: Secondary | ICD-10-CM

## 2013-11-23 DIAGNOSIS — A419 Sepsis, unspecified organism: Secondary | ICD-10-CM

## 2013-11-23 DIAGNOSIS — M7989 Other specified soft tissue disorders: Secondary | ICD-10-CM

## 2013-11-23 DIAGNOSIS — D899 Disorder involving the immune mechanism, unspecified: Secondary | ICD-10-CM

## 2013-11-23 DIAGNOSIS — N19 Unspecified kidney failure: Secondary | ICD-10-CM

## 2013-11-23 LAB — COMPREHENSIVE METABOLIC PANEL
ALT: 39 U/L — ABNORMAL HIGH (ref 0–35)
AST: 105 U/L — AB (ref 0–37)
Albumin: 2 g/dL — ABNORMAL LOW (ref 3.5–5.2)
Alkaline Phosphatase: 65 U/L (ref 39–117)
BUN: 31 mg/dL — ABNORMAL HIGH (ref 6–23)
CALCIUM: 7.6 mg/dL — AB (ref 8.4–10.5)
CO2: 25 meq/L (ref 19–32)
Chloride: 103 mEq/L (ref 96–112)
Creatinine, Ser: 2.6 mg/dL — ABNORMAL HIGH (ref 0.50–1.10)
GFR calc non Af Amer: 19 mL/min — ABNORMAL LOW (ref >90)
GFR, EST AFRICAN AMERICAN: 22 mL/min — AB (ref >90)
Glucose, Bld: 109 mg/dL — ABNORMAL HIGH (ref 70–99)
Potassium: 3.7 mEq/L (ref 3.7–5.3)
Sodium: 143 mEq/L (ref 137–147)
Total Bilirubin: 1.1 mg/dL (ref 0.3–1.2)
Total Protein: 5.4 g/dL — ABNORMAL LOW (ref 6.0–8.3)

## 2013-11-23 LAB — CBC WITH DIFFERENTIAL/PLATELET
BASOS ABS: 0.4 10*3/uL — AB (ref 0.0–0.1)
Basophils Absolute: 0.2 10*3/uL — ABNORMAL HIGH (ref 0.0–0.1)
Basophils Relative: 1 % (ref 0–1)
Basophils Relative: 2 % — ABNORMAL HIGH (ref 0–1)
EOS ABS: 0.5 10*3/uL (ref 0.0–0.7)
Eosinophils Absolute: 0.4 10*3/uL (ref 0.0–0.7)
Eosinophils Relative: 3 % (ref 0–5)
Eosinophils Relative: 2 % (ref 0–5)
HCT: 23.4 % — ABNORMAL LOW (ref 36.0–46.0)
HCT: 22.9 % — ABNORMAL LOW (ref 36.0–46.0)
HEMOGLOBIN: 7.6 g/dL — AB (ref 12.0–15.0)
Hemoglobin: 7.5 g/dL — ABNORMAL LOW (ref 12.0–15.0)
LYMPHS ABS: 0.5 10*3/uL — AB (ref 0.7–4.0)
Lymphocytes Relative: 3 % — ABNORMAL LOW (ref 12–46)
Lymphocytes Relative: 5 % — ABNORMAL LOW (ref 12–46)
Lymphs Abs: 1 10*3/uL (ref 0.7–4.0)
MCH: 28.8 pg (ref 26.0–34.0)
MCH: 29 pg (ref 26.0–34.0)
MCHC: 32.8 g/dL (ref 30.0–36.0)
MCHC: 32.5 g/dL (ref 30.0–36.0)
MCV: 88.4 fL (ref 78.0–100.0)
MCV: 88.6 fL (ref 78.0–100.0)
MONO ABS: 2.4 10*3/uL — AB (ref 0.1–1.0)
Monocytes Absolute: 1.8 10*3/uL — ABNORMAL HIGH (ref 0.1–1.0)
Monocytes Relative: 12 % (ref 3–12)
Monocytes Relative: 12 % (ref 3–12)
NEUTROS ABS: 12 10*3/uL — AB (ref 1.7–7.7)
NEUTROS PCT: 79 % — AB (ref 43–77)
NRBC: 4 /100{WBCs} — AB
Neutro Abs: 15.5 10*3/uL — ABNORMAL HIGH (ref 1.7–7.7)
Neutrophils Relative %: 81 % — ABNORMAL HIGH (ref 43–77)
Platelets: DECREASED 10*3/uL (ref 150–400)
Platelets: 36 10*3/uL — ABNORMAL LOW (ref 150–400)
RBC: 2.64 MIL/uL — ABNORMAL LOW (ref 3.87–5.11)
RBC: 2.59 MIL/uL — ABNORMAL LOW (ref 3.87–5.11)
RDW: 23.5 % — AB (ref 11.5–15.5)
RDW: 23.5 % — AB (ref 11.5–15.5)
WBC: 15 10*3/uL — ABNORMAL HIGH (ref 4.0–10.5)
WBC: 19.7 10*3/uL — ABNORMAL HIGH (ref 4.0–10.5)
nRBC: 4 /100 WBC — ABNORMAL HIGH

## 2013-11-23 LAB — IGG, IGA, IGM
IGM, SERUM: 86 mg/dL (ref 52–322)
IgA: 188 mg/dL (ref 69–380)
IgG (Immunoglobin G), Serum: 807 mg/dL (ref 690–1700)

## 2013-11-23 LAB — PREPARE PLATELET PHERESIS: Unit division: 0

## 2013-11-23 LAB — RETICULOCYTES
RBC.: 2.59 MIL/uL — ABNORMAL LOW (ref 3.87–5.11)
RETIC COUNT ABSOLUTE: 15.5 10*3/uL — AB (ref 19.0–186.0)
Retic Ct Pct: 0.6 % (ref 0.4–3.1)

## 2013-11-23 LAB — CBC
HCT: 25.7 % — ABNORMAL LOW (ref 36.0–46.0)
HEMOGLOBIN: 8.2 g/dL — AB (ref 12.0–15.0)
MCH: 28.3 pg (ref 26.0–34.0)
MCHC: 31.9 g/dL (ref 30.0–36.0)
MCV: 88.6 fL (ref 78.0–100.0)
Platelets: DECREASED 10*3/uL (ref 150–400)
RBC: 2.9 MIL/uL — ABNORMAL LOW (ref 3.87–5.11)
RDW: 23.4 % — ABNORMAL HIGH (ref 11.5–15.5)
WBC: 14.2 10*3/uL — ABNORMAL HIGH (ref 4.0–10.5)

## 2013-11-23 LAB — CHROMOSOME ANALYSIS, BONE MARROW

## 2013-11-23 LAB — PROCALCITONIN: Procalcitonin: 66.66 ng/mL

## 2013-11-23 LAB — PROTIME-INR
INR: 1.51 — ABNORMAL HIGH (ref 0.00–1.49)
Prothrombin Time: 17.8 seconds — ABNORMAL HIGH (ref 11.6–15.2)

## 2013-11-23 LAB — VANCOMYCIN, TROUGH: VANCOMYCIN TR: 11.3 ug/mL (ref 10.0–20.0)

## 2013-11-23 LAB — CLOSTRIDIUM DIFFICILE BY PCR: Toxigenic C. Difficile by PCR: NEGATIVE

## 2013-11-23 LAB — LACTIC ACID, PLASMA: LACTIC ACID, VENOUS: 2 mmol/L (ref 0.5–2.2)

## 2013-11-23 LAB — LACTATE DEHYDROGENASE: LDH: 632 U/L — ABNORMAL HIGH (ref 94–250)

## 2013-11-23 LAB — MAGNESIUM: Magnesium: 1.2 mg/dL — ABNORMAL LOW (ref 1.5–2.5)

## 2013-11-23 LAB — SODIUM, URINE, RANDOM: Sodium, Ur: 59 mEq/L

## 2013-11-23 LAB — APTT: APTT: 42 s — AB (ref 24–37)

## 2013-11-23 LAB — CREATININE, URINE, RANDOM: Creatinine, Urine: 130.1 mg/dL

## 2013-11-23 SURGERY — BRONCHOSCOPY, WITH FLUOROSCOPY
Anesthesia: Moderate Sedation | Laterality: Bilateral

## 2013-11-23 MED ORDER — FENTANYL CITRATE 0.05 MG/ML IJ SOLN
50.0000 ug | Freq: Once | INTRAMUSCULAR | Status: AC
  Administered 2013-11-23: 50 ug via INTRAVENOUS

## 2013-11-23 MED ORDER — MIDAZOLAM HCL 2 MG/2ML IJ SOLN: 2.0000 mg | Freq: Once | INTRAMUSCULAR | Status: DC

## 2013-11-23 MED ORDER — FENTANYL CITRATE 0.05 MG/ML IJ SOLN
INTRAMUSCULAR | Status: AC
  Administered 2013-11-23: 50 ug
  Filled 2013-11-23: qty 2

## 2013-11-23 MED ORDER — KCL IN DEXTROSE-NACL 10-5-0.45 MEQ/L-%-% IV SOLN
INTRAVENOUS | Status: DC
  Administered 2013-11-23: 13:00:00 via INTRAVENOUS
  Filled 2013-11-23 (×4): qty 1000

## 2013-11-23 MED ORDER — PHENYLEPHRINE HCL 10 MG/ML IJ SOLN
30.0000 ug/min | INTRAMUSCULAR | Status: DC
  Administered 2013-11-23: 30 ug/min via INTRAVENOUS
  Administered 2013-11-23: 100 ug/min via INTRAVENOUS
  Filled 2013-11-23 (×2): qty 1

## 2013-11-23 MED ORDER — MIDAZOLAM HCL 2 MG/2ML IJ SOLN
INTRAMUSCULAR | Status: AC
  Filled 2013-11-23: qty 2

## 2013-11-23 MED ORDER — ALTEPLASE 2 MG IJ SOLR
2.0000 mg | Freq: Once | INTRAMUSCULAR | Status: AC
  Administered 2013-11-23: 2 mg
  Filled 2013-11-23: qty 2

## 2013-11-23 MED ORDER — VORICONAZOLE 200 MG PO TABS
200.0000 mg | ORAL_TABLET | Freq: Two times a day (BID) | ORAL | Status: DC
  Administered 2013-11-23 – 2013-11-25 (×5): 200 mg via ORAL
  Filled 2013-11-23 (×6): qty 1

## 2013-11-23 MED ORDER — PHENYLEPHRINE HCL 10 MG/ML IJ SOLN
30.0000 ug/min | INTRAVENOUS | Status: DC
  Administered 2013-11-23: 85 ug/min via INTRAVENOUS
  Filled 2013-11-23: qty 4

## 2013-11-23 MED ORDER — VANCOMYCIN HCL 10 G IV SOLR
1250.0000 mg | Freq: Once | INTRAVENOUS | Status: AC
  Administered 2013-11-23: 1250 mg via INTRAVENOUS
  Filled 2013-11-23: qty 1250

## 2013-11-23 MED ORDER — LOPERAMIDE HCL 2 MG PO CAPS
4.0000 mg | ORAL_CAPSULE | ORAL | Status: DC | PRN
Start: 2013-11-23 — End: 2013-12-02
  Administered 2013-11-23 – 2013-11-24 (×3): 4 mg via ORAL
  Filled 2013-11-23 (×5): qty 2

## 2013-11-23 MED ORDER — PANTOPRAZOLE SODIUM 40 MG PO TBEC
40.0000 mg | DELAYED_RELEASE_TABLET | Freq: Every day | ORAL | Status: DC
  Administered 2013-11-24 – 2013-12-02 (×9): 40 mg via ORAL
  Filled 2013-11-23 (×10): qty 1

## 2013-11-23 NOTE — Progress Notes (Signed)
+  Unfortunately she is now down the ICU. Became hypotensive. She's gone back into renal failure. I have to believe this is probably secondary to the IV dye. I does they were going to have to prevent her have him having IV dye in the future. She also was hypotensive which appears not help in the kidney function.  She will be have a bronchoscopy. I appreciate everybody's help in arranging this. I don't know she may have some type of opportunistic infection. I probably will get her back on antifungal therapy. She is on Diflucan but upon would get her onto tVfend.  She actually is better than yesterday. She is on pressor support right now. We increased her hydrocortisone.  I want to check her immunoglobulin levels. She may have low IgG levels. If so, I would give her IVIG.  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 Her platelet count is only 36. I want to make sure the blood bank gets HLA matched platelets for her.  Was to pain in the left hip. She had a bleed into that area.  She is not complaining of any shortness of breath. There is no nausea vomiting. She has no diarrhea.  We have to watch for renal function now.  I still feel that her underlying myelodysplasia is the "driver" for all these issues. I do think that she has a severely compromised immune system. I think that the only way we might be able to ultimately help is to treat the myelodysplasia and hope that her immune system improves. Ultimately, it is only going to be a bone marrow transplant that will be the factor to help her.  On her exam, her temperature is 99.1. Pulse 96. Blood pressure was 112/70. Oral exam shows the ulcers in her mouth. Lungs sounded clear bilaterally. Cardiac exam regular in rhythm. No murmurs are noted. Abdomen is soft. There is still some tenderness in the left lower quadrant. Extremities shows no clubbing cyanosis. The edema in her right arm appears much better. She has no  neurological issues. Left leg moves okay with the stable strength.  Her hemoglobin is 7.5. She got 2 units of blood yesterday.  I will try to call her husband today to update him.  I very much appreciate all the great and outstanding care that she is getting.  Pete E.  Romans 8:28

## 2013-11-23 NOTE — Progress Notes (Signed)
[facility] for Infectious Disease    Date of Admission:  11/14/2013   Total days of antibiotics 16        Day 16 vanco        Day 2 cefepime        Day 2 voriconazole   ID: Lacey [name] is a 58 y.o. female with hx of HL s/p chemo and auto BMT in 01/2013 c/b myelodysplastic disorder initially hospitalized for HAP who finished 10 day course of therapy on 2/24 but then had leukocytosis, fever, and hypotension on 2/25, now in step down icu for monitoring. During this hospitalization found to have HAP, adrenal infarct, and on 2/24 found to have hemorrhage into right iliacus muscle as well as left iliopsoas.  Principal Problem:   Septic shock Active Problems:   Hodgkin lymphoma   Myelodysplasia   Adrenal abnormality   Rash and nonspecific skin eruption   DOE (dyspnea on exertion)   Symptomatic anemia   Melena   Adrenal infarction   Hypotension, unspecified   PNA (pneumonia)   Sepsis   Acute renal failure   Pulmonary infiltrate   Fever    Subjective: In the last 24hr, she was febrile to 101.8 F, and hypotensive thus transferred to step down for evaluation. She received stress dose steroids and briefly placed on phenylephrine for vasopressor support. She was pan cultured. She was restarted on vanco, cefepime as well as voriconazole. She has been receiving pain medication for hip/abd pain she is having which is also causing her to be somewhat solomnent  Medications:  . antiseptic oral rinse  15 mL Mouth Rinse q12n4p  . ceFEPime (MAXIPIME) IV  1 g Intravenous Q24H  . chlorhexidine  15 mL Mouth Rinse BID  . DULoxetine  60 mg Oral QPM  . feeding supplement (ENSURE COMPLETE)  237 mL Oral TID WC  . fluticasone  2 spray Each Nare Daily  . hydrocortisone sod succinate (SOLU-CORTEF) inj  50 mg Intravenous Q6H  . lidocaine  1 patch Transdermal Q24H  . magic mouthwash w/lidocaine  10 mL Oral Q4H  . [START ON 11/24/2013] pantoprazole  40 mg Oral Daily  . sodium chloride  10-40 mL  Intracatheter Q12H  . sodium chloride  3 mL Intravenous Q12H  . voriconazole  200 mg Oral Q12H    Objective: Vital signs in last 24 hours: Temp:  [98.9 F (37.2 C)-101.8 F (38.8 C)] 99.3 F (37.4 C) (02/26 0800) Pulse Rate:  [87-125] 87 (02/26 0800) Resp:  [15-22] 18 (02/26 0800) BP: (44-148)/(24-67) 105/50 mmHg (02/26 0800) SpO2:  [86 %-100 %] 86 % (02/26 0800) Arterial Line BP: (104)/(65) 104/65 mmHg (02/26 0800) Physical Exam  Constitutional:  oriented to person, place, and time, easily awakens to her name. appears well-developed and well-nourished. No distress.  HENT: right lateral tongue lesions c/w aphous ulcer. Mouth/Throat: Oropharynx is clear and moist. No oropharyngeal exudate.  Cardiovascular: Normal rate, regular rhythm and normal heart sounds. Exam reveals no gallop and no friction rub.  No murmur heard.  Pulmonary/Chest: Effort normal and breath sounds normal. No respiratory distress.  has no wheezes.  Abdominal: Soft. Bowel sounds are normal.  exhibits no distension. Mild tenderness to mild percussion and palpation of left lower quadrant Lymphadenopathy: no cervical adenopathy.  Neurological: alert and oriented to person, place, and time.  Skin: Skin is warm and dry. No rash noted. No erythema. Left arm picc line is c/d/i Psychiatric: a normal mood and affect. H behavior is normal.  Lab Results  Recent Labs  11/22/13 1815 11/22/13 2356 11/15/2013 0320  WBC 14.2* 15.0* 19.7*  HGB 8.2* 7.6* 7.5*  HCT 25.7* 23.4* 22.9*  NA 142  --  143  K 3.7  --  3.7  CL 102  --  103  CO2 29  --  25  BUN 29*  --  31*  CREATININE 2.31*  --  2.60*   Liver Panel  Recent Labs  11/22/13 0338 11/21/2013 0320  PROT 5.4* 5.4*  ALBUMIN 2.1* 2.0*  AST 26 105*  ALT 15 39*  ALKPHOS 57 65  BILITOT 0.9 1.1    Microbiology: 2/25 blood cx pending 2/25 cdiff negative 2/25 viral culture 2/17 respiratory cx 2/16 urine cx negative 2/16 blood cx negative 2/16 rvp  negative 2/11 blood cx negative  Studies/Results: Dg Chest 2 View  11/22/2013   CLINICAL DATA:  Shortness of breath.  Follow-up atelectasis.  EXAM: CHEST  2 VIEW  COMPARISON:  11/18/2013  FINDINGS: Left upper extremity PICC is stable, tip at the upper right atrium. No cardiomegaly. Persistent fullness of the right hilum with perihilar opacification, partially triangular. There is elevation of the right fissure. Previous infused CTs without right hilar mass. Small pleural effusions.  IMPRESSION: 1. Unchanged right perihilar atelectasis and consolidation. 2. Small bilateral pleural effusion.   Electronically Signed   By: Jorje Guild M.D.   On: 11/22/2013 10:20   Ct Head Wo Contrast  11/22/2013   CLINICAL DATA:  Increased somnolence, thrombocytopenia  EXAM: CT HEAD WITHOUT CONTRAST  TECHNIQUE: Contiguous axial images were obtained from the base of the skull through the vertex without intravenous contrast.  COMPARISON:  CT MAXILLOFACIAL W/CM dated 11/11/2013; CT BIOPSY dated 11/09/2013  FINDINGS: Study is mildly limited to moderately limited by motion artifact. Mild age-related atrophy. No abnormal attenuation to suggest hemorrhage or infarct. No extra-axial fluid. No evidence of mass or hydrocephalus. There is a small air-fluid level in the left sphenoid sinus. Overall, sinuses are better aerated than they were on the prior study of 11/11/2013.  IMPRESSION: Persistent sphenoid sinusitis on the left. Improved aeration in the visualized paranasal sinuses otherwise. No acute intracranial abnormalities. Study somewhat limited as described above.   Electronically Signed   By: Skipper Cliche M.D.   On: 11/22/2013 15:38    Assessment/Plan: Intermittent fever concern for hospital acquired infection = continue empiric antibiotics of vanco, cefepime, and voriconazole. Markers for invasive fungal infection of beta D glucan was negative on 2/18. Her myelodysplastic disorder makes it difficult to determine if her  leukocytosis is due to stress from infection. We will wait to see what her culture data reveals to determine if need to continue antibiotics. She has been on antibiotics for roughly 14days prior to this episode. 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  If she is still febrile, would recommend to remove picc line as potential source of infection.  Hypotension = continue with stress dose steroids. Continue to wean phenyl as tolerated    Davonn Flanery, [facility] for Infectious Diseases Cell: [phone] Pager: [phone]  11/16/2013, 11:04 AM

## 2013-11-23 NOTE — Progress Notes (Signed)
eLink Physician-Brief Progress Note Patient Name: Lacey [name] DOB: 07-04-1956 MRN: [id number]  Date of Service  11/22/2013   HPI/Events of Note   Hypotension continues, exam unchanged; not clear cause; currently on stress dose steroids  eICU Interventions  Place arterial line neosynephrine drip for BP support Labs: cbc, procalcitonin, lactic acid   Intervention Category Major Interventions: Hypotension - evaluation and management  MCQUAID, DOUGLAS 11/07/2013, 12:49 AM

## 2013-11-23 NOTE — Progress Notes (Signed)
Lawrence Santiago, patient's family friend, stated she was taking all patient's personal belongings home with her and would then return the items in the morning after patient transferred to new unit so belongings would not get lost in the transition.  No other concerns at this time.  Patient verbalized understanding.  Iantha Fallen RN 11/22/13 8:30 PM

## 2013-11-23 NOTE — Procedures (Signed)
Arterial Catheter Insertion Procedure Note MAZY CULTON [id number] [birth year]  Procedure: Insertion of Arterial Catheter  Indications: Blood pressure monitoring  Procedure Details Consent: Risks of procedure as well as the alternatives and risks of each were explained to the (patient/caregiver).  Consent for procedure obtained. Time Out: Verified patient identification, verified procedure, site/side was marked, verified correct patient position, special equipment/implants available, medications/allergies/relevent history reviewed, required imaging and test results available.  Performed  Maximum sterile technique was used including antiseptics, cap, gloves, gown, hand hygiene, mask and sheet. Skin prep: Chlorhexidine; local anesthetic administered 20 gauge catheter was inserted into right radial artery using the Seldinger technique.  Evaluation Blood flow good; BP tracing good. Complications: No apparent complications.   Rosann Auerbach 11/29/2013

## 2013-11-23 NOTE — Consult Note (Signed)
WOC wound consult note Reason for Consult:ecchymotic area at sacrum. Staff RN concerned that this was a SDTI. Wound type:Echymosis due to trauma at sacrum.  Patient states that this is the site of a bone marrow withdrawal. Pressure Ulcer POA: No Measurement:3cm x 2cm with a pinhole (needle mark) in the center).  No depth Wound bed: No wound bed Drainage (amount, consistency, odor) None Periwound:intact Dressing procedure/placement/frequency:We will place a soft silicone foam dressing over this area and continue to turn patient side to side until resolved.  Timely incontinence care is provided per our routine. [place] nursing team will not follow, but will remain available to this patient, the nursing and medical team.  Please re-consult if needed. Thanks, Maudie Flakes, MSN, RN, [place], [place], [place] [phone])

## 2013-11-23 NOTE — Progress Notes (Signed)
Patient transferred to [facility] [place]. Notified [place] CSW of transfer.  Carigan Lister C. [place] MSW, [place]

## 2013-11-23 NOTE — Procedures (Signed)
Indication:   Persistent fever and RML opacity    Premedication/sedation:  Nebulized lidocaine midaz 3 mg Fentanyl 50 mcg  Anesthesia: 1% lidocaine - 30cc  Procedure: After adequate sedation and anesthesia, the bronchoscope was introduced via the L naris and advanced into the posterior pharynx. Further anesthesia was obtained with 1% lidocaine and the scope was advanced into the trachea. Complete airway anesthesia was achieved with 1% lidocaine and a thorough airway examination was performed. This revealed the following: Findings:  Normal airway anatomy Mild airway inflammation Slightly torsed take off of RML bronchus but no EB obstruction or FBs Minimal mucoid secretions  Specimens:   RML BAL specimen sent for micro (bact, AFB, fungus, pneumocystis, legionella) and cytology  Post procedure evaluation:  The patient tolerated the procedure well with no major complications    Merton Border, MD;  PCCM service; Mobile [phone]

## 2013-11-23 NOTE — Progress Notes (Addendum)
PULMONARY/CCM CONSULT NOTE  Requesting MD/Service: [facility]/[place] Date of admission: 2/10 Date of consult: 2/25 Reason for consultation: Abnormal CXR/CT chest. Consider FOB  Pt Profile: 9 F with hx of Hodgkin's lymphoma and MDS admitted 2/10 with fever, N/V and abdominal pain. Her admitting dx was pyelonephritis. Her hospitalization has been lengthy and complicated. She has had intermittent low grade fevers, leukocytosis and persistent R suprahilar density on CXR. A CT chest 2/23 is discussed below and the CT findings have prompted PCCM consult to consider FOB.   SUBJ: Events of last night noted - hypotension. Phenylephrine initiated  Filed Vitals:   11/11/2013 1400 10/30/2013 1500 10/30/2013 1600 11/10/2013 1615  BP:      Pulse: 104 104    Temp: 99.7 F (37.6 C) 99.7 F (37.6 C) 99.5 F (37.5 C) 99.5 F (37.5 C)  TempSrc:      Resp: 14 16 18 19   Height:      Weight:      SpO2: 96% 98% 99% 99%    EXAM:  Gen: lethargic, NAD HEENT: WNL, PERRL, EOMI Neck: no JVD Lungs: clear anteriorly without wheezes Cardiovascular: RRR s M Abdomen: obese, soft, NT, +BS Warm: no C/C/E Neuro: no focal deficit  DATA:  I have reviewed all of today's lab results. Relevant abnormalities are discussed in the A/P section  CXR: R suprahilar density  - triangular in shape with appearance of atelectasis    IMPRESSION:   H/O lymphoma and MDS Intermittent fever, leukocytosis. Markedly elevated PCT Persistent RML density - atelectasis and/or infiltrate. Hypotension - suspect multifactorial  Suspect adrenal insuff (adrenal hemorrhage noted on CT abd)  Possible component of SIRS  NIBP inaccuracy (MAP on A-line approx 10-15 mmHg higher) Acute on chronic renal insufficiency   PLAN:  FOB performed 2/26 - normal airway exam and minimal secretions Wean pressors to off for MAP > 65 mmHg by A-line Have asked ID to eval again - all abx per ID service  Family and pt updated @ bedside from [place], MD ; [facility] service Mobile [phone].  After 5:30 PM or weekends, call [phone]

## 2013-11-23 NOTE — Progress Notes (Signed)
VASCULAR LAB PRELIMINARY  PRELIMINARY  PRELIMINARY  PRELIMINARY  Left upper extremity venous duplex completed.    Preliminary report:  Left:  No evidence of DVT or superficial thrombosis.    Kenai Fluegel, RVT 11/03/2013, 9:24 AM

## 2013-11-23 NOTE — Progress Notes (Signed)
ANTIBIOTIC CONSULT NOTE - INITIAL  Pharmacy Consult for voriconazole  Indication: fever, immunocompromised status, ?sinus infection  Allergies  Allergen Reactions  . Penicillins Hives and Rash    Patient Measurements: Height: 5' (152.4 cm) Weight: 204 lb 12.9 oz (92.9 kg) (hoyar lift) IBW/kg (Calculated) : 45.5  Vital Signs: Temp: 99.1 F (37.3 C) (02/26 0615) BP: 71/24 mmHg (02/26 0100) Pulse Rate: 96 (02/26 0615)  Labs:  Recent Labs  11/22/13 0338 11/22/13 1142 11/22/13 1815 11/22/13 2356 2013/11/26 0320  WBC 13.5*  --  14.2* 15.0* 19.7*  HGB 6.3*  --  8.2* 7.6* 7.5*  PLT 32*  --  PLATELET CLUMPS NOTED ON SMEAR, COUNT APPEARS DECREASED PLATELET CLUMPS NOTED ON SMEAR, COUNT APPEARS DECREASED 36*  LABCREA  --  130.1  --   --   --   CREATININE 1.55*  --  2.31*  --  2.60*   Estimated Creatinine Clearance: 23.7 ml/min (by C-G formula based on Cr of 2.6).  Assessment: 7 yof with h/o Hodgkin's lymphoma, treatment related myelodysplasia admitted 2/10 with UTI/pyelonephritis and bronchitis. Transferred to ICU overnight 2/25 due to hypotension, now in renal failure possibly from IVP dye. Has been on multiple anti-infectives as listed below. Oncology changing fluconazole to voriconazole today per Pharmacy protocol.  2/10 >> cipro >> 2/13 2/11 >> aztreo >> 2/13 2/11 >> vanc >> 2/15, **retstart 2/17 >> 2/25, **restart 2/25 >> 2/16 >> primaxin >> 2/21 2/16 >> voriconazole >> 2/18 2/17 >> acyclovir >> 2/20 2/21 >> acyclovir >> 2/22 2/21 >> fluconazole >> 2/26 2/25 >> cefepime >> 2/25 >> restart vancomycin >> 2/26 >> voriconazole >>  Tmax: 101.8 WBCs: 19.7kK  Renal: ARF, SCr 1.55 > 2.31 > 2.6, CrCl 23 ml/min  2/11 blood x2 >> NGF 2/11 influenza panel >> negative  2/14 respiratory virus panel >> negative  2/16 blood x 2 >> NGF 2/16 urine >> NGF 2/17 Sputum: normal flora 2/17 HSV (mouth) : negative 2/17 Aspergillus galactomannan Ag: Not Detected  2/18 fungitell (beta D  glucan) test: neg 2/25 viral Cx: sent 2/25 blood x1: sent 2/25 C.diff PCR: neg  For CrCl <50 mL/minute: There are no specific dosage adjustments provided in the manufacturer's labeling. Due to accumulation of the intravenous vehicle (cyclodextrin), the manufacturer recommends the use of oral voriconazole in these patients unless an assessment of the benefit:risk justifies the use of IV voriconazole. Patient is taking other PO meds   Plan:   Voriconazole 200mg  PO BID  If patient has inadequate clinical response, titrate in 5 100 mg/dose increments for weight > 40 kg (maxium 600mg /day)  Peggyann Juba, PharmD, BCPS Pager: [phone] 11/26/2013 8:19 AM

## 2013-11-23 NOTE — Progress Notes (Signed)
OT Cancellation Note  Patient Details Name: ANIRA SENEGAL MRN: [id number] DOB: [birth year]   Cancelled Treatment:    Reason Eval/Treat Not Completed: Patient at procedure or test/ unavailable  - will reattempt   Billiejean Schimek, Ellard Artis M 11/02/2013, 12:03 PM

## 2013-11-23 NOTE — Progress Notes (Signed)
Video Bronchoscopy at bedside Good patient tolerance  Intervention bronchial washing.   Merton Border, MD ; [facility] [phone].  After 5:30 PM or weekends, call [phone]

## 2013-11-23 NOTE — Progress Notes (Signed)
[id number] Kinya Meine,RN,BSN,CCM [phone] Chart review for discharge needs and patient progress. patient transferred back to icu on [id number] due to hypotensive episode and requiring pressure support/pt with multiple problems/hemorrhage of muscles of buttock, adrenal infact, resp  and pna complications.

## 2013-11-23 NOTE — Progress Notes (Addendum)
[facility] PROGRESS NOTE  Lacey [name] NTI:[id number] DOB: June 28, 1956 DOA: 11/19/2013 PCP: Vena Austria, MD  Assessment/Plan  CV: Septic v. Hypovolemic shock v. Adrenal crisis.   Tele:  HR trending down to wnl this morning (prev tachy to 120s) -   Appreciate PCCM assistance -   Lactic trending down and uop stable -   Judicious IVF and wean neo as tolerated -   Continue stress dose steroids -  Tx for sepsis as below  -  May need to d/c PICC  Chronic diastolic heart failure.  Wt down 5kg in last 3 days.  Preserved EF and grade 1 diastolic heart failure on echocardiogram from a few days ago. -  Judicious use of IVF  Adrenal Insufficiency; Adrenal infarct / hemorrhage; Continue with Hydrocortisone. Change to oral 2-20.  -  stress dose steroids  ID:  Recurrent fever -  Appreciate ID assistance -  Repeat BCx pending -  CXR:  Stable consolidation -  UA:  Neg except for high spec grav -  CT chest on 2/23:  Persistent consolidation RML with new peribronchovascular opacities suggestive of bronchopneumonia.   -  Continue vanc and cefepime -  Check legionella and s. pneumo PCR -  Pulmonology consulted for possible bronch this AM -  C. Diff PCR pending -  Consider CT angio to eval for PE if renal function improves, however, no A/C at this time seconadry to retroperitoneal bleed.  VQ likely not helpful given PNA and effusion  [facility] [employer] PNA;  Consolidation in the medial aspect of the right upper lobe. Partial loculation of the right pleural effusion anteriorly is also noted.  -  Influenza negative.  -  Aspergillus antigen no detected.  -  No enough fluid to allow thoracentesis.  -  Completed 14 days of antibiotics, last day of vanc was 2/24, last primaxin 2/21  Diarrhea:   -  C. Diff pending  Mouth Ulcer; Herpes virus culture no herpes detected. Received acyclovir for 3 days.  -  D/c acyclovir   Soft tissue stranding in the anterior mediastinum and  left thigh is of uncertain etiology and significance. If there been recent attempted central line placement, this could be iatrogenic. Less likely, this might be infectious or inflammatory, and clinical correlation for signs and symptoms of mediastinitis may be warranted  -  Unclear etiology, but not complaining of chest pain  Skin eruptions  -Resolving. Etiology unclear  RENAL: Acute Renal Failure; recurrent, likely secondary to overdiuresis vs. Early c. Diff.   -  FENa pending -  RUS pending -  Repeat BMP:  AKI with very low CrCl -  Pharmacy assisting with renally dosing medications -   Minimize nephrotoxins  NEURO Lethargy, resolved.  May have been lingering effects of gabapentin and narcotics in setting of AKI -  CT head to rule out spontaneous hemorrhage given thrombocytopenia -  Ammonia level:  19 -  Continue to minimize sedating medications  HEME Acute left anterior thigh pain, possible hematoma, vs. Early cellulitis or inflammatory state -  Hold abx -  Ultram as needed for pain -  Lidocaine patch  Spontaneous retroperitoneal bleed in setting of thrombocytopenia -  Transfuse PRBC and plt -  INR 1.27 -  Consider repeat CT abd/pelvis if anemia continues to recur quickly  Mixed acute blood loss anemia plus marrow failure; History of Hodgkin Lymphoma: Appreciate Dr Jonette Eva assistance. LDH increase to 737.. - Bone Marrow Biopsy: HYPERCELLULAR BONE MARROW FOR AGE WITH DYSPOIETIC CHANGES.  S/P 2 units of PRBC 2-20. Hb at 9.  -  Transfused 2 units PRBC and 1 unit plt.  No change in plt count  Right upper extremity superficial thrombosis;Swelling decreasing.  Increased LUE swelling -  Lovenox on hold due to new left hemorrhage in the left iliacus/iliopsoas muscles.  -  Duplex LUE  Thrombocytopenia:  No schistocytes to suggest DIC or TTP, but concerning given fever, AKI and AMS.  Reassuring that LDH is trending down. -  Repeat CBC with diff and smear in AM -  Transfuse 2 units of  platelets prior to bronchoscopy  FEN: Hypokalemia;  -  Oral potassium repletion  GI:  Mild elevation in LFTs may be due to sepsis vs. Early cardiohepatic syndrome.  Bili and alk phos stable.   -  Repeat in AM  Diet:  regular Access:  PICC IVF:  on Proph:  Hold for thrombocytopenia, spont retroperitoneal bleed  Code Status: full Family Communication: patient alone Disposition Plan: pending improvement in fevers and mentation   Consultants:  Dr. Burney Gauze (oncology)  Dr Gibson Ramp (Surgery) curbside consult  Dr. Carlyle Basques (infectious disease)  Procedures:  11/12/2013 right upper extremity Doppler  No evidence of deep vein thrombosis involving the right upper extremity, left subclavian vein, and left internal jugular vein. There is superficial thrombosis noted in the right cephalic vein.  -6/81 Bone marrow biopsy report pending  Echocardiogram 01/10/2013  - Left ventricle: mild focal basal hypertrophy of the septum.  -LVEF= ejection fraction was 55%. Wall motion was normal; there were no regional wall motion abnormalities.  -(grade 1 diastolic dysfunction).  - Aortic valve: There was no stenosis. Trivial regurgitation. - Mitral valve: Trivial regurgitation. - Left atrium: The atrium was mildly dilated. - Right ventricle: The cavity size was normal. Systolic function was normal. - Tricuspid valve: Peak RV-RA gradient: 52m Hg (S). - Pulmonary arteries: PA peak pressure: 214mHg (S). - Inferior vena cava: The vessel was normal in size; the respirophasic diameter changes were in the normal range (=50%); findings are consistent with normal central venous pressure.  Antibiotics:  Aztreonam 2/11>> stopped 2/13  Ciprofloxacin 2/11>> stopped 2/15  Vancomycin 2/11>> stopped 2/15  Primaxin>> 2/16>> 2/21 Voriconazole 2/16>> stopped 2/18  Vancomycin 2/17>> 2/24  HPI/Subjective:  Patient much more alert.  Mild cough without SOB.  Denies nausea, but has diffuse abdominal  tenderness worse in LLQ.    Objective: Filed Vitals:   11/20/2013 0530 11/02/2013 0545 10/29/2013 0600 10/29/2013 0615  BP:      Pulse: 95 94 95 96  Temp: 99.1 F (37.3 C) 99.1 F (37.3 C) 99.1 F (37.3 C) 99.1 F (37.3 C)  TempSrc:      Resp: _0 Height:      Weight:      SpO2: 98% 99% 98% 95%    Intake/Output Summary (Last 24 hours) at 11/14/2013 0734 Last data filed at 11/21/2013 0529  Gross per 24 hour  Intake 5714.5 ml  Output    740 ml  Net 4974.5 ml   Filed Weights   11/19/13 0528 11/20/13 0522 11/22/13 0500  Weight: 97.977 kg (216 lb) 96.435 kg (212 lb 9.6 oz) 92.9 kg (204 lb 12.9 oz)    Exam:   General:  CF, No acute distress  HEENT:  NCAT, MMM  Cardiovascular:  Tachycardic RR, nl S1, S2 no mrg, 2+ pulses, warm extremities  Respiratory:  + wheezes with faint posterior rales, no obvious rhonchi, but difficult exam, no  increased WOB  Abdomen:   NABS, soft, mildly distended, mild TTP particularly in LLQ without rebound or guarding  MSK:   Normal tone and bulk, 2+ RUE edema, 1+ LUE, and bilateral LE edema pitting.  A-line right radial, PICC left arm  Neuro:  Grossly intact  Psych:  Much more alert and communicative this morning  Data Reviewed: Basic Metabolic Panel:  Recent Labs Lab 11/19/13 0400 11/20/13 0515 11/21/13 0330 11/22/13 0338 11/22/13 1815 11/06/2013 0320  NA 147 144 140 142 142 143  K 3.1* 3.1* 3.4* 3.2* 3.7 3.7  CL 107 103 99 99 102 103  CO2 29 32 31 33* 29 25  GLUCOSE 140* 133* 163* 109* 84 109*  BUN 28* _0 29* 31*  CREATININE 1.15* 0.99 1.00 1.55* 2.31* 2.60*  CALCIUM 8.1* 7.7* 7.9* 7.9* 7.7* 7.6*  MG 1.7 1.5 1.5 1.5  --  1.2*   Liver Function Tests:  Recent Labs Lab 11/19/13 0400 11/20/13 0515 11/21/13 0330 11/22/13 0338 11/20/2013 0320  AST _1 105*  ALT _2 39*  ALKPHOS 65 63 71 57 65  BILITOT 1.3* 1.2 1.1 0.9 1.1  PROT 5.9* 5.5* 5.8* 5.4* 5.4*  ALBUMIN 2.2* 2.2* 2.3* 2.1* 2.0*   No results  found for this basename: LIPASE, AMYLASE,  in the last 168 hours  Recent Labs Lab 11/22/13 1815  AMMONIA 19   CBC:  Recent Labs Lab 11/20/13 0515 11/21/13 0330 11/22/13 0338 11/22/13 1815 11/22/13 2356 11/05/2013 0320  WBC 13.1* 12.6* 13.5* 14.2* 15.0* 19.7*  NEUTROABS 9.5* 8.1* 9.3*  --  12.0* 15.5*  HGB 8.1* 7.5* 6.3* 8.2* 7.6* 7.5*  HCT 24.7* 22.8* 19.7* 25.7* 23.4* 22.9*  MCV 90.8 92.3 94.3 88.6 88.6 88.4  PLT 45* 32* 32* PLATELET CLUMPS NOTED ON SMEAR, COUNT APPEARS DECREASED PLATELET CLUMPS NOTED ON SMEAR, COUNT APPEARS DECREASED 36*   Cardiac Enzymes:  Recent Labs Lab 11/18/13 0917 11/22/13 1815  CKTOTAL 56  --   CKMB 1.0  --   TROPONINI <0.30 <0.30   BNP (last 3 results)  Recent Labs  11/14/13 0435 11/17/13 0800 11/20/13 0515  PROBNP 2977.0* 9541.0* 3178.0*   CBG: No results found for this basename: GLUCAP,  in the last 168 hours  Recent Results (from the past 240 hour(s))  MRSA PCR SCREENING     Status: None   Collection Time    11/13/13  6:04 PM      Result Value Ref Range Status   MRSA by PCR NEGATIVE  NEGATIVE Final   Comment:            The GeneXpert MRSA Assay (FDA     approved for NASAL specimens     only), is one component of a     comprehensive MRSA colonization     surveillance program. It is not     intended to diagnose MRSA     infection nor to guide or     monitor treatment for     MRSA infections.  URINE CULTURE     Status: None   Collection Time    11/13/13  8:55 PM      Result Value Ref Range Status   Specimen Description URINE, CATHETERIZED   Final   Special Requests NONE   Final   Culture  Setup Time     Final   Value: 11/14/2013 01:07     Performed at [facility]  Final   Value: NO GROWTH     Performed at [facility]   Culture     Final   Value: NO GROWTH     Performed at [facility]   Report Status 11/14/2013 FINAL   Final  CULTURE, EXPECTORATED SPUTUM-ASSESSMENT      Status: None   Collection Time    11/14/13  4:36 PM      Result Value Ref Range Status   Specimen Description SPUTUM   Final   Special Requests Immunocompromised   Final   Sputum evaluation     Final   Value: THIS SPECIMEN IS ACCEPTABLE. RESPIRATORY CULTURE REPORT TO FOLLOW.   Report Status 11/14/2013 FINAL   Final  CULTURE, RESPIRATORY (NON-EXPECTORATED)     Status: None   Collection Time    11/14/13  4:36 PM      Result Value Ref Range Status   Specimen Description SPUTUM   Final   Special Requests NONE   Final   Gram Stain     Final   Value: RARE WBC PRESENT, PREDOMINANTLY PMN     RARE SQUAMOUS EPITHELIAL CELLS PRESENT     NO ORGANISMS SEEN     Performed at [facility]   Culture     Final   Value: NORMAL OROPHARYNGEAL FLORA     Performed at [facility]   Report Status 11/17/2013 FINAL   Final  HERPES SIMPLEX VIRUS CULTURE     Status: None   Collection Time    11/14/13  6:49 PM      Result Value Ref Range Status   Specimen Description MOUTH   Final   Special Requests Immunocompromised   Final   Culture     Final   Value: No Herpes Simplex Virus detected.     Performed at [facility]   Report Status 11/16/2013 FINAL   Final  CLOSTRIDIUM DIFFICILE BY PCR     Status: None   Collection Time    11/22/13  7:34 PM      Result Value Ref Range Status   C difficile by pcr NEGATIVE  NEGATIVE Final   Comment: Performed at [facility]     Studies: Dg Chest 2 View  11/22/2013   CLINICAL DATA:  Shortness of breath.  Follow-up atelectasis.  EXAM: CHEST  2 VIEW  COMPARISON:  11/18/2013  FINDINGS: Left upper extremity PICC is stable, tip at the upper right atrium. No cardiomegaly. Persistent fullness of the right hilum with perihilar opacification, partially triangular. There is elevation of the right fissure. Previous infused CTs without right hilar mass. Small pleural effusions.  IMPRESSION: 1. Unchanged right perihilar atelectasis and consolidation.  2. Small bilateral pleural effusion.   Electronically Signed   By: Jorje Guild M.D.   On: 11/22/2013 10:20   Ct Head Wo Contrast  11/22/2013   CLINICAL DATA:  Increased somnolence, thrombocytopenia  EXAM: CT HEAD WITHOUT CONTRAST  TECHNIQUE: Contiguous axial images were obtained from the base of the skull through the vertex without intravenous contrast.  COMPARISON:  CT MAXILLOFACIAL W/CM dated 11/11/2013; CT BIOPSY dated 11/09/2013  FINDINGS: Study is mildly limited to moderately limited by motion artifact. Mild age-related atrophy. No abnormal attenuation to suggest hemorrhage or infarct. No extra-axial fluid. No evidence of mass or hydrocephalus. There is a small air-fluid level in the left sphenoid sinus. Overall, sinuses are better aerated than they were on the prior study of 11/11/2013.  IMPRESSION: Persistent sphenoid sinusitis on  the left. Improved aeration in the visualized paranasal sinuses otherwise. No acute intracranial abnormalities. Study somewhat limited as described above.   Electronically Signed   By: Skipper Cliche M.D.   On: 11/22/2013 15:38   Ct Abdomen Pelvis W Contrast  11/21/2013   ADDENDUM REPORT: 11/21/2013 12:31  ADDENDUM: This is for the purpose of correcting the second paragraph in the findings which refers to the right iliacus muscle. The hemorrhage is in the patient's left iliacus muscle. The right iliacus appears normal.   Electronically Signed   By: Inge Rise M.D.   On: 11/21/2013 12:31   11/21/2013   CLINICAL DATA:  Lower left abdominal pain. Left hip pain. History of Hodgkin's disease. Question hemorrhage.  EXAM: CT ABDOMEN AND PELVIS WITH CONTRAST  TECHNIQUE: Multidetector CT imaging of the abdomen and pelvis was performed using the standard protocol following bolus administration of intravenous contrast.  CONTRAST:  100 mL OMNIPAQUE IOHEXOL 300 MG/ML  SOLN  COMPARISON:  CT abdomen and pelvis 11/13/2013. CT abdomen 11/13/2013.  FINDINGS: Small bilateral pleural  effusions are are again seen. The patient's right pleural effusion has slightly increased since the most recent CT. Mild dependent atelectasis is seen lung bases. There is no pericardial effusion.  Since the 10/2008/2015 study, the patient has suffered hemorrhage in the right iliacus muscle with fluid fluid levels identified and the muscle enlarged. Discrete measurement is difficult but the main area of involvement measures approximately 4.8 cm AP x 3.9 cm transverse by 6.0 cm craniocaudal. Hemorrhage extends into the left iliopsoas muscle anterior to the left hip. Stranding is seen in the left pericolic gutter compatible with hemorrhage.  Scattered cysts in the liver are unchanged. The spleen and pancreas are unremarkable. Scarring is identified in the upper pole of the left kidney. The kidneys are otherwise unremarkable.  As seen on the most recent CT scan, the left adrenal gland is indistinct and enlarged. The appearance is not slightly improved. Enlargement of the right adrenal gland is also again seen but appears improved. Stranding about the right adrenal gland shows marked improvement since the 10/2008/2015 examination.  A Foley catheter is in place in a decompressed urinary bladder. Uterus and adnexa are unremarkable. The stomach and small and large bowel appear normal. No lymphadenopathy is identified. No focal bony abnormality is identified.  IMPRESSION: Since the 11/12/2013 examination, the patient has suffered hemorrhage in the left iliacus/iliopsoas muscles. Stranding in the left pericolic gutter is seen compatible with mild hemorrhage.  Bilateral adrenal hemorrhage seen on prior studies is resolving.  Small bilateral pleural effusions, larger on the right. The right pleural effusion is increased slightly since the prior examination.  Electronically Signed: By: Inge Rise M.D. On: 11/21/2013 09:23    Scheduled Meds: . antiseptic oral rinse  15 mL Mouth Rinse q12n4p  . ceFEPime (MAXIPIME) IV   1 g Intravenous Q24H  . chlorhexidine  15 mL Mouth Rinse BID  . DULoxetine  60 mg Oral QPM  . feeding supplement (ENSURE COMPLETE)  237 mL Oral TID WC  . fluconazole  100 mg Oral Daily  . fluticasone  2 spray Each Nare Daily  . hydrocortisone sod succinate (SOLU-CORTEF) inj  50 mg Intravenous Q6H  . lidocaine  1 patch Transdermal Q24H  . magic mouthwash w/lidocaine  10 mL Oral Q4H  . pantoprazole  40 mg Oral BID  . sodium chloride  10-40 mL Intracatheter Q12H  . sodium chloride  3 mL Intravenous Q12H  . vancomycin  1,250 mg  Intravenous Once   Continuous Infusions: . sodium chloride 100 mL/hr at 11/22/2013 0529  . phenylephrine (NEO-SYNEPHRINE) Adult infusion 85 mcg/min (11/19/2013 0514)    Principal Problem:   PNA (pneumonia) Active Problems:   Hodgkin lymphoma   Myelodysplasia   Adrenal abnormality   Rash and nonspecific skin eruption   DOE (dyspnea on exertion)   Symptomatic anemia   Melena   Adrenal infarction   Hypotension, unspecified   Sepsis   Acute renal failure   Pulmonary infiltrate   Fever    Time spent: 30 min    Nekisha Mcdiarmid, [facility] Pager [phone]. If 7PM-7AM, please contact night-coverage at [email or web address], password [facility] 11/06/2013, 7:34 AM  LOS: 16 days

## 2013-11-23 NOTE — Progress Notes (Signed)
Rx Brief Antiobiotic note:  IV Vancomycin  Assessement:  VT= 11.3 after 1250mg  (LD 2/24 @ 0955)  SCr still rising 2.31 >2.60 this am  Plan:  Will redose with 1250mg  x1 this am  Recheck VT 2/27 am.  Dorrene German 11/22/2013 6:21 AM

## 2013-11-24 ENCOUNTER — Inpatient Hospital Stay (HOSPITAL_COMMUNITY): Payer: BC Managed Care – PPO

## 2013-11-24 LAB — CBC WITH DIFFERENTIAL/PLATELET
Basophils Absolute: 0.3 10*3/uL — ABNORMAL HIGH (ref 0.0–0.1)
Basophils Relative: 2 % — ABNORMAL HIGH (ref 0–1)
Eosinophils Absolute: 0.3 10*3/uL (ref 0.0–0.7)
Eosinophils Relative: 2 % (ref 0–5)
HCT: 21 % — ABNORMAL LOW (ref 36.0–46.0)
Hemoglobin: 7 g/dL — ABNORMAL LOW (ref 12.0–15.0)
Lymphocytes Relative: 13 % (ref 12–46)
Lymphs Abs: 1.9 10*3/uL (ref 0.7–4.0)
MCH: 28.9 pg (ref 26.0–34.0)
MCHC: 33.3 g/dL (ref 30.0–36.0)
MCV: 86.8 fL (ref 78.0–100.0)
MONO ABS: 1.6 10*3/uL — AB (ref 0.1–1.0)
Monocytes Relative: 11 % (ref 3–12)
Neutro Abs: 10.6 10*3/uL — ABNORMAL HIGH (ref 1.7–7.7)
Neutrophils Relative %: 72 % (ref 43–77)
PLATELETS: 23 10*3/uL — AB (ref 150–400)
RBC: 2.42 MIL/uL — ABNORMAL LOW (ref 3.87–5.11)
RDW: 23.9 % — ABNORMAL HIGH (ref 11.5–15.5)
WBC: 14.7 10*3/uL — ABNORMAL HIGH (ref 4.0–10.5)

## 2013-11-24 LAB — COMPREHENSIVE METABOLIC PANEL
ALBUMIN: 1.8 g/dL — AB (ref 3.5–5.2)
ALT: 40 U/L — ABNORMAL HIGH (ref 0–35)
AST: 55 U/L — ABNORMAL HIGH (ref 0–37)
Alkaline Phosphatase: 61 U/L (ref 39–117)
BILIRUBIN TOTAL: 0.4 mg/dL (ref 0.3–1.2)
BUN: 32 mg/dL — AB (ref 6–23)
CO2: 25 mEq/L (ref 19–32)
CREATININE: 2.06 mg/dL — AB (ref 0.50–1.10)
Calcium: 7.5 mg/dL — ABNORMAL LOW (ref 8.4–10.5)
Chloride: 105 mEq/L (ref 96–112)
GFR calc Af Amer: 29 mL/min — ABNORMAL LOW (ref >90)
GFR calc non Af Amer: 25 mL/min — ABNORMAL LOW (ref >90)
GLUCOSE: 172 mg/dL — AB (ref 70–99)
Potassium: 3.1 mEq/L — ABNORMAL LOW (ref 3.7–5.3)
Sodium: 144 mEq/L (ref 137–147)
TOTAL PROTEIN: 5.5 g/dL — AB (ref 6.0–8.3)

## 2013-11-24 LAB — MAGNESIUM: Magnesium: 1.7 mg/dL (ref 1.5–2.5)

## 2013-11-24 LAB — RETICULOCYTES
RBC.: 2.42 MIL/uL — AB (ref 3.87–5.11)
Retic Count, Absolute: 9.7 10*3/uL — ABNORMAL LOW (ref 19.0–186.0)
Retic Ct Pct: 0.4 % (ref 0.4–3.1)

## 2013-11-24 LAB — LEGIONELLA ANTIGEN, URINE: LEGIONELLA ANTIGEN, URINE: NEGATIVE

## 2013-11-24 LAB — PNEUMOCYSTIS JIROVECI SMEAR BY DFA: PNEUMOCYSTIS JIROVECI AG: NEGATIVE

## 2013-11-24 LAB — VANCOMYCIN, RANDOM: Vancomycin Rm: 15.7 ug/mL

## 2013-11-24 LAB — LACTATE DEHYDROGENASE: LDH: 625 U/L — AB (ref 94–250)

## 2013-11-24 LAB — PROCALCITONIN: Procalcitonin: 51.71 ng/mL

## 2013-11-24 LAB — PLATELET COUNT: PLATELETS: 22 10*3/uL — AB (ref 150–400)

## 2013-11-24 LAB — PREPARE RBC (CROSSMATCH)

## 2013-11-24 MED ORDER — VANCOMYCIN HCL 10 G IV SOLR
1250.0000 mg | INTRAVENOUS | Status: DC
  Administered 2013-11-24 – 2013-11-26 (×3): 1250 mg via INTRAVENOUS
  Filled 2013-11-24 (×4): qty 1250

## 2013-11-24 MED ORDER — POTASSIUM CHLORIDE CRYS ER 20 MEQ PO TBCR
40.0000 meq | EXTENDED_RELEASE_TABLET | Freq: Once | ORAL | Status: AC
  Administered 2013-11-24: 40 meq via ORAL
  Filled 2013-11-24: qty 2

## 2013-11-24 MED ORDER — TRAMADOL HCL 50 MG PO TABS
100.0000 mg | ORAL_TABLET | Freq: Two times a day (BID) | ORAL | Status: DC | PRN
  Administered 2013-11-25 – 2013-11-28 (×4): 100 mg via ORAL
  Filled 2013-11-24 (×5): qty 2

## 2013-11-24 MED ORDER — MAGNESIUM SULFATE 40 MG/ML IJ SOLN
2.0000 g | Freq: Once | INTRAMUSCULAR | Status: AC
  Administered 2013-11-24: 2 g via INTRAVENOUS
  Filled 2013-11-24: qty 50

## 2013-11-24 MED ORDER — OXYCODONE HCL 5 MG PO TABS
5.0000 mg | ORAL_TABLET | ORAL | Status: DC | PRN
  Administered 2013-11-24: 5 mg via ORAL
  Filled 2013-11-24: qty 1

## 2013-11-24 NOTE — Progress Notes (Signed)
:    [id number] Rosana Hoes, RN, BSN, CCM, [phone] Chart reviewed for update of needs and condition. wbc14.7,hgb-7.0,hct-21.0,platlets-23, 1 unit of prbc given, temp 100.6,ecg rate 111,resp-20, a.line in place.

## 2013-11-24 NOTE — Progress Notes (Addendum)
[facility] PROGRESS NOTE  Lacey [name] ZOX:[id number] DOB: 1956/05/27 DOA: 11/02/2013 PCP: Vena Austria, MD  Assessment/Plan  CV: Septic v. Hypovolemic shock v. Adrenal crisis.   Tele:  HR 100 bpm, resolving -   Appreciate PCCM assistance -   Continue stress dose steroids -   Tx for sepsis as below  -   Need to d/c PICC, but will need alternative access for antibiotics  Chronic diastolic heart failure.  Wt down 5kg in last 3 days.  Preserved EF and grade 1 diastolic heart failure on echocardiogram from a few days ago. -  Judicious use of IVF  Adrenal Insufficiency; Adrenal infarct / hemorrhage; Continue with Hydrocortisone. Change to oral 2-20.  -  stress dose steroids  ID:  Recurrent fever, unclear etiology, treating broadly.  CT chest on 2/23:  Persistent consolidation RML with new peribronchovascular opacities suggestive of bronchopneumonia.  Underwent bronch on 2/26 -  Appreciate ID and Pulmonlogy assistance -  Repeat BCx NGTD -  BAL:  PCP neg, Bacterial, fungal, and AFB cultures pending -  UA:  Neg -  Continue vanc and cefepime -  Continue voriconazole -  C. Diff PCR neg  HCAP with partial loculation of the right pleural effusion, s/p 14 days of broad spectrum abx before fevers recurred.  Flu and aspergillus neg.  Mouth Ulcer; Herpes virus culture no herpes detected.  Acyclovir discontinued.  Soft tissue stranding in the anterior mediastinum and left thigh is of uncertain etiology and significance. If there been recent attempted central line placement, this could be iatrogenic. Less likely, this might be infectious or inflammatory, and clinical correlation for signs and symptoms of mediastinitis may be warranted  -  Unclear etiology, but not complaining of chest pain  Skin eruptions, resolved.  Unclear etiology   RENAL: Acute Renal Failure likely due to sepsis, dehydration and resolving -  FENa not obtained -  RUS wnl -  Pharmacy assisting with  renally dosing medications -   Minimize nephrotoxins  NEURO Lethargy, resolved.  May have been lingering effects of gabapentin and narcotics in setting of AKI -  CT head to rule out spontaneous hemorrhage given thrombocytopenia -  Ammonia level:  19 -  Continue to minimize sedating medications  HEME Spontaneous retroperitoneal bleed in setting of thrombocytopenia -  Goal hgb > 7 and goal plt near 50K -  INR 1.27  Mixed acute blood loss anemia plus marrow failure in setting of Hodgkin Lymphoma -  Appreciate Dr Jonette Eva assistance.  -  LDH trending down -  Bone Marrow Biopsy: HYPERCELLULAR BONE MARROW FOR AGE WITH DYSPOIETIC CHANGES. -  Needing frequent blood and plt transfusions at this point  Thrombocytopenia:  No schistocytes to suggest DIC or TTP -  Transfuse 2 units plts  -  Check post-transfusion plt and if still low will stop transfusing -  Defer to hematology  Right upper extremity superficial thrombosisSwelling decreasing.  Increased LUE swelling -  Lovenox on hold due to new left hemorrhage in the left iliacus/iliopsoas muscles.  -  Duplex LUE  FEN: Hypokalemia and hypomagnesemia -  Oral potassium repletion -  Magnesium sulfate IV 2gm  GI:  Mild elevation in LFTs may have been due to sepsis and trending down  Diet:  regular Access:  PICC IVF:  on Proph:  Hold for thrombocytopenia, spont retroperitoneal bleed  Code Status: full Family Communication: patient alone Disposition Plan:  I am concerned about her overall prognosis.  If her infectious problems resolve, her course is  complicated by marrow failure requiring frequent transfusion.  The next step would be bone marrow transplant if she is deemed a candidate.  Defer to hematology/oncology.  Currently not medically stable for transplant.  Continue goals of care conversation per Oncology.   Consultants:  Dr. Burney Gauze (oncology)  Dr Gibson Ramp (Surgery) curbside consult  Dr. Carlyle Basques (infectious  disease)  Procedures:  11/12/2013 right upper extremity Doppler  No evidence of deep vein thrombosis involving the right upper extremity, left subclavian vein, and left internal jugular vein. There is superficial thrombosis noted in the right cephalic vein.  -3/55 Bone marrow biopsy report pending  Echocardiogram 01/10/2013  - Left ventricle: mild focal basal hypertrophy of the septum.  -LVEF= ejection fraction was 55%. Wall motion was normal; there were no regional wall motion abnormalities.  -(grade 1 diastolic dysfunction).  - Aortic valve: There was no stenosis. Trivial regurgitation. - Mitral valve: Trivial regurgitation. - Left atrium: The atrium was mildly dilated. - Right ventricle: The cavity size was normal. Systolic function was normal. - Tricuspid valve: Peak RV-RA gradient: 84m Hg (S). - Pulmonary arteries: PA peak pressure: 257mHg (S). - Inferior vena cava: The vessel was normal in size; the respirophasic diameter changes were in the normal range (=50%); findings are consistent with normal central venous pressure.  Antibiotics:  Aztreonam 2/11>> stopped 2/13  Ciprofloxacin 2/11>> stopped 2/15  Vancomycin 2/11>> stopped 2/15  Primaxin>> 2/16>> 2/21 Voriconazole 2/16>> stopped 2/18, restarted 2/26 Vancomycin 2/17>> 2/24, restarted 2/26 Cefepime 2/25 >>  HPI/Subjective:  Patient much more alert.  Denies nausea.  Persistent pain LLQ and left hip.    Objective: Filed Vitals:   11/24/13 0944 11/24/13 1011 11/24/13 1027 11/24/13 1121  BP:      Pulse:      Temp: 98.4 F (36.9 C) 98.4 F (36.9 C) 98.4 F (36.9 C)   TempSrc:  Core (Comment) Core (Comment)   Resp: _0 Height:      Weight:      SpO2: 90% 96% 99% 97%    Intake/Output Summary (Last 24 hours) at 11/24/13 1123 Last data filed at 11/24/13 1027  Gross per 24 hour  Intake   1906 ml  Output   1675 ml  Net    231 ml   Filed Weights   11/19/13 0528 11/20/13 0522 11/22/13 0500  Weight:  97.977 kg (216 lb) 96.435 kg (212 lb 9.6 oz) 92.9 kg (204 lb 12.9 oz)    Exam:   General:  CF, No acute distress  HEENT:  NCAT, MMM  Cardiovascular:  RRR, nl S1, S2 no mrg, 2+ pulses, warm extremities  Respiratory:  CTAB, no increased WOB  Abdomen:   NABS, soft, mildly distended, mild TTP particularly in LLQ without rebound or guarding  MSK:   Normal tone and bulk, 2+ RUE edema, 1+ LUE, and bilateral LE edema pitting.  PICC left arm  Neuro:  Grossly intact  Psych:  Alert and cooperative  Data Reviewed: Basic Metabolic Panel:  Recent Labs Lab 11/20/13 0515 11/21/13 0330 11/22/13 0338 11/22/13 1815 11/09/2013 0320 11/24/13 0530  NA 144 140 142 142 143 144  K 3.1* 3.4* 3.2* 3.7 3.7 3.1*  CL 103 99 99 102 103 105  CO2 32 31 33* _1 GLUCOSE 133* 163* 109* 84 109* 172*  BUN _2 29* 31* 32*  CREATININE 0.99 1.00 1.55* 2.31* 2.60* 2.06*  CALCIUM 7.7* 7.9* 7.9* 7.7* 7.6* 7.5*  MG 1.5  1.5 1.5  --  1.2* 1.7   Liver Function Tests:  Recent Labs Lab 11/20/13 0515 11/21/13 0330 11/22/13 0338 11/09/2013 0320 11/24/13 0530  AST _0 105* 55*  ALT _1 39* 40*  ALKPHOS 63 71 57 65 61  BILITOT 1.2 1.1 0.9 1.1 0.4  PROT 5.5* 5.8* 5.4* 5.4* 5.5*  ALBUMIN 2.2* 2.3* 2.1* 2.0* 1.8*   No results found for this basename: LIPASE, AMYLASE,  in the last 168 hours  Recent Labs Lab 11/22/13 1815  AMMONIA 19   CBC:  Recent Labs Lab 11/21/13 0330 11/22/13 0338 11/22/13 1815 11/22/13 2356 11/04/2013 0320 11/24/13 0530 11/24/13 0840  WBC 12.6* 13.5* 14.2* 15.0* 19.7* 14.7*  --   NEUTROABS 8.1* 9.3*  --  12.0* 15.5* 10.6*  --   HGB 7.5* 6.3* 8.2* 7.6* 7.5* 7.0*  --   HCT 22.8* 19.7* 25.7* 23.4* 22.9* 21.0*  --   MCV 92.3 94.3 88.6 88.6 88.4 86.8  --   PLT 32* 32* PLATELET CLUMPS NOTED ON SMEAR, COUNT APPEARS DECREASED PLATELET CLUMPS NOTED ON SMEAR, COUNT APPEARS DECREASED 36* 23* 22*   Cardiac Enzymes:  Recent Labs Lab 11/18/13 0917 11/22/13 1815   CKTOTAL 56  --   CKMB 1.0  --   TROPONINI <0.30 <0.30   BNP (last 3 results)  Recent Labs  11/14/13 0435 11/17/13 0800 11/20/13 0515  PROBNP 2977.0* 9541.0* 3178.0*   CBG: No results found for this basename: GLUCAP,  in the last 168 hours  Recent Results (from the past 240 hour(s))  CULTURE, EXPECTORATED SPUTUM-ASSESSMENT     Status: None   Collection Time    11/14/13  4:36 PM      Result Value Ref Range Status   Specimen Description SPUTUM   Final   Special Requests Immunocompromised   Final   Sputum evaluation     Final   Value: THIS SPECIMEN IS ACCEPTABLE. RESPIRATORY CULTURE REPORT TO FOLLOW.   Report Status 11/14/2013 FINAL   Final  CULTURE, RESPIRATORY (NON-EXPECTORATED)     Status: None   Collection Time    11/14/13  4:36 PM      Result Value Ref Range Status   Specimen Description SPUTUM   Final   Special Requests NONE   Final   Gram Stain     Final   Value: RARE WBC PRESENT, PREDOMINANTLY PMN     RARE SQUAMOUS EPITHELIAL CELLS PRESENT     NO ORGANISMS SEEN     Performed at [facility]   Culture     Final   Value: NORMAL OROPHARYNGEAL FLORA     Performed at [facility]   Report Status 11/17/2013 FINAL   Final  HERPES SIMPLEX VIRUS CULTURE     Status: None   Collection Time    11/14/13  6:49 PM      Result Value Ref Range Status   Specimen Description MOUTH   Final   Special Requests Immunocompromised   Final   Culture     Final   Value: No Herpes Simplex Virus detected.     Performed at [facility]   Report Status 11/16/2013 FINAL   Final  VIRAL CULTURE VIRC     Status: None   Collection Time    11/22/13 11:46 AM      Result Value Ref Range Status   Specimen Description ULCER MOUTH   Final   Special Requests Immunocompromised   Final   Culture  Final   Value: Culture has been initiated.     Note:    The current Enterovirus culture system does not detect Enterovirus D68. If Enterovirus D68 is suspected, please call  client services to add the test for Enterovirus PCR (Test code [phone]).     Performed at [facility]   Report Status PENDING   Incomplete  CLOSTRIDIUM DIFFICILE BY PCR     Status: None   Collection Time    11/22/13  7:34 PM      Result Value Ref Range Status   C difficile by pcr NEGATIVE  NEGATIVE Final   Comment: Performed at [place]     Status: None   Collection Time    11/09/2013  2:54 PM      Result Value Ref Range Status   Specimen Description BRONCHIAL ALVEOLAR LAVAGE   Final   Special Requests Immunocompromised   Final   Gram Stain     Final   Value: FEW WBC PRESENT, PREDOMINANTLY PMN     NO SQUAMOUS EPITHELIAL CELLS SEEN     NO ORGANISMS SEEN     [facility]   Incomplete   Culture PENDING   Incomplete   Report Status PENDING   Incomplete  FUNGUS CULTURE W SMEAR     Status: None   Collection Time    11/04/2013  2:54 PM      Result Value Ref Range Status   Specimen Description BRONCHIAL WASHINGS   Final   Special Requests Immunocompromised   Final   Fungal Smear     Final   Value: NO YEAST OR FUNGAL ELEMENTS SEEN     Performed at [facility]   Culture     Final   Value: CULTURE IN PROGRESS FOR FOUR WEEKS     Performed at [facility]   Report Status PENDING   Incomplete  PNEUMOCYSTIS JIROVECI SMEAR BY DFA     Status: None   Collection Time    10/29/2013  2:54 PM      Result Value Ref Range Status   Specimen Source-[facility] BRONCHIAL ALVEOLAR LAVAGE   Final   Pneumocystis jiroveci Ag NEGATIVE   Final   Comment: Performed at [facility] [place]     Studies: Ct Head Wo Contrast  11/22/2013   CLINICAL DATA:  Increased somnolence, thrombocytopenia  EXAM: CT HEAD WITHOUT CONTRAST  TECHNIQUE: Contiguous axial images were obtained from the base of the skull through the vertex without intravenous contrast.  COMPARISON:  CT MAXILLOFACIAL W/CM dated 11/11/2013; CT  BIOPSY dated 11/09/2013  FINDINGS: Study is mildly limited to moderately limited by motion artifact. Mild age-related atrophy. No abnormal attenuation to suggest hemorrhage or infarct. No extra-axial fluid. No evidence of mass or hydrocephalus. There is a small air-fluid level in the left sphenoid sinus. Overall, sinuses are better aerated than they were on the prior study of 11/11/2013.  IMPRESSION: Persistent sphenoid sinusitis on the left. Improved aeration in the visualized paranasal sinuses otherwise. No acute intracranial abnormalities. Study somewhat limited as described above.   Electronically Signed   By: Skipper Cliche M.D.   On: 11/22/2013 15:38   US Renal  11/01/2013   CLINICAL DATA:  Acute renal failure.  EXAM: RENAL/URINARY TRACT ULTRASOUND COMPLETE  COMPARISON:  11/21/2013 CT.  FINDINGS: Right Kidney:  Length: 11.4 cm.  No mass or hydronephrosis visualized.  Left Kidney:  Length: 10.8  cm. No hydronephrosis. Lobulated contour. Evaluation slightly limited by patient's inability to hold breath. No obvious mass identified.  Bladder:  Foley catheter in place in partially decompressed.  Right-sided pleural effusion.  IMPRESSION: No hydronephrosis.  Right-sided pleural effusion.  Please see above.   Electronically Signed   By: Chauncey Cruel M.D.   On: 11/15/2013 11:51   Dg Chest Port 1 View  11/24/2013   CLINICAL DATA:  Respiratory failure  EXAM: PORTABLE CHEST - 1 VIEW  COMPARISON:  DG CHEST 2 VIEW dated 11/22/2013; DG CHEST 2 VIEW dated 11/18/2013; CT CHEST W/O CM dated 11/20/2013; CT ANGIO CHEST W/CM &/OR WO/CM dated 11/06/2013; CT BIOPSY dated 11/09/2013  FINDINGS: There is obscuration of the right heart border secondary to wedge-shaped heterogeneous/consolidative opacities involving the right middle and lower lobes. Grossly unchanged right suprahilar nodular heterogeneous opacities as demonstrated on recently performed chest CT. Pulmonary venous congestion without definite evidence of edema. Trace  bilateral effusions are not excluded. No pneumothorax. Stable position of support apparatus. Grossly unchanged bones including sequela of lower cervical ACDF. Surgical clips overlie the left supraclavicular fossa.  IMPRESSION: 1. Worsening right middle and lower lobe atelectasis/collapse. 2. Grossly unchanged right suprahilar nodular heterogeneous opacities worrisome for infection as demonstrated recently performed chest CT. A follow-up chest radiograph in 4 to 6 weeks after treatment is recommended to ensure resolution.   Electronically Signed   By: Sandi Mariscal M.D.   On: 11/24/2013 07:26    Scheduled Meds: . antiseptic oral rinse  15 mL Mouth Rinse q12n4p  . ceFEPime (MAXIPIME) IV  1 g Intravenous Q24H  . chlorhexidine  15 mL Mouth Rinse BID  . DULoxetine  60 mg Oral QPM  . feeding supplement (ENSURE COMPLETE)  237 mL Oral TID WC  . fluticasone  2 spray Each Nare Daily  . hydrocortisone sod succinate (SOLU-CORTEF) inj  50 mg Intravenous Q6H  . lidocaine  1 patch Transdermal Q24H  . magic mouthwash w/lidocaine  10 mL Oral Q4H  . magnesium sulfate 1 - 4 g bolus IVPB  2 g Intravenous Once  . midazolam  2 mg Intravenous Once  . pantoprazole  40 mg Oral Daily  . potassium chloride  40 mEq Oral Once  . sodium chloride  10-40 mL Intracatheter Q12H  . sodium chloride  3 mL Intravenous Q12H  . vancomycin  1,250 mg Intravenous Q24H  . voriconazole  200 mg Oral Q12H   Continuous Infusions: . dextrose 5 % and 0.45 % NaCl with KCl 10 mEq/L 50 mL/hr at 11/05/2013 1237  . phenylephrine (NEO-SYNEPHRINE) Adult infusion Stopped (11/24/2013 2100)    Principal Problem:   Septic shock Active Problems:   Hodgkin lymphoma   Myelodysplasia   Adrenal abnormality   Rash and nonspecific skin eruption   DOE (dyspnea on exertion)   Symptomatic anemia   Melena   Adrenal infarction   Hypotension, unspecified   PNA (pneumonia)   Sepsis   Acute renal failure   Pulmonary infiltrate   Fever    Time spent:  30 min    Lacey [name], [facility] Pager [phone]. If 7PM-7AM, please contact night-coverage at [email or web address], password [facility] 11/24/2013, 11:23 AM  LOS: 17 days

## 2013-11-24 NOTE — Evaluation (Signed)
Occupational Therapy Evaluation Patient Details Name: Lacey [name] MRN: [id number] DOB: 22-Oct-1955 Today's Date: 11/24/2013 Time: 5284-1324 OT Time Calculation (min): 17 min  OT Assessment / Plan / Recommendation History of present illness 58 y.o. WF PMHx Hodgkin's lymphoma status post chemotherapy in the pas has myelodysplastic syndrome. The patient is coming from home. Patient presents with complaints of nausea vomiting and right-sided abdominal pain. The patient mentions that her symptoms started a month ago with cough and yellowish expectoration for which she completed a course of amoxicillin despite which her cough was not getting better.since last one month she started having dyspnea on exertion which is progressively worsening.denies any orthopnea or PND.   Spontaneous retroperitoneal bleed, now with L quad weakness.   Clinical Impression   Pt was admitted for the above.  She will benefit from skilled OT to increase safety and independence with adls.  Goals in acute will focus on mobility related to adls/bathroom transfers to increase safety with these tasks.      OT Assessment  Patient needs continued OT Services    Follow Up Recommendations  CIR    Barriers to Discharge      Equipment Recommendations  3 in 1 bedside comode (further assess for drop arm)    Recommendations for Other Services    Frequency  Min 2X/week    Precautions / Restrictions Precautions Precautions: Fall Precaution Comments: L quads  are profoundly weak since retroperitoneal bleed. L LEG WILL BUCKLE Restrictions Weight Bearing Restrictions: No   Pertinent Vitals/Pain LLE sore with movement  repositioned    ADL  Grooming: Set up Where Assessed - Grooming: Supported sitting Upper Body Bathing: Set up Where Assessed - Upper Body Bathing: Supported sit to stand Lower Body Bathing: +2 Total assistance Lower Body Bathing: Patient Percentage: 20% Upper Body Dressing: Minimal assistance  (lines) Where Assessed - Upper Body Dressing: Supported sitting Lower Body Dressing: +2 Total assistance Lower Body Dressing: Patient Percentage: 0% Transfers/Ambulation Related to ADLs: pt was up in chair with lift pad under her:  did not stand due to quad weakness with PT ADL Comments: educated on AE and adl assessed only from sitting level--educated that we will likely have to perform at bed level initially.  All of this weakness is new for her.  Also educated that we will work on sit to stand but we have other methods of movement we can try.  A mechanical lift was used for OOB by nursing.  Gave orange theraband for shoulder strengthening as she will have to compensate with UEs for LLE weakness.  Will spot check progress:  handouts given Briefly educated on energy conservation   OT Diagnosis: Generalized weakness  OT Problem List: Decreased strength;Decreased activity tolerance;Impaired balance (sitting and/or standing);Decreased knowledge of use of DME or AE;Pain OT Treatment Interventions: Self-care/ADL training;Energy conservation;DME and/or AE instruction;Therapeutic activities;Patient/family education;Balance training   OT Goals(Current goals can be found in the care plan section) Acute Rehab OT Goals Patient Stated Goal: be able to get up to use the bathroom OT Goal Formulation: With patient Time For Goal Achievement: 12/08/13 Potential to Achieve Goals: Good ADL Goals Pt Will Transfer to Toilet: squat pivot transfer;with transfer board;with +2 assist;bedside commode (drop arm, pt 50%) Additional ADL Goal #1: Pt will roll to bil sides with bedrails and min A for LB adls Additional ADL Goal #2: pt will sit unsupported eob for light adl activity with min guard x 5 minutes  Visit Information  Last OT Received On: 11/24/13 Assistance  Needed: +2 History of Present Illness: 57 y.o. WF PMHx Hodgkin's lymphoma status post chemotherapy in the pas has myelodysplastic syndrome. The patient is  coming from home. Patient presents with complaints of nausea vomiting and right-sided abdominal pain. The patient mentions that her symptoms started a month ago with cough and yellowish expectoration for which she completed a course of amoxicillin despite which her cough was not getting better.since last one month she started having dyspnea on exertion which is progressively worsening.denies any orthopnea or PND.   Spontaneous retroperitoneal bleed, now with L quad weakness.       Prior [place] expects to be discharged to:: Private residence Living Arrangements: Spouse/significant other;Parent Available Help at Discharge: Family Prior Function Level of Independence: Independent Communication Communication: No difficulties         Vision/Perception     Solicitor Arousal/Alertness: Awake/alert Behavior During Therapy: WFL for tasks assessed/performed Overall Cognitive Status: Within Functional Limits for tasks assessed    Extremity/Trunk Assessment Upper Extremity Assessment Upper Extremity Assessment: Overall WFL for tasks assessed (strength grossly 4 to 4+/5)  Bil edema present  Lower Extremity Assessment per PT Lower Extremity Assessment: LLE deficits/detail LLE Deficits / Details: dorsiflexion 3+, knee extension 1, hip flexion 1+, knee flexion 3 LLE Sensation: decreased light touch (lateral thigh)   PER PT:  Mobility Bed Mobility Overal bed mobility: Needs Assistance;+2 for physical assistance;+ 2 for safety/equipment Bed Mobility: Supine to Sit Supine to sit: Max assist;+2 for physical assistance;+2 for safety/equipment General bed mobility comments: REQUIRED ASSISTANCE FOR l LEG  TO EDGE, SUPPORT  TRUNK TO SITTING UPRIGHT. Transfers Overall transfer level: Needs assistance Equipment used: None (sara plus) Transfers: Sit to/from Stand Sit to Stand: +2 physical assistance;+2 safety/equipment;Total assist General transfer  comment: unable to stand from bed due to L quad weakness. Clarise Cruz plus used to stand and move pt from bed to [facility] then to recliner., L knee will flex with any attempts to move the leg due to quad weakness, While standing inside sara plus, L foot  almost moved off of back of  standing foot plate/.     Exercise UE exercises for shoulder flexion,extension and horizontal abduction with orange band.  Written hep provided  PER PT  Balance Balance Overall balance assessment: Needs assistance Sitting-balance support: Bilateral upper extremity supported;Feet supported Sitting balance-Leahy Scale: Poor Sitting balance - Comments: unable to lean forward  securely   End of Session OT - End of Session Activity Tolerance: Patient tolerated treatment well Patient left: in chair;with call bell/phone within reach  [place] 11/24/2013, 3:36 PM [name], OTR/L [phone] 11/24/2013

## 2013-11-24 NOTE — Progress Notes (Signed)
BP cuff pressure is 5 to 7 points lower than arterial line pressure.  Removed arterial line per MD order.

## 2013-11-24 NOTE — Progress Notes (Signed)
PULMONARY/CCM NOTE  Requesting MD/Service: [facility]/[place] Date of admission: 2/10 Date of consult: 2/25 Reason for consultation: Abnormal CXR/CT chest. Consider FOB  Pt Profile: 38 F with hx of Hodgkin's lymphoma and MDS admitted 2/10 with fever, N/V and abdominal pain. Her admitting dx was pyelonephritis. Her hospitalization has been lengthy and complicated. She has had intermittent low grade fevers, leukocytosis and persistent R suprahilar density on CXR. A CT chest 2/23 is discussed below and the CT findings have prompted PCCM consult to consider FOB.   SUBJ: Looks and feels better. No new complaints  Filed Vitals:   11/24/13 1121 11/24/13 1209 11/24/13 1230 11/24/13 1238  BP:    128/67  Pulse:      Temp: 98.3 F (36.8 C) 98.6 F (37 C) 97.7 F (36.5 C)   TempSrc: Oral Axillary Oral   Resp: 19 21 26 22   Height:      Weight:      SpO2: 97% 94% 96% 96%    EXAM:  Gen:  NAD HEENT: WNL, PERRL, EOMI Neck: no JVD Lungs: clear anteriorly without wheezes Cardiovascular: RRR s M Abdomen: obese, soft, NT, +BS Warm: no C/C/E Neuro: no focal deficit  DATA:  I have reviewed all of today's lab results. Relevant abnormalities are discussed in the A/P section  CXR: Increased RLL ATX. [place] R suprahilar dnesity    IMPRESSION:   H/O lymphoma and MDS Intermittent fever, leukocytosis. Markedly elevated PCT Persistent RML density - atelectasis and/or infiltrate. Hypotension - resolved  Suspect adrenal insuff (adrenal hemorrhage noted on CT abd)  Possible component of SIRS  NIBP inaccuracy (MAP on A-line approx 10-15 mmHg higher) Acute on chronic renal insufficiency - Cr improving Anemia Thrombocytopenia   PLAN/REC:  OK for transfer out of ICU/SDU DC A-line Would taper steroids to off slowly - over 3-4 wks Once off steroids, would perform formal cosyntropin stim test to assess adrenal function Keep in mind that NIBP runs lower than A-line - Keep MAP > 55 mmHg by NIBP Rest of  mgmt per [facility], Hem/Onc, ID services  PCCM will sign off. Please call if we can be of further assistance  Family and pt updated @ bedside from Sultan, MD ; [facility] service Mobile [phone].  After 5:30 PM or weekends, call [phone]

## 2013-11-24 NOTE — Progress Notes (Signed)
I Mrs. Withington is doing okay. She had her bronchoscopy yesterday. We'll have to await results.  No more temperature spikes. She continues on broad-spectrum antibiotic coverage. She is on Maxipime, vancomycin, voriconazole. Again, results from the bronchoscopy are pending.  She is off pressor support right now. Blood pressure is holding steady.  Her platelet count is dropping. Today it is down to 23. The problem that we have is that she is immunized to platelets. I will try HLA matched platelets. Hopefully these will give [place] a better "bump". I will also consider Amicar.  She has a normal IgG level.  There is no bleeding that we can tell. Still has pain in the left lower quadrant. She's had bleeding in this area. With her low platelets it may make resolution of his bleeding more difficult. I think we should do a noncontrast CT scan to see what the area of bleeding is like.  I appreciate everybody's help.  Her renal function is improving. She still has a good urine output.  It has been hard to do any type of therapy on her to get her stronger. This will be a big issue once he comes time for discharge.  I got the cytogenetic report on her bone marrow. Unfortunately, she continues to have additional chromosomal changes. I think this all is very indicative of her bone marrow dysfunction.  I think it is a good idea that if she does have a temperature spike to remove the PICC line.  On her exam, there is no change from yesterday. She still has ulcerations in her mouth. Her lungs sound clear. She has good breath sounds bilaterally. Cardiac exam is slightly tachycardic but regular. Abdomen is somewhat obese. There is some tenderness in the left lower quadrant. No obvious masses noted. Extremities shows continued edema in the right arm. It doesn't appear to be as bad. She has some edema in her legs. She has decent strength in the left leg.  This is still a very complicated situation. With her platelet  count going down, this further complicates things.  We will continue to follow and try to help out as much as possible.   [name],  Psalm 55:22

## 2013-11-24 NOTE — Progress Notes (Addendum)
CSW continuing to follow.  Pt had chosen [facility] prior to pt transfer down to ICU.  CSW contacted [facility] to update facility. Per [facility] does not need to send updated clinicals until pt nearing discharge as pt insurance company will want most recent clinicals for authorization. Pt insurance requires authorization from insurance prior to pt transition to SNF.  CSW to continue to follow to assist with pt disposition planning.  Addendum 4:00pm:  CSW noted PT/OT recommendation is now for CIR. CSW received phone call from [facility] rehab admissions coordinator who inquired about documented plans for [facility]. CSW discussed that pt has been in the hospital for a lengthy amount of time and SNF was initial recommendation, but PT evaluations now indicating that will benefit from CIR. Per rehab admission coordinator, Inpatient Rehab Consult will be recommended. Inpatient Rehab Admissions coordinator discussed need for OT evaluation and CSW confirmed that OT had evaluated pt today and agreed with recommendation for CIR consult.  CSW will continue to follow as SNF will need to remain a secondary option in the instance that pt not appropriate for CIR or pt insurance does not authorization CIR.  CSW to continue to follow.  Alison Murray, MSW, [place] [employer] [phone]

## 2013-11-24 NOTE — Progress Notes (Signed)
ANTIBIOTIC CONSULT NOTE - follow up  Pharmacy Consult for vancomycin, cefepime, voriconazole Indication: rule out pneumonia, fever, immunocompromised status, ?sinus infection  Allergies  Allergen Reactions  . Penicillins Hives and Rash  . Contrast Media [Iodinated Diagnostic Agents]     Patient Measurements: Height: 5' (152.4 cm) Weight: 204 lb 12.9 oz (92.9 kg) (hoyar lift) IBW/kg (Calculated) : 45.5 Adjusted Body Weight: 64.5kg  Vital Signs: Temp: 98.4 F (36.9 C) (02/27 1027) Temp src: Core (Comment) (02/27 1027) Intake/Output from previous day: 02/26 0701 - 02/27 0700 In: 1545 [I.V.:1495; IV Piggyback:50] Out: 2005 [Urine:2005] Intake/Output from this shift: Total I/O In: 812.5 [I.V.:400; Blood:162.5; IV Piggyback:250] Out: 250 [Urine:250]  Labs:  Recent Labs  11/22/13 0338 11/22/13 1142 11/22/13 1815 11/22/13 2356 11/01/2013 0320 11/24/13 0530 11/24/13 0840  WBC 13.5*  --  14.2* 15.0* 19.7* 14.7*  --   HGB 6.3*  --  8.2* 7.6* 7.5* 7.0*  --   PLT 32*  --  PLATELET CLUMPS NOTED ON SMEAR, COUNT APPEARS DECREASED PLATELET CLUMPS NOTED ON SMEAR, COUNT APPEARS DECREASED 36* 23* 22*  LABCREA  --  130.1  --   --   --   --   --   CREATININE 1.55*  --  2.31*  --  2.60* 2.06*  --    Estimated Creatinine Clearance: 29.9 ml/min (by C-G formula based on Cr of 2.06).   Medical History: Past Medical History  Diagnosis Date  . Cancer   . Cough productive of clear sputum 01/04/2013  . Fibromyalgia     Medications:  Scheduled:  . antiseptic oral rinse  15 mL Mouth Rinse q12n4p  . ceFEPime (MAXIPIME) IV  1 g Intravenous Q24H  . chlorhexidine  15 mL Mouth Rinse BID  . DULoxetine  60 mg Oral QPM  . feeding supplement (ENSURE COMPLETE)  237 mL Oral TID WC  . fluticasone  2 spray Each Nare Daily  . hydrocortisone sod succinate (SOLU-CORTEF) inj  50 mg Intravenous Q6H  . lidocaine  1 patch Transdermal Q24H  . magic mouthwash w/lidocaine  10 mL Oral Q4H  . magnesium  sulfate 1 - 4 g bolus IVPB  2 g Intravenous Once  . midazolam  2 mg Intravenous Once  . pantoprazole  40 mg Oral Daily  . potassium chloride  40 mEq Oral Once  . sodium chloride  10-40 mL Intracatheter Q12H  . sodium chloride  3 mL Intravenous Q12H  . vancomycin  1,250 mg Intravenous Q24H  . voriconazole  200 mg Oral Q12H   Infusions:  . dextrose 5 % and 0.45 % NaCl with KCl 10 mEq/L 50 mL/hr at 11/15/2013 1237  . phenylephrine (NEO-SYNEPHRINE) Adult infusion Stopped (11/18/2013 2100)   Assessment: 21 yof with h/o Hodgkin's lymphoma, treatment related myelodysplasia admitted 2/10 with UTI/pyelonephritis and bronchitis. Transferred to ICU overnight 2/25 due to hypotension, now in renal failure possibly from IVP dye. Has been on multiple anti-infectives as listed below. Oncology changed fluconazole to voriconazole 2/26 per Pharmacy, Pharmacy also dosing cefepime and vancomycin for r/o HCAP.  Antiinfectives 2/10 > cipro > 2/13 2/11 > aztreo > 2/13 2/11 > vanc > 2/15, **restart 2/17 >> 2/25 2/16 > primaxin > 2/21 2/16 > voriconazole > 2/18 2/17 > acyclovir > 2/20 2/21 > acyclovir > 2/22 2/21 > fluconazole > 2/26 2/25 > cefepime > 2/25 > restart vancomycin > 2/26 > voriconazole >  Tmax: 100.6 WBCs: down to 14.7 (myelodysplastic and myeloproliferative d/o, steroids) Renal: ARF, SCr improved to 2.06, CrCl  29 CG, UOP improved to 0.9/kg/hr PCT > 175, lactic acid 1.2 (2/18), PCT 51 (2/27)  Microbiology 2/11 blood x2: NGF 2/11 flu panel: neg 2/14 resp virus panel: neg 2/16 blood x 2: NGF 2/16 urine: NGF 2/17 Sputum: nml flora 2/17 HSV (mouth): neg 2/17 Aspergillus galactomannan Ag: Not Detected  2/18 fungitell (beta D glucan) test: neg 2/25 viral Cx: pend 2/25 blood x1: sent 2/25 C.diff: neg 2/26 BAL: sent  Dose changes/drug level info:  2/13 VT at 1700 = 20.3 on 750mg  q12h, decrease to 500mg  IV q12h 2/20 VT = 17 on 1250mg  Q24h, no change-note written 2/26 VT 11.3 after 1250mg   (LD 2/24 0955), redose with 1250mg  x1 this am, recheck VT 2/27  2/27 VT 15.7, 20hr post 1250mg  dose, restart 1250mg  q12h, check VT 3/1   Goal of Therapy:  Vancomycin trough level 15-20 mcg/ml eradication of infection  Plan:  - continue cefepime 1g IV q24h - restart vancomycin 1250mg  q24h - vancomycin trough 3/1 - continue voriconazole 200mg  PO q12h - follow-up clinical course, culture results, renal function - follow-up antibiotic de-escalation and length of therapy  Thank you for the consult.  Johny Drilling, PharmD, BCPS Pager: [phone] Pharmacy: [phone] 11/24/2013 10:57 AM

## 2013-11-24 NOTE — Progress Notes (Signed)
Physical Therapy Treatment Patient Details Name: Lacey [name] MRN: [id number] DOB: 07-May-1956 Today's Date: 11/24/2013 Time: 1607-3710 PT Time Calculation (min): 40 min  PT Assessment / Plan / Recommendation  History of Present Illness 58 y.o. WF PMHx Hodgkin's lymphoma status post chemotherapy in the pas has myelodysplastic syndrome. The patient is coming from home. Patient presents with complaints of nausea vomiting and right-sided abdominal pain. The patient mentions that her symptoms started a month ago with cough and yellowish expectoration for which she completed a course of amoxicillin despite which her cough was not getting better.since last one month she started having dyspnea on exertion which is progressively worsening.denies any orthopnea or PND.   Spontaneous retroperitoneal bleed, now with L quad weakness.   PT Comments   Pt now presents with profound weakness of LLE, especially quads, pt unable to safely stand today. Pt experiencing pain and numbness of L thigh. Pt will benefit from post acute rehab. Mechanical lift utilized today to safely transfer pt from the bed.  Follow Up Recommendations  CIR;Supervision/Assistance - 24 hour     Does the patient have the potential to tolerate intense rehabilitation     Barriers to Discharge        Equipment Recommendations   (tbd)    Recommendations for Other Services Rehab consult  Frequency Min 3X/week   Progress towards PT Goals Progress towards PT goals: Not progressing toward goals - comment (new onset LLE weakness)  Plan Discharge plan needs to be updated    Precautions / Restrictions Precautions Precautions: Fall Precaution Comments: L quads  are profoundly weak since retroperitoneal bleed. L LEG WILL BUCKLE   Pertinent Vitals/Pain  LEFT LEG STRENGTH Pt reports pain in groin to knee and lateral,.posterior thigh. RN aware.  Dorsiflexion 3, knee extension 1, knee flexion 3, hip flexion 1+ Decreased LT L thigh   Ant/lateral     Mobility  Bed Mobility Overal bed mobility: Needs Assistance;+2 for physical assistance;+ 2 for safety/equipment Bed Mobility: Supine to Sit Supine to sit: Max assist;+2 for physical assistance;+2 for safety/equipment General bed mobility comments: REQUIRED ASSISTANCE FOR L LEG  TO EDGE, SUPPORT  TRUNK TO SITTING UPRIGHT. Transfers Overall transfer level: Needs assistance Equipment used:  (sara plus) Transfers: Sit to/from Stand Sit to Stand: +2 physical assistance;+2 safety/equipment;Total assist General transfer comment: unable to stand from bed due to L quad weakness. Clarise Cruz plus used to stand and move pt from bed to [facility] then to recliner., L knee will flex with any attempts to move the leg due to quad weakness, While standing inside sara plus, L foot  almost moved off of back of  standing foot plate/.     Exercises General Exercises - Lower Extremity Ankle Circles/Pumps: AROM;Both;10 reps;Supine Quad Sets: AROM;10 reps;Supine (spouse instructed to guide pt.)   PT Diagnosis:    PT Problem List:   PT Treatment Interventions:     PT Goals (current goals can now be found in the care plan section)    Visit Information  Last PT Received On: 11/24/13 Assistance Needed: +2 History of Present Illness: 58 y.o. WF PMHx Hodgkin's lymphoma status post chemotherapy in the pas has myelodysplastic syndrome. The patient is coming from home. Patient presents with complaints of nausea vomiting and right-sided abdominal pain. The patient mentions that her symptoms started a month ago with cough and yellowish expectoration for which she completed a course of amoxicillin despite which her cough was not getting better.since last one month she started having dyspnea on  exertion which is progressively worsening.denies any orthopnea or PND.   Spontaneous retroperitoneal bleed, now with L quad weakness.    Subjective Data      Cognition  Cognition Arousal/Alertness: Awake/alert Behavior  During Therapy: Anxious    Balance  Balance Overall balance assessment: Needs assistance Sitting-balance support: Bilateral upper extremity supported;Feet supported Sitting balance-Leahy Scale: Poor Sitting balance - Comments: unable to lean forward  securely  End of Session PT - End of Session Activity Tolerance: Patient limited by fatigue;Patient limited by pain Patient left: in chair;with call bell/phone within reach;with family/visitor present;with nursing/sitter in room Nurse Communication: Mobility status;Need for lift equipment   GP     [name] 11/24/2013, 2:11 PM [phone]

## 2013-11-24 NOTE — Progress Notes (Signed)
Rehab Admissions Coordinator Note:  Patient was screened by Millan Legan L for appropriateness for an Inpatient Acute Rehab Consult.  At this time, we are recommending Inpatient Rehab consult. Spoke with Education officer, museum who stated that pt is making improved progress with PT and PT now feels inpatient rehab would meet pt's needs. Will need OT evaluation as well.    Rida Loudin, PT 11/24/2013, 3:34 PM  I can be reached at [phone].

## 2013-11-25 DIAGNOSIS — M629 Disorder of muscle, unspecified: Secondary | ICD-10-CM

## 2013-11-25 DIAGNOSIS — R209 Unspecified disturbances of skin sensation: Secondary | ICD-10-CM

## 2013-11-25 LAB — RETICULOCYTES
RBC.: 2.95 MIL/uL — ABNORMAL LOW (ref 3.87–5.11)
RETIC CT PCT: 0.4 % (ref 0.4–3.1)
Retic Count, Absolute: 11.8 10*3/uL — ABNORMAL LOW (ref 19.0–186.0)

## 2013-11-25 LAB — CBC WITH DIFFERENTIAL/PLATELET
Basophils Absolute: 0.3 10*3/uL — ABNORMAL HIGH (ref 0.0–0.1)
Basophils Relative: 2 % — ABNORMAL HIGH (ref 0–1)
EOS PCT: 1 % (ref 0–5)
Eosinophils Absolute: 0.2 10*3/uL (ref 0.0–0.7)
HEMATOCRIT: 25.7 % — AB (ref 36.0–46.0)
Hemoglobin: 8.3 g/dL — ABNORMAL LOW (ref 12.0–15.0)
LYMPHS ABS: 1 10*3/uL (ref 0.7–4.0)
Lymphocytes Relative: 6 % — ABNORMAL LOW (ref 12–46)
MCH: 28.1 pg (ref 26.0–34.0)
MCHC: 32.3 g/dL (ref 30.0–36.0)
MCV: 87.1 fL (ref 78.0–100.0)
MONO ABS: 1.6 10*3/uL — AB (ref 0.1–1.0)
Monocytes Relative: 10 % (ref 3–12)
NEUTROS ABS: 12.8 10*3/uL — AB (ref 1.7–7.7)
Neutrophils Relative %: 81 % — ABNORMAL HIGH (ref 43–77)
Platelets: 70 10*3/uL — ABNORMAL LOW (ref 150–400)
RBC: 2.95 MIL/uL — ABNORMAL LOW (ref 3.87–5.11)
RDW: 23.4 % — AB (ref 11.5–15.5)
WBC: 15.9 10*3/uL — ABNORMAL HIGH (ref 4.0–10.5)

## 2013-11-25 LAB — COMPREHENSIVE METABOLIC PANEL
ALK PHOS: 68 U/L (ref 39–117)
ALT: 40 U/L — AB (ref 0–35)
AST: 34 U/L (ref 0–37)
Albumin: 2.2 g/dL — ABNORMAL LOW (ref 3.5–5.2)
BUN: 32 mg/dL — AB (ref 6–23)
CO2: 27 meq/L (ref 19–32)
Calcium: 8.3 mg/dL — ABNORMAL LOW (ref 8.4–10.5)
Chloride: 105 mEq/L (ref 96–112)
Creatinine, Ser: 1.77 mg/dL — ABNORMAL HIGH (ref 0.50–1.10)
GFR calc Af Amer: 35 mL/min — ABNORMAL LOW (ref >90)
GFR calc non Af Amer: 30 mL/min — ABNORMAL LOW (ref >90)
GLUCOSE: 146 mg/dL — AB (ref 70–99)
POTASSIUM: 4.2 meq/L (ref 3.7–5.3)
Sodium: 144 mEq/L (ref 137–147)
TOTAL PROTEIN: 6.6 g/dL (ref 6.0–8.3)
Total Bilirubin: 0.4 mg/dL (ref 0.3–1.2)

## 2013-11-25 LAB — MAGNESIUM: Magnesium: 2.4 mg/dL (ref 1.5–2.5)

## 2013-11-25 MED ORDER — DEXTROSE 5 % IV SOLN
2.0000 g | INTRAVENOUS | Status: DC
  Administered 2013-11-25 – 2013-11-26 (×2): 2 g via INTRAVENOUS
  Filled 2013-11-25 (×2): qty 2

## 2013-11-25 MED ORDER — FUROSEMIDE 10 MG/ML IJ SOLN
20.0000 mg | Freq: Once | INTRAMUSCULAR | Status: AC
  Administered 2013-11-25: 20 mg via INTRAVENOUS
  Filled 2013-11-25: qty 2

## 2013-11-25 MED ORDER — DRONABINOL 2.5 MG PO CAPS
2.5000 mg | ORAL_CAPSULE | Freq: Two times a day (BID) | ORAL | Status: DC
  Administered 2013-11-25 – 2013-11-26 (×4): 2.5 mg via ORAL
  Filled 2013-11-25 (×4): qty 1

## 2013-11-25 MED ORDER — HYDROCORTISONE NA SUCCINATE PF 100 MG IJ SOLR
50.0000 mg | Freq: Two times a day (BID) | INTRAMUSCULAR | Status: DC
  Administered 2013-11-25: 50 mg via INTRAVENOUS
  Filled 2013-11-25 (×3): qty 1

## 2013-11-25 NOTE — Progress Notes (Signed)
[facility] PROGRESS NOTE  Lacey [name] FFM:[id number] DOB: Jul 26, 1956 DOA: 11/21/2013 PCP: Vena Austria, MD  Assessment/Plan  CV:  Septic v. hypovolemic shock v. adrenal crisis.   Tele:  HR 100 bpm, resolving -   Appreciate PCCM assistance -   Wean steroids to BID today -   Tx for sepsis as below  -   Need to d/c PICC   Acute on chronic diastolic heart failure.  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  CT chest on 2/23:  Persistent consolidation RML with new peribronchovascular opacities suggestive of bronchopneumonia.  Underwent bronch on 2/26 -  Appreciate ID and Pulmonlogy assistance -  Repeat BCx NGTD -  BAL:  PCP neg, Bacterial, fungal, and AFB cultures pending -  UA:  Neg -  Continue vanc and cefepime -  Continue voriconazole -  C. Diff PCR neg  HCAP with partial loculation of the right pleural effusion, s/p 14 days of broad spectrum abx.  Flu and aspergillus neg.  Mouth Ulcer; Herpes virus culture no herpes detected.  Acyclovir discontinued.  Soft tissue stranding in the anterior mediastinum and left thigh is of uncertain etiology and significance. If there been recent attempted central line placement, this could be iatrogenic. Less likely, this might be infectious or inflammatory, and clinical correlation for signs and symptoms of mediastinitis may be warranted  -  Unclear etiology, but not complaining of chest pain  Skin eruptions, resolved.  Unclear etiology   RENAL: Acute Renal Failure likely due to sepsis, dehydration and resolving, baseline cr 1 -  RUS wnl -  Pharmacy assisting with renally dosing medications -   Minimize nephrotoxins  NEURO Lethargy, resolved.  May have been lingering effects of gabapentin and  narcotics in setting of AKI -  CT head neg -  Ammonia level:  19 -  Continue to minimize sedating medications  HEME Spontaneous retroperitoneal bleed in setting of thrombocytopenia, resolving on recent CT -  Goal hgb > 7 and goal plt near 50K -  INR 1.27  Mixed acute blood loss anemia plus marrow failure in setting of Hodgkin Lymphoma -  Appreciate Dr Jonette Eva assistance.  -  Bone Marrow Biopsy: HYPERCELLULAR BONE MARROW FOR AGE WITH DYSPOIETIC CHANGES. -  May need bone marrow transplant once medically ready  Thrombocytopenia:  No schistocytes to suggest DIC or TTP, improved with plt tx -  Defer to hematology  Right upper extremity superficial thrombosis Swelling decreasing.    FEN: Hypokalemia and hypomagnesemia -  Oral potassium repletion -  Magnesium sulfate IV 2gm  GI:  Mild elevation in LFTs may have been due to sepsis and trending down  Diet:  regular Access:  PICC IVF:  on Proph:  Hold for thrombocytopenia, spont retroperitoneal bleed  Code Status: full Family Communication: patient alone Disposition Plan:  Clinically improving.  PT/OT recommending CIR.  Need plan for marrow failure, however, because she is requiring frequent blood and plt transfusions.    Consultants:  Dr. Burney Gauze (oncology)  Dr Gibson Ramp (Surgery) curbside consult  Dr. Carlyle Basques (infectious disease)  Procedures:  11/12/2013 right upper extremity Doppler  No evidence of deep vein thrombosis involving the right upper extremity, left subclavian vein, and left internal jugular vein. There is superficial  thrombosis noted in the right cephalic vein.  -1/69 Bone marrow biopsy report pending  Echocardiogram 01/10/2013  - Left ventricle: mild focal basal hypertrophy of the septum.  -LVEF= ejection fraction was 55%. Wall motion was normal; there were no regional wall motion abnormalities.  -(grade 1 diastolic dysfunction).  - Aortic valve: There was no stenosis. Trivial regurgitation. -  Mitral valve: Trivial regurgitation. - Left atrium: The atrium was mildly dilated. - Right ventricle: The cavity size was normal. Systolic function was normal. - Tricuspid valve: Peak RV-RA gradient: 77mm Hg (S). - Pulmonary arteries: PA peak pressure: 27mm Hg (S). - Inferior vena cava: The vessel was normal in size; the respirophasic diameter changes were in the normal range (=50%); findings are consistent with normal central venous pressure.  Antibiotics:  Aztreonam 2/11>> stopped 2/13  Ciprofloxacin 2/11>> stopped 2/15  Vancomycin 2/11>> stopped 2/15  Primaxin>> 2/16>> 2/21 Voriconazole 2/16>> stopped 2/18, restarted 2/26 Vancomycin 2/17>> 2/24, restarted 2/26 Cefepime 2/25 >>  HPI/Subjective:  Patient much more alert.  Denies nausea.  New wheeze today.  Persistent pain LLQ and left hip.    Objective: Filed Vitals:   11/24/13 2355 11/25/13 0400 11/25/13 0440 11/25/13 0637  BP: 137/81  154/79 106/69  Pulse:      Temp: 98.6 F (37 C) 97.7 F (36.5 C)  97.4 F (36.3 C)  TempSrc: Oral Oral  Oral  Resp: $Remo'12 18 12 16  'CdlJe$ Height:      Weight:  98.5 kg (217 lb 2.5 oz)    SpO2: 94% 95% 89% 92%    Intake/Output Summary (Last 24 hours) at 11/25/13 1209 Last data filed at 11/25/13 0500  Gross per 24 hour  Intake 1363.33 ml  Output    425 ml  Net 938.33 ml   Filed Weights   11/20/13 0522 11/22/13 0500 11/25/13 0400  Weight: 96.435 kg (212 lb 9.6 oz) 92.9 kg (204 lb 12.9 oz) 98.5 kg (217 lb 2.5 oz)    Exam:   General:  CF, No acute distress  HEENT:  NCAT, MMM  Cardiovascular:  RRR, nl S1, S2 no mrg, 2+ pulses, warm extremities  Respiratory:  Full exp wheeze throughout, no rales or rhonchi, no increased WOB  Abdomen:   NABS, soft, mildly distended, mild TTP particularly in LLQ without rebound or guarding  MSK:   Normal tone and bulk, 2+ RUE edema, 1+ LUE, and bilateral LE edema pitting.  PICC left arm  Neuro:  Grossly intact  Psych:  Alert and cooperative  Data  Reviewed: Basic Metabolic Panel:  Recent Labs Lab 11/21/13 0330 11/22/13 0338 11/22/13 1815 11/03/2013 0320 11/24/13 0530 11/25/13 0414  NA 140 142 142 143 144 144  K 3.4* 3.2* 3.7 3.7 3.1* 4.2  CL 99 99 102 103 105 105  CO2 31 33* $Remo'29 25 25 27  'CDagj$ GLUCOSE 163* 109* 84 109* 172* 146*  BUN 19 22 29* 31* 32* 32*  CREATININE 1.00 1.55* 2.31* 2.60* 2.06* 1.77*  CALCIUM 7.9* 7.9* 7.7* 7.6* 7.5* 8.3*  MG 1.5 1.5  --  1.2* 1.7 2.4   Liver Function Tests:  Recent Labs Lab 11/21/13 0330 11/22/13 0338 11/12/2013 0320 11/24/13 0530 11/25/13 0414  AST 21 26 105* 55* 34  ALT 13 15 39* 40* 40*  ALKPHOS 71 57 65 61 68  BILITOT 1.1 0.9 1.1 0.4 0.4  PROT 5.8* 5.4* 5.4* 5.5* 6.6  ALBUMIN 2.3* 2.1* 2.0* 1.8* 2.2*   No results found for this basename: LIPASE, AMYLASE,  in the  last 168 hours  Recent Labs Lab 11/22/13 1815  AMMONIA 19   CBC:  Recent Labs Lab 11/22/13 0338 11/22/13 1815 11/22/13 2356 11/06/2013 0320 11/24/13 0530 11/24/13 0840 11/25/13 0414  WBC 13.5* 14.2* 15.0* 19.7* 14.7*  --  15.9*  NEUTROABS 9.3*  --  12.0* 15.5* 10.6*  --  12.8*  HGB 6.3* 8.2* 7.6* 7.5* 7.0*  --  8.3*  HCT 19.7* 25.7* 23.4* 22.9* 21.0*  --  25.7*  MCV 94.3 88.6 88.6 88.4 86.8  --  87.1  PLT 32* PLATELET CLUMPS NOTED ON SMEAR, COUNT APPEARS DECREASED PLATELET CLUMPS NOTED ON SMEAR, COUNT APPEARS DECREASED 36* 23* 22* 70*   Cardiac Enzymes:  Recent Labs Lab 11/22/13 1815  TROPONINI <0.30   BNP (last 3 results)  Recent Labs  11/14/13 0435 11/17/13 0800 11/20/13 0515  PROBNP 2977.0* 9541.0* 3178.0*   CBG: No results found for this basename: GLUCAP,  in the last 168 hours  Recent Results (from the past 240 hour(s))  VIRAL CULTURE VIRC     Status: None   Collection Time    11/22/13 11:46 AM      Result Value Ref Range Status   Specimen Description ULCER MOUTH   Final   Special Requests Immunocompromised   Final   Culture     Final   Value: Culture has been initiated.     Note:     The current Enterovirus culture system does not detect Enterovirus D68. If Enterovirus D68 is suspected, please call client services to add the test for Enterovirus PCR (Test code 16109).     Performed at [facility]   Report Status PENDING   Incomplete  CLOSTRIDIUM DIFFICILE BY PCR     Status: None   Collection Time    11/22/13  7:34 PM      Result Value Ref Range Status   C difficile by pcr NEGATIVE  NEGATIVE Final   Comment: Performed at [facility]  CULTURE, BLOOD (SINGLE)     Status: None   Collection Time    11/22/13 11:30 PM      Result Value Ref Range Status   Specimen Description BLOOD PIC LINE   Final   Special Requests BOTTLES DRAWN AEROBIC AND ANAEROBIC   Final   Culture  Setup Time     Final   Value: 11/03/2013 04:04     Performed at [facility]   Culture     Final   Value:        BLOOD CULTURE RECEIVED NO GROWTH TO DATE CULTURE WILL BE HELD FOR 5 DAYS BEFORE ISSUING A FINAL NEGATIVE REPORT     Performed at [facility]   Report Status PENDING   Incomplete  AFB CULTURE WITH SMEAR     Status: None   Collection Time    11/04/2013  2:54 PM      Result Value Ref Range Status   Specimen Description BRONCHIAL ALVEOLAR LAVAGE   Final   Special Requests Immunocompromised   Final   ACID FAST SMEAR     Final   Value: NO ACID FAST BACILLI SEEN     Performed at [facility]   Culture     Final   Value: CULTURE WILL BE EXAMINED FOR 6 WEEKS BEFORE ISSUING A FINAL REPORT     Performed at [facility]   Report Status PENDING   Incomplete  CULTURE, BAL-QUANTITATIVE     Status: None   Collection Time  11/09/2013  2:54 PM      Result Value Ref Range Status   Specimen Description BRONCHIAL ALVEOLAR LAVAGE   Final   Special Requests Immunocompromised   Final   Gram Stain     Final   Value: FEW WBC PRESENT, PREDOMINANTLY PMN     NO SQUAMOUS EPITHELIAL CELLS SEEN     NO ORGANISMS SEEN     Performed at [facility]  Count PENDING   Incomplete   Culture PENDING   Incomplete   Report Status PENDING   Incomplete  FUNGUS CULTURE W SMEAR     Status: None   Collection Time    11/20/2013  2:54 PM      Result Value Ref Range Status   Specimen Description BRONCHIAL WASHINGS   Final   Special Requests Immunocompromised   Final   Fungal Smear     Final   Value: NO YEAST OR FUNGAL ELEMENTS SEEN     Performed at [facility]   Culture     Final   Value: CULTURE IN PROGRESS FOR FOUR WEEKS     Performed at [facility]   Report Status PENDING   Incomplete  PNEUMOCYSTIS JIROVECI SMEAR BY DFA     Status: None   Collection Time    10/31/2013  2:54 PM      Result Value Ref Range Status   Specimen Source-PJSRC BRONCHIAL ALVEOLAR LAVAGE   Final   Pneumocystis jiroveci Ag NEGATIVE   Final   Comment: Performed at [facility]     Studies: Ct Pelvis Wo Contrast  11/24/2013   CLINICAL DATA:  Cancer. Fibromyalgia. Bleeding in the left iliacus muscle.  EXAM: CT PELVIS WITHOUT CONTRAST  TECHNIQUE: Multidetector CT imaging of the pelvis was performed following the standard protocol without intravenous contrast.  COMPARISON:  CT ABD/PELVIS W CM dated 11/21/2013  FINDINGS: Left iliacus muscle 4.3 cm in thickness, previously 4.6 cm. The normal right iliacus muscle measures 1.5 cm in thickness. The underlying hematoma is isodense to the muscle. The hematoma continues to track distally in the iliacus essentially to the myotendinous junction. Mildly reduced overlying stranding along the posterior perirenal fascia.  Appendix normal. Small amount of gas is present in the urinary bladder but the Foley catheter is been removed.  Stable presacral edema.  There is facet arthropathy bilaterally at L5-S1.  IMPRESSION: 1. Evolutionary findings of the left iliacus hematoma, with mildly reduced size and with more homogeneous density similar to that of the surrounding muscle.   Electronically Signed   By: Herbie Baltimore  M.D.   On: 11/24/2013 16:16   Dg Chest Port 1 View  11/24/2013   CLINICAL DATA:  Respiratory failure  EXAM: PORTABLE CHEST - 1 VIEW  COMPARISON:  DG CHEST 2 VIEW dated 11/22/2013; DG CHEST 2 VIEW dated 11/18/2013; CT CHEST W/O CM dated 11/20/2013; CT ANGIO CHEST W/CM &/OR WO/CM dated 11/21/2013; CT BIOPSY dated 11/09/2013  FINDINGS: There is obscuration of the right heart border secondary to wedge-shaped heterogeneous/consolidative opacities involving the right middle and lower lobes. Grossly unchanged right suprahilar nodular heterogeneous opacities as demonstrated on recently performed chest CT. Pulmonary venous congestion without definite evidence of edema. Trace bilateral effusions are not excluded. No pneumothorax. Stable position of support apparatus. Grossly unchanged bones including sequela of lower cervical ACDF. Surgical clips overlie the left supraclavicular fossa.  IMPRESSION: 1. Worsening right middle and lower lobe atelectasis/collapse. 2. Grossly unchanged right suprahilar nodular  heterogeneous opacities worrisome for infection as demonstrated recently performed chest CT. A follow-up chest radiograph in 4 to 6 weeks after treatment is recommended to ensure resolution.   Electronically Signed   By: Sandi Mariscal M.D.   On: 11/24/2013 07:26    Scheduled Meds: . antiseptic oral rinse  15 mL Mouth Rinse q12n4p  . ceFEPime (MAXIPIME) IV  1 g Intravenous Q24H  . chlorhexidine  15 mL Mouth Rinse BID  . dronabinol  2.5 mg Oral BID AC  . DULoxetine  60 mg Oral QPM  . feeding supplement (ENSURE COMPLETE)  237 mL Oral TID WC  . fluticasone  2 spray Each Nare Daily  . furosemide  20 mg Intravenous Once  . hydrocortisone sod succinate (SOLU-CORTEF) inj  50 mg Intravenous BID  . lidocaine  1 patch Transdermal Q24H  . magic mouthwash w/lidocaine  10 mL Oral Q4H  . midazolam  2 mg Intravenous Once  . pantoprazole  40 mg Oral Daily  . sodium chloride  10-40 mL Intracatheter Q12H  . sodium chloride  3 mL  Intravenous Q12H  . vancomycin  1,250 mg Intravenous Q24H  . voriconazole  200 mg Oral Q12H   Continuous Infusions:    Principal Problem:   Septic shock Active Problems:   Hodgkin lymphoma   Myelodysplasia   Adrenal abnormality   Rash and nonspecific skin eruption   DOE (dyspnea on exertion)   Symptomatic anemia   Melena   Adrenal infarction   Hypotension, unspecified   PNA (pneumonia)   Sepsis   Acute renal failure   Pulmonary infiltrate   Fever    Time spent: 30 min    Cortny Bambach, [facility] Pager [phone]. If 7PM-7AM, please contact night-coverage at [email or web address], password [facility] 11/25/2013, 12:09 PM  LOS: 18 days

## 2013-11-25 NOTE — Progress Notes (Signed)
Pharmacy Brief Note: cefepime dose adjustment  47 yof with h/o Hodgkin's lymphoma, treatment related myelodysplasia admitted 2/10 with UTI/pyelonephritis and bronchitis. Transferred to ICU overnight 2/25 due to hypotension, now in renal failure possibly from IVP dye. Has been on multiple anti-infectives  Antiinfectives  2/10 > cipro > 2/13 2/11 > aztreo > 2/13 2/11 > vanc > 2/15, **restart 2/17 >> 2/25 2/16 > primaxin > 2/21 2/16 > voriconazole > 2/18 2/17 > acyclovir > 2/20 2/21 > acyclovir > 2/22 2/21 > fluconazole > 2/26 2/25 > cefepime > 2/25 > restart vancomycin > 2/26 > voriconazole >  Plan: Day #4 vanco/cefepime Based on improved renal fx, change cefepime to 2gm IV q24h Vancomycin level in am Voriconazole d/c'd by ID  Doreene Eland, PharmD, BCPS.   Pager: [phone] 11/25/2013 12:45 PM

## 2013-11-25 NOTE — Progress Notes (Signed)
Received Patient from ICU at [facility].No complaints noted, resting quietly.

## 2013-11-25 NOTE — Progress Notes (Addendum)
[facility] for Infectious Disease    Date of Admission:  11/04/2013   Total days of antibiotics 18        Day 18 vanco        Day 4 cefepime        Day 3 voriconazole   ID: Lacey [name] is a 58 y.o. female with hx of HL s/p chemo and auto BMT in 01/2013 c/b myelodysplastic disorder initially hospitalized for HAP who finished 10 day course of therapy on 2/24 but then had leukocytosis, fever, and hypotension on 2/25, now in step down icu for monitoring. During this hospitalization found to have HAP, adrenal infarct, and on 2/24 found to have hemorrhage into right iliacus muscle as well as left iliopsoas.  Principal Problem:   Septic shock Active Problems:   Hodgkin lymphoma   Myelodysplasia   Adrenal abnormality   Rash and nonspecific skin eruption   DOE (dyspnea on exertion)   Symptomatic anemia   Melena   Adrenal infarction   Hypotension, unspecified   PNA (pneumonia)   Sepsis   Acute renal failure   Pulmonary infiltrate   Fever    Subjective: Afebrile, improving slowly, no new findings on bronch. Still has left lower quadrant discomfort  Medications:  . antiseptic oral rinse  15 mL Mouth Rinse q12n4p  . ceFEPime (MAXIPIME) IV  1 g Intravenous Q24H  . chlorhexidine  15 mL Mouth Rinse BID  . dronabinol  2.5 mg Oral BID AC  . DULoxetine  60 mg Oral QPM  . feeding supplement (ENSURE COMPLETE)  237 mL Oral TID WC  . fluticasone  2 spray Each Nare Daily  . furosemide  20 mg Intravenous Once  . hydrocortisone sod succinate (SOLU-CORTEF) inj  50 mg Intravenous BID  . lidocaine  1 patch Transdermal Q24H  . magic mouthwash w/lidocaine  10 mL Oral Q4H  . midazolam  2 mg Intravenous Once  . pantoprazole  40 mg Oral Daily  . sodium chloride  10-40 mL Intracatheter Q12H  . sodium chloride  3 mL Intravenous Q12H  . vancomycin  1,250 mg Intravenous Q24H    Objective: Vital signs in last 24 hours: Temp:  [97.4 F (36.3 C)-98.6 F (37 C)] 97.4 F (36.3 C) (02/28  2831) Resp:  [11-28] 16 (02/28 0637) BP: (106-154)/(61-83) 106/69 mmHg (02/28 0637) SpO2:  [88 %-97 %] 92 % (02/28 5176) Arterial Line BP: (113)/(74) 113/74 mmHg (02/27 1230) Weight:  [217 lb 2.5 oz (98.5 kg)] 217 lb 2.5 oz (98.5 kg) (02/28 0400) Did not exam Lab Results  Recent Labs  11/24/13 0530 11/25/13 0414  WBC 14.7* 15.9*  HGB 7.0* 8.3*  HCT 21.0* 25.7*  NA 144 144  K 3.1* 4.2  CL 105 105  CO2 25 27  BUN 32* 32*  CREATININE 2.06* 1.77*   Liver Panel  Recent Labs  11/24/13 0530 11/25/13 0414  PROT 5.5* 6.6  ALBUMIN 1.8* 2.2*  AST 55* 34  ALT 40* 40*  ALKPHOS 61 68  BILITOT 0.4 0.4    Microbiology: 2/25 blood cx pending 2/25 cdiff negative 2/25 viral culture 2/17 respiratory cx 2/16 urine cx negative 2/16 blood cx negative 2/16 rvp negative 2/11 blood cx negative  Studies/Results: Ct Pelvis Wo Contrast  11/24/2013   CLINICAL DATA:  Cancer. Fibromyalgia. Bleeding in the left iliacus muscle.  EXAM: CT PELVIS WITHOUT CONTRAST  TECHNIQUE: Multidetector CT imaging of the pelvis was performed following the standard protocol without intravenous contrast.  COMPARISON:  CT ABD/PELVIS W CM dated 11/21/2013  FINDINGS: Left iliacus muscle 4.3 cm in thickness, previously 4.6 cm. The normal right iliacus muscle measures 1.5 cm in thickness. The underlying hematoma is isodense to the muscle. The hematoma continues to track distally in the iliacus essentially to the myotendinous junction. Mildly reduced overlying stranding along the posterior perirenal fascia.  Appendix normal. Small amount of gas is present in the urinary bladder but the Foley catheter is been removed.  Stable presacral edema.  There is facet arthropathy bilaterally at L5-S1.  IMPRESSION: 1. Evolutionary findings of the left iliacus hematoma, with mildly reduced size and with more homogeneous density similar to that of the surrounding muscle.   Electronically Signed   By: Lacey [name] M.D.   On: 11/24/2013  16:16   Dg Chest Port 1 View  11/24/2013   CLINICAL DATA:  Respiratory failure  EXAM: PORTABLE CHEST - 1 VIEW  COMPARISON:  DG CHEST 2 VIEW dated 11/22/2013; DG CHEST 2 VIEW dated 11/18/2013; CT CHEST W/O CM dated 11/20/2013; CT ANGIO CHEST W/CM &/OR WO/CM dated 10/30/2013; CT BIOPSY dated 11/09/2013  FINDINGS: There is obscuration of the right heart border secondary to wedge-shaped heterogeneous/consolidative opacities involving the right middle and lower lobes. Grossly unchanged right suprahilar nodular heterogeneous opacities as demonstrated on recently performed chest CT. Pulmonary venous congestion without definite evidence of edema. Trace bilateral effusions are not excluded. No pneumothorax. Stable position of support apparatus. Grossly unchanged bones including sequela of lower cervical ACDF. Surgical clips overlie the left supraclavicular fossa.  IMPRESSION: 1. Worsening right middle and lower lobe atelectasis/collapse. 2. Grossly unchanged right suprahilar nodular heterogeneous opacities worrisome for infection as demonstrated recently performed chest CT. A follow-up chest radiograph in 4 to 6 weeks after treatment is recommended to ensure resolution.   Electronically Signed   By: Lacey [name] M.D.   On: 11/24/2013 07:26    Assessment/Plan: Intermittent fever concern for hospital acquired infection = will keep on vanco and cefepime for now. Discontinue voriconazole, would not expect her to improve considerably after 2 days of voriconazole. Recent b-d glucan and aspergillus ab are negative. Unclear what we are treating since all cultures have been NGTD. Would finish up a 7 day course of treatment. Currently on day 4 of 7. i worry that indiscriminant use of antibiotics will lead to resistance to her.  pcn allergy = she is tolerating cefepime. So appears to tolerate cephalosporins. Important to note for future hospitalizations.  Will sign off for now. Call if questions.  Lacey [name] [facility] for Infectious Diseases Cell: [phone] Pager: [phone]  11/25/2013, 12:16 PM

## 2013-11-25 NOTE — Progress Notes (Signed)
Ms. Neuenfeldt is now the ICU. Her blood pressure is doing pretty well.. Oxygen saturation is okay.  A chest x-ray yesterday showed worsening right lung collapse. Again, I am not sure as to why this is. She had a bronchoscopy. No mucous plug or obstruction was noted. She is certainly not symptomatic with this.  Her CT of the pelvis showed stability of the left iliacus hematoma. She has some weakness in the left side. I suppose this could be from his hematoma.  She got a crossmatched platelet transfusion. Her platelet count went up to 70,000. This is a very good response. Hopefully this will help with his hematoma.  She still on broad-spectrum antibiotic coverage. She is on Vfend. The bronchoscopy results so far negative.  She's afebrile. Her blood pressure is 106/69. Heart rate is about 92. Her lungs sound good. She seems to have decent breath sounds bilaterally. Cardiac exam is regular rate and rhythm. Abdomen is somewhat distended. It is soft. There may be some slight tenderness in the left lower quadrant. Bowel sounds are present. Extremities shows the persistent nonpitting edema of the right arm. There is some slight edema in her legs. Has good strength in her lower legs. She has some decreased strength in the left thigh. This might be secondary to pain from the hematoma. She has good sensation in her left leg.  Her hemoglobin was 8.3. White cell count 15.9. Her renal function is improving. Creatinine is now down to 1.77. Her albumin is a little better.  She is going to need a lot of physical therapy. Hopefully, she'll get a little stronger with a left leg once we see his hematoma resolving.  Her nutritional state will also improve.  At least, we are are able to her platelet count up so that bleeding risk is minimal.  I gave her husband an update yesterday.  We'll see about some Marinol to help with her appetite.  I still cannot understand why her right lung is having these issues.  Pete  E.  1 Cor 12:9-10

## 2013-11-26 LAB — BASIC METABOLIC PANEL
BUN: 37 mg/dL — ABNORMAL HIGH (ref 6–23)
CALCIUM: 8.4 mg/dL (ref 8.4–10.5)
CO2: 29 mEq/L (ref 19–32)
Chloride: 106 mEq/L (ref 96–112)
Creatinine, Ser: 1.43 mg/dL — ABNORMAL HIGH (ref 0.50–1.10)
GFR calc Af Amer: 45 mL/min — ABNORMAL LOW (ref >90)
GFR calc non Af Amer: 39 mL/min — ABNORMAL LOW (ref >90)
GLUCOSE: 130 mg/dL — AB (ref 70–99)
Potassium: 3.9 mEq/L (ref 3.7–5.3)
SODIUM: 146 meq/L (ref 137–147)

## 2013-11-26 LAB — CBC
HCT: 26.1 % — ABNORMAL LOW (ref 36.0–46.0)
Hemoglobin: 8.5 g/dL — ABNORMAL LOW (ref 12.0–15.0)
MCH: 28.7 pg (ref 26.0–34.0)
MCHC: 32.6 g/dL (ref 30.0–36.0)
MCV: 88.2 fL (ref 78.0–100.0)
PLATELETS: 36 10*3/uL — AB (ref 150–400)
RBC: 2.96 MIL/uL — AB (ref 3.87–5.11)
RDW: 23.4 % — ABNORMAL HIGH (ref 11.5–15.5)
WBC: 14.5 10*3/uL — ABNORMAL HIGH (ref 4.0–10.5)

## 2013-11-26 LAB — CULTURE, BAL-QUANTITATIVE: Colony Count: NO GROWTH

## 2013-11-26 LAB — CULTURE, BAL-QUANTITATIVE W GRAM STAIN: Culture: NO GROWTH

## 2013-11-26 LAB — VANCOMYCIN, TROUGH: Vancomycin Tr: 22.2 ug/mL — ABNORMAL HIGH (ref 10.0–20.0)

## 2013-11-26 MED ORDER — PREDNISONE 10 MG PO TABS
10.0000 mg | ORAL_TABLET | Freq: Every day | ORAL | Status: DC
  Administered 2013-11-26 – 2013-11-27 (×2): 10 mg via ORAL
  Filled 2013-11-26 (×3): qty 1

## 2013-11-26 NOTE — Progress Notes (Signed)
[facility] PROGRESS NOTE  Lacey [name] IDP:[id number] DOB: 03-25-1956 DOA: 11/17/2013 PCP: Vena Austria, MD  Assessment/Plan  CV:  Septic v. hypovolemic shock v. adrenal crisis.   Tele:  HR 100 bpm, resolving -   Appreciate PCCM assistance -   Prednisone once daily -   Tx for sepsis as below  -   Need to d/c PICC   Acute on chronic diastolic heart failure.  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  CT chest on 2/23:  Persistent consolidation RML with new peribronchovascular opacities suggestive of bronchopneumonia.  Underwent bronch on 2/26 -  Appreciate ID and Pulmonlogy assistance -  Repeat BCx NGTD -  BAL:  PCP neg, Bacterial, fungal, and AFB cultures NGTD -  UA:  Neg -  Continue vanc and cefepime per ID -  C. Diff PCR neg  HCAP with partial loculation of the right pleural effusion, s/p 14 days of broad spectrum abx.  Flu and aspergillus neg.  Mouth Ulcer; Herpes virus culture no herpes detected.  Acyclovir discontinued.  Soft tissue stranding in the anterior mediastinum and left thigh is of uncertain etiology and significance. If there been recent attempted central line placement, this could be iatrogenic. Less likely, this might be infectious or inflammatory, and clinical correlation for signs and symptoms of mediastinitis may be warranted  -  Unclear etiology, but not complaining of chest pain  Skin eruptions, resolved.  Unclear etiology   Acute Renal Failure likely due to sepsis, dehydration and resolving, baseline cr 1 -  RUS wnl  Lethargy, resolved.  May have been lingering effects of gabapentin and narcotics in setting of AKI.   -  CT head neg -  Ammonia level:  19 -  Continue to minimize sedating medications  Spontaneous retroperitoneal bleed in setting of  thrombocytopenia, resolving on recent CT -  Goal hgb > 7 and goal plt near 50K -  INR 1.27  Mixed acute blood loss anemia plus marrow failure in setting of Hodgkin Lymphoma -  Appreciate Dr Jonette Eva assistance.  -  Bone Marrow Biopsy: HYPERCELLULAR BONE MARROW FOR AGE WITH DYSPOIETIC CHANGES. -  May need bone marrow transplant once medically ready  Thrombocytopenia:  No schistocytes to suggest DIC or TTP, improved with plt tx but dropped again today -  Defer to hematology  Right upper extremity superficial thrombosis Swelling decreasing.    GI:  Mild elevation in LFTs may have been due to sepsis and trending down  Diet:  regular Access:  PICC IVF:  on Proph:  Hold for thrombocytopenia, spont retroperitoneal bleed  Code Status: full Family Communication: patient alone Disposition Plan:  Clinically improving.  PT/OT recommending CIR.  Need plan for marrow failure, however, because she is requiring frequent blood and plt transfusions.    Consultants:  Dr. Burney Gauze (oncology)  Dr Gibson Ramp (Surgery) curbside consult  Dr. Carlyle Basques (infectious disease)  Procedures:  11/12/2013 right upper extremity Doppler  No evidence of deep vein thrombosis involving the right upper extremity, left subclavian vein, and left internal jugular vein. There is superficial thrombosis noted in the right cephalic vein.  -4/43 Bone marrow biopsy report pending  Echocardiogram 01/10/2013  - Left ventricle: mild focal basal hypertrophy of the septum.  -LVEF= ejection fraction was 55%. Wall motion was normal; there were no regional wall  motion abnormalities.  -(grade 1 diastolic dysfunction).  - Aortic valve: There was no stenosis. Trivial regurgitation. - Mitral valve: Trivial regurgitation. - Left atrium: The atrium was mildly dilated. - Right ventricle: The cavity size was normal. Systolic function was normal. - Tricuspid valve: Peak RV-RA gradient: 48mm Hg (S). - Pulmonary arteries: PA  peak pressure: 17mm Hg (S). - Inferior vena cava: The vessel was normal in size; the respirophasic diameter changes were in the normal range (=50%); findings are consistent with normal central venous pressure.  Antibiotics:  Aztreonam 2/11>> stopped 2/13  Ciprofloxacin 2/11>> stopped 2/15  Vancomycin 2/11>> stopped 2/15  Primaxin>> 2/16>> 2/21 Voriconazole 2/16>> stopped 2/18, restarted 2/26 Vancomycin 2/17>> 2/24, restarted 2/26 Cefepime 2/25 >>  HPI/Subjective:  Denies nausea. Wheeze improved today.  Persistent pain LLQ and left hip.    Objective: Filed Vitals:   11/25/13 1400 11/26/13 0535 11/26/13 0736 11/26/13 1432  BP: 123/77 176/99  153/89  Pulse: 106 106  127  Temp: 98.3 F (36.8 C) 97.9 F (36.6 C)  98.4 F (36.9 C)  TempSrc: Oral Oral  Oral  Resp: $Remo'16 18  18  'JrRLq$ Height:      Weight:   101 kg (222 lb 10.6 oz)   SpO2: 95% 94%  99%   No intake or output data in the 24 hours ending 11/26/13 1943 Filed Weights   11/22/13 0500 11/25/13 0400 11/26/13 0736  Weight: 92.9 kg (204 lb 12.9 oz) 98.5 kg (217 lb 2.5 oz) 101 kg (222 lb 10.6 oz)    Exam:   General:  CF, No acute distress  HEENT:  NCAT, MMM  Cardiovascular:  RRR, nl S1, S2 no mrg, 2+ pulses, warm extremities  Respiratory:  CTAB, no increased WOB  Abdomen:   NABS, soft, mildly distended, mild TTP particularly in LLQ without rebound or guarding  MSK:   Normal tone and bulk, 2+ RUE edema, 1+ LUE, and bilateral LE edema pitting.  PICC left arm  Neuro:  Grossly intact  Psych:  Alert and cooperative  Data Reviewed: Basic Metabolic Panel:  Recent Labs Lab 11/21/13 0330 11/22/13 0338 11/22/13 1815 10/31/2013 0320 11/24/13 0530 11/25/13 0414 11/26/13 0526  NA 140 142 142 143 144 144 146  K 3.4* 3.2* 3.7 3.7 3.1* 4.2 3.9  CL 99 99 102 103 105 105 106  CO2 31 33* $Remo'29 25 25 27 29  'zjSos$ GLUCOSE 163* 109* 84 109* 172* 146* 130*  BUN 19 22 29* 31* 32* 32* 37*  CREATININE 1.00 1.55* 2.31* 2.60* 2.06* 1.77* 1.43*   CALCIUM 7.9* 7.9* 7.7* 7.6* 7.5* 8.3* 8.4  MG 1.5 1.5  --  1.2* 1.7 2.4  --    Liver Function Tests:  Recent Labs Lab 11/21/13 0330 11/22/13 0338 11/16/2013 0320 11/24/13 0530 11/25/13 0414  AST 21 26 105* 55* 34  ALT 13 15 39* 40* 40*  ALKPHOS 71 57 65 61 68  BILITOT 1.1 0.9 1.1 0.4 0.4  PROT 5.8* 5.4* 5.4* 5.5* 6.6  ALBUMIN 2.3* 2.1* 2.0* 1.8* 2.2*   No results found for this basename: LIPASE, AMYLASE,  in the last 168 hours  Recent Labs Lab 11/22/13 1815  AMMONIA 19   CBC:  Recent Labs Lab 11/22/13 0338  11/22/13 2356 11/10/2013 0320 11/24/13 0530 11/24/13 0840 11/25/13 0414 11/26/13 0526  WBC 13.5*  < > 15.0* 19.7* 14.7*  --  15.9* 14.5*  NEUTROABS 9.3*  --  12.0* 15.5* 10.6*  --  12.8*  --   HGB 6.3*  < >  7.6* 7.5* 7.0*  --  8.3* 8.5*  HCT 19.7*  < > 23.4* 22.9* 21.0*  --  25.7* 26.1*  MCV 94.3  < > 88.6 88.4 86.8  --  87.1 88.2  PLT 32*  < > PLATELET CLUMPS NOTED ON SMEAR, COUNT APPEARS DECREASED 36* 23* 22* 70* 36*  < > = values in this interval not displayed. Cardiac Enzymes:  Recent Labs Lab 11/22/13 1815  TROPONINI <0.30   BNP (last 3 results)  Recent Labs  11/14/13 0435 11/17/13 0800 11/20/13 0515  PROBNP 2977.0* 9541.0* 3178.0*   CBG: No results found for this basename: GLUCAP,  in the last 168 hours  Recent Results (from the past 240 hour(s))  VIRAL CULTURE VIRC     Status: None   Collection Time    11/22/13 11:46 AM      Result Value Ref Range Status   Specimen Description ULCER MOUTH   Final   Special Requests Immunocompromised   Final   Culture     Final   Value: Culture has been initiated.     Note:    The current Enterovirus culture system does not detect Enterovirus D68. If Enterovirus D68 is suspected, please call client services to add the test for Enterovirus PCR (Test code [phone]).     Performed at [facility]   Report Status PENDING   Incomplete  CLOSTRIDIUM DIFFICILE BY PCR     Status: None   Collection Time     11/22/13  7:34 PM      Result Value Ref Range Status   C difficile by pcr NEGATIVE  NEGATIVE Final   Comment: Performed at [facility], BLOOD (SINGLE)     Status: None   Collection Time    11/22/13 11:30 PM      Result Value Ref Range Status   Specimen Description BLOOD PIC LINE   Final   Special Requests BOTTLES DRAWN AEROBIC AND ANAEROBIC   Final   Culture  Setup Time     Final   Value: 11/01/2013 04:04     Performed at [facility]   Culture     Final   Value:        BLOOD CULTURE RECEIVED NO GROWTH TO DATE CULTURE WILL BE HELD FOR 5 DAYS BEFORE ISSUING A FINAL NEGATIVE REPORT     Performed at [facility]   Report Status PENDING   Incomplete  AFB CULTURE WITH SMEAR     Status: None   Collection Time    11/24/2013  2:54 PM      Result Value Ref Range Status   Specimen Description BRONCHIAL ALVEOLAR LAVAGE   Final   Special Requests Immunocompromised   Final   ACID FAST SMEAR     Final   Value: NO ACID FAST BACILLI SEEN     Performed at [facility]   Culture     Final   Value: CULTURE WILL BE EXAMINED FOR 6 WEEKS BEFORE ISSUING A FINAL REPORT     Performed at [facility]   Report Status PENDING   Incomplete  CULTURE, BAL-QUANTITATIVE     Status: None   Collection Time    11/16/2013  2:54 PM      Result Value Ref Range Status   Specimen Description BRONCHIAL ALVEOLAR LAVAGE   Final   Special Requests Immunocompromised   Final   Gram Stain     Final   Value: FEW WBC PRESENT,  PREDOMINANTLY PMN     NO SQUAMOUS EPITHELIAL CELLS SEEN     NO ORGANISMS SEEN     Performed at [facility]     Final   Value: NO GROWTH     Performed at [facility]   Culture     Final   Value: NO GROWTH 2 DAYS     Performed at [facility]   Report Status 11/26/2013 FINAL   Final  FUNGUS CULTURE W SMEAR     Status: None   Collection Time    11/05/2013  2:54 PM      Result Value Ref Range Status   Specimen  Description BRONCHIAL WASHINGS   Final   Special Requests Immunocompromised   Final   Fungal Smear     Final   Value: NO YEAST OR FUNGAL ELEMENTS SEEN     Performed at [facility]   Culture     Final   Value: CULTURE IN PROGRESS FOR FOUR WEEKS     Performed at [facility]   Report Status PENDING   Incomplete  PNEUMOCYSTIS JIROVECI SMEAR BY DFA     Status: None   Collection Time    11/07/2013  2:54 PM      Result Value Ref Range Status   Specimen Source-[facility] BRONCHIAL ALVEOLAR LAVAGE   Final   Pneumocystis jiroveci Ag NEGATIVE   Final   Comment: Performed at [facility]  LEGIONELLA CULTURE     Status: None   Collection Time    11/20/2013  2:54 PM      Result Value Ref Range Status   Specimen Description BRONCHIAL WASHINGS   Final   Special Requests Immunocompromised   Final   Culture     Final   Value: NO LEGIONELLA ISOLATED, CULTURE IN PROGRESS FOR 5 DAYS     Performed at [facility]   Report Status PENDING   Incomplete     Studies: No results found.  Scheduled Meds: . antiseptic oral rinse  15 mL Mouth Rinse q12n4p  . ceFEPime (MAXIPIME) IV  2 g Intravenous Q24H  . chlorhexidine  15 mL Mouth Rinse BID  . dronabinol  2.5 mg Oral BID AC  . DULoxetine  60 mg Oral QPM  . feeding supplement (ENSURE COMPLETE)  237 mL Oral TID WC  . fluticasone  2 spray Each Nare Daily  . lidocaine  1 patch Transdermal Q24H  . magic mouthwash w/lidocaine  10 mL Oral Q4H  . midazolam  2 mg Intravenous Once  . pantoprazole  40 mg Oral Daily  . predniSONE  10 mg Oral Q breakfast  . sodium chloride  10-40 mL Intracatheter Q12H  . sodium chloride  3 mL Intravenous Q12H  . vancomycin  1,250 mg Intravenous Q24H   Continuous Infusions:    Principal Problem:   Septic shock Active Problems:   Hodgkin lymphoma   Myelodysplasia   Adrenal abnormality   Rash and nonspecific skin eruption   DOE (dyspnea on exertion)   Symptomatic anemia   Melena    Adrenal infarction   Hypotension, unspecified   PNA (pneumonia)   Sepsis   Acute renal failure   Pulmonary infiltrate   Fever    Time spent: 30 min    Lionell Matuszak, [facility] Pager [phone]. If 7PM-7AM, please contact night-coverage at [email or web address], password [facility] 11/26/2013, 7:43 PM  LOS: 19 days

## 2013-11-26 NOTE — Progress Notes (Signed)
ANTIBIOTIC CONSULT NOTE - follow up  Pharmacy Consult for vancomycin, cefepime, voriconazole Indication: rule out pneumonia, fever, immunocompromised status, ?sinus infection  Allergies  Allergen Reactions  . Penicillins Hives and Rash  . Contrast Media [Iodinated Diagnostic Agents]     Patient Measurements: Height: 5' (152.4 cm) Weight: 222 lb 10.6 oz (101 kg) IBW/kg (Calculated) : 45.5 Adjusted Body Weight: 64.5kg  Vital Signs: Temp: 97.9 F (36.6 C) (03/01 0535) Temp src: Oral (03/01 0535) BP: 176/99 mmHg (03/01 0535) Pulse Rate: 106 (03/01 0535) Intake/Output from previous day:   Intake/Output from this shift:    Labs:  Recent Labs  11/24/13 0530 11/24/13 0840 11/25/13 0414 11/26/13 0526  WBC 14.7*  --  15.9* 14.5*  HGB 7.0*  --  8.3* 8.5*  PLT 23* 22* 70* 36*  CREATININE 2.06*  --  1.77* 1.43*   Estimated Creatinine Clearance: 45.3 ml/min (by C-G formula based on Cr of 1.43).   Medical History: Past Medical History  Diagnosis Date  . Cancer   . Cough productive of clear sputum 01/04/2013  . Fibromyalgia     Medications:  Scheduled:  . antiseptic oral rinse  15 mL Mouth Rinse q12n4p  . ceFEPime (MAXIPIME) IV  2 g Intravenous Q24H  . chlorhexidine  15 mL Mouth Rinse BID  . dronabinol  2.5 mg Oral BID AC  . DULoxetine  60 mg Oral QPM  . feeding supplement (ENSURE COMPLETE)  237 mL Oral TID WC  . fluticasone  2 spray Each Nare Daily  . lidocaine  1 patch Transdermal Q24H  . magic mouthwash w/lidocaine  10 mL Oral Q4H  . midazolam  2 mg Intravenous Once  . pantoprazole  40 mg Oral Daily  . predniSONE  10 mg Oral Q breakfast  . sodium chloride  10-40 mL Intracatheter Q12H  . sodium chloride  3 mL Intravenous Q12H  . vancomycin  1,250 mg Intravenous Q24H   Infusions:    Assessment: 79 yof with h/o Hodgkin's lymphoma, treatment related myelodysplasia admitted 2/10 with UTI/pyelonephritis and bronchitis. Transferred to ICU overnight 2/25 due to  hypotension, now in renal failure possibly from IVP dye. Has been on multiple anti-infectives as listed below. Oncology changed fluconazole to voriconazole 2/26 per Pharmacy, Pharmacy also dosing cefepime and vancomycin for r/o HCAP.  2/10 > cipro > 2/13 2/11 > aztreo > 2/13 2/11 > vanc > 2/15, **restart 2/17 >> 2/25 2/16 > primaxin > 2/21 2/16 > voriconazole > 2/18 2/17 > acyclovir > 2/20 2/21 > acyclovir > 2/22 2/21 > fluconazole > 2/26 2/25 > cefepime > 2/25 > restart vancomycin > 2/26 > voriconazole > 2/28  Tmax: afeb WBCs: 14.5 (myelodysplastic and myeloproliferative d/o, steroids) Renal: ARF, SCr improved to 1.43, CrCl 27ml/min CG, 85ml/min (N) PCT > 175, lactic acid 1.2 (2/18), PCT 51 (2/27)  2/11 blood x2: NGF 2/11 flu panel: neg 2/14 resp virus panel: neg 2/16 blood x 2: NGF 2/16 urine: NGF 2/17 Sputum: nml flora 2/17 HSV (mouth): neg 2/17 Aspergillus galactomannan Ag: Not Detected  2/18 fungitell (beta D glucan) test: neg 2/25 viral Cx (from mouth): pend 2/25 C.diff: neg 2/26 BAL: pending (no legionella, no AFB, no yeast or fungal elements, no pneumocystis)  Levels: 2/13 VT at 1700 = 20.3 on 750mg  q12h, decr to 500mg  IV q12h 2/20 VT = 17 on 1250mg  Q24h, no change 2/26 VT 11.3 after 1250mg  (LD 2/24 0955), redose with 1250mg  x1 this am, recheck VT 2/27  2/27 VT 15.7, 20hr post 1250mg   dose, restart 1250mg  q12h, check VT 3/1 3/1 0530 vanc random = 22.32mcg/ml  Drawn 2.5hr prior to 8am dose of 1250mg  IV q24 (dose 2/28am givne at 10:30)  Goal of Therapy:  Vancomycin trough level 15-20 mcg/ml eradication of infection  Plan:  Day #5 vancomycin/Cefepime  Continue vancomycin 1250mg  IV q24h based on random level dose appears appropriate, especially considering timing of level to dose and timing of previous dose as well as improved Scr.   Continue to follow renal function  Continue cefepime 2g IV q24h  At this time, ID suggests using course of 7 days of above  antibiotics  Thank you for the consult.  Doreene Eland, PharmD, BCPS.   Pager: [phone] 11/26/2013 9:23 AM

## 2013-11-26 DEATH — deceased

## 2013-11-27 ENCOUNTER — Inpatient Hospital Stay (HOSPITAL_COMMUNITY): Payer: BC Managed Care – PPO

## 2013-11-27 DIAGNOSIS — A4189 Other specified sepsis: Secondary | ICD-10-CM

## 2013-11-27 DIAGNOSIS — R5381 Other malaise: Secondary | ICD-10-CM

## 2013-11-27 DIAGNOSIS — I5033 Acute on chronic diastolic (congestive) heart failure: Secondary | ICD-10-CM | POA: Diagnosis not present

## 2013-11-27 LAB — TYPE AND SCREEN
ABO/RH(D): O POS
ANTIBODY SCREEN: POSITIVE
DAT, IgG: NEGATIVE
DONOR AG TYPE: NEGATIVE
Donor AG Type: NEGATIVE
Donor AG Type: NEGATIVE
Donor AG Type: NEGATIVE
UNIT DIVISION: 0
Unit division: 0
Unit division: 0
Unit division: 0

## 2013-11-27 LAB — CBC
HCT: 26.4 % — ABNORMAL LOW (ref 36.0–46.0)
Hemoglobin: 8.5 g/dL — ABNORMAL LOW (ref 12.0–15.0)
MCH: 28.5 pg (ref 26.0–34.0)
MCHC: 32.2 g/dL (ref 30.0–36.0)
MCV: 88.6 fL (ref 78.0–100.0)
Platelets: 28 10*3/uL — CL (ref 150–400)
RBC: 2.98 MIL/uL — ABNORMAL LOW (ref 3.87–5.11)
RDW: 22.8 % — ABNORMAL HIGH (ref 11.5–15.5)
WBC: 15.9 10*3/uL — ABNORMAL HIGH (ref 4.0–10.5)

## 2013-11-27 LAB — BASIC METABOLIC PANEL
BUN: 30 mg/dL — ABNORMAL HIGH (ref 6–23)
CO2: 29 mEq/L (ref 19–32)
Calcium: 8.5 mg/dL (ref 8.4–10.5)
Chloride: 109 mEq/L (ref 96–112)
Creatinine, Ser: 1.08 mg/dL (ref 0.50–1.10)
GFR calc Af Amer: 64 mL/min — ABNORMAL LOW (ref >90)
GFR calc non Af Amer: 55 mL/min — ABNORMAL LOW (ref >90)
Glucose, Bld: 97 mg/dL (ref 70–99)
Potassium: 3.1 mEq/L — ABNORMAL LOW (ref 3.7–5.3)
Sodium: 148 mEq/L — ABNORMAL HIGH (ref 137–147)

## 2013-11-27 LAB — LACTATE DEHYDROGENASE: LDH: 760 U/L — ABNORMAL HIGH (ref 94–250)

## 2013-11-27 LAB — PREPARE PLATELET PHERESIS: UNIT DIVISION: 0

## 2013-11-27 MED ORDER — POTASSIUM CHLORIDE CRYS ER 20 MEQ PO TBCR
40.0000 meq | EXTENDED_RELEASE_TABLET | Freq: Once | ORAL | Status: AC
  Administered 2013-11-27: 40 meq via ORAL
  Filled 2013-11-27: qty 2

## 2013-11-27 MED ORDER — ACETAMINOPHEN 325 MG PO TABS
650.0000 mg | ORAL_TABLET | Freq: Once | ORAL | Status: AC
  Administered 2013-11-27: 650 mg via ORAL

## 2013-11-27 MED ORDER — POTASSIUM CHLORIDE CRYS ER 20 MEQ PO TBCR
20.0000 meq | EXTENDED_RELEASE_TABLET | Freq: Every day | ORAL | Status: DC
Start: 2013-11-28 — End: 2013-11-28
  Filled 2013-11-27: qty 1

## 2013-11-27 MED ORDER — FUROSEMIDE 20 MG PO TABS
20.0000 mg | ORAL_TABLET | Freq: Every day | ORAL | Status: DC
  Administered 2013-11-27: 20 mg via ORAL
  Filled 2013-11-27 (×2): qty 1

## 2013-11-27 MED ORDER — FENTANYL 12 MCG/HR TD PT72
12.5000 ug | MEDICATED_PATCH | TRANSDERMAL | Status: DC
  Administered 2013-11-27 – 2013-11-30 (×2): 12.5 ug via TRANSDERMAL
  Filled 2013-11-27 (×2): qty 1

## 2013-11-27 MED ORDER — DIPHENHYDRAMINE HCL 25 MG PO CAPS
25.0000 mg | ORAL_CAPSULE | Freq: Once | ORAL | Status: AC
  Administered 2013-11-27: 25 mg via ORAL
  Filled 2013-11-27: qty 1

## 2013-11-27 MED ORDER — PREDNISONE 5 MG PO TABS
5.0000 mg | ORAL_TABLET | Freq: Every day | ORAL | Status: DC
  Administered 2013-11-28 – 2013-12-02 (×5): 5 mg via ORAL
  Filled 2013-11-27 (×6): qty 1

## 2013-11-27 MED ORDER — DRONABINOL 2.5 MG PO CAPS
5.0000 mg | ORAL_CAPSULE | Freq: Two times a day (BID) | ORAL | Status: DC
  Administered 2013-11-27 – 2013-12-02 (×10): 5 mg via ORAL
  Filled 2013-11-27 (×10): qty 1

## 2013-11-27 MED ORDER — HYDRALAZINE HCL 20 MG/ML IJ SOLN
10.0000 mg | INTRAMUSCULAR | Status: DC | PRN
  Administered 2013-11-27: 10 mg via INTRAVENOUS
  Filled 2013-11-27: qty 0.5

## 2013-11-27 NOTE — Progress Notes (Addendum)
[facility] PROGRESS NOTE  Lacey [name] KGY:[id number] DOB: 10/27/1955 DOA: 11/24/2013 PCP: Vena Austria, MD  Assessment/Plan  Myelodysplastic syndrome in setting of Hodgkin Lymphoma:  Refractory anemia and thrombocytopenia requiring almost daily blood product transfusions, Bone Marrow Biopsy: HYPERCELLULAR BONE MARROW FOR AGE WITH DYSPOIETIC CHANGES. -  Appreciate Dr Jonette Eva assistance.  -  Cannot continue to perform daily transfusions as patient is developing antibodies.  Need treatment solution or palliative care plan.  Defer to Oncology.    Recurrent fever/septic shock with unknown source, resolving on broad spectrum abx.  CT chest on 2/23:  Persistent consolidation RML with new peribronchovascular opacities suggestive of bronchopneumonia.  Underwent bronch on 2/26 -  Repeat BCx NGTD -  BAL:  PCP neg, Bacterial, fungal, and AFB cultures NGTD -  UA:  Neg -  Continue vanc and cefepime through 3/3, then stop -  C. Diff PCR neg  Acute on chronic diastolic heart failure.  Preserved EF and grade 1 diastolic heart failure, some improvement in wheezing with lasix -  CXR this AM, stable with persistent fluid in fissue -  Start low dose daily lasix  Elevated BPs, may be lingering effect of steroids, pain.  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  RUS wnl.  Spontaneous retroperitoneal bleed in setting of thrombocytopenia, resolving.    Right upper extremity superficial thrombosis, stable   Transaminitis due to septic shock, resolved.    Urge and stress incontinence -  Foley placed for comfort  Hypokalemia due to lasix -  Start daily oral repletion  Diet:  regular Access:  PICC IVF:  on Proph:  Hold for thrombocytopenia, spont retroperitoneal  bleed  Code Status: full Family Communication: patient alone Disposition Plan:  Clinically improving.  PT/OT recommending CIR.  Need plan for marrow failure, however, because she is requiring frequent blood and plt transfusions.    Consultants:  Dr. Burney Gauze (oncology)  Dr Gibson Ramp (Surgery) curbside consult  Dr. Carlyle Basques (infectious disease)  Procedures:  11/12/2013 right upper extremity Doppler  No evidence of deep vein thrombosis involving the right upper extremity, left subclavian vein, and left internal jugular vein. There is superficial thrombosis noted in the right cephalic vein.  -0/26 Bone marrow biopsy report pending  Echocardiogram 01/10/2013  - Left ventricle: mild focal basal hypertrophy of the septum.  -LVEF= ejection fraction was 55%. Wall motion was normal; there were no regional wall motion abnormalities.  -(grade 1 diastolic dysfunction).  - Aortic valve: There was no stenosis. Trivial regurgitation. - Mitral valve: Trivial regurgitation. - Left atrium: The atrium was mildly dilated. - Right ventricle: The cavity size was normal. Systolic function was normal. - Tricuspid valve: Peak RV-RA gradient: 82mm Hg (S). - Pulmonary arteries: PA peak pressure: 81mm Hg (S). - Inferior vena cava: The vessel was normal in size; the respirophasic diameter changes were in the normal range (=50%); findings are consistent with normal central venous pressure.  Antibiotics:  Aztreonam 2/11>> stopped 2/13  Ciprofloxacin 2/11>> stopped 2/15  Vancomycin 2/11>> stopped 2/15  Primaxin>> 2/16>> 2/21 Voriconazole 2/16>> stopped 2/18, restarted 2/26 Vancomycin 2/17>> 2/24, restarted 2/26 Cefepime 2/25 >>  HPI/Subjective:  Denies nausea.  Worse wheeze today.  Persistent pain LLQ and left hip.  Unable to move left leg  much  Objective: Filed Vitals:   11/26/13 1432 11/26/13 2147 11/27/13 0611 11/27/13 1345  BP: 153/89 165/85 198/92 136/71  Pulse: 127 100 98 111  Temp:  98.4 F (36.9 C) 98 F (36.7 C) 97.6 F (36.4 C) 98.1 F (36.7 C)  TempSrc: Oral Oral Oral Oral  Resp: $Remo'18 18 18 20  'jJnsj$ Height:      Weight:      SpO2: 99% 95% 98% 97%    Intake/Output Summary (Last 24 hours) at 11/27/13 1753 Last data filed at 11/26/13 2000  Gross per 24 hour  Intake      0 ml  Output    350 ml  Net   -350 ml   Filed Weights   11/22/13 0500 11/25/13 0400 11/26/13 0736  Weight: 92.9 kg (204 lb 12.9 oz) 98.5 kg (217 lb 2.5 oz) 101 kg (222 lb 10.6 oz)    Exam:   General:  CF, No acute distress  HEENT:  NCAT, MMM  Cardiovascular:  RRR, nl S1, S2 no mrg, 2+ pulses, warm extremities  Respiratory:  Full exp wheeze again today, no rales or rhonchi, no increased WOB  Abdomen:   NABS, soft, mildly distended, mild TTP particularly in LLQ without rebound or guarding  MSK:   Normal tone and bulk, 2+ RUE edema, 1+ LUE, and bilateral LE edema pitting.  PICC left arm  Neuro:  Unable to flex left hip due to pain and weakness, otherwise intact  Psych:  Alert and cooperative  Data Reviewed: Basic Metabolic Panel:  Recent Labs Lab 11/21/13 0330 11/22/13 0338  11/05/2013 0320 11/24/13 0530 11/25/13 0414 11/26/13 0526 11/27/13 0428  NA 140 142  < > 143 144 144 146 148*  K 3.4* 3.2*  < > 3.7 3.1* 4.2 3.9 3.1*  CL 99 99  < > 103 105 105 106 109  CO2 31 33*  < > $R'25 25 27 29 29  'RL$ GLUCOSE 163* 109*  < > 109* 172* 146* 130* 97  BUN 19 22  < > 31* 32* 32* 37* 30*  CREATININE 1.00 1.55*  < > 2.60* 2.06* 1.77* 1.43* 1.08  CALCIUM 7.9* 7.9*  < > 7.6* 7.5* 8.3* 8.4 8.5  MG 1.5 1.5  --  1.2* 1.7 2.4  --   --   < > = values in this interval not displayed. Liver Function Tests:  Recent Labs Lab 11/21/13 0330 11/22/13 0338 11/11/2013 0320 11/24/13 0530 11/25/13 0414  AST 21 26 105* 55* 34  ALT 13 15 39* 40* 40*  ALKPHOS 71 57 65 61 68  BILITOT 1.1 0.9 1.1 0.4 0.4  PROT 5.8* 5.4* 5.4* 5.5* 6.6  ALBUMIN 2.3* 2.1* 2.0* 1.8* 2.2*   No results found for this basename:  LIPASE, AMYLASE,  in the last 168 hours  Recent Labs Lab 11/22/13 1815  AMMONIA 19   CBC:  Recent Labs Lab 11/22/13 0338  11/22/13 2356 11/22/2013 0320 11/24/13 0530 11/24/13 0840 11/25/13 0414 11/26/13 0526 11/27/13 0428  WBC 13.5*  < > 15.0* 19.7* 14.7*  --  15.9* 14.5* 15.9*  NEUTROABS 9.3*  --  12.0* 15.5* 10.6*  --  12.8*  --   --   HGB 6.3*  < > 7.6* 7.5* 7.0*  --  8.3* 8.5* 8.5*  HCT 19.7*  < > 23.4* 22.9* 21.0*  --  25.7* 26.1* 26.4*  MCV 94.3  < > 88.6 88.4 86.8  --  87.1 88.2 88.6  PLT 32*  < > PLATELET CLUMPS  NOTED ON SMEAR, COUNT APPEARS DECREASED 36* 23* 22* 70* 36* 28*  < > = values in this interval not displayed. Cardiac Enzymes:  Recent Labs Lab 11/22/13 1815  TROPONINI <0.30   BNP (last 3 results)  Recent Labs  11/14/13 0435 11/17/13 0800 11/20/13 0515  PROBNP 2977.0* 9541.0* 3178.0*   CBG: No results found for this basename: GLUCAP,  in the last 168 hours  Recent Results (from the past 240 hour(s))  VIRAL CULTURE VIRC     Status: None   Collection Time    11/22/13 11:46 AM      Result Value Ref Range Status   Specimen Description ULCER MOUTH   Final   Special Requests Immunocompromised   Final   Culture     Final   Value: CONTINUING TO HOLD     Note:    The current Enterovirus culture system does not detect Enterovirus D68. If Enterovirus D68 is suspected, please call client services to add the test for Enterovirus PCR (Test code [phone]).     Performed at [facility]   Report Status PENDING   Incomplete  CLOSTRIDIUM DIFFICILE BY PCR     Status: None   Collection Time    11/22/13  7:34 PM      Result Value Ref Range Status   C difficile by pcr NEGATIVE  NEGATIVE Final   Comment: Performed at [facility], BLOOD (SINGLE)     Status: None   Collection Time    11/22/13 11:30 PM      Result Value Ref Range Status   Specimen Description BLOOD PIC LINE   Final   Special Requests BOTTLES DRAWN AEROBIC AND ANAEROBIC    Final   Culture  Setup Time     Final   Value: 11/11/2013 04:04     Performed at [facility]   Culture     Final   Value:        BLOOD CULTURE RECEIVED NO GROWTH TO DATE CULTURE WILL BE HELD FOR 5 DAYS BEFORE ISSUING A FINAL NEGATIVE REPORT     Performed at [facility]   Report Status PENDING   Incomplete  AFB CULTURE WITH SMEAR     Status: None   Collection Time    11/18/2013  2:54 PM      Result Value Ref Range Status   Specimen Description BRONCHIAL ALVEOLAR LAVAGE   Final   Special Requests Immunocompromised   Final   ACID FAST SMEAR     Final   Value: NO ACID FAST BACILLI SEEN     Performed at [facility]   Culture     Final   Value: CULTURE WILL BE EXAMINED FOR 6 WEEKS BEFORE ISSUING A FINAL REPORT     Performed at [facility]   Report Status PENDING   Incomplete  CULTURE, BAL-QUANTITATIVE     Status: None   Collection Time    11/17/2013  2:54 PM      Result Value Ref Range Status   Specimen Description BRONCHIAL ALVEOLAR LAVAGE   Final   Special Requests Immunocompromised   Final   Gram Stain     Final   Value: FEW WBC PRESENT, PREDOMINANTLY PMN     NO SQUAMOUS EPITHELIAL CELLS SEEN     NO ORGANISMS SEEN     Performed at [facility]     Final   Value: NO GROWTH  Performed at [facility]     Final   Value: NO GROWTH 2 DAYS     Performed at [facility]   Report Status 11/26/2013 FINAL   Final  FUNGUS CULTURE W SMEAR     Status: None   Collection Time    11/20/2013  2:54 PM      Result Value Ref Range Status   Specimen Description BRONCHIAL WASHINGS   Final   Special Requests Immunocompromised   Final   Fungal Smear     Final   Value: NO YEAST OR FUNGAL ELEMENTS SEEN     Performed at [facility]   Culture     Final   Value: CULTURE IN PROGRESS FOR FOUR WEEKS     Performed at [facility]   Report Status PENDING   Incomplete  PNEUMOCYSTIS JIROVECI SMEAR BY DFA      Status: None   Collection Time    11/09/2013  2:54 PM      Result Value Ref Range Status   Specimen Source-PJSRC BRONCHIAL ALVEOLAR LAVAGE   Final   Pneumocystis jiroveci Ag NEGATIVE   Final   Comment: Performed at [facility]  LEGIONELLA CULTURE     Status: None   Collection Time    11/11/2013  2:54 PM      Result Value Ref Range Status   Specimen Description BRONCHIAL WASHINGS   Final   Special Requests Immunocompromised   Final   Culture     Final   Value: NO LEGIONELLA ISOLATED, CULTURE IN PROGRESS FOR 5 DAYS     Performed at [facility]   Report Status PENDING   Incomplete     Studies: Dg Chest 2 View  11/27/2013   CLINICAL DATA:  Soreness of breath.  Fever and cough  EXAM: CHEST  2 VIEW  COMPARISON:  11/24/2013  FINDINGS: There is a left arm PICC line with tip in the projection of the SVC. The heart size appears normal. Right middle lobe atelectasis is unchanged from previous examination. The right upper lobe perihilar opacity is also stable. There is increased fluid within the major fissure of the right lung. Left lung is relatively clear.  IMPRESSION: 1. No significant change compared with previous exam   Electronically Signed   By: Kerby Moors M.D.   On: 11/27/2013 09:31    Scheduled Meds: . antiseptic oral rinse  15 mL Mouth Rinse q12n4p  . chlorhexidine  15 mL Mouth Rinse BID  . dronabinol  5 mg Oral BID AC  . DULoxetine  60 mg Oral QPM  . feeding supplement (ENSURE COMPLETE)  237 mL Oral TID WC  . fentaNYL  12.5 mcg Transdermal Q72H  . fluticasone  2 spray Each Nare Daily  . magic mouthwash w/lidocaine  10 mL Oral Q4H  . midazolam  2 mg Intravenous Once  . pantoprazole  40 mg Oral Daily  . predniSONE  10 mg Oral Q breakfast  . sodium chloride  10-40 mL Intracatheter Q12H  . sodium chloride  3 mL Intravenous Q12H   Continuous Infusions:    Principal Problem:   Septic shock Active Problems:   Hodgkin lymphoma   Myelodysplasia   Adrenal  abnormality   Rash and nonspecific skin eruption   DOE (dyspnea on exertion)   Symptomatic anemia   Melena   Adrenal infarction   Hypotension, unspecified   PNA (pneumonia)   Sepsis   Acute  renal failure   Pulmonary infiltrate   Fever    Time spent: 30 min    Evrett Hakim, [facility] Pager [phone]. If 7PM-7AM, please contact night-coverage at [email or web address], password [facility] 11/27/2013, 5:53 PM  LOS: 20 days

## 2013-11-27 NOTE — Consult Note (Signed)
Physical Medicine and Rehabilitation Consult  Reason for Consult: Deconditioning Referring Physician: Dr. Sheran Fava   HPI: Lacey [name] is a 58 y.o. female with history of Hodgkin's lymphoma s/p stem cell transplant with recurrence and myelodysplastic disorder who was admitted on 10/29/2013 with N/V, abdominal pain and flu type symptoms past 8 day [place] cruise. She was found to have pyelonephritis as well as purulent bronchitis and anemai. CT chest negative for PE and CT abdomen with L-adrenal infarct. Started on stress dose steriods for septic shock v/s adrenal crises. 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 Patient with progressive leucocytosis with recurrent fever and ID consulted for input and recommended Vancomycin/imipenem X 14 days.  CXR with RML persistent collapse and small effusions per U/S. She developed severe left hip pain on 11/20/13 due to left iliacs hemorrhage and thrombocytopenia treated with 2 units platelets. She continued to have intermittent fevers and continued on broad spectrum antibiotics and Voriconazole----to be d/c today. Dr. Benay Spice to start treatment for MDS once patient stronger.  She has had a drop in platelets today and to be transfused today. Patient with LLE weakness with inability to stand as well as diffuse weakness due to deconditioning. Rehab team recommending CIR.    Review of Systems  Constitutional: Negative for fever.  Gastrointestinal: Positive for abdominal pain.  Genitourinary: Positive for dysuria.  Musculoskeletal: Positive for back pain, joint pain and myalgias.  Neurological: Positive for tingling and sensory change. Negative for headaches.    Past Medical History  Diagnosis Date  . Cancer   . Cough productive of clear sputum 01/04/2013  . Fibromyalgia    Past Surgical History  Procedure Laterality Date  . Bone marrow biopsy  05/20110  . Spine surgery  1998    c-4 c 5 fusion  .  Hodgkin     Family History  Problem Relation Age of Onset  . Cancer Mother   . Cancer Father    Social History:  reports that she has never smoked. She has never used smokeless tobacco. She reports that she drinks alcohol. She reports that she does not use illicit drugs.   Allergies  Allergen Reactions  . Penicillins Hives and Rash  . Contrast Media [Iodinated Diagnostic Agents]     Medications Prior to Admission  Medication Sig Dispense Refill  . cholecalciferol (VITAMIN D) 1000 UNITS tablet Take 1,000 Units by mouth daily.      . DULoxetine (CYMBALTA) 60 MG capsule Take 60 mg by mouth every evening.      . gabapentin (NEURONTIN) 300 MG capsule Take 600 mg by mouth 2 (two) times daily.       Nyoka Cowden Tea 250 MG CAPS Take by mouth every morning.      Marland Kitchen amoxicillin (AMOXIL) 500 MG capsule Take 500 mg by mouth 3 (three) times daily. For 7 days.        Home: Home Living Family/patient expects to be discharged to:: Private residence Living Arrangements: Spouse/significant other;Parent Available Help at Discharge: Family Type of Home: House Home Access: Stairs to enter Technical brewer of Steps: 1 Entrance Stairs-Rails: None Home Layout: One level Frontier: Environmental consultant - 2 wheels  Functional History:   Functional Status:  Mobility:     Ambulation/Gait Ambulation Distance (Feet): 35 Feet General Gait Details: HR 121 at rest, 134 with walking, SaO2 92% on RA walking, 3/4 dyspnea with walking, VCs for pursed lip breathing, min/guard for safety  ADL: ADL Grooming: Set up Where Assessed - Grooming: Supported sitting Upper Body Bathing: Set up Where Assessed - Upper Body Bathing: Supported sit to stand Lower Body Bathing: +2 Total assistance Upper Body Dressing: Minimal assistance (lines) Where Assessed - Upper Body Dressing: Supported sitting Lower Body Dressing: +2 Total assistance Transfers/Ambulation Related to ADLs: pt was up in chair with lift pad under  her:  did not stand due to quad weakness with PT ADL Comments: educated on AE and adl assessed only from sitting level--educated that we will likely have to perform at bed level initially.  All of this weakness is new for her.  Also educated that we will work on sit to stand but we have other methods of movement we can try.  A mechanical lift was used for OOB by nursing.  Gave orange theraband for shoulder strengthening as she will have to compensate with UEs for LLE weakness.  Will spot check progress:  handouts given  Cognition: Cognition Overall Cognitive Status: Within Functional Limits for tasks assessed Orientation Level: Oriented X4 Cognition Arousal/Alertness: Awake/alert Behavior During Therapy: WFL for tasks assessed/performed Overall Cognitive Status: Within Functional Limits for tasks assessed  Blood pressure 198/92, pulse 98, temperature 97.6 F (36.4 C), temperature source Oral, resp. rate 18, height 5' (1.524 m), weight 101 kg (222 lb 10.6 oz), SpO2 98.00%. Physical Exam  Constitutional:  Obese   HENT:  Head: Normocephalic and atraumatic.  Eyes: Right eye exhibits discharge. Left eye exhibits no discharge.  Neck: No JVD present. No tracheal deviation present. No thyromegaly present.  Cardiovascular: Normal rate.   Respiratory: She is in respiratory distress.  GI: She exhibits no distension.  Lymphadenopathy:    She has no cervical adenopathy.  Neurological: No cranial nerve deficit.  UE strength 5/5. RLE is 3- HF, KE, 4/5 ankle. LLE is trace to 1/5 hip and knee. 3/5 at ankle. Sensation decreased around left leg  Psychiatric:  A little lethargic initially but improved during exam    Results for orders placed during the hospital encounter of 11/01/2013 (from the past 24 hour(s))  BASIC METABOLIC PANEL     Status: Abnormal   Collection Time    11/27/13  4:28 AM      Result Value Ref Range   Sodium 148 (*) 137 - 147 mEq/L   Potassium 3.1 (*) 3.7 - 5.3 mEq/L   Chloride  109  96 - 112 mEq/L   CO2 29  19 - 32 mEq/L   Glucose, Bld 97  70 - 99 mg/dL   BUN 30 (*) 6 - 23 mg/dL   Creatinine, Ser 1.08  0.50 - 1.10 mg/dL   Calcium 8.5  8.4 - 10.5 mg/dL   GFR calc non Af Amer 55 (*) >90 mL/min   GFR calc Af Amer 64 (*) >90 mL/min  CBC     Status: Abnormal   Collection Time    11/27/13  4:28 AM      Result Value Ref Range   WBC 15.9 (*) 4.0 - 10.5 K/uL   RBC 2.98 (*) 3.87 - 5.11 MIL/uL   Hemoglobin 8.5 (*) 12.0 - 15.0 g/dL   HCT 26.4 (*) 36.0 - 46.0 %   MCV 88.6  78.0 - 100.0 fL   MCH 28.5  26.0 - 34.0 pg   MCHC 32.2  30.0 - 36.0 g/dL   RDW 22.8 (*) 11.5 - 15.5 %   Platelets 28 (*) 150 - 400 K/uL  LACTATE DEHYDROGENASE  Status: Abnormal   Collection Time    11/27/13  4:28 AM      Result Value Ref Range   LDH 760 (*) 94 - 250 U/L   No results found.  Assessment/Plan: Diagnosis: deconditioning due to multiple medical above, left thigh pain/LLE edema 1. Does the need for close, 24 hr/day medical supervision in concert with the patient's rehab needs make it unreasonable for this patient to be served in a less intensive setting? Yes 2. Co-Morbidities requiring supervision/potential complications: lymphoma, sepsis, thrombocytopenia 3. Due to bladder management, bowel management, safety, skin/wound care, disease management, medication administration, pain management and patient education, does the patient require 24 hr/day rehab nursing? Yes 4. Does the patient require coordinated care of a physician, rehab nurse, PT (1-2 hrs/day, 5 days/week) and OT (1-2 hrs/day, 5 days/week) to address physical and functional deficits in the context of the above medical diagnosis(es)? Yes Addressing deficits in the following areas: balance, endurance, locomotion, strength, transferring, bowel/bladder control, bathing, dressing, feeding and grooming 5. Can the patient actively participate in an intensive therapy program of at least 3 hrs of therapy per day at least 5 days per  week? Yes and Potentially 6. The potential for patient to make measurable gains while on inpatient rehab is good 7. Anticipated functional outcomes upon discharge from inpatient rehab are supervision to mod I with PT, min assist to mod I with OT, n/a with SLP. 8. Estimated rehab length of stay to reach the above functional goals is: 12-18 days 9. Does the patient have adequate social supports to accommodate these discharge functional goals? Potentially 10. Anticipated D/C setting: Home 11. Anticipated post D/C treatments: [place] therapy 12. Overall Rehab/Functional Prognosis: excellent  RECOMMENDATIONS: This patient's condition is appropriate for continued rehabilitative care in the following setting: CIR Patient has agreed to participate in recommended program. Yes Note that insurance prior authorization may be required for reimbursement for recommended care.  Comment: Will need some sort of assistance at home beyond her 37 yo mother. Rehab Admissions Coordinator to follow up.  Thanks,  Meredith Staggers, MD, Mellody Drown     11/27/2013

## 2013-11-27 NOTE — Progress Notes (Signed)
Lacey [name] is slowly improving. Her platelet count is down again. I think we will have to transfuse her this so that we can have continued and regression of the left pelvic hematoma.  She still having pain over on the left hip. It's hard for her to walk because of this. I will go ahead and put her on a Duragesic patch. I think this is reasonable as chronic pain may be a problem secondary to this bleed.  Her blood pressure clearly is not a problem right now. She's off hydrocortisone and on prednisone.  Her right arm looks a little bit better. Not swollen.  Her Foley catheter was taken out. It is very hard for her to get to the bathroom to go to the bathroom. As such, I think a Foley might be necessary for right now. We can just use a leg bag.  Her husband was with [place] today. It is good to see him again. I updated him.  Her renal function continues to improve.  Her hemoglobin is stable. This hopefully is encouraging.  I really wants to get her onto treatment for the myelodysplasia. I probably would use decitabine. We also could consider Vidaza. However, something that she is strong enough yet.  I will increase her dose of Marinol to try to help with her appetite.  I think we can stop the vancomycin for right now. We also have to consider stopping the Maxipime for ID.  She is afebrile.  On physical exam, I really can't find anything different. Lungs are clear. Ulcerations in her mouth and healed. Cardiac exam regular rate and rhythm. Abdomen soft. Good bowel sounds. Slightly distended. There is still some tenderness in the left lower quadrant. Extremities shows improved edema on her right arm. There is some trace edema with her legs. She still has decreased range of motion of the left hip because of the pain. Skin exam no rashes.  We need to try and improve her functional state. Mr. properly this can be done because of the pain in the left hip. I will use a Duragesic patch. We'll see if this  helps.  I don't think she's able to go do inpatient rehabilitation. I don't know if skilled nursing would be able to manage all of her problems.  Pete E.  Romans 8:28

## 2013-11-27 NOTE — Progress Notes (Signed)
Clinical Social Work  Patient was discussed during progression meeting and it was reported that patient is not medically stable for DC at this time. Per chart review, patient was evaluated by CIR to determine if they could accept. CSW continues to follow and will assist with DC to SNF if [facility] is unable to accept.  [place],  [phone]

## 2013-11-27 NOTE — Progress Notes (Signed)
Occupational Therapy Treatment Patient Details Name: Lacey [name] MRN: [id number] DOB: 1956/08/25 Today's Date: 11/27/2013 Time: 7824-2353 OT Time Calculation (min): 23 min  OT Assessment / Plan / Recommendation  History of present illness 58 y.o. WF PMHx Hodgkin's lymphoma status post chemotherapy in the pas has myelodysplastic syndrome. The patient is coming from home. Patient presents with complaints of nausea vomiting and right-sided abdominal pain. The patient mentions that her symptoms started a month ago with cough and yellowish expectoration for which she completed a course of amoxicillin despite which her cough was not getting better.since last one month she started having dyspnea on exertion which is progressively worsening.denies any orthopnea or PND.   Spontaneous retroperitoneal bleed, now with L quad weakness.   OT comments  Pt with improving activity tolerance and improved functional mobility for BADLs.  She was able to move sit to stand x 2 with mod A with Lt. Knee blocked to prevent buckling.   Pt is very motivated.  Recommend CIR.   Follow Up Recommendations  CIR    Barriers to Discharge       Equipment Recommendations  3 in 1 bedside comode    Recommendations for Other Services    Frequency Min 2X/week   Progress towards OT Goals Progress towards OT goals: Progressing toward goals  Plan Discharge plan remains appropriate    Precautions / Restrictions Precautions Precautions: Fall Precaution Comments: L quads  are profoundly weak since retroperitoneal bleed. L LEG WILL BUCKLE   Pertinent Vitals/Pain     ADL  Lower Body Bathing: Maximal assistance Where Assessed - Lower Body Bathing: Unsupported sit to stand Lower Body Dressing: +1 Total assistance Where Assessed - Lower Body Dressing: Unsupported sit to stand Transfers/Ambulation Related to ADLs: mod A sit to stand with Lt. knee blocked ADL Comments: Pt moved to EOB. Worked on reaching off BOS. however, pt  very guarded due to pain.  worked on sit to stand in prep for LB ADLs    OT Diagnosis:    OT Problem List:   OT Treatment Interventions:     OT Goals(current goals can now be found in the care plan section) ADL Goals Pt Will Transfer to Toilet: squat pivot transfer;with transfer board;with +2 assist;bedside commode Additional ADL Goal #1: Pt will roll to bil sides with bedrails and min A for LB adls Additional ADL Goal #2: pt will sit unsupported eob for light adl activity with min guard x 5 minutes  Visit Information  Last OT Received On: 11/27/13 Assistance Needed: +2 History of Present Illness: 58 y.o. WF PMHx Hodgkin's lymphoma status post chemotherapy in the pas has myelodysplastic syndrome. The patient is coming from home. Patient presents with complaints of nausea vomiting and right-sided abdominal pain. The patient mentions that her symptoms started a month ago with cough and yellowish expectoration for which she completed a course of amoxicillin despite which her cough was not getting better.since last one month she started having dyspnea on exertion which is progressively worsening.denies any orthopnea or PND.   Spontaneous retroperitoneal bleed, now with L quad weakness.    Subjective Data      Prior Functioning       Cognition  Cognition Arousal/Alertness: Awake/alert Behavior During Therapy: WFL for tasks assessed/performed Overall Cognitive Status: Within Functional Limits for tasks assessed    Mobility  Bed Mobility Overal bed mobility: Needs Assistance Bed Mobility: Supine to Sit Supine to sit: Mod assist General bed mobility comments: Assist for Lt. LE and mn  A to lift shoulders Transfers Overall transfer level: Needs assistance Equipment used: Rolling walker (2 wheeled) Transfers: Sit to/from [facility] Sit to Stand: Mod assist Stand pivot transfers: Mod assist;+2 physical assistance;+2 safety/equipment General transfer comment: Sit to stand  x 2 with Lt. knee blocked    Exercises      Balance Balance Overall balance assessment: Needs assistance Sitting-balance support: Feet supported;No upper extremity supported Sitting balance-Leahy Scale: Fair  End of Session OT - End of Session Activity Tolerance: Patient tolerated treatment well Patient left: in bed;Other (comment) (with PT)  GO     Damarrion Mimbs M 11/27/2013, 3:48 PM

## 2013-11-27 NOTE — Progress Notes (Signed)
CRITICAL VALUE ALERT  Critical value received:  Platelets 28  Date of notification:  11/27/13  Time of notification: 0555  Critical value read back: yes  Nurse who received alert:  Tyson Babinski  MD notified (1st page):  Joneen Caraway, [place]  Time of first page:  [phone]  Responding MD: Joneen Caraway  Time MD responded: 0600  New Orders Received

## 2013-11-27 NOTE — Progress Notes (Signed)
Physical Therapy Treatment Patient Details Name: Lacey [name] MRN: [id number] DOB: [birth year] Today's Date: 11/27/2013 Time: 1355-1405 PT Time Calculation (min): 10 min  PT Assessment / Plan / Recommendation  History of Present Illness 58 y.o. WF PMHx Hodgkin's lymphoma status post chemotherapy in the pas has myelodysplastic syndrome. The patient is coming from home. Patient presents with complaints of nausea vomiting and right-sided abdominal pain. The patient mentions that her symptoms started a month ago with cough and yellowish expectoration for which she completed a course of amoxicillin despite which her cough was not getting better.since last one month she started having dyspnea on exertion which is progressively worsening.denies any orthopnea or PND.   Spontaneous retroperitoneal bleed, now with L quad weakness.   PT Comments   Pt assisted to recliner taking a couple steps with RW and +2 assist due to L LE buckling.  Pt fatigued quickly due to just working with OT prior to PT entering room so requested resting sitting up in recliner.   Follow Up Recommendations  CIR;Supervision/Assistance - 24 hour     Does the patient have the potential to tolerate intense rehabilitation     Barriers to Discharge        Equipment Recommendations  None recommended by PT (tbd if d/c home)    Recommendations for Other Services    Frequency Min 3X/week   Progress towards PT Goals Progress towards PT goals: Progressing toward goals  Plan Current plan remains appropriate    Precautions / Restrictions Precautions Precautions: Fall Precaution Comments: L quads  are profoundly weak since retroperitoneal bleed. L LEG WILL BUCKLE   Pertinent Vitals/Pain SpO2 94% room air after transferring to recliner    Mobility  Bed Mobility General bed mobility comments: sitting EOB with OT on arrival Transfers Overall transfer level: Needs assistance Equipment used: Rolling walker (2  wheeled) Transfers: Sit to/from [facility] Sit to Stand: +2 physical assistance;+2 safety/equipment;Mod assist Stand pivot transfers: Mod assist;+2 physical assistance;+2 safety/equipment General transfer comment: pt assisted to standing using 2 HHA and blocking L LE, pt tolerated well and able to support weight once standing, pt agreeable to try using RW and again assisted to standing, L LE buckling observed 2-3 times however had +2 assist for support and safety, pt able to take a couple steps forwards and then pivot with recliner brought behind pt due to fatigue, pt able to lift L LE off floor in standing however unable to in sitting    Exercises     PT Diagnosis:    PT Problem List:   PT Treatment Interventions:     PT Goals (current goals can now be found in the care plan section)    Visit Information  Last PT Received On: 11/27/13 Assistance Needed: +2 History of Present Illness: 58 y.o. WF PMHx Hodgkin's lymphoma status post chemotherapy in the pas has myelodysplastic syndrome. The patient is coming from home. Patient presents with complaints of nausea vomiting and right-sided abdominal pain. The patient mentions that her symptoms started a month ago with cough and yellowish expectoration for which she completed a course of amoxicillin despite which her cough was not getting better.since last one month she started having dyspnea on exertion which is progressively worsening.denies any orthopnea or PND.   Spontaneous retroperitoneal bleed, now with L quad weakness.    Subjective Data      Cognition  Cognition Arousal/Alertness: Awake/alert Behavior During Therapy: Anxious Overall Cognitive Status: Within Functional Limits for tasks assessed  Balance     End of Session PT - End of Session Equipment Utilized During Treatment: Gait belt Activity Tolerance: Patient limited by fatigue;Patient limited by pain Patient left: in chair;with call bell/phone within reach    GP     Adena Sima,KATHrine E 11/27/2013, 3:08 PM Carmelia Bake, PT, DPT 11/27/2013 Pager: [phone]

## 2013-11-28 DIAGNOSIS — I5033 Acute on chronic diastolic (congestive) heart failure: Secondary | ICD-10-CM

## 2013-11-28 DIAGNOSIS — M79609 Pain in unspecified limb: Secondary | ICD-10-CM

## 2013-11-28 LAB — CBC
HEMATOCRIT: 26.9 % — AB (ref 36.0–46.0)
Hemoglobin: 8.7 g/dL — ABNORMAL LOW (ref 12.0–15.0)
MCH: 28.5 pg (ref 26.0–34.0)
MCHC: 32.3 g/dL (ref 30.0–36.0)
MCV: 88.2 fL (ref 78.0–100.0)
Platelets: 62 10*3/uL — ABNORMAL LOW (ref 150–400)
RBC: 3.05 MIL/uL — ABNORMAL LOW (ref 3.87–5.11)
RDW: 22.6 % — ABNORMAL HIGH (ref 11.5–15.5)
WBC: 17.6 10*3/uL — AB (ref 4.0–10.5)

## 2013-11-28 LAB — PREPARE PLATELET PHERESIS: Unit division: 0

## 2013-11-28 LAB — LACTATE DEHYDROGENASE: LDH: 706 U/L — ABNORMAL HIGH (ref 94–250)

## 2013-11-28 LAB — PLATELET COUNT: PLATELETS: 79 10*3/uL — AB (ref 150–400)

## 2013-11-28 LAB — BASIC METABOLIC PANEL
BUN: 24 mg/dL — ABNORMAL HIGH (ref 6–23)
CO2: 31 meq/L (ref 19–32)
CREATININE: 0.92 mg/dL (ref 0.50–1.10)
Calcium: 8.4 mg/dL (ref 8.4–10.5)
Chloride: 104 mEq/L (ref 96–112)
GFR calc Af Amer: 77 mL/min — ABNORMAL LOW (ref >90)
GFR calc non Af Amer: 67 mL/min — ABNORMAL LOW (ref >90)
Glucose, Bld: 109 mg/dL — ABNORMAL HIGH (ref 70–99)
Potassium: 3.2 mEq/L — ABNORMAL LOW (ref 3.7–5.3)
SODIUM: 145 meq/L (ref 137–147)

## 2013-11-28 MED ORDER — PROMETHAZINE HCL 25 MG/ML IJ SOLN
12.5000 mg | Freq: Four times a day (QID) | INTRAMUSCULAR | Status: DC | PRN
  Administered 2013-11-28: 12.5 mg via INTRAVENOUS
  Filled 2013-11-28: qty 1

## 2013-11-28 MED ORDER — LIDOCAINE VISCOUS 2 % MT SOLN
15.0000 mL | OROMUCOSAL | Status: DC | PRN
  Administered 2013-12-01: 15 mL via OROMUCOSAL
  Filled 2013-11-28: qty 15

## 2013-11-28 MED ORDER — OXYCODONE HCL 5 MG PO TABS
5.0000 mg | ORAL_TABLET | ORAL | Status: DC | PRN
  Administered 2013-11-28 – 2013-11-29 (×2): 10 mg via ORAL
  Administered 2013-11-29: 5 mg via ORAL
  Administered 2013-11-30: 10 mg via ORAL
  Filled 2013-11-28 (×2): qty 2
  Filled 2013-11-28: qty 1
  Filled 2013-11-28: qty 2

## 2013-11-28 MED ORDER — FUROSEMIDE 10 MG/ML IJ SOLN
40.0000 mg | Freq: Every day | INTRAMUSCULAR | Status: DC
  Administered 2013-11-28 – 2013-11-30 (×3): 40 mg via INTRAVENOUS
  Filled 2013-11-28 (×3): qty 4

## 2013-11-28 MED ORDER — POTASSIUM CHLORIDE CRYS ER 20 MEQ PO TBCR
40.0000 meq | EXTENDED_RELEASE_TABLET | Freq: Every day | ORAL | Status: DC
  Filled 2013-11-28: qty 2

## 2013-11-28 NOTE — Progress Notes (Signed)
Clinical Social Work  CSW discussed case with [facility] admissions coordinator who reports she will talk with family to obtain further information. CSW continues to follow and will assist with SNF placement at [facility] if [facility] is unable to accept.  [place], [place] [phone]

## 2013-11-28 NOTE — Progress Notes (Signed)
Sitting in chair this AM.  Has more welling in legs.  Doppler is (-).  She still may have volume overload.Is on Lasix.  CXR is stable.  BP is ok.  Is only on prednisone 5mg  qday. Is off antibiotics. Still having significant pain in left hip due to bleed.  Platelet count is 62K post transfusion.  Very hard to do any PT.  Appetite is marginal.  On Marinol.  No N/V.  On her PE, No real changes.    She still is too weak for any chemo for the MDS.  Trying hard to get her stronger.    Lacey E.

## 2013-11-28 NOTE — Progress Notes (Signed)
Inpt. Rehab  Met with patient at the bedside to discuss her post acute rehab options.  Pt. Was not able to clearly state her desires for rehab.  Dr. Marin Olp commented in his note from yesterday that he doesn't believe the patient can go and do rehab, and that he wants to get her onto treatment for her myelodysplasia.  I have placed a call to him and await a response to be clear on medical plans for this patient in the short run before I request insurance authorization for admission.  Will follow up once I have communicated with him.  Call if questions.    [place] Admissions Coordinator Cell [phone] Office [phone]

## 2013-11-28 NOTE — Progress Notes (Signed)
Bilateral lower extremity venous duplex:  No evidence of DVT, superficial thrombosis, or Baker's Cyst.   

## 2013-11-28 NOTE — Progress Notes (Signed)
[facility] PROGRESS NOTE  Lacey [name] DZH:[id number] DOB: October 14, 1955 DOA: 11/04/2013 PCP: Vena Austria, MD  Brief summary  58 y.o. female with history of Hodgkin's lymphoma status post chemotherapy now with myelodysplastic syndrome who presented with abdominal pain, nausea, vomiting, hypotension. 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 She was subsequently diagnosed with PNA.  Due to severity of illness and persistent right upper lobe collapse, she received 14 days of IV antibiotics.  She also developed hemorrhage into her left iliopsoas muscle which caused weakness and pain of her left leg and LLQ.  She started to clinically improve, however, a day or two after her antibiotics were discontinued she developed septic vs. Adrenal shock and had to be transferred to the ICU again for vasopressor support.  She was restarted on broad spectrum abx and stress dose steroids.  ID was consulted and PCCM followed.  She underwent bronchoscopy, however, all cultures were negative.  She will have completed an additional 7 days of antibiotics on 3/3.  Her course has been complicated by AKI which has now resolved, however, she is hypervolemic still so she is getting diuresed with IV lasix.  Additionally, Dr. Marin Olp is determining what treatment to initiate for her MDS as she has required frequent transfusions.  She would benefit from rehab at SNF once medically stable.      Assessment/Plan  Myelodysplastic syndrome in setting of Hodgkin Lymphoma:  Refractory anemia and thrombocytopenia requiring almost daily blood product transfusions, Bone Marrow Biopsy: HYPERCELLULAR BONE MARROW FOR AGE WITH DYSPOIETIC CHANGES. -  Appreciate Dr Jonette Eva assistance.  -  Cannot continue to perform daily transfusions as patient is developing antibodies.  Need treatment solution or palliative care plan.  Defer to Oncology.    Recurrent fever/septic shock with unknown source, resolving on  broad spectrum abx.  CT chest on 2/23:  Persistent consolidation RML with new peribronchovascular opacities suggestive of bronchopneumonia.  Underwent bronch on 2/26 -  Repeat BCx NGTD -  BAL:  PCP neg, Bacterial, fungal, and AFB cultures NGTD -  UA:  Neg -  Continue vanc and cefepime through 3/3, then stop -  C. Diff PCR neg -  Consider exchanging PICC line   Acute on chronic diastolic heart failure due to IVF from resuscitation.  Preserved EF and grade 1 diastolic heart failure, improving  -  CXR this AM, stable with persistent fluid in fissue -  Continue daily lasix, -2L yesterday -  Daily weights and strict I/O  Elevated BPs, may be lingering effect of steroids, pain.  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  RUS wnl. Hypervolemic -  Continue lasix IV daily  Spontaneous retroperitoneal bleed in setting of thrombocytopenia, resolving.   -  Started fentanyl patch on 3/2 -  Continue ultram, but consider escalating if patient desires  Right upper extremity superficial thrombosis, stable  -  Not on ac due to thrombocytopenia  Transaminitis due to septic shock, resolved.    Urge and stress incontinence -  Foley placed for comfort  Hypokalemia due to lasix -  Start daily oral repletion  Diet:  regular Access:  PICC IVF:  on Proph:  Hold for thrombocytopenia, spont retroperitoneal bleed  Code Status: full Family Communication: patient alone Disposition Plan:  Clinically improving.  PT/OT  recommending CIR vs. SNF.  Need plan for marrow failure, however, because she is requiring frequent blood and plt transfusions.    Consultants:  Dr. Burney Gauze (oncology)  Dr Gibson Ramp (Surgery) curbside consult  Dr. Carlyle Basques (infectious disease)  Procedures:  11/12/2013 right  upper extremity Doppler  No evidence of deep vein thrombosis involving the right upper extremity, left subclavian vein, and left internal jugular vein. There is superficial thrombosis noted in the right cephalic vein.  -3/47 Bone marrow biopsy report pending  Echocardiogram 01/10/2013  - Left ventricle: mild focal basal hypertrophy of the septum.  -LVEF= ejection fraction was 55%. Wall motion was normal; there were no regional wall motion abnormalities.  -(grade 1 diastolic dysfunction).  - Aortic valve: There was no stenosis. Trivial regurgitation. - Mitral valve: Trivial regurgitation. - Left atrium: The atrium was mildly dilated. - Right ventricle: The cavity size was normal. Systolic function was normal. - Tricuspid valve: Peak RV-RA gradient: 18m Hg (S). - Pulmonary arteries: PA peak pressure: 235mHg (S). - Inferior vena cava: The vessel was normal in size; the respirophasic diameter changes were in the normal range (=50%); findings are consistent with normal central venous pressure.  Antibiotics:  Aztreonam 2/11>> stopped 2/13  Ciprofloxacin 2/11>> stopped 2/15  Vancomycin 2/11>> stopped 2/15  Primaxin>> 2/16>> 2/21 Voriconazole 2/16>> stopped 2/18, restarted 2/26 Vancomycin 2/17>> 2/24, restarted 2/26 Cefepime 2/25 >>  HPI/Subjective:  Denies nausea.  Coughing, but less wheezy today.  Persistent pain LLQ and left hip.  Unable to move left leg much ,. Bit tongue  Objective: Filed Vitals:   11/27/13 2148 11/27/13 2303 11/28/13 0529 11/28/13 1500  BP: 145/88 132/66 153/89 151/98  Pulse: 107 116  111  Temp: 98.2 F (36.8 C) 98.5 F (36.9 C) 97.9 F (36.6 C) 98.8 F (37.1 C)  TempSrc: Oral Oral Oral Oral  Resp: _0 Height:      Weight:      SpO2: 96% 97% 97% 92%    Intake/Output Summary (Last 24 hours) at 11/28/13 1827 Last data filed at 11/28/13 1503  Gross per 24 hour  Intake    876 ml  Output   2850 ml  Net  -1974 ml   Filed Weights   11/22/13  0500 11/25/13 0400 11/26/13 0736  Weight: 92.9 kg (204 lb 12.9 oz) 98.5 kg (217 lb 2.5 oz) 101 kg (222 lb 10.6 oz)    Exam:   General:  CF, No acute distress  HEENT:  NCAT, MMM, tongue with bruising right side  Cardiovascular:  RRR, nl S1, S2 no mrg, 2+ pulses, warm extremities  Respiratory:  Better aeration and decreased wheeze, no rales or rhonchi, no increased WOB  Abdomen:   NABS, soft, mildly distended, mild TTP particularly in LLQ without rebound or guarding  MSK:   Normal tone and bulk, 2+ RUE edema, 2+ LUE, and bilateral LE edema pitting.  PICC left arm  Neuro:  Unable to flex left hip due to pain and weakness, otherwise intact  Psych:  Alert and cooperative  Data Reviewed: Basic Metabolic Panel:  Recent Labs Lab 11/22/13 0338  11/11/2013 0320 11/24/13 0530 11/25/13 0414 11/26/13 0526 11/27/13 0428 11/28/13 0510  NA 142  < > 143 144 144 146 148* 145  K 3.2*  < > 3.7 3.1* 4.2 3.9 3.1* 3.2*  CL 99  < > 103 105 105 106 109 104  CO2 33*  < > _1 GLUCOSE  109*  < > 109* 172* 146* 130* 97 109*  BUN 22  < > 31* 32* 32* 37* 30* 24*  CREATININE 1.55*  < > 2.60* 2.06* 1.77* 1.43* 1.08 0.92  CALCIUM 7.9*  < > 7.6* 7.5* 8.3* 8.4 8.5 8.4  MG 1.5  --  1.2* 1.7 2.4  --   --   --   < > = values in this interval not displayed. Liver Function Tests:  Recent Labs Lab 11/22/13 0338 11/19/2013 0320 11/24/13 0530 11/25/13 0414  AST 26 105* 55* 34  ALT 15 39* 40* 40*  ALKPHOS 57 65 61 68  BILITOT 0.9 1.1 0.4 0.4  PROT 5.4* 5.4* 5.5* 6.6  ALBUMIN 2.1* 2.0* 1.8* 2.2*   No results found for this basename: LIPASE, AMYLASE,  in the last 168 hours  Recent Labs Lab 11/22/13 1815  AMMONIA 19   CBC:  Recent Labs Lab 11/22/13 0338  11/22/13 2356 11/06/2013 0320 11/24/13 0530  11/25/13 0414 11/26/13 0526 11/27/13 0428 11/27/13 2337 11/28/13 0510  WBC 13.5*  < > 15.0* 19.7* 14.7*  --  15.9* 14.5* 15.9*  --  17.6*  NEUTROABS 9.3*  --  12.0* 15.5* 10.6*  --   12.8*  --   --   --   --   HGB 6.3*  < > 7.6* 7.5* 7.0*  --  8.3* 8.5* 8.5*  --  8.7*  HCT 19.7*  < > 23.4* 22.9* 21.0*  --  25.7* 26.1* 26.4*  --  26.9*  MCV 94.3  < > 88.6 88.4 86.8  --  87.1 88.2 88.6  --  88.2  PLT 32*  < > PLATELET CLUMPS NOTED ON SMEAR, COUNT APPEARS DECREASED 36* 23*  < > 70* 36* 28* 79* 62*  < > = values in this interval not displayed. Cardiac Enzymes:  Recent Labs Lab 11/22/13 1815  TROPONINI <0.30   BNP (last 3 results)  Recent Labs  11/14/13 0435 11/17/13 0800 11/20/13 0515  PROBNP 2977.0* 9541.0* 3178.0*   CBG: No results found for this basename: GLUCAP,  in the last 168 hours  Recent Results (from the past 240 hour(s))  VIRAL CULTURE VIRC     Status: None   Collection Time    11/22/13 11:46 AM      Result Value Ref Range Status   Specimen Description ULCER MOUTH   Final   Special Requests Immunocompromised   Final   Culture     Final   Value: CONTINUING TO HOLD     Note:    The current Enterovirus culture system does not detect Enterovirus D68. If Enterovirus D68 is suspected, please call client services to add the test for Enterovirus PCR (Test code [phone]).     Performed at [facility]   Report Status PENDING   Incomplete  CLOSTRIDIUM DIFFICILE BY PCR     Status: None   Collection Time    11/22/13  7:34 PM      Result Value Ref Range Status   C difficile by pcr NEGATIVE  NEGATIVE Final   Comment: Performed at [place], BLOOD (SINGLE)     Status: None   Collection Time    11/22/13 11:30 PM      Result Value Ref Range Status   Specimen Description BLOOD PIC LINE   Final   Special Requests BOTTLES DRAWN AEROBIC AND ANAEROBIC   Final   Culture  Setup Time     Final   Value:  11/13/2013 04:04     Performed at [facility]   Culture     Final   Value:        BLOOD CULTURE RECEIVED NO GROWTH TO DATE CULTURE WILL BE HELD FOR 5 DAYS BEFORE ISSUING A FINAL NEGATIVE REPORT     Performed at [facility]   Report Status PENDING   Incomplete  AFB CULTURE WITH SMEAR     Status: None   Collection Time    11/04/2013  2:54 PM      Result Value Ref Range Status   Specimen Description BRONCHIAL ALVEOLAR LAVAGE   Final   Special Requests Immunocompromised   Final   ACID FAST SMEAR     Final   Value: NO ACID FAST BACILLI SEEN     Performed at [facility]   Culture     Final   Value: CULTURE WILL BE EXAMINED FOR 6 WEEKS BEFORE ISSUING A FINAL REPORT     Performed at [facility]   Report Status PENDING   Incomplete  CULTURE, BAL-QUANTITATIVE     Status: None   Collection Time    11/03/2013  2:54 PM      Result Value Ref Range Status   Specimen Description BRONCHIAL ALVEOLAR LAVAGE   Final   Special Requests Immunocompromised   Final   Gram Stain     Final   Value: FEW WBC PRESENT, PREDOMINANTLY PMN     NO SQUAMOUS EPITHELIAL CELLS SEEN     NO ORGANISMS SEEN     Performed at [place]     Final   Value: NO GROWTH     Performed at [facility]   Culture     Final   Value: NO GROWTH 2 DAYS     Performed at [facility]   Report Status 11/26/2013 FINAL   Final  FUNGUS CULTURE W SMEAR     Status: None   Collection Time    11/05/2013  2:54 PM      Result Value Ref Range Status   Specimen Description BRONCHIAL WASHINGS   Final   Special Requests Immunocompromised   Final   Fungal Smear     Final   Value: NO YEAST OR FUNGAL ELEMENTS SEEN     Performed at [facility]   Culture     Final   Value: CULTURE IN PROGRESS FOR FOUR WEEKS     Performed at [facility]   Report Status PENDING   Incomplete  PNEUMOCYSTIS JIROVECI SMEAR BY DFA     Status: None   Collection Time    11/22/2013  2:54 PM      Result Value Ref Range Status   Specimen Source-[facility] BRONCHIAL ALVEOLAR LAVAGE   Final   Pneumocystis jiroveci Ag NEGATIVE   Final   Comment: Performed at [facility]  LEGIONELLA CULTURE     Status:  None   Collection Time    11/12/2013  2:54 PM      Result Value Ref Range Status   Specimen Description BRONCHIAL WASHINGS   Final   Special Requests Immunocompromised   Final   Culture     Final   Value: NO LEGIONELLA ISOLATED, CULTURE IN PROGRESS FOR 5 DAYS     Performed at [facility]   Report Status PENDING   Incomplete     Studies: Dg Chest 2 View  11/27/2013  CLINICAL DATA:  Soreness of breath.  Fever and cough  EXAM: CHEST  2 VIEW  COMPARISON:  11/24/2013  FINDINGS: There is a left arm PICC line with tip in the projection of the SVC. The heart size appears normal. Right middle lobe atelectasis is unchanged from previous examination. The right upper lobe perihilar opacity is also stable. There is increased fluid within the major fissure of the right lung. Left lung is relatively clear.  IMPRESSION: 1. No significant change compared with previous exam   Electronically Signed   By: Kerby Moors M.D.   On: 11/27/2013 09:31    Scheduled Meds: . antiseptic oral rinse  15 mL Mouth Rinse q12n4p  . chlorhexidine  15 mL Mouth Rinse BID  . dronabinol  5 mg Oral BID AC  . DULoxetine  60 mg Oral QPM  . feeding supplement (ENSURE COMPLETE)  237 mL Oral TID WC  . fentaNYL  12.5 mcg Transdermal Q72H  . fluticasone  2 spray Each Nare Daily  . furosemide  40 mg Intravenous Daily  . midazolam  2 mg Intravenous Once  . pantoprazole  40 mg Oral Daily  . [START ON 11/29/2013] potassium chloride  40 mEq Oral Daily  . predniSONE  5 mg Oral Q breakfast  . sodium chloride  10-40 mL Intracatheter Q12H  . sodium chloride  3 mL Intravenous Q12H   Continuous Infusions:    Principal Problem:   Septic shock Active Problems:   Hodgkin lymphoma   Myelodysplasia   Adrenal abnormality   Rash and nonspecific skin eruption   DOE (dyspnea on exertion)   Symptomatic anemia   Melena   Adrenal infarction   Hypotension, unspecified   PNA (pneumonia)   Sepsis   Acute renal failure   Pulmonary  infiltrate   Fever   Acute on chronic diastolic heart failure    Time spent: 30 min    Rigoberto Repass, [place]  [facility] Pager [phone]. If 7PM-7AM, please contact night-coverage at [email or web address], password [facility] 11/28/2013, 6:27 PM  LOS: 21 days

## 2013-11-29 LAB — CBC
HEMATOCRIT: 24.9 % — AB (ref 36.0–46.0)
HEMOGLOBIN: 7.9 g/dL — AB (ref 12.0–15.0)
MCH: 28.4 pg (ref 26.0–34.0)
MCHC: 31.7 g/dL (ref 30.0–36.0)
MCV: 89.6 fL (ref 78.0–100.0)
Platelets: 25 10*3/uL — CL (ref 150–400)
RBC: 2.78 MIL/uL — AB (ref 3.87–5.11)
RDW: 22.1 % — ABNORMAL HIGH (ref 11.5–15.5)
WBC: 16.4 10*3/uL — ABNORMAL HIGH (ref 4.0–10.5)

## 2013-11-29 LAB — CULTURE, BLOOD (SINGLE): CULTURE: NO GROWTH

## 2013-11-29 LAB — LEGIONELLA CULTURE

## 2013-11-29 LAB — BASIC METABOLIC PANEL
BUN: 20 mg/dL (ref 6–23)
CHLORIDE: 101 meq/L (ref 96–112)
CO2: 31 mEq/L (ref 19–32)
Calcium: 8.3 mg/dL — ABNORMAL LOW (ref 8.4–10.5)
Creatinine, Ser: 1.03 mg/dL (ref 0.50–1.10)
GFR calc Af Amer: 68 mL/min — ABNORMAL LOW (ref >90)
GFR, EST NON AFRICAN AMERICAN: 58 mL/min — AB (ref >90)
GLUCOSE: 145 mg/dL — AB (ref 70–99)
POTASSIUM: 3 meq/L — AB (ref 3.7–5.3)
SODIUM: 142 meq/L (ref 137–147)

## 2013-11-29 LAB — PREALBUMIN: Prealbumin: 17 mg/dL — ABNORMAL LOW (ref 17.0–34.0)

## 2013-11-29 LAB — LACTATE DEHYDROGENASE: LDH: 598 U/L — AB (ref 94–250)

## 2013-11-29 MED ORDER — POTASSIUM CHLORIDE 10 MEQ/50ML IV SOLN
10.0000 meq | INTRAVENOUS | Status: AC
  Administered 2013-11-29 (×5): 10 meq via INTRAVENOUS
  Filled 2013-11-29 (×5): qty 50

## 2013-11-29 MED ORDER — POTASSIUM CHLORIDE CRYS ER 20 MEQ PO TBCR
40.0000 meq | EXTENDED_RELEASE_TABLET | Freq: Two times a day (BID) | ORAL | Status: DC
  Administered 2013-11-29 – 2013-12-02 (×6): 40 meq via ORAL
  Filled 2013-11-29 (×7): qty 2

## 2013-11-29 MED ORDER — FUROSEMIDE 10 MG/ML IJ SOLN
40.0000 mg | Freq: Once | INTRAMUSCULAR | Status: DC
  Filled 2013-11-29: qty 4

## 2013-11-29 MED ORDER — MORPHINE SULFATE 4 MG/ML IJ SOLN
4.0000 mg | INTRAMUSCULAR | Status: DC | PRN
  Administered 2013-12-01 – 2013-12-02 (×3): 4 mg via INTRAVENOUS
  Filled 2013-11-29 (×3): qty 1

## 2013-11-29 NOTE — Progress Notes (Signed)
Inpt.  Rehab  Have reviewed Dr. Antonieta Pert notes from 3/3 and 3/4.  Note pt's critical platelet level this am.  Pt. Is not medically ready for IP rehab.  We will follow her progress.  I will be off 3/5 and 3/6.  If questions arise, please contact my co-worker Karene Fry at [phone].   [place] Admissions Coordinator Cell [phone] Office [phone]

## 2013-11-29 NOTE — Progress Notes (Signed)
Ms. Rake is now complaining of pain in her right upper abdomen. She thinks it does happen with coughing. There is no obvious shortness of breath.  She is not complaining of as much pain in the left lower quadrant. She still not really able to walk that much.  There is no DVT in her legs. She still pretty have a lot of urine. Her blood pressure is still holding steady. No bleeding.  Her appetite might be a little better.  Her pain hopefully is a little better. She is on a Duragesic patch.  Her platelet count cut down to 25,000. Hemoglobin 7.9. I would not transfuse her. Her potassium is 3.0. We may have to give her some more potassium.  She still not strong enough to take any therapy for the myelodysplasia. We have tried to focus on her strength and her performance status. Currently, her performance status is ECOG 3.  On her physical exam, temperature is 99.3. Blood pressure 147/73. Also 108. Oral exam shows the ulcer on the right side of her tongue to be healing. Lungs are clear with no wheezes. Cardiac exam  tachycardic but regular. shows no murmurs. Abdomen is soft. There is some tenderness in the right upper quadrant. I cannot palpate her liver. Extremities shows 1+ edema of her legs. The edema in her right arm appears much better. Neurological exam no focal neurological deficits.  Again, I just am not able to treat her now as her performance status is just not enough to handle even mild chemotherapy. We just have to get her stronger of possible.  Ultimately, some kind of rehabilitation is going to be indicated.  We'll see about a little more diuresis.  I need to see what her prealbumin is.

## 2013-11-29 NOTE — Progress Notes (Signed)
[facility] PROGRESS NOTE  Lacey [name] EPP:[id number] DOB: September 16, 1956 DOA: 11/10/2013 PCP: Vena Austria, MD  Brief summary  58 y.o. female with history of Hodgkin's lymphoma status post chemotherapy now with myelodysplastic syndrome who presented with abdominal pain, nausea, vomiting, hypotension.  -she was found to have adrenal infarct, adrenal insufficiency, HCAP, sepsis, hypotension,  She also developed hemorrhage into her left iliopsoas muscle which caused weakness and pain of her left leg and LLQ. - She started to clinically improve, however, a day or two after her antibiotics were discontinued she developed septic vs. Adrenal shock and had to be transferred to the ICU again for vasopressor support. She was restarted on broad spectrum abx and stress dose steroids. ID was consulted and PCCM followed. She underwent bronchoscopy, however, all cultures were negative. She will have completed an additional 7 days of antibiotics on 3/3. Her course has been complicated by AKI which has now resolved, however, she is hypervolemic still so she is getting diuresed with IV lasix. Additionally, Dr. Marin Olp is determining what treatment to initiate for her MDS as she has required frequent transfusions. She would benefit from rehab at SNF once medically stable.   Assessment/Plan  1. Myelodysplastic syndrome in setting of Hodgkin Lymphoma: Refractory anemia and thrombocytopenia requiring almost daily blood product transfusions, Bone Marrow Biopsy: HYPERCELLULAR BONE MARROW FOR AGE WITH DYSPOIETIC CHANGES.  - Appreciate Dr Jonette Eva assistance.  - Cannot continue to perform daily transfusions as patient is developing antibodies. Need treatment solution or palliative care plan. Defer to Oncology.   2. Recurrent fever/septic shock with unknown source, resolving on broad spectrum abx. CT chest on 2/23: Persistent consolidation RML with new peribronchovascular opacities suggestive of  bronchopneumonia. Underwent bronch on 2/26  - Repeat BCx NGTD  - BAL: PCP neg, Bacterial, fungal, and AFB cultures NGTD  - UA: Neg  - Continue vanc and cefepime through 3/3, then stop  - C. Diff PCR neg  - Consider exchanging PICC line  3. Acute on chronic diastolic heart failure due to IVF from resuscitation. Echo (2/15): Preserved EF and grade 1 diastolic heart failure, improving  - Continue daily lasix, Daily weights and strict I/O  4. Elevated BPs, may be lingering effect of steroids, pain. No previous hx of HTN  - Continue hydralazine prn  - Control pain  - Start daily lasix  4. 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 Acute Renal Failure likely due to septic shock, resolved. RUS wnl. Hypervolemic  - Continue lasix IV daily  6. Spontaneous retroperitoneal bleed in setting of thrombocytopenia; hemorrhage into her left iliopsoas muscle which caused weakness and pain of her left leg and LLQ - Started fentanyl patch on 3/2  - Continue ultram, but consider escalating if patient desires  7. Right upper extremity superficial thrombosis, stable  - Not on ac due to thrombocytopenia  8. Transaminitis due to septic shock, resolved.  9. Urge and stress incontinence  - Foley placed for comfort  10. Hypokalemia due to lasix  - Start daily oral repletion   Diet: regular  Access: PICC  IVF: on  Proph: Hold for thrombocytopenia, spont retroperitoneal bleed  Code Status: full  Family Communication: patient alone  Disposition Plan: Clinically improving. PT/OT recommending CIR vs. SNF. Need plan for marrow failure, however, because she is requiring frequent blood and plt transfusions.  Consultants:  Dr. PBurney Gauze(oncology)  Dr PGibson Ramp(Surgery) curbside consult  Dr. CCarlyle Basques(infectious disease)  Procedures:  11/12/2013 right upper extremity Doppler  No evidence of deep vein thrombosis involving the right upper extremity,  left subclavian vein, and left internal jugular vein. There is superficial thrombosis noted in the right cephalic vein.  -5/46 Bone marrow biopsy report pending  Echocardiogram 01/10/2013  - Left ventricle: mild focal basal hypertrophy of the septum.  -LVEF= ejection fraction was 55%. Wall motion was normal; there were no regional wall motion abnormalities.  -(grade 1 diastolic dysfunction).  - Aortic valve: There was no stenosis. Trivial regurgitation. - Mitral valve: Trivial regurgitation. - Left atrium: The atrium was mildly dilated. - Right ventricle: The cavity size was normal. Systolic function was normal. - Tricuspid valve: Peak RV-RA gradient: 35m Hg (S). - Pulmonary arteries: PA peak pressure: 262mHg (S). - Inferior vena cava: The vessel was normal in size; the respirophasic diameter changes were in the normal range (=50%); findings are consistent with normal central venous pressure.  Antibiotics:  Aztreonam 2/11>> stopped 2/13  Ciprofloxacin 2/11>> stopped 2/15  Vancomycin 2/11>> stopped 2/15  Primaxin>> 2/16>> 2/21  Voriconazole 2/16>> stopped 2/18, restarted 2/26  Vancomycin 2/17>> 2/24, restarted 2/26  Cefepime 2/25 >>   Code Status: full Family Communication: d/w patient, offered to d/w family she declined  (indicate person spoken with, relationship, and if by phone, the number) Disposition Plan: CIR   Consultants:  Oncology  HPI/Subjective: alert  Objective: Filed Vitals:   11/29/13 0616  BP: 147/73  Pulse: 108  Temp: 99.3 F (37.4 C)  Resp: 20    Intake/Output Summary (Last 24 hours) at 11/29/13 0955 Last data filed at 11/29/13 0500  Gross per 24 hour  Intake    501 ml  Output   3850 ml  Net  -3349 ml   Filed Weights   11/25/13 0400 11/26/13 0736 11/29/13 0616  Weight: 98.5 kg (217 lb 2.5 oz) 101 kg (222 lb 10.6 oz) 103.2 kg (227 lb 8.2 oz)    Exam:   General:  alert  Cardiovascular: s1,s2 rrr  Respiratory: few LL crackles    Abdomen: soft, nt, nd   Musculoskeletal: LE edema   Data Reviewed: Basic Metabolic Panel:  Recent Labs Lab 11/22/2013 0320 11/24/13 0530 11/25/13 0414 11/26/13 0526 11/27/13 0428 11/28/13 0510 11/29/13 0515  NA 143 144 144 146 148* 145 142  K 3.7 3.1* 4.2 3.9 3.1* 3.2* 3.0*  CL 103 105 105 106 109 104 101  CO2 _0 GLUCOSE 109* 172* 146* 130* 97 109* 145*  BUN 31* 32* 32* 37* 30* 24* 20  CREATININE 2.60* 2.06* 1.77* 1.43* 1.08 0.92 1.03  CALCIUM 7.6* 7.5* 8.3* 8.4 8.5 8.4 8.3*  MG 1.2* 1.7 2.4  --   --   --   --    Liver Function Tests:  Recent Labs Lab 11/21/2013 0320 11/24/13 0530 11/25/13 0414  AST 105* 55* 34  ALT 39* 40* 40*  ALKPHOS 65 61 68  BILITOT 1.1 0.4 0.4  PROT 5.4* 5.5* 6.6  ALBUMIN 2.0* 1.8* 2.2*   No results found for this basename: LIPASE, AMYLASE,  in the last 168 hours  Recent Labs Lab 11/22/13 1815  AMMONIA 19   CBC:  Recent Labs Lab 11/22/13 2356 11/17/2013 0320 11/24/13 0530  11/25/13 0414 11/26/13 0526 11/27/13 0428 11/27/13 2337 11/28/13 0510 11/29/13 0515  WBC 15.0* 19.7* 14.7*  --  15.9* 14.5* 15.9*  --  17.6* 16.4*  NEUTROABS 12.0* 15.5* 10.6*  --  12.8*  --   --   --   --   --  HGB 7.6* 7.5* 7.0*  --  8.3* 8.5* 8.5*  --  8.7* 7.9*  HCT 23.4* 22.9* 21.0*  --  25.7* 26.1* 26.4*  --  26.9* 24.9*  MCV 88.6 88.4 86.8  --  87.1 88.2 88.6  --  88.2 89.6  PLT PLATELET CLUMPS NOTED ON SMEAR, COUNT APPEARS DECREASED 36* 23*  < > 70* 36* 28* 79* 62* 25*  < > = values in this interval not displayed. Cardiac Enzymes:  Recent Labs Lab 11/22/13 1815  TROPONINI <0.30   BNP (last 3 results)  Recent Labs  11/14/13 0435 11/17/13 0800 11/20/13 0515  PROBNP 2977.0* 9541.0* 3178.0*   CBG: No results found for this basename: GLUCAP,  in the last 168 hours  Recent Results (from the past 240 hour(s))  VIRAL CULTURE VIRC     Status: None   Collection Time    11/22/13 11:46 AM      Result Value Ref Range Status    Specimen Description ULCER MOUTH   Final   Special Requests Immunocompromised   Final   Culture     Final   Value: CONTINUING TO HOLD     Note:    The current Enterovirus culture system does not detect Enterovirus D68. If Enterovirus D68 is suspected, please call client services to add the test for Enterovirus PCR (Test code [phone]).     Performed at [facility]   Report Status PENDING   Incomplete  CLOSTRIDIUM DIFFICILE BY PCR     Status: None   Collection Time    11/22/13  7:34 PM      Result Value Ref Range Status   C difficile by pcr NEGATIVE  NEGATIVE Final   Comment: Performed at [place], BLOOD (SINGLE)     Status: None   Collection Time    11/22/13 11:30 PM      Result Value Ref Range Status   Specimen Description BLOOD PIC LINE   Final   Special Requests BOTTLES DRAWN AEROBIC AND ANAEROBIC   Final   Culture  Setup Time     Final   Value: 11/09/2013 04:04     Performed at [facility]   Culture     Final   Value: NO GROWTH 5 DAYS     Performed at [facility]   Report Status 11/29/2013 FINAL   Final  AFB CULTURE WITH SMEAR     Status: None   Collection Time    11/10/2013  2:54 PM      Result Value Ref Range Status   Specimen Description BRONCHIAL ALVEOLAR LAVAGE   Final   Special Requests Immunocompromised   Final   ACID FAST SMEAR     Final   Value: NO ACID FAST BACILLI SEEN     Performed at [facility]   Culture     Final   Value: CULTURE WILL BE EXAMINED FOR 6 WEEKS BEFORE ISSUING A FINAL REPORT     Performed at [facility]   Report Status PENDING   Incomplete  CULTURE, BAL-QUANTITATIVE     Status: None   Collection Time    11/24/2013  2:54 PM      Result Value Ref Range Status   Specimen Description BRONCHIAL ALVEOLAR LAVAGE   Final   Special Requests Immunocompromised   Final   Gram Stain     Final   Value: FEW WBC PRESENT, PREDOMINANTLY PMN     NO SQUAMOUS EPITHELIAL CELLS  SEEN     NO ORGANISMS  SEEN     Performed at [facility]     Final   Value: NO GROWTH     Performed at [facility]   Culture     Final   Value: NO GROWTH 2 DAYS     Performed at [facility]   Report Status 11/26/2013 FINAL   Final  FUNGUS CULTURE W SMEAR     Status: None   Collection Time    11/25/2013  2:54 PM      Result Value Ref Range Status   Specimen Description BRONCHIAL WASHINGS   Final   Special Requests Immunocompromised   Final   Fungal Smear     Final   Value: NO YEAST OR FUNGAL ELEMENTS SEEN     Performed at [facility]   Culture     Final   Value: CULTURE IN PROGRESS FOR FOUR WEEKS     Performed at [facility]   Report Status PENDING   Incomplete  PNEUMOCYSTIS JIROVECI SMEAR BY DFA     Status: None   Collection Time    11/05/2013  2:54 PM      Result Value Ref Range Status   Specimen Source-[facility] BRONCHIAL ALVEOLAR LAVAGE   Final   Pneumocystis jiroveci Ag NEGATIVE   Final   Comment: Performed at [facility]  LEGIONELLA CULTURE     Status: None   Collection Time    11/06/2013  2:54 PM      Result Value Ref Range Status   Specimen Description BRONCHIAL WASHINGS   Final   Special Requests Immunocompromised   Final   Culture     Final   Value: NO LEGIONELLA ISOLATED     Performed at [facility]   Report Status 11/29/2013 FINAL   Final     Studies: No results found.  Scheduled Meds: . antiseptic oral rinse  15 mL Mouth Rinse q12n4p  . chlorhexidine  15 mL Mouth Rinse BID  . dronabinol  5 mg Oral BID AC  . DULoxetine  60 mg Oral QPM  . feeding supplement (ENSURE COMPLETE)  237 mL Oral TID WC  . fentaNYL  12.5 mcg Transdermal Q72H  . fluticasone  2 spray Each Nare Daily  . furosemide  40 mg Intravenous Daily  . furosemide  40 mg Intramuscular Once  . pantoprazole  40 mg Oral Daily  . potassium chloride  10 mEq Intravenous Q1 Hr x 5  . potassium chloride  40 mEq Oral Daily  . predniSONE  5 mg Oral  Q breakfast  . sodium chloride  10-40 mL Intracatheter Q12H  . sodium chloride  3 mL Intravenous Q12H   Continuous Infusions:   Principal Problem:   Septic shock Active Problems:   Hodgkin lymphoma   Myelodysplasia   Adrenal abnormality   Rash and nonspecific skin eruption   DOE (dyspnea on exertion)   Symptomatic anemia   Melena   Adrenal infarction   Hypotension, unspecified   PNA (pneumonia)   Sepsis   Acute renal failure   Pulmonary infiltrate   Fever   Acute on chronic diastolic heart failure    Time spent: >35 minutes     Kinnie Feil  [facility] Pager [phone]. If 7PM-7AM, please contact night-coverage at [email or web address], password [facility] 11/29/2013, 9:55 AM  LOS: 22 days

## 2013-11-29 NOTE — Progress Notes (Signed)
Lab called with critical platelets count of 25, MD paged and notified. No new orders at this time. Will continue to monitor.

## 2013-11-29 NOTE — Progress Notes (Signed)
Clinical Social Work  CSW reviewed chart which stated CIR needs patient to be more medically stable prior to admitting but CIR is still interested in placement. CSW asked that CIR contact CSW if patient comes to a point where she is not eligible so CSW could work on [employer] authorization. CSW met with patient at bedside and explained role. CSW explained that CSW will assist with placement at [facility] if patient is unable to go to CIR. CSW called SNF and left a message to update facility on DC plans. Patient thanked CSW for visit and for help but reports she is in pain and would like to talk in further detail at later time.  CSW will continue to follow.  [place], [place] [phone]

## 2013-11-29 NOTE — Progress Notes (Signed)
PT Cancellation Note  Patient Details Name: LORETTA DOUTT MRN: [id number] DOB: [birth year]   Cancelled Treatment:    Reason Eval/Treat Not Completed: Patient declined, no reason specified   Weston Anna, MPT Pager: [phone]

## 2013-11-29 NOTE — Progress Notes (Signed)
OT Cancellation Note  Patient Details Name: Lacey [name] MRN: [id number] DOB: 1956/07/08   Cancelled Treatment:    Reason Eval/Treat Not Completed: Fatigue/lethargy limiting ability to participate.  Pt does not feel well this pm.  Will check back on another day.    Irelynd Zumstein 11/29/2013, 2:59 PM [name], OTR/L [phone] 11/29/2013

## 2013-11-30 ENCOUNTER — Inpatient Hospital Stay (HOSPITAL_COMMUNITY): Payer: BC Managed Care – PPO

## 2013-11-30 LAB — CBC
HCT: 24.9 % — ABNORMAL LOW (ref 36.0–46.0)
HEMOGLOBIN: 7.9 g/dL — AB (ref 12.0–15.0)
MCH: 28.5 pg (ref 26.0–34.0)
MCHC: 31.7 g/dL (ref 30.0–36.0)
MCV: 89.9 fL (ref 78.0–100.0)
PLATELETS: 21 10*3/uL — AB (ref 150–400)
RBC: 2.77 MIL/uL — AB (ref 3.87–5.11)
RDW: 22 % — ABNORMAL HIGH (ref 11.5–15.5)
WBC: 18 10*3/uL — AB (ref 4.0–10.5)

## 2013-11-30 LAB — COMPREHENSIVE METABOLIC PANEL
ALBUMIN: 2.2 g/dL — AB (ref 3.5–5.2)
ALT: 18 U/L (ref 0–35)
AST: 14 U/L (ref 0–37)
Alkaline Phosphatase: 69 U/L (ref 39–117)
BUN: 17 mg/dL (ref 6–23)
CO2: 29 mEq/L (ref 19–32)
Calcium: 8.7 mg/dL (ref 8.4–10.5)
Chloride: 99 mEq/L (ref 96–112)
Creatinine, Ser: 1.19 mg/dL — ABNORMAL HIGH (ref 0.50–1.10)
GFR calc Af Amer: 57 mL/min — ABNORMAL LOW (ref >90)
GFR calc non Af Amer: 49 mL/min — ABNORMAL LOW (ref >90)
Glucose, Bld: 101 mg/dL — ABNORMAL HIGH (ref 70–99)
POTASSIUM: 4 meq/L (ref 3.7–5.3)
Sodium: 139 mEq/L (ref 137–147)
Total Bilirubin: 1 mg/dL (ref 0.3–1.2)
Total Protein: 6.4 g/dL (ref 6.0–8.3)

## 2013-11-30 LAB — LACTATE DEHYDROGENASE: LDH: 566 U/L — ABNORMAL HIGH (ref 94–250)

## 2013-11-30 MED ORDER — FUROSEMIDE 10 MG/ML IJ SOLN
40.0000 mg | Freq: Two times a day (BID) | INTRAMUSCULAR | Status: DC
  Administered 2013-11-30: 40 mg via INTRAVENOUS
  Filled 2013-11-30 (×4): qty 4

## 2013-11-30 MED ORDER — METOPROLOL TARTRATE 12.5 MG HALF TABLET
12.5000 mg | ORAL_TABLET | Freq: Two times a day (BID) | ORAL | Status: DC
  Administered 2013-11-30 – 2013-12-01 (×4): 12.5 mg via ORAL
  Filled 2013-11-30 (×6): qty 1

## 2013-11-30 MED ORDER — DEXTROSE 5 % IV SOLN
5.0000 mg/kg | Freq: Three times a day (TID) | INTRAVENOUS | Status: DC
  Administered 2013-11-30 – 2013-12-06 (×19): 230 mg via INTRAVENOUS
  Filled 2013-11-30 (×20): qty 4.6

## 2013-11-30 MED ORDER — FLUCONAZOLE IN SODIUM CHLORIDE 200-0.9 MG/100ML-% IV SOLN
200.0000 mg | INTRAVENOUS | Status: DC
  Administered 2013-11-30 – 2013-12-02 (×3): 200 mg via INTRAVENOUS
  Filled 2013-11-30 (×4): qty 100

## 2013-11-30 NOTE — Progress Notes (Signed)
Clinical Social Work  CSW spoke with SNF who reports they have received authorization for SNF placement through insurance and can admit patient whenever stable. CSW will continue to follow and will assist with transfer once medically stable.  [place],  [phone]

## 2013-11-30 NOTE — Progress Notes (Signed)
Clinical Social Work  CSW spoke with Genie from [employer] who reports that it appears that patient will not be eligible for CIR at this time. CSW sent clinicals to [facility] and spoke with admissions re: submitting authorization for SNF placement. CSW will continue to follow.  [place], [place] [phone]

## 2013-11-30 NOTE — Progress Notes (Addendum)
[facility] PROGRESS NOTE  Lacey [name] NLZ:[id number] DOB: 16-Aug-1956 DOA: 11/11/2013 PCP: Vena Austria, MD  Brief summary  58 y.o. female with history of Hodgkin's lymphoma status post chemotherapy now with myelodysplastic syndrome who presented with abdominal pain, nausea, vomiting, hypotension.  -she was found to have adrenal infarct, adrenal insufficiency, HCAP, sepsis, hypotension,  She also developed hemorrhage into her left iliopsoas muscle which caused weakness and pain of her left leg and LLQ. - She started to clinically improve, however, a day or two after her antibiotics were discontinued she developed septic vs. Adrenal shock and had to be transferred to the ICU again for vasopressor support. She was restarted on broad spectrum abx and stress dose steroids. ID was consulted and PCCM followed. She underwent bronchoscopy, however, all cultures were negative. She will have completed an additional 7 days of antibiotics on 3/3. Her course has been complicated by AKI which has now resolved, however, she is hypervolemic still so she is getting diuresed with IV lasix. Additionally, Dr. Marin Olp is determining what treatment to initiate for her MDS as she has required frequent transfusions. She would benefit from rehab at SNF once medically stable.   Assessment/Plan  1. Myelodysplastic syndrome in setting of Hodgkin Lymphoma: Refractory anemia and thrombocytopenia requiring almost daily blood product transfusions, Bone Marrow Biopsy: HYPERCELLULAR BONE MARROW FOR AGE WITH DYSPOIETIC CHANGES. myelodysplastic/myeloproliferative process, particularly chronic myelomonocytic leukemia - Appreciate Dr Jonette Eva assistance.  - daily transfusions as patient is developing antibodies. Need treatment solution or palliative care plan. Defer to Oncology.   2. Recurrent fever/septic shock with unknown source, resolving on broad spectrum abx. CT chest on 2/23: Persistent consolidation RML with  new peribronchovascular opacities suggestive of bronchopneumonia. Underwent bronch on 2/26  - Repeat BCx NGTD  - BAL: PCP neg, Bacterial, fungal, and AFB cultures NGTD  - UA: Neg  - Continued vanc and cefepime through 3/3, then stopped  - C. Diff PCR neg -added fluconazole, acyclovir for tongue ulcer,  3. Acute on chronic diastolic heart failure due to IVF from resuscitation. Echo (2/15): Preserved EF and grade 1 diastolic heart failure -anasarca  - Continue lasix BID, Daily weights and strict I/O  4. Elevated BPs, may be lingering effect of steroids, pain. No previous hx of HTN  - Continue hydralazine prn  - Control pain  - Start daily lasix, add low dose BB to control HR 4. 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 Acute Renal Failure likely due to septic shock, resolved. RUS wnl. Hypervolemic  - Continue lasix IV daily, close monitor  6. Spontaneous retroperitoneal bleed in setting of thrombocytopenia; hemorrhage into her left iliopsoas muscle which caused weakness and pain of her left leg and LLQ - Started fentanyl patch on 3/2  - Continue ultram, but consider escalating if patient desires  7. Right upper extremity superficial thrombosis, stable  - Not on ac due to thrombocytopenia  8. Transaminitis due to septic shock, resolved.  9. Urge and stress incontinence  - Foley placed for comfort  10. Hypokalemia due to lasix  - Start daily oral repletion   Diet: regular  Access: PICC  IVF: on  Proph: Hold for thrombocytopenia, spont retroperitoneal bleed  Code Status: full  Family Communication: patient alone  Disposition Plan: Clinically improving. PT/OT recommending CIR vs. SNF. Need plan for marrow failure, however, because she is requiring frequent blood and plt transfusions.  Consultants:  Dr. Burney Gauze (oncology)  Dr Gibson Ramp (  Surgery) curbside consult  Dr. Carlyle Basques (infectious disease)  Procedures:   11/12/2013 right upper extremity Doppler  No evidence of deep vein thrombosis involving the right upper extremity, left subclavian vein, and left internal jugular vein. There is superficial thrombosis noted in the right cephalic vein.  -0/26 Bone marrow biopsy report pending  Echocardiogram 01/10/2013  - Left ventricle: mild focal basal hypertrophy of the septum.  -LVEF= ejection fraction was 55%. Wall motion was normal; there were no regional wall motion abnormalities.  -(grade 1 diastolic dysfunction).  - Aortic valve: There was no stenosis. Trivial regurgitation. - Mitral valve: Trivial regurgitation. - Left atrium: The atrium was mildly dilated. - Right ventricle: The cavity size was normal. Systolic function was normal. - Tricuspid valve: Peak RV-RA gradient: 6mm Hg (S). - Pulmonary arteries: PA peak pressure: 49mm Hg (S). - Inferior vena cava: The vessel was normal in size; the respirophasic diameter changes were in the normal range (=50%); findings are consistent with normal central venous pressure.  Antibiotics:  Aztreonam 2/11>> stopped 2/13  Ciprofloxacin 2/11>> stopped 2/15  Vancomycin 2/11>> stopped 2/15  Primaxin>> 2/16>> 2/21  Voriconazole 2/16>> stopped 2/18, restarted 2/26  Vancomycin 2/17>> 2/24, restarted 2/26  Cefepime 2/25 >>   Code Status: full Family Communication: d/w patient, offered to d/w family she declined  (indicate person spoken with, relationship, and if by phone, the number) Disposition Plan: CIR   Consultants:  Oncology  HPI/Subjective: alert  Objective: Filed Vitals:   11/30/13 0617  BP: 157/76  Pulse: 104  Temp: 98.7 F (37.1 C)  Resp: 20    Intake/Output Summary (Last 24 hours) at 11/30/13 1012 Last data filed at 11/29/13 2141  Gross per 24 hour  Intake      0 ml  Output   1600 ml  Net  -1600 ml   Filed Weights   11/25/13 0400 11/26/13 0736 11/29/13 0616  Weight: 98.5 kg (217 lb 2.5 oz) 101 kg (222 lb 10.6 oz) 103.2 kg  (227 lb 8.2 oz)    Exam:   General:  alert  Cardiovascular: s1,s2 rrr  Respiratory: few LL crackles   Abdomen: soft, nt, nd   Musculoskeletal: LE edema   Data Reviewed: Basic Metabolic Panel:  Recent Labs Lab 11/24/13 0530 11/25/13 0414 11/26/13 0526 11/27/13 0428 11/28/13 0510 11/29/13 0515 11/30/13 0530  NA 144 144 146 148* 145 142 139  K 3.1* 4.2 3.9 3.1* 3.2* 3.0* 4.0  CL 105 105 106 109 104 101 99  CO2 $Re'25 27 29 29 31 31 29  'qdP$ GLUCOSE 172* 146* 130* 97 109* 145* 101*  BUN 32* 32* 37* 30* 24* 20 17  CREATININE 2.06* 1.77* 1.43* 1.08 0.92 1.03 1.19*  CALCIUM 7.5* 8.3* 8.4 8.5 8.4 8.3* 8.7  MG 1.7 2.4  --   --   --   --   --    Liver Function Tests:  Recent Labs Lab 11/24/13 0530 11/25/13 0414 11/30/13 0530  AST 55* 34 14  ALT 40* 40* 18  ALKPHOS 61 68 69  BILITOT 0.4 0.4 1.0  PROT 5.5* 6.6 6.4  ALBUMIN 1.8* 2.2* 2.2*   No results found for this basename: LIPASE, AMYLASE,  in the last 168 hours No results found for this basename: AMMONIA,  in the last 168 hours CBC:  Recent Labs Lab 11/24/13 0530  11/25/13 0414 11/26/13 0526 11/27/13 0428 11/27/13 2337 11/28/13 0510 11/29/13 0515 11/30/13 0530  WBC 14.7*  --  15.9* 14.5* 15.9*  --  17.6*  16.4* 18.0*  NEUTROABS 10.6*  --  12.8*  --   --   --   --   --   --   HGB 7.0*  --  8.3* 8.5* 8.5*  --  8.7* 7.9* 7.9*  HCT 21.0*  --  25.7* 26.1* 26.4*  --  26.9* 24.9* 24.9*  MCV 86.8  --  87.1 88.2 88.6  --  88.2 89.6 89.9  PLT 23*  < > 70* 36* 28* 79* 62* 25* 21*  < > = values in this interval not displayed. Cardiac Enzymes: No results found for this basename: CKTOTAL, CKMB, CKMBINDEX, TROPONINI,  in the last 168 hours BNP (last 3 results)  Recent Labs  11/14/13 0435 11/17/13 0800 11/20/13 0515  PROBNP 2977.0* 9541.0* 3178.0*   CBG: No results found for this basename: GLUCAP,  in the last 168 hours  Recent Results (from the past 240 hour(s))  VIRAL CULTURE VIRC     Status: None   Collection  Time    11/22/13 11:46 AM      Result Value Ref Range Status   Specimen Description ULCER MOUTH   Final   Special Requests Immunocompromised   Final   Culture     Final   Value: CONTINUING TO HOLD     Note:    The current Enterovirus culture system does not detect Enterovirus D68. If Enterovirus D68 is suspected, please call client services to add the test for Enterovirus PCR (Test code [phone]).     Performed at [facility]   Report Status PENDING   Incomplete  CLOSTRIDIUM DIFFICILE BY PCR     Status: None   Collection Time    11/22/13  7:34 PM      Result Value Ref Range Status   C difficile by pcr NEGATIVE  NEGATIVE Final   Comment: Performed at [place], BLOOD (SINGLE)     Status: None   Collection Time    11/22/13 11:30 PM      Result Value Ref Range Status   Specimen Description BLOOD PIC LINE   Final   Special Requests BOTTLES DRAWN AEROBIC AND ANAEROBIC   Final   Culture  Setup Time     Final   Value: 11/17/2013 04:04     Performed at [facility]   Culture     Final   Value: NO GROWTH 5 DAYS     Performed at [facility]   Report Status 11/29/2013 FINAL   Final  AFB CULTURE WITH SMEAR     Status: None   Collection Time    11/19/2013  2:54 PM      Result Value Ref Range Status   Specimen Description BRONCHIAL ALVEOLAR LAVAGE   Final   Special Requests Immunocompromised   Final   ACID FAST SMEAR     Final   Value: NO ACID FAST BACILLI SEEN     Performed at [facility]   Culture     Final   Value: CULTURE WILL BE EXAMINED FOR 6 WEEKS BEFORE ISSUING A FINAL REPORT     Performed at [facility]   Report Status PENDING   Incomplete  CULTURE, BAL-QUANTITATIVE     Status: None   Collection Time    11/14/2013  2:54 PM      Result Value Ref Range Status   Specimen Description BRONCHIAL ALVEOLAR LAVAGE   Final   Special Requests Immunocompromised   Final   Gram  Stain     Final   Value: FEW WBC PRESENT,  PREDOMINANTLY PMN     NO SQUAMOUS EPITHELIAL CELLS SEEN     NO ORGANISMS SEEN     Performed at [facility]     Final   Value: NO GROWTH     Performed at [facility]   Culture     Final   Value: NO GROWTH 2 DAYS     Performed at [facility]   Report Status 11/26/2013 FINAL   Final  FUNGUS CULTURE W SMEAR     Status: None   Collection Time    11/25/2013  2:54 PM      Result Value Ref Range Status   Specimen Description BRONCHIAL WASHINGS   Final   Special Requests Immunocompromised   Final   Fungal Smear     Final   Value: NO YEAST OR FUNGAL ELEMENTS SEEN     Performed at [facility]   Culture     Final   Value: CULTURE IN PROGRESS FOR FOUR WEEKS     Performed at [facility]   Report Status PENDING   Incomplete  PNEUMOCYSTIS JIROVECI SMEAR BY DFA     Status: None   Collection Time    11/05/2013  2:54 PM      Result Value Ref Range Status   Specimen Source-[facility] BRONCHIAL ALVEOLAR LAVAGE   Final   Pneumocystis jiroveci Ag NEGATIVE   Final   Comment: Performed at [facility]  LEGIONELLA CULTURE     Status: None   Collection Time    11/20/2013  2:54 PM      Result Value Ref Range Status   Specimen Description BRONCHIAL WASHINGS   Final   Special Requests Immunocompromised   Final   Culture     Final   Value: NO LEGIONELLA ISOLATED     Performed at [facility]   Report Status 11/29/2013 FINAL   Final     Studies: Dg Chest Port 1 View  11/30/2013   CLINICAL DATA:  Weakness and cough  EXAM: PORTABLE CHEST - 1 VIEW  COMPARISON:  DG CHEST 2 VIEW dated 11/27/2013  FINDINGS: The left lung is adequately inflated and exhibits mildly increased interstitial markings but no focal infiltrate. On the right there is persistent perihilar opacity following along the minor fissure. In the infrahilar region on the right there remains increased density which may reflect atelectasis. The costophrenic angle on the right is  less distinct today. The cardiac silhouette is top-normal in size. The pulmonary vascularity is not clearly engorged. The left-sided PICC line tip appears unchanged lying in the midportion of the SVC.  IMPRESSION: 1. There is persistent abnormality in the right hemithorax in the perihilar and infrahilar regions that suggests atelectasis or infiltrate. The lateral costophrenic angle is less distinct which may reflect atelectasis or fluid. When the patient can tolerate the procedure, a PA and lateral chest x-ray with deep inspiration would be of value. 2. The cardiac silhouette is top-normal in size. The interstitial markings of the left lung are mildly increased as compared to the previous study but there is no evidence of pneumonia.   Electronically Signed   By: David  Martinique   On: 11/30/2013 08:18    Scheduled Meds: . acyclovir  5 mg/kg (Ideal) Intravenous Q8H  . antiseptic oral rinse  15 mL Mouth Rinse q12n4p  . chlorhexidine  15 mL  Mouth Rinse BID  . dronabinol  5 mg Oral BID AC  . DULoxetine  60 mg Oral QPM  . feeding supplement (ENSURE COMPLETE)  237 mL Oral TID WC  . fentaNYL  12.5 mcg Transdermal Q72H  . fluconazole (DIFLUCAN) IV  200 mg Intravenous Q24H  . fluticasone  2 spray Each Nare Daily  . furosemide  40 mg Intravenous Daily  . furosemide  40 mg Intramuscular Once  . pantoprazole  40 mg Oral Daily  . potassium chloride  40 mEq Oral BID  . predniSONE  5 mg Oral Q breakfast  . sodium chloride  10-40 mL Intracatheter Q12H  . sodium chloride  3 mL Intravenous Q12H   Continuous Infusions:   Principal Problem:   Septic shock Active Problems:   Hodgkin lymphoma   Myelodysplasia   Adrenal abnormality   Rash and nonspecific skin eruption   DOE (dyspnea on exertion)   Symptomatic anemia   Melena   Adrenal infarction   Hypotension, unspecified   PNA (pneumonia)   Sepsis   Acute renal failure   Pulmonary infiltrate   Fever   Acute on chronic diastolic heart  failure    Time spent: >35 minutes     Kinnie Feil  [facility] Pager [phone]. If 7PM-7AM, please contact night-coverage at [email or web address], password [facility] 11/30/2013, 10:12 AM  LOS: 23 days

## 2013-11-30 NOTE — Progress Notes (Signed)
PT Cancellation Note  Patient Details Name: Lacey [name] MRN: [id number] DOB: 02/18/1956   Cancelled Treatment:    Reason Eval/Treat Not Completed: Medical issues which prohibited therapy (lethargic)   [name] 11/30/2013, 4:59 PM

## 2013-12-01 ENCOUNTER — Telehealth: Payer: Self-pay | Admitting: Hematology & Oncology

## 2013-12-01 ENCOUNTER — Ambulatory Visit (HOSPITAL_COMMUNITY): Payer: BC Managed Care – PPO

## 2013-12-01 DIAGNOSIS — K140 Glossitis: Secondary | ICD-10-CM

## 2013-12-01 DIAGNOSIS — R000 Tachycardia, unspecified: Secondary | ICD-10-CM

## 2013-12-01 LAB — BASIC METABOLIC PANEL
BUN: 19 mg/dL (ref 6–23)
CHLORIDE: 98 meq/L (ref 96–112)
CO2: 29 mEq/L (ref 19–32)
Calcium: 8.5 mg/dL (ref 8.4–10.5)
Creatinine, Ser: 1.56 mg/dL — ABNORMAL HIGH (ref 0.50–1.10)
GFR calc Af Amer: 41 mL/min — ABNORMAL LOW (ref >90)
GFR calc non Af Amer: 35 mL/min — ABNORMAL LOW (ref >90)
Glucose, Bld: 96 mg/dL (ref 70–99)
POTASSIUM: 4.1 meq/L (ref 3.7–5.3)
Sodium: 139 mEq/L (ref 137–147)

## 2013-12-01 LAB — URINALYSIS, ROUTINE W REFLEX MICROSCOPIC
Bilirubin Urine: NEGATIVE
GLUCOSE, UA: NEGATIVE mg/dL
Ketones, ur: NEGATIVE mg/dL
LEUKOCYTES UA: NEGATIVE
Nitrite: NEGATIVE
PROTEIN: 100 mg/dL — AB
SPECIFIC GRAVITY, URINE: 1.01 (ref 1.005–1.030)
UROBILINOGEN UA: 1 mg/dL (ref 0.0–1.0)
pH: 8.5 — ABNORMAL HIGH (ref 5.0–8.0)

## 2013-12-01 LAB — CBC WITH DIFFERENTIAL/PLATELET
BASOS PCT: 1 % (ref 0–1)
Basophils Absolute: 0.2 10*3/uL — ABNORMAL HIGH (ref 0.0–0.1)
EOS PCT: 2 % (ref 0–5)
Eosinophils Absolute: 0.3 10*3/uL (ref 0.0–0.7)
HCT: 22.7 % — ABNORMAL LOW (ref 36.0–46.0)
HEMOGLOBIN: 7.2 g/dL — AB (ref 12.0–15.0)
LYMPHS PCT: 17 % (ref 12–46)
Lymphs Abs: 2.9 10*3/uL (ref 0.7–4.0)
MCH: 28.6 pg (ref 26.0–34.0)
MCHC: 31.7 g/dL (ref 30.0–36.0)
MCV: 90.1 fL (ref 78.0–100.0)
MONO ABS: 0.7 10*3/uL (ref 0.1–1.0)
Monocytes Relative: 4 % (ref 3–12)
NEUTROS PCT: 76 % (ref 43–77)
Neutro Abs: 13.1 10*3/uL — ABNORMAL HIGH (ref 1.7–7.7)
Platelets: 16 10*3/uL — CL (ref 150–400)
RBC: 2.52 MIL/uL — AB (ref 3.87–5.11)
RDW: 22.1 % — ABNORMAL HIGH (ref 11.5–15.5)
WBC: 17.2 10*3/uL — ABNORMAL HIGH (ref 4.0–10.5)

## 2013-12-01 LAB — URINE MICROSCOPIC-ADD ON

## 2013-12-01 LAB — PATHOLOGIST SMEAR REVIEW

## 2013-12-01 LAB — LACTATE DEHYDROGENASE: LDH: 642 U/L — ABNORMAL HIGH (ref 94–250)

## 2013-12-01 MED ORDER — DEXTROSE 5 % IV SOLN
1.0000 g | Freq: Two times a day (BID) | INTRAVENOUS | Status: DC
  Administered 2013-12-01 – 2013-12-02 (×2): 1 g via INTRAVENOUS
  Filled 2013-12-01 (×4): qty 1

## 2013-12-01 MED ORDER — SODIUM CHLORIDE 0.9 % IV SOLN
1250.0000 mg | INTRAVENOUS | Status: DC
  Administered 2013-12-02: 1250 mg via INTRAVENOUS
  Filled 2013-12-01: qty 1250

## 2013-12-01 MED ORDER — SODIUM CHLORIDE 0.9 % IV SOLN
2000.0000 mg | Freq: Once | INTRAVENOUS | Status: AC
  Administered 2013-12-01: 2000 mg via INTRAVENOUS
  Filled 2013-12-01: qty 2000

## 2013-12-01 MED ORDER — DEXTROSE 5 % IV SOLN
2.0000 g | INTRAVENOUS | Status: AC
  Administered 2013-12-01: 2 g via INTRAVENOUS
  Filled 2013-12-01: qty 2

## 2013-12-01 NOTE — Progress Notes (Signed)
ANTIBIOTIC CONSULT NOTE - INITIAL  Pharmacy Consult for vancomycin Indication: suspected PICC-associated infection  Allergies  Allergen Reactions  . Penicillins Hives and Rash  . Contrast Media [Iodinated Diagnostic Agents]     Patient Measurements: Height: 5' (152.4 cm) Weight: 227 lb 8.2 oz (103.2 kg) IBW/kg (Calculated) : 45.5   Vital Signs: Temp: 103.1 F (39.5 C) (03/06 0538) Temp src: Rectal (03/06 0538) BP: 122/68 mmHg (03/06 0538) Pulse Rate: 126 (03/06 0538) Intake/Output from previous day: 03/05 0701 - 03/06 0700 In: 413.8 [IV Piggyback:413.8] Out: 3800 [Urine:3800] Intake/Output from this shift:    Labs:  Recent Labs  11/29/13 0515 11/30/13 0530 12/01/13 0512  WBC 16.4* 18.0* 17.2*  HGB 7.9* 7.9* 7.2*  PLT 25* 21* 16*  CREATININE 1.03 1.19* 1.56*   Estimated Creatinine Clearance: 42.1 ml/min (by C-G formula based on Cr of 1.56). No results found for this basename: VANCOTROUGH, Leodis Binet, VANCORANDOM, GENTTROUGH, GENTPEAK, GENTRANDOM, TOBRATROUGH, TOBRAPEAK, TOBRARND, AMIKACINPEAK, AMIKACINTROU, AMIKACIN,  in the last 72 hours   Microbiology: Recent Results (from the past 720 hour(s))  CULTURE, BLOOD (ROUTINE X 2)     Status: None   Collection Time    11/08/13 12:40 AM      Result Value Ref Range Status   Specimen Description BLOOD LEFT ANTECUBITAL   Final   Special Requests BOTTLES DRAWN AEROBIC AND ANAEROBIC 3CC   Final   Culture  Setup Time     Final   Value: 11/08/2013 03:32     Performed at [facility]   Culture     Final   Value: NO GROWTH 5 DAYS     Performed at [facility]   Report Status 11/14/2013 FINAL   Final  CULTURE, BLOOD (ROUTINE X 2)     Status: None   Collection Time    11/08/13 12:45 AM      Result Value Ref Range Status   Specimen Description BLOOD LEFT HAND   Final   Special Requests BOTTLES DRAWN AEROBIC AND ANAEROBIC 3CC   Final   Culture  Setup Time     Final   Value: 11/08/2013 03:31     Performed  at [facility]   Culture     Final   Value: NO GROWTH 5 DAYS     Performed at [facility]   Report Status 11/14/2013 FINAL   Final  RESPIRATORY VIRUS PANEL     Status: None   Collection Time    11/11/13 12:00 AM      Result Value Ref Range Status   Source - RVPAN NASOPHARYNGEAL   Final   Respiratory Syncytial Virus A NOT DETECTED   Final   Respiratory Syncytial Virus B NOT DETECTED   Final   Influenza A NOT DETECTED   Final   Influenza B NOT DETECTED   Final   Parainfluenza 1 NOT DETECTED   Final   Parainfluenza 2 NOT DETECTED   Final   Parainfluenza 3 NOT DETECTED   Final   Metapneumovirus NOT DETECTED   Final   Rhinovirus NOT DETECTED   Final   Adenovirus NOT DETECTED   Final   Influenza A H1 NOT DETECTED   Final   Influenza A H3 NOT DETECTED   Final   Comment: (NOTE)           Normal Reference Range for each Analyte: NOT DETECTED     Testing performed using the Luminex xTAG Respiratory Viral Panel test     kit.  This test was developed and its performance characteristics determined     by [facility]. It has not been cleared or approved by the [place]     Food and Drug Administration. This test is used for clinical purposes.     It should not be regarded as investigational or for research. This     laboratory is certified under the [place] (CLIA) as qualified to perform high complexity     clinical laboratory testing.     Performed at [place], BLOOD (ROUTINE X 2)     Status: None   Collection Time    11/13/13  6:20 AM      Result Value Ref Range Status   Specimen Description BLOOD LEFT HAND   Final   Special Requests     Final   Value: BOTTLES DRAWN AEROBIC AND ANAEROBIC [place] A[facility] 3CC ANA   Culture  Setup Time     Final   Value: 11/13/2013 08:35     Performed at [facility]   Culture     Final   Value: NO GROWTH 5 DAYS     Performed at [facility]   Report  Status 11/19/2013 FINAL   Final  CULTURE, BLOOD (ROUTINE X 2)     Status: None   Collection Time    11/13/13  6:35 AM      Result Value Ref Range Status   Specimen Description BLOOD LEFT HAND   Final   Special Requests     Final   Value: BOTTLES DRAWN AEROBIC AND ANAEROBIC [place] AER 3CC ANA   Culture  Setup Time     Final   Value: 11/13/2013 08:35     Performed at [facility]   Culture     Final   Value: NO GROWTH 5 DAYS     Performed at [facility]   Report Status 11/19/2013 FINAL   Final  MRSA PCR SCREENING     Status: None   Collection Time    11/13/13  6:04 PM      Result Value Ref Range Status   MRSA by PCR NEGATIVE  NEGATIVE Final   Comment:            The GeneXpert MRSA Assay (FDA     approved for NASAL specimens     only), is one component of a     comprehensive MRSA colonization     surveillance program. It is not     intended to diagnose MRSA     infection nor to guide or     monitor treatment for     MRSA infections.  URINE CULTURE     Status: None   Collection Time    11/13/13  8:55 PM      Result Value Ref Range Status   Specimen Description URINE, CATHETERIZED   Final   Special Requests NONE   Final   Culture  Setup Time     Final   Value: 11/14/2013 01:07     Performed at [place]     Final   Value: NO GROWTH     Performed at [facility]   Culture     Final   Value: NO GROWTH     Performed at [facility]   Report Status 11/14/2013 FINAL   Final  CULTURE, EXPECTORATED SPUTUM-ASSESSMENT  Status: None   Collection Time    11/14/13  4:36 PM      Result Value Ref Range Status   Specimen Description SPUTUM   Final   Special Requests Immunocompromised   Final   Sputum evaluation     Final   Value: THIS SPECIMEN IS ACCEPTABLE. RESPIRATORY CULTURE REPORT TO FOLLOW.   Report Status 11/14/2013 FINAL   Final  CULTURE, RESPIRATORY (NON-EXPECTORATED)     Status: None   Collection Time    11/14/13   4:36 PM      Result Value Ref Range Status   Specimen Description SPUTUM   Final   Special Requests NONE   Final   Gram Stain     Final   Value: RARE WBC PRESENT, PREDOMINANTLY PMN     RARE SQUAMOUS EPITHELIAL CELLS PRESENT     NO ORGANISMS SEEN     Performed at [facility]   Culture     Final   Value: NORMAL OROPHARYNGEAL FLORA     Performed at [facility]   Report Status 11/17/2013 FINAL   Final  HERPES SIMPLEX VIRUS CULTURE     Status: None   Collection Time    11/14/13  6:49 PM      Result Value Ref Range Status   Specimen Description MOUTH   Final   Special Requests Immunocompromised   Final   Culture     Final   Value: No Herpes Simplex Virus detected.     Performed at [facility]   Report Status 11/16/2013 FINAL   Final  VIRAL CULTURE VIRC     Status: None   Collection Time    11/22/13 11:46 AM      Result Value Ref Range Status   Specimen Description ULCER MOUTH   Final   Special Requests Immunocompromised   Final   Culture     Final   Value: CONTINUING TO HOLD     Note:    The current Enterovirus culture system does not detect Enterovirus D68. If Enterovirus D68 is suspected, please call client services to add the test for Enterovirus PCR (Test code [id number]).     Performed at [facility]   Report Status PENDING   Incomplete  CLOSTRIDIUM DIFFICILE BY PCR     Status: None   Collection Time    11/22/13  7:34 PM      Result Value Ref Range Status   C difficile by pcr NEGATIVE  NEGATIVE Final   Comment: Performed at [place], BLOOD (SINGLE)     Status: None   Collection Time    11/22/13 11:30 PM      Result Value Ref Range Status   Specimen Description BLOOD PIC LINE   Final   Special Requests BOTTLES DRAWN AEROBIC AND ANAEROBIC   Final   Culture  Setup Time     Final   Value: 11/12/2013 04:04     Performed at [facility]   Culture     Final   Value: NO GROWTH 5 DAYS     Performed at [facility]   Report Status 11/29/2013 FINAL   Final  AFB CULTURE WITH SMEAR     Status: None   Collection Time    11/25/2013  2:54 PM      Result Value Ref Range Status   Specimen Description BRONCHIAL ALVEOLAR LAVAGE   Final   Special Requests Immunocompromised   Final  ACID FAST SMEAR     Final   Value: NO ACID FAST BACILLI SEEN     Performed at [facility]   Culture     Final   Value: CULTURE WILL BE EXAMINED FOR 6 WEEKS BEFORE ISSUING A FINAL REPORT     Performed at [facility]   Report Status PENDING   Incomplete  CULTURE, BAL-QUANTITATIVE     Status: None   Collection Time    11/16/2013  2:54 PM      Result Value Ref Range Status   Specimen Description BRONCHIAL ALVEOLAR LAVAGE   Final   Special Requests Immunocompromised   Final   Gram Stain     Final   Value: FEW WBC PRESENT, PREDOMINANTLY PMN     NO SQUAMOUS EPITHELIAL CELLS SEEN     NO ORGANISMS SEEN     Performed at [facility]     Final   Value: NO GROWTH     Performed at [facility]   Culture     Final   Value: NO GROWTH 2 DAYS     Performed at [facility]   Report Status 11/26/2013 FINAL   Final  FUNGUS CULTURE W SMEAR     Status: None   Collection Time    10/31/2013  2:54 PM      Result Value Ref Range Status   Specimen Description BRONCHIAL WASHINGS   Final   Special Requests Immunocompromised   Final   Fungal Smear     Final   Value: NO YEAST OR FUNGAL ELEMENTS SEEN     Performed at [facility]   Culture     Final   Value: CULTURE IN PROGRESS FOR FOUR WEEKS     Performed at [facility]   Report Status PENDING   Incomplete  PNEUMOCYSTIS JIROVECI SMEAR BY DFA     Status: None   Collection Time    11/06/2013  2:54 PM      Result Value Ref Range Status   Specimen Source-PJSRC BRONCHIAL ALVEOLAR LAVAGE   Final   Pneumocystis jiroveci Ag NEGATIVE   Final   Comment: Performed at [place] [facility]  LEGIONELLA CULTURE      Status: None   Collection Time    11/21/2013  2:54 PM      Result Value Ref Range Status   Specimen Description BRONCHIAL WASHINGS   Final   Special Requests Immunocompromised   Final   Culture     Final   Value: NO LEGIONELLA ISOLATED     Performed at [facility]   Report Status 11/29/2013 FINAL   Final  WOUND CULTURE     Status: None   Collection Time    11/30/13 12:25 PM      Result Value Ref Range Status   Specimen Description ORAL TONGUE ULCER   Final   Special Requests Immunocompromised   Final   Gram Stain PENDING   Incomplete   Culture     Final   Value: Culture reincubated for better growth     Performed at [facility]   Report Status PENDING   Incomplete    Medical History: Past Medical History  Diagnosis Date  . Cancer   . Cough productive of clear sputum 01/04/2013  . Fibromyalgia     Medications:  Scheduled:  . acyclovir  5 mg/kg (Ideal) Intravenous Q8H  . antiseptic oral rinse  15 mL Mouth Rinse q12n4p  . chlorhexidine  15 mL Mouth Rinse BID  . dronabinol  5 mg Oral BID AC  . DULoxetine  60 mg Oral QPM  . feeding supplement (ENSURE COMPLETE)  237 mL Oral TID WC  . fentaNYL  12.5 mcg Transdermal Q72H  . fluconazole (DIFLUCAN) IV  200 mg Intravenous Q24H  . fluticasone  2 spray Each Nare Daily  . furosemide  40 mg Intramuscular Once  . furosemide  40 mg Intravenous BID  . metoprolol tartrate  12.5 mg Oral BID  . pantoprazole  40 mg Oral Daily  . potassium chloride  40 mEq Oral BID  . predniSONE  5 mg Oral Q breakfast  . sodium chloride  10-40 mL Intracatheter Q12H  . sodium chloride  3 mL Intravenous Q12H  . [START ON 12/02/2013] vancomycin  1,250 mg Intravenous Q24H  . 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  Orders received to begin empiric vancomycin with pharmacy dosing assistance for probable PICC-associated infection.   PICC removal and blood cultures were also ordered.  Prior vancomycin dosing records, serum creatinine trends, and vancomycin troughs from current admission were reviewed  Increasing SCr noted (from 1.19 to 1.56 in past 24 hrs)  Goal of Therapy:  Appropriate antibiotic dosing for renal function; eradication of infection Vancomycin trough 15-20  Plan:  1. Vancomycin 2000 mg IV x 1 only this AM 2. Vancomycin 1250 mg IV q24h, next dose tomorrow at 12 noon 3. Review serum creatinine tomorrow AM before vancomycin dose administered and make further dosage adjustments if needed. 4. Check vancomycin trough when appropriate, likely prior to steady-state given unstable SCr.  Clayburn Pert, PharmD, BCPS Pager: [phone] 12/01/2013  8:10 AM

## 2013-12-01 NOTE — Telephone Encounter (Signed)
Faxed Medical Records via fax today  to:  [place] [facility] Ph: [phone] Fx: [phone]   Medical  Records requested from 10/2013 to present   [place] SCANNED

## 2013-12-01 NOTE — Consult Note (Signed)
Reason for Consult: Right tongue ulcer Referring Physician:  Kinnie Feil, MD   HPI:  Lacey [name] is an 58 y.o. female Lacey [name] is a 58 y.o. female with history of Hodgkin's lymphoma s/p stem cell transplant. 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 She was found to have pyelonephritis as well as purulent bronchitis and anemia. CT chest negative for PE. CT abdomen: L-adrenal infarct. Started on stress dose steriods for septic shock v/s adrenal crises. 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 Patient with progressive leucocytosis with recurrent fever and ID consulted for input and recommended Vancomycin/imipenem X 14 days. CXR with RML persistent collapse and small effusions per U/S. She developed severe left hip pain on 11/20/13 due to left iliacs hemorrhage and thrombocytopenia treated with 2 units platelets. She continued to have intermittent fevers and continued on broad spectrum antibiotics and Voriconazole.  Pt reports that over the past 2 weeks, she has noted an enlarging ulcer on her right lateral tongue.  It is tender to touch. She has been treated with acyclovir, chlorhexidine, and antiseptic oral rinse without improvement in her ulcer. She has no history of tobacco use.  No drug use history.    Past Medical History  Diagnosis Date  . Cancer   . Cough productive of clear sputum 01/04/2013  . Fibromyalgia     Past Surgical History  Procedure Laterality Date  . Bone marrow biopsy  05/20110  . Spine surgery  1998    c-4 c 5 fusion  . Hodgkin      Family History  Problem Relation Age of Onset  . Cancer Mother   . Cancer Father     Social History:  reports that she has never smoked. She has never used smokeless tobacco. She reports that she drinks alcohol. She reports that she does not use illicit drugs.  Allergies:  Allergies   Allergen Reactions  . Penicillins Hives and Rash  . Contrast Media [Iodinated Diagnostic Agents]     Medications:  I have reviewed the patient's current medications. Scheduled: . acyclovir  5 mg/kg (Ideal) Intravenous Q8H  . antiseptic oral rinse  15 mL Mouth Rinse q12n4p  . chlorhexidine  15 mL Mouth Rinse BID  . dronabinol  5 mg Oral BID AC  . DULoxetine  60 mg Oral QPM  . feeding supplement (ENSURE COMPLETE)  237 mL Oral TID WC  . fentaNYL  12.5 mcg Transdermal Q72H  . fluconazole (DIFLUCAN) IV  200 mg Intravenous Q24H  . fluticasone  2 spray Each Nare Daily  . metoprolol tartrate  12.5 mg Oral BID  . pantoprazole  40 mg Oral Daily  . potassium chloride  40 mEq Oral BID  . predniSONE  5 mg Oral Q breakfast  . sodium chloride  10-40 mL Intracatheter Q12H  . sodium chloride  3 mL Intravenous Q12H  . [START ON 12/02/2013] vancomycin  1,250 mg Intravenous Q24H  . vancomycin  2,000 mg Intravenous Once   ZOX:[id number], acetaminophen, calcium carbonate, hydrALAZINE, ipratropium-albuterol, lidocaine, loperamide, menthol-cetylpyridinium, morphine injection, ondansetron (ZOFRAN) IV, ondansetron, oxyCODONE, promethazine, senna-docusate, sodium chloride  Results for orders placed during the hospital encounter of 11/11/2013 (from the past 48 hour(s))  CBC     Status: Abnormal   Collection Time    11/30/13  5:30 AM      Result Value Ref Range   WBC 18.0 (*)  4.0 - 10.5 K/uL   RBC 2.77 (*) 3.87 - 5.11 MIL/uL   Hemoglobin 7.9 (*) 12.0 - 15.0 g/dL   HCT 24.9 (*) 36.0 - 46.0 %   MCV 89.9  78.0 - 100.0 fL   MCH 28.5  26.0 - 34.0 pg   MCHC 31.7  30.0 - 36.0 g/dL   RDW 22.0 (*) 11.5 - 15.5 %   Platelets 21 (*) 150 - 400 K/uL   Comment: REPEATED TO VERIFY     CRITICAL VALUE NOTED.  VALUE IS CONSISTENT WITH PREVIOUSLY REPORTED AND CALLED VALUE.  LACTATE DEHYDROGENASE     Status: Abnormal   Collection Time    11/30/13  5:30 AM      Result Value Ref Range   LDH 566 (*) 94 - 250 U/L   COMPREHENSIVE METABOLIC PANEL     Status: Abnormal   Collection Time    11/30/13  5:30 AM      Result Value Ref Range   Sodium 139  137 - 147 mEq/L   Potassium 4.0  3.7 - 5.3 mEq/L   Comment: DELTA CHECK NOTED     REPEATED TO VERIFY     NO VISIBLE HEMOLYSIS   Chloride 99  96 - 112 mEq/L   CO2 29  19 - 32 mEq/L   Glucose, Bld 101 (*) 70 - 99 mg/dL   BUN 17  6 - 23 mg/dL   Creatinine, Ser 1.19 (*) 0.50 - 1.10 mg/dL   Calcium 8.7  8.4 - 10.5 mg/dL   Total Protein 6.4  6.0 - 8.3 g/dL   Albumin 2.2 (*) 3.5 - 5.2 g/dL   AST 14  0 - 37 U/L   ALT 18  0 - 35 U/L   Alkaline Phosphatase 69  39 - 117 U/L   Total Bilirubin 1.0  0.3 - 1.2 mg/dL   GFR calc non Af Amer 49 (*) >90 mL/min   GFR calc Af Amer 57 (*) >90 mL/min   Comment: (NOTE)     The eGFR has been calculated using the CKD EPI equation.     This calculation has not been validated in all clinical situations.     eGFR's persistently <90 mL/min signify possible Chronic Kidney     Disease.  FUNGUS CULTURE W SMEAR     Status: None   Collection Time    11/30/13 12:25 PM      Result Value Ref Range   Specimen Description ORAL TONGUE ULCER     Special Requests Immunocompromised     Fungal Smear       Value: NO YEAST OR FUNGAL ELEMENTS SEEN     Performed at [facility]   Culture       Value: Culture in Progress for 7 days     Performed at [facility]   Report Status PENDING    WOUND CULTURE     Status: None   Collection Time    11/30/13 12:25 PM      Result Value Ref Range   Specimen Description ORAL TONGUE ULCER     Special Requests Immunocompromised     Gram Stain       Value: NO WBC SEEN     RARE SQUAMOUS EPITHELIAL CELLS PRESENT     MODERATE GRAM POSITIVE RODS     Performed at [facility]   Culture       Value: Culture reincubated for better growth     Performed at [facility]   Report Status PENDING    BASIC METABOLIC PANEL     Status: Abnormal   Collection Time    12/01/13   5:12 AM      Result Value Ref Range   Sodium 139  137 - 147 mEq/L   Potassium 4.1  3.7 - 5.3 mEq/L   Chloride 98  96 - 112 mEq/L   CO2 29  19 - 32 mEq/L   Glucose, Bld 96  70 - 99 mg/dL   BUN 19  6 - 23 mg/dL   Creatinine, Ser 1.56 (*) 0.50 - 1.10 mg/dL   Calcium 8.5  8.4 - 10.5 mg/dL   GFR calc non Af Amer 35 (*) >90 mL/min   GFR calc Af Amer 41 (*) >90 mL/min   Comment: (NOTE)     The eGFR has been calculated using the CKD EPI equation.     This calculation has not been validated in all clinical situations.     eGFR's persistently <90 mL/min signify possible Chronic Kidney     Disease.  LACTATE DEHYDROGENASE     Status: Abnormal   Collection Time    12/01/13  5:12 AM      Result Value Ref Range   LDH 642 (*) 94 - 250 U/L  CBC WITH DIFFERENTIAL     Status: Abnormal   Collection Time    12/01/13  5:12 AM      Result Value Ref Range   WBC 17.2 (*) 4.0 - 10.5 K/uL   RBC 2.52 (*) 3.87 - 5.11 MIL/uL   Hemoglobin 7.2 (*) 12.0 - 15.0 g/dL   HCT 22.7 (*) 36.0 - 46.0 %   MCV 90.1  78.0 - 100.0 fL   MCH 28.6  26.0 - 34.0 pg   MCHC 31.7  30.0 - 36.0 g/dL   RDW 22.1 (*) 11.5 - 15.5 %   Platelets 16 (*) 150 - 400 K/uL   Comment: SPECIMEN CHECKED FOR CLOTS     REPEATED TO VERIFY     DELTA CHECK NOTED     PLATELET COUNT CONFIRMED BY SMEAR     CRITICAL RESULT CALLED TO, READ BACK BY AND VERIFIED WITH:     C. BOOHER RN AT 0735 ON 03.06.15 BY SHUEA   Neutrophils Relative % 76  43 - 77 %   Lymphocytes Relative 17  12 - 46 %   Monocytes Relative 4  3 - 12 %   Eosinophils Relative 2  0 - 5 %   Basophils Relative 1  0 - 1 %   Neutro Abs 13.1 (*) 1.7 - 7.7 K/uL   Lymphs Abs 2.9  0.7 - 4.0 K/uL   Monocytes Absolute 0.7  0.1 - 1.0 K/uL   Eosinophils Absolute 0.3  0.0 - 0.7 K/uL   Basophils Absolute 0.2 (*) 0.0 - 0.1 K/uL   WBC Morphology       Value: MARKED LEFT SHIFT (>5% METAS,MYELOS AND PROS, OCC BLAST NOTED)    Ct Chest Wo Contrast  12/01/2013   CLINICAL DATA:  Fever and cough   EXAM: CT CHEST WITHOUT CONTRAST  TECHNIQUE: Multidetector CT imaging of the chest was performed following the standard protocol without IV contrast.  COMPARISON:  11/20/2013  FINDINGS: Left PICC line tip is in the mid right atrium.  No axillary lymphadenopathy. Scattered small lymph nodes in the upper mediastinum are unchanged. Heart size is stable. No pericardial or left pleural effusion.  The moderate right pleural effusion has increased  slightly in size in the interval. The right upper lobe collapse/ consolidation persist. There has been interval development of central airspace disease in the left upper lobe tracking back to the major fissure. There is posterior left lower lobe collapse/consolidation.  Well-defined hypo attenuating lesions in the liver are again noted. One of the larger lesions is in the posterior medial right liver, measuring 2.5 cm and averaging air water density. The liver lesions are likely cysts. There is thickening/enlargement of both adrenal glands, as before although neither gland has been completely visualized.  Bone windows reveal no worrisome lytic or sclerotic osseous lesions.  IMPRESSION: Persistent right upper lobe collapse/ consolidation with interval development of central airspace disease in the left upper lobe. Imaging features would be compatible with pneumonia although lymphoma involvement is not completely excluded by imaging.  Multiple hepatic cysts.  Stable appearance of bilateral adrenal enlargement.   Electronically Signed   By: Misty Stanley M.D.   On: 12/01/2013 12:04   Dg Chest Port 1 View  11/30/2013   CLINICAL DATA:  Weakness and cough  EXAM: PORTABLE CHEST - 1 VIEW  COMPARISON:  DG CHEST 2 VIEW dated 11/27/2013  FINDINGS: The left lung is adequately inflated and exhibits mildly increased interstitial markings but no focal infiltrate. On the right there is persistent perihilar opacity following along the minor fissure. In the infrahilar region on the right there  remains increased density which may reflect atelectasis. The costophrenic angle on the right is less distinct today. The cardiac silhouette is top-normal in size. The pulmonary vascularity is not clearly engorged. The left-sided PICC line tip appears unchanged lying in the midportion of the SVC.  IMPRESSION: 1. There is persistent abnormality in the right hemithorax in the perihilar and infrahilar regions that suggests atelectasis or infiltrate. The lateral costophrenic angle is less distinct which may reflect atelectasis or fluid. When the patient can tolerate the procedure, a PA and lateral chest x-ray with deep inspiration would be of value. 2. The cardiac silhouette is top-normal in size. The interstitial markings of the left lung are mildly increased as compared to the previous study but there is no evidence of pneumonia.   Electronically Signed   By: David  Martinique   On: 11/30/2013 08:18   Review of Systems: As above, plus Constitutional: Negative for fever.  Gastrointestinal: Positive for abdominal pain.  Genitourinary: Positive for dysuria.  Musculoskeletal: Positive for back pain, joint pain and myalgias.  Neurological: Positive for tingling and sensory change. Negative for headaches.   Blood pressure 122/68, pulse 126, temperature 103.1 F (39.5 C), temperature source Rectal, resp. rate 20, height 5' (1.524 m), weight 203 lb 4.2 oz (92.2 kg), SpO2 91.00%. Physical Exam  Constitutional: A&O x3.  NAD Head: Normocephalic and atraumatic.  Eyes: PERRL, EOMI. Ears: Normal auricles and EACs. Nose: Normal mucosa. No mass. OC: A large 1cm irregular size ulcer noted the right lateral tongue.  Fibrinoid exudate noted. No palpable mass. Neck: No JVD present. No tracheal deviation present. No thyromegaly present.  Cardiovascular: Normal rate.  Lymphadenopathy: She has no cervical adenopathy.  Neurological: No cranial nerve deficit.  Psychiatric: Apropriate affect.   Assessment/Plan: Large right  lateral/dorsal tongue ulcer.  It is likely inflammatory.  Neoplasm is less likely based on the acute onset and location. Will perform a bedside biopsy.  Supportive care for now.  Dhrithi Riche,SUI W 12/01/2013, 12:09 PM

## 2013-12-01 NOTE — Progress Notes (Signed)
Left upper arm PICC removed without difficulty, site WNL. PICC tip culture contaminated, not sent to lab RN made aware. Will continue to monitor.

## 2013-12-01 NOTE — Progress Notes (Signed)
ANTIBIOTIC CONSULT NOTE - INITIAL  Pharmacy Consult for vancomycin Indication: suspected PICC-associated infection  Allergies  Allergen Reactions  . Penicillins Hives and Rash  . Contrast Media [Iodinated Diagnostic Agents]     Patient Measurements: Height: 5' (152.4 cm) Weight: 203 lb 4.2 oz (92.2 kg) IBW/kg (Calculated) : 45.5   Vital Signs: Temp: 103.1 F (39.5 C) (03/06 0538) Temp src: Rectal (03/06 0538) BP: 122/68 mmHg (03/06 0538) Pulse Rate: 126 (03/06 0538) Intake/Output from previous day: 03/05 0701 - 03/06 0700 In: 413.8 [IV Piggyback:413.8] Out: 3800 [Urine:3800] Intake/Output from this shift:    Labs:  Recent Labs  11/29/13 0515 11/30/13 0530 12/01/13 0512  WBC 16.4* 18.0* 17.2*  HGB 7.9* 7.9* 7.2*  PLT 25* 21* 16*  CREATININE 1.03 1.19* 1.56*   Estimated Creatinine Clearance: 39.4 ml/min (by C-G formula based on Cr of 1.56). No results found for this basename: VANCOTROUGH, Corlis Leak, VANCORANDOM, GENTTROUGH, GENTPEAK, GENTRANDOM, TOBRATROUGH, TOBRAPEAK, TOBRARND, AMIKACINPEAK, AMIKACINTROU, AMIKACIN,  in the last 72 hours   Microbiology: Recent Results (from the past 720 hour(s))  CULTURE, BLOOD (ROUTINE X 2)     Status: None   Collection Time    11/08/13 12:40 AM      Result Value Ref Range Status   Specimen Description BLOOD LEFT ANTECUBITAL   Final   Special Requests BOTTLES DRAWN AEROBIC AND ANAEROBIC 3CC   Final   Culture  Setup Time     Final   Value: 11/08/2013 03:32     Performed at [facility]   Culture     Final   Value: NO GROWTH 5 DAYS     Performed at [facility]   Report Status 11/14/2013 FINAL   Final  CULTURE, BLOOD (ROUTINE X 2)     Status: None   Collection Time    11/08/13 12:45 AM      Result Value Ref Range Status   Specimen Description BLOOD LEFT HAND   Final   Special Requests BOTTLES DRAWN AEROBIC AND ANAEROBIC 3CC   Final   Culture  Setup Time     Final   Value: 11/08/2013 03:31     Performed  at [facility]   Culture     Final   Value: NO GROWTH 5 DAYS     Performed at [facility]   Report Status 11/14/2013 FINAL   Final  RESPIRATORY VIRUS PANEL     Status: None   Collection Time    11/11/13 12:00 AM      Result Value Ref Range Status   Source - RVPAN NASOPHARYNGEAL   Final   Respiratory Syncytial Virus A NOT DETECTED   Final   Respiratory Syncytial Virus B NOT DETECTED   Final   Influenza A NOT DETECTED   Final   Influenza B NOT DETECTED   Final   Parainfluenza 1 NOT DETECTED   Final   Parainfluenza 2 NOT DETECTED   Final   Parainfluenza 3 NOT DETECTED   Final   Metapneumovirus NOT DETECTED   Final   Rhinovirus NOT DETECTED   Final   Adenovirus NOT DETECTED   Final   Influenza A H1 NOT DETECTED   Final   Influenza A H3 NOT DETECTED   Final   Comment: (NOTE)           Normal Reference Range for each Analyte: NOT DETECTED     Testing performed using the Luminex xTAG Respiratory Viral Panel test     kit.  This test was developed and its performance characteristics determined     by [facility]. It has not been cleared or approved by the [place]     Food and Drug Administration. This test is used for clinical purposes.     It should not be regarded as investigational or for research. This     laboratory is certified under the [place] (CLIA) as qualified to perform high complexity     clinical laboratory testing.     Performed at [place], BLOOD (ROUTINE X 2)     Status: None   Collection Time    11/13/13  6:20 AM      Result Value Ref Range Status   Specimen Description BLOOD LEFT HAND   Final   Special Requests     Final   Value: BOTTLES DRAWN AEROBIC AND ANAEROBIC [place] [facility] 3CC ANA   Culture  Setup Time     Final   Value: 11/13/2013 08:35     Performed at [facility]   Culture     Final   Value: NO GROWTH 5 DAYS     Performed at [facility]   Report  Status 11/19/2013 FINAL   Final  CULTURE, BLOOD (ROUTINE X 2)     Status: None   Collection Time    11/13/13  6:35 AM      Result Value Ref Range Status   Specimen Description BLOOD LEFT HAND   Final   Special Requests     Final   Value: BOTTLES DRAWN AEROBIC AND ANAEROBIC [place] A[facility] 3CC ANA   Culture  Setup Time     Final   Value: 11/13/2013 08:35     Performed at [facility]   Culture     Final   Value: NO GROWTH 5 DAYS     Performed at [facility]   Report Status 11/19/2013 FINAL   Final  MRSA PCR SCREENING     Status: None   Collection Time    11/13/13  6:04 PM      Result Value Ref Range Status   MRSA by PCR NEGATIVE  NEGATIVE Final   Comment:            The GeneXpert MRSA Assay (FDA     approved for NASAL specimens     only), is one component of a     comprehensive MRSA colonization     surveillance program. It is not     intended to diagnose MRSA     infection nor to guide or     monitor treatment for     MRSA infections.  URINE CULTURE     Status: None   Collection Time    11/13/13  8:55 PM      Result Value Ref Range Status   Specimen Description URINE, CATHETERIZED   Final   Special Requests NONE   Final   Culture  Setup Time     Final   Value: 11/14/2013 01:07     Performed at [place]     Final   Value: NO GROWTH     Performed at [facility]   Culture     Final   Value: NO GROWTH     Performed at [facility]   Report Status 11/14/2013 FINAL   Final  CULTURE, EXPECTORATED SPUTUM-ASSESSMENT  Status: None   Collection Time    11/14/13  4:36 PM      Result Value Ref Range Status   Specimen Description SPUTUM   Final   Special Requests Immunocompromised   Final   Sputum evaluation     Final   Value: THIS SPECIMEN IS ACCEPTABLE. RESPIRATORY CULTURE REPORT TO FOLLOW.   Report Status 11/14/2013 FINAL   Final  CULTURE, RESPIRATORY (NON-EXPECTORATED)     Status: None   Collection Time    11/14/13   4:36 PM      Result Value Ref Range Status   Specimen Description SPUTUM   Final   Special Requests NONE   Final   Gram Stain     Final   Value: RARE WBC PRESENT, PREDOMINANTLY PMN     RARE SQUAMOUS EPITHELIAL CELLS PRESENT     NO ORGANISMS SEEN     Performed at [facility]   Culture     Final   Value: NORMAL OROPHARYNGEAL FLORA     Performed at [facility]   Report Status 11/17/2013 FINAL   Final  HERPES SIMPLEX VIRUS CULTURE     Status: None   Collection Time    11/14/13  6:49 PM      Result Value Ref Range Status   Specimen Description MOUTH   Final   Special Requests Immunocompromised   Final   Culture     Final   Value: No Herpes Simplex Virus detected.     Performed at [facility]   Report Status 11/16/2013 FINAL   Final  VIRAL CULTURE VIRC     Status: None   Collection Time    11/22/13 11:46 AM      Result Value Ref Range Status   Specimen Description ULCER MOUTH   Final   Special Requests Immunocompromised   Final   Culture     Final   Value: CONTINUING TO HOLD     Note:    The current Enterovirus culture system does not detect Enterovirus D68. If Enterovirus D68 is suspected, please call client services to add the test for Enterovirus PCR (Test code [id number]).     Performed at [facility]   Report Status PENDING   Incomplete  CLOSTRIDIUM DIFFICILE BY PCR     Status: None   Collection Time    11/22/13  7:34 PM      Result Value Ref Range Status   C difficile by pcr NEGATIVE  NEGATIVE Final   Comment: Performed at [place], BLOOD (SINGLE)     Status: None   Collection Time    11/22/13 11:30 PM      Result Value Ref Range Status   Specimen Description BLOOD PIC LINE   Final   Special Requests BOTTLES DRAWN AEROBIC AND ANAEROBIC   Final   Culture  Setup Time     Final   Value: 11/12/2013 04:04     Performed at [facility]   Culture     Final   Value: NO GROWTH 5 DAYS     Performed at [facility]   Report Status 11/29/2013 FINAL   Final  AFB CULTURE WITH SMEAR     Status: None   Collection Time    11/25/2013  2:54 PM      Result Value Ref Range Status   Specimen Description BRONCHIAL ALVEOLAR LAVAGE   Final   Special Requests Immunocompromised   Final  ACID FAST SMEAR     Final   Value: NO ACID FAST BACILLI SEEN     Performed at [facility]   Culture     Final   Value: CULTURE WILL BE EXAMINED FOR 6 WEEKS BEFORE ISSUING A FINAL REPORT     Performed at [facility]   Report Status PENDING   Incomplete  CULTURE, BAL-QUANTITATIVE     Status: None   Collection Time    10/30/2013  2:54 PM      Result Value Ref Range Status   Specimen Description BRONCHIAL ALVEOLAR LAVAGE   Final   Special Requests Immunocompromised   Final   Gram Stain     Final   Value: FEW WBC PRESENT, PREDOMINANTLY PMN     NO SQUAMOUS EPITHELIAL CELLS SEEN     NO ORGANISMS SEEN     Performed at [facility]     Final   Value: NO GROWTH     Performed at [facility]   Culture     Final   Value: NO GROWTH 2 DAYS     Performed at [facility]   Report Status 11/26/2013 FINAL   Final  FUNGUS CULTURE W SMEAR     Status: None   Collection Time    11/21/2013  2:54 PM      Result Value Ref Range Status   Specimen Description BRONCHIAL WASHINGS   Final   Special Requests Immunocompromised   Final   Fungal Smear     Final   Value: NO YEAST OR FUNGAL ELEMENTS SEEN     Performed at [facility]   Culture     Final   Value: CULTURE IN PROGRESS FOR FOUR WEEKS     Performed at [facility]   Report Status PENDING   Incomplete  PNEUMOCYSTIS JIROVECI SMEAR BY DFA     Status: None   Collection Time    11/22/2013  2:54 PM      Result Value Ref Range Status   Specimen Source-[facility] BRONCHIAL ALVEOLAR LAVAGE   Final   Pneumocystis jiroveci Ag NEGATIVE   Final   Comment: Performed at [facility]  LEGIONELLA CULTURE      Status: None   Collection Time    10/29/2013  2:54 PM      Result Value Ref Range Status   Specimen Description BRONCHIAL WASHINGS   Final   Special Requests Immunocompromised   Final   Culture     Final   Value: NO LEGIONELLA ISOLATED     Performed at [facility]   Report Status 11/29/2013 FINAL   Final  WOUND CULTURE     Status: None   Collection Time    11/30/13 12:25 PM      Result Value Ref Range Status   Specimen Description ORAL TONGUE ULCER   Final   Special Requests Immunocompromised   Final   Gram Stain     Final   Value: NO WBC SEEN     RARE SQUAMOUS EPITHELIAL CELLS PRESENT     MODERATE GRAM POSITIVE RODS     Performed at [facility]   Culture     Final   Value: Culture reincubated for better growth     Performed at [facility]   Report Status PENDING   Incomplete    Medical History: Past Medical History  Diagnosis Date  . Cancer   .  Cough productive of clear sputum 01/04/2013  . Fibromyalgia     Medications:  Scheduled:  . acyclovir  5 mg/kg (Ideal) Intravenous Q8H  . antiseptic oral rinse  15 mL Mouth Rinse q12n4p  . ceFEPime (MAXIPIME) IV  2 g Intravenous STAT  . chlorhexidine  15 mL Mouth Rinse BID  . dronabinol  5 mg Oral BID AC  . DULoxetine  60 mg Oral QPM  . feeding supplement (ENSURE COMPLETE)  237 mL Oral TID WC  . fentaNYL  12.5 mcg Transdermal Q72H  . fluconazole (DIFLUCAN) IV  200 mg Intravenous Q24H  . fluticasone  2 spray Each Nare Daily  . metoprolol tartrate  12.5 mg Oral BID  . pantoprazole  40 mg Oral Daily  . potassium chloride  40 mEq Oral BID  . predniSONE  5 mg Oral Q breakfast  . sodium chloride  10-40 mL Intracatheter Q12H  . sodium chloride  3 mL Intravenous Q12H  . [START ON 12/02/2013] vancomycin  1,250 mg Intravenous Q24H  . 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  Orders received to begin empiric vancomycin with pharmacy dosing assistance for probable PICC-associated infection.   PICC removal and blood cultures were also ordered.  Prior vancomycin dosing records, serum creatinine trends, and vancomycin troughs from current admission were reviewed  Increasing SCr noted (from 1.19 to 1.56 in past 24 hrs)  Goal of Therapy:  Appropriate antibiotic dosing for renal function; eradication of infection Vancomycin trough 15-20  Plan:  1. Vancomycin 2000 mg IV x 1 only this AM 2. Vancomycin 1250 mg IV q24h, next dose tomorrow at 12 noon 3. Review serum creatinine tomorrow AM before vancomycin dose administered and make further dosage adjustments if needed. 4. Check vancomycin trough when appropriate, likely prior to steady-state given unstable SCr.  Clayburn Pert, PharmD, BCPS Pager: [phone] 12/01/2013  8:04 AM  ADDENDUM: Orders received from [place] attending MD to add empiric cefepime with pharmacy dosing assistance.   Plans for ID consult noted.  Plan: 1. Cefepime 2 grams IV stat, then 1 gram q12h (adjusted for CrCl 30-60) 2. Await orders / guidance from ID physician.  Clayburn Pert, PharmD, BCPS Pager: [phone] 12/01/2013  9:45 AM

## 2013-12-01 NOTE — Progress Notes (Signed)
Now with recurrent temperature spike. Temperature went up to 103.1 I think we're going to have to get the PICC line out. We will restart vancomycin. We will do blood cultures and culture the PICC line tip.  She, unfortunately, will need IV access again. We will see if the IV team to place another PICC line. We may need radiology to do this.  She looks better than I would've thought. However, she continued to decline quickly. This is why we need to get this PICC line out. Pharmacy will help with the vancomycin.  The ulcer on the right side of her tongue is growing. I think this will have to be biopsied. Am not sure what this could be. Of course, her platelet count is only 16,000. We will need to give her platelets.  Please get ENT to see if they will look at this ulcer and biopsy it.    There still is discomfort in her left hip.  Her hemoglobin is 7.2. I will hold on transfusing.  There is some or wheezing. Her chest x-ray looked better yesterday.  Her blood pressure is still doing okay. Heart rate is a to 126. Unfortunately, if she does as she has done in the past, she can certainly end up in the ICU again.  On her exam, her lungs do have some wheezes. There is good air movement. Cardiac exam is tachycardic. Abdomen is slightly distended. Bowel sounds are present. She does have 1-2+ edema in her legs. This is chronic. With her oral exam, the ulcer on the right side of her tongue soon-to-be growing. Cultures were taken and are pending.  This is still incredibly complicated. We now have this temperature worry about and PICC line to take out. We need IV access. We need to restart IV antibiotics. We need to give her platelets.

## 2013-12-01 NOTE — Progress Notes (Signed)
  Peripherally Inserted Central Catheter/Midline Placement  The IV Nurse has discussed with the patient and/or persons authorized to consent for the patient, the purpose of this procedure and the potential benefits and risks involved with this procedure.  The benefits include less needle sticks, lab draws from the catheter and patient may be discharged home with the catheter.  Risks include, but not limited to, infection, bleeding, blood clot (thrombus formation), and puncture of an artery; nerve damage and irregular heat beat.  Alternatives to this procedure were also discussed.  PICC/Midline Placement Documentation  PICC Triple Lumen 54/62/70 PICC Left Basilic 46 cm 5 cm (Active)  Indication for Insertion or Continuance of Line Poor Vasculature-patient has had multiple peripheral attempts or PIVs lasting less than 24 hours 11/26/2013  6:55 PM  Exposed Catheter (cm) 5 cm 11/26/2013  6:55 PM  Site Assessment Clean;Dry;Intact 12/01/2013  7:23 AM  Lumen #1 Status Flushed;Saline locked 12/01/2013  5:00 AM  Lumen #2 Status Flushed;Saline locked 12/01/2013  5:00 AM  Lumen #3 Status Infusing 12/01/2013  5:00 AM  Dressing Type Transparent 12/01/2013  7:23 AM  Dressing Status Clean;Dry;Intact 12/01/2013  7:23 AM  Line Care Cap(s) changed 11/29/2013  5:15 AM  Dressing Intervention Dressing changed;Antimicrobial disc changed 11/25/2013 12:48 PM  Dressing Change Due 12/02/13 11/30/2013  5:30 AM       Jule Economy Horton 12/01/2013, 12:54 PM

## 2013-12-01 NOTE — Progress Notes (Signed)
Patient resting in bed without pain in mouth which she states is numb.  Encouraged to make staff aware as the feeling returns so that we might medicate her for pain.  No bleeding observed.

## 2013-12-01 NOTE — Progress Notes (Addendum)
[facility] PROGRESS NOTE  Lacey [name] RDE:[id number] DOB: [birth year] DOA: 11/09/2013 PCP: Vena Austria, MD  Brief summary  58 y.o. 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 -ID was consulted and PCCM followed. She underwent bronchoscopy, however, all cultures were negative. She will have completed an additional 7 days of antibiotics on 3/3.  -Additionally, Dr. Marin Olp is determining what treatment to initiate for her MDS as she has required frequent transfusions.   Assessment/Plan  1. Myelodysplastic syndrome in setting of Hodgkin Lymphoma:  -Refractory anemia and thrombocytopenia requiring almost daily blood product transfusions, Bone Marrow Biopsy: HYPERCELLULAR BONE MARROW FOR AGE WITH DYSPOIETIC CHANGES. myelodysplastic/myeloproliferative process, particularly chronic myelomonocytic leukemia - Appreciate Dr Jonette Eva assistance.  - on frequent daily transfusions as patient is developing antibodies. Need treatment solution or palliative care plan. Defer to Oncology.   2. Recurrent fever/septic shock with unknown source. Likely due dysfunction leukocytes as above  -CT chest on 2/23: Persistent consolidation RML with new peribronchovascular opacities suggestive of bronchopneumonia. Underwent bronch on 2/26; BCx NGTD,  Diff PCR neg - BAL: PCP neg, Bacterial, fungal, and AFB cultures NGTD  - Continued vanc and cefepime through 3/3, then stopped  - 3/6: febrile; repeat blood c/s; restarted IV atx (vanc/cefepime), antifungals, acyclovir for tongue ulcer - 3/6 will repeat CT chest,UA, urine cultures;  d/c PICC, culture  picc; re consult ID  3. Acute on chronic diastolic heart failure due to IVF from resuscitation. Echo (2/15): Preserved EF and grade 1 diastolic heart failure -anasarca, Pt was lasix, hold lasix 3/6 due to AKI; Daily weights and strict I/O  4. 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 Acute Renal Failure due to septic shock; RUS wnl. Hypervolemic  - hold lasix 3/6, close monitor  6. Spontaneous retroperitoneal bleed in setting of thrombocytopenia; hemorrhage into her left iliopsoas muscle which caused weakness and pain of her left leg and LLQ - Started fentanyl patch on 3/2  - Continue ultram, but consider escalating if patient desires  7. Right upper extremity superficial thrombosis, stable  - Not on ac due to thrombocytopenia  8. Transaminitis due to septic shock, resolved.  9. Urge and stress incontinence; Foley placed for comfort  10. Tongue ulcer; cont as above; consulted ENT for biopsy evaluation   Proph: Hold for thrombocytopenia, spont retroperitoneal bleed  Code Status: full  Family Communication: patient alone, husband at the bedside yesterday  Disposition Plan: Clinically improving. PT/OT recommending CIR vs. SNF. Need plan for marrow failure, however, because she is requiring frequent blood and plt transfusions.  Consultants:  Dr. Burney Gauze (oncology)  Dr Gibson Ramp (Surgery) curbside consult  Dr. Carlyle Basques (infectious disease)  Procedures:  11/12/2013 right upper extremity Doppler  No evidence of deep vein thrombosis involving the right upper extremity, left subclavian vein, and left internal jugular vein. There is superficial thrombosis noted in the right cephalic vein.  -6/31 Bone marrow biopsy report pending  Echocardiogram 01/10/2013  - Left ventricle: mild focal basal hypertrophy of the septum.  -LVEF= ejection fraction was 55%. Wall motion was normal; there were no regional wall motion abnormalities.   -(grade 1 diastolic dysfunction).  - Aortic valve: There was  no stenosis. Trivial regurgitation. - Mitral valve: Trivial regurgitation. - Left atrium: The atrium was mildly dilated. - Right ventricle: The cavity size was normal. Systolic function was normal. - Tricuspid valve: Peak RV-RA gradient: 19mm Hg (S). - Pulmonary arteries: PA peak pressure: 75mm Hg (S). - Inferior vena cava: The vessel was normal in size; the respirophasic diameter changes were in the normal range (=50%); findings are consistent with normal central venous pressure.  Antibiotics:  Aztreonam 2/11>> stopped 2/13  Ciprofloxacin 2/11>> stopped 2/15  Vancomycin 2/11>> stopped 2/15  Primaxin>> 2/16>> 2/21  Voriconazole 2/16>> stopped 2/18, restarted 2/26 <<<3/3 Vancomycin 2/17>> 2/24, restarted 2/26 >>>3/3  Vanc3/6<<<< cefepime 3/6<<< fluconazole 3/5<<<< Acyclovir 3/5<<<<    Consultants:  Oncology  HPI/Subjective: alert  Objective: Filed Vitals:   12/01/13 0538  BP: 122/68  Pulse: 126  Temp: 103.1 F (39.5 C)  Resp: 20    Intake/Output Summary (Last 24 hours) at 12/01/13 0845 Last data filed at 12/01/13 0631  Gross per 24 hour  Intake  413.8 ml  Output   3800 ml  Net -3386.2 ml   Filed Weights   11/26/13 0736 11/29/13 0616 12/01/13 0806  Weight: 101 kg (222 lb 10.6 oz) 103.2 kg (227 lb 8.2 oz) 92.2 kg (203 lb 4.2 oz)    Exam:   General:  alert  Cardiovascular: s1,s2 rrr  Respiratory: few LL crackles   Abdomen: soft, nt, nd   Musculoskeletal: LE edema   Data Reviewed: Basic Metabolic Panel:  Recent Labs Lab 11/25/13 0414  11/27/13 0428 11/28/13 0510 11/29/13 0515 11/30/13 0530 12/01/13 0512  NA 144  < > 148* 145 142 139 139  K 4.2  < > 3.1* 3.2* 3.0* 4.0 4.1  CL 105  < > 109 104 101 99 98  CO2 27  < > $R'29 31 31 29 29  'be$ GLUCOSE 146*  < > 97 109* 145* 101* 96  BUN 32*  < > 30* 24* $Remov'20 17 19  'tcniMm$ CREATININE 1.77*  < > 1.08 0.92 1.03 1.19* 1.56*  CALCIUM 8.3*  < > 8.5 8.4  8.3* 8.7 8.5  MG 2.4  --   --   --   --   --   --   < > = values in this interval not displayed. Liver Function Tests:  Recent Labs Lab 11/25/13 0414 11/30/13 0530  AST 34 14  ALT 40* 18  ALKPHOS 68 69  BILITOT 0.4 1.0  PROT 6.6 6.4  ALBUMIN 2.2* 2.2*   No results found for this basename: LIPASE, AMYLASE,  in the last 168 hours No results found for this basename: AMMONIA,  in the last 168 hours CBC:  Recent Labs Lab 11/25/13 0414  11/27/13 0428 11/27/13 2337 11/28/13 0510 11/29/13 0515 11/30/13 0530 12/01/13 0512  WBC 15.9*  < > 15.9*  --  17.6* 16.4* 18.0* 17.2*  NEUTROABS 12.8*  --   --   --   --   --   --  13.1*  HGB 8.3*  < > 8.5*  --  8.7* 7.9* 7.9* 7.2*  HCT 25.7*  < > 26.4*  --  26.9* 24.9* 24.9* 22.7*  MCV 87.1  < > 88.6  --  88.2 89.6 89.9 90.1  PLT 70*  < > 28* 79* 62* 25* 21* 16*  < > = values in this interval not displayed. Cardiac Enzymes: No results found for this basename: CKTOTAL, CKMB, CKMBINDEX, TROPONINI,  in the last 168 hours BNP (last 3 results)  Recent  Labs  11/14/13 0435 11/17/13 0800 11/20/13 0515  PROBNP 2977.0* 9541.0* 3178.0*   CBG: No results found for this basename: GLUCAP,  in the last 168 hours  Recent Results (from the past 240 hour(s))  VIRAL CULTURE VIRC     Status: None   Collection Time    11/22/13 11:46 AM      Result Value Ref Range Status   Specimen Description ULCER MOUTH   Final   Special Requests Immunocompromised   Final   Culture     Final   Value: CONTINUING TO HOLD     Note:    The current Enterovirus culture system does not detect Enterovirus D68. If Enterovirus D68 is suspected, please call client services to add the test for Enterovirus PCR (Test code [phone]).     Performed at [facility]   Report Status PENDING   Incomplete  CLOSTRIDIUM DIFFICILE BY PCR     Status: None   Collection Time    11/22/13  7:34 PM      Result Value Ref Range Status   C difficile by pcr NEGATIVE  NEGATIVE Final    Comment: Performed at [place], BLOOD (SINGLE)     Status: None   Collection Time    11/22/13 11:30 PM      Result Value Ref Range Status   Specimen Description BLOOD PIC LINE   Final   Special Requests BOTTLES DRAWN AEROBIC AND ANAEROBIC   Final   Culture  Setup Time     Final   Value: 11/08/2013 04:04     Performed at [facility]   Culture     Final   Value: NO GROWTH 5 DAYS     Performed at [facility]   Report Status 11/29/2013 FINAL   Final  AFB CULTURE WITH SMEAR     Status: None   Collection Time    11/09/2013  2:54 PM      Result Value Ref Range Status   Specimen Description BRONCHIAL ALVEOLAR LAVAGE   Final   Special Requests Immunocompromised   Final   ACID FAST SMEAR     Final   Value: NO ACID FAST BACILLI SEEN     Performed at [facility]   Culture     Final   Value: CULTURE WILL BE EXAMINED FOR 6 WEEKS BEFORE ISSUING A FINAL REPORT     Performed at [facility]   Report Status PENDING   Incomplete  CULTURE, BAL-QUANTITATIVE     Status: None   Collection Time    11/12/2013  2:54 PM      Result Value Ref Range Status   Specimen Description BRONCHIAL ALVEOLAR LAVAGE   Final   Special Requests Immunocompromised   Final   Gram Stain     Final   Value: FEW WBC PRESENT, PREDOMINANTLY PMN     NO SQUAMOUS EPITHELIAL CELLS SEEN     NO ORGANISMS SEEN     Performed at [facility]     Final   Value: NO GROWTH     Performed at [facility]   Culture     Final   Value: NO GROWTH 2 DAYS     Performed at [facility]   Report Status 11/26/2013 FINAL   Final  FUNGUS CULTURE W SMEAR     Status: None   Collection Time    11/22/2013  2:54 PM  Result Value Ref Range Status   Specimen Description BRONCHIAL WASHINGS   Final   Special Requests Immunocompromised   Final   Fungal Smear     Final   Value: NO YEAST OR FUNGAL ELEMENTS SEEN     Performed at [facility]   Culture      Final   Value: CULTURE IN PROGRESS FOR FOUR WEEKS     Performed at [facility]   Report Status PENDING   Incomplete  PNEUMOCYSTIS JIROVECI SMEAR BY DFA     Status: None   Collection Time    10/29/2013  2:54 PM      Result Value Ref Range Status   Specimen Source-PJSRC BRONCHIAL ALVEOLAR LAVAGE   Final   Pneumocystis jiroveci Ag NEGATIVE   Final   Comment: Performed at [facility]  LEGIONELLA CULTURE     Status: None   Collection Time    11/16/2013  2:54 PM      Result Value Ref Range Status   Specimen Description BRONCHIAL WASHINGS   Final   Special Requests Immunocompromised   Final   Culture     Final   Value: NO LEGIONELLA ISOLATED     Performed at [facility]   Report Status 11/29/2013 FINAL   Final  WOUND CULTURE     Status: None   Collection Time    11/30/13 12:25 PM      Result Value Ref Range Status   Specimen Description ORAL TONGUE ULCER   Final   Special Requests Immunocompromised   Final   Gram Stain     Final   Value: NO WBC SEEN     RARE SQUAMOUS EPITHELIAL CELLS PRESENT     MODERATE GRAM POSITIVE RODS     Performed at [facility]     Final   Value: Culture reincubated for better growth     Performed at [facility]   Report Status PENDING   Incomplete     Studies: Dg Chest Port 1 View  11/30/2013   CLINICAL DATA:  Weakness and cough  EXAM: PORTABLE CHEST - 1 VIEW  COMPARISON:  DG CHEST 2 VIEW dated 11/27/2013  FINDINGS: The left lung is adequately inflated and exhibits mildly increased interstitial markings but no focal infiltrate. On the right there is persistent perihilar opacity following along the minor fissure. In the infrahilar region on the right there remains increased density which may reflect atelectasis. The costophrenic angle on the right is less distinct today. The cardiac silhouette is top-normal in size. The pulmonary vascularity is not clearly engorged. The left-sided PICC line tip appears  unchanged lying in the midportion of the SVC.  IMPRESSION: 1. There is persistent abnormality in the right hemithorax in the perihilar and infrahilar regions that suggests atelectasis or infiltrate. The lateral costophrenic angle is less distinct which may reflect atelectasis or fluid. When the patient can tolerate the procedure, a PA and lateral chest x-ray with deep inspiration would be of value. 2. The cardiac silhouette is top-normal in size. The interstitial markings of the left lung are mildly increased as compared to the previous study but there is no evidence of pneumonia.   Electronically Signed   By: David  [place]   On: 11/30/2013 08:18    Scheduled Meds: . acyclovir  5 mg/kg (Ideal) Intravenous Q8H  . antiseptic oral rinse  15 mL Mouth Rinse q12n4p  . chlorhexidine  15 mL Mouth  Rinse BID  . dronabinol  5 mg Oral BID AC  . DULoxetine  60 mg Oral QPM  . feeding supplement (ENSURE COMPLETE)  237 mL Oral TID WC  . fentaNYL  12.5 mcg Transdermal Q72H  . fluconazole (DIFLUCAN) IV  200 mg Intravenous Q24H  . fluticasone  2 spray Each Nare Daily  . furosemide  40 mg Intramuscular Once  . furosemide  40 mg Intravenous BID  . metoprolol tartrate  12.5 mg Oral BID  . pantoprazole  40 mg Oral Daily  . potassium chloride  40 mEq Oral BID  . predniSONE  5 mg Oral Q breakfast  . sodium chloride  10-40 mL Intracatheter Q12H  . sodium chloride  3 mL Intravenous Q12H  . [START ON 12/02/2013] vancomycin  1,250 mg Intravenous Q24H  . vancomycin  2,000 mg Intravenous Once   Continuous Infusions:   Principal Problem:   Septic shock Active Problems:   Hodgkin lymphoma   Myelodysplasia   Adrenal abnormality   Rash and nonspecific skin eruption   DOE (dyspnea on exertion)   Symptomatic anemia   Melena   Adrenal infarction   Hypotension, unspecified   PNA (pneumonia)   Sepsis   Acute renal failure   Pulmonary infiltrate   Fever   Acute on chronic diastolic heart failure    Time spent:  >35 minutes     Kinnie Feil  [facility] Pager [phone]. If 7PM-7AM, please contact night-coverage at [email or web address], password [facility] 12/01/2013, 8:45 AM  LOS: 24 days

## 2013-12-01 NOTE — Progress Notes (Signed)
CRITICAL VALUE ALERT  Critical value received:  Platelet count of 16  Date of notification:  12/01/2013  Time of notification: 0735  Critical value read back yes  Nurse who received alert:  Moreen Fowler  MD notified (1st page):  Ennever  Time of first page: [phone]  MD notified (2nd page)Ennever  Time of second page:[phone]  Responding MD:  Marin Olp  Time MD responded:  [phone]

## 2013-12-01 NOTE — Procedures (Addendum)
Procedure: Incisional biopsy of right lateral tongue.   Anesthesia: local anesthesia with 1% lidocaine with epinephrine.   Description: The patient is placed upright on the hospital bed.  After adequate local anesthesia is achieved, an 11 blade is used to incise 3 biopsy specimens from the right tongue ulcer. The specimens are sent to the path department.  The patient tolerated the procedure well.

## 2013-12-01 NOTE — Progress Notes (Signed)
PT Cancellation Note  ___Treatment cancelled today due to medical issues with patient which prohibited therapy  ___ Treatment cancelled today due to patient receiving procedure or test   ___ Treatment cancelled today due to patient's refusal to participate   _X_ Treatment cancelled today due to PICC line exchange then scheduled for Tongue I&D at bedside  Rica Koyanagi  PTA [facility]  Acute  Rehab Pager      [phone]

## 2013-12-01 NOTE — Progress Notes (Signed)
Rehab admissions - Noted ongoing medical concerns.  Not participating well with therapies.  Not ready for inpatient rehab.  I did tell social worker yesterday to pursue SNF since patient declined and not participating well.  Call me for questions.  #[phone]

## 2013-12-01 NOTE — Progress Notes (Signed)
OT Cancellation Note  Patient Details Name: Lacey [name] MRN: [id number] DOB: 07-05-1956   Cancelled Treatment:    Reason Eval/Treat Not Completed: Medical issues which prohibited therapy.  Pt is s/p biopsy and is having a new PICC inserted.  Will check back next week.  Zamora Colton 12/01/2013, 1:37 PM [name], OTR/L [phone] 12/01/2013

## 2013-12-01 NOTE — Progress Notes (Signed)
Clinical Social Work  Patient was discussed during progression meeting and it was reported that patient might require PMT consult. CSW explained to RN that patient has chosen [facility] and insurance authorization has been received so patient can DC whenever medically stable. CSW will continue to follow and will assist as needed.  [place], [place] [phone]

## 2013-12-02 ENCOUNTER — Inpatient Hospital Stay (HOSPITAL_COMMUNITY): Payer: BC Managed Care – PPO

## 2013-12-02 ENCOUNTER — Encounter (HOSPITAL_COMMUNITY): Payer: Self-pay | Admitting: Internal Medicine

## 2013-12-02 DIAGNOSIS — J9620 Acute and chronic respiratory failure, unspecified whether with hypoxia or hypercapnia: Secondary | ICD-10-CM | POA: Diagnosis not present

## 2013-12-02 DIAGNOSIS — J90 Pleural effusion, not elsewhere classified: Secondary | ICD-10-CM | POA: Diagnosis present

## 2013-12-02 DIAGNOSIS — J9600 Acute respiratory failure, unspecified whether with hypoxia or hypercapnia: Secondary | ICD-10-CM

## 2013-12-02 DIAGNOSIS — R4182 Altered mental status, unspecified: Secondary | ICD-10-CM | POA: Diagnosis present

## 2013-12-02 DIAGNOSIS — I959 Hypotension, unspecified: Secondary | ICD-10-CM | POA: Diagnosis present

## 2013-12-02 DIAGNOSIS — A419 Sepsis, unspecified organism: Secondary | ICD-10-CM | POA: Diagnosis present

## 2013-12-02 DIAGNOSIS — R6520 Severe sepsis without septic shock: Secondary | ICD-10-CM

## 2013-12-02 DIAGNOSIS — D696 Thrombocytopenia, unspecified: Secondary | ICD-10-CM | POA: Diagnosis present

## 2013-12-02 DIAGNOSIS — R0602 Shortness of breath: Secondary | ICD-10-CM

## 2013-12-02 DIAGNOSIS — I82409 Acute embolism and thrombosis of unspecified deep veins of unspecified lower extremity: Secondary | ICD-10-CM | POA: Diagnosis present

## 2013-12-02 LAB — CBC
HEMATOCRIT: 19.4 % — AB (ref 36.0–46.0)
Hemoglobin: 6.1 g/dL — CL (ref 12.0–15.0)
MCH: 28.4 pg (ref 26.0–34.0)
MCHC: 31.4 g/dL (ref 30.0–36.0)
MCV: 90.2 fL (ref 78.0–100.0)
PLATELETS: 60 10*3/uL — AB (ref 150–400)
RBC: 2.15 MIL/uL — ABNORMAL LOW (ref 3.87–5.11)
RDW: 22.1 % — AB (ref 11.5–15.5)
WBC: 12.5 10*3/uL — AB (ref 4.0–10.5)

## 2013-12-02 LAB — COMPREHENSIVE METABOLIC PANEL
ALT: 17 U/L (ref 0–35)
AST: 18 U/L (ref 0–37)
Albumin: 2.1 g/dL — ABNORMAL LOW (ref 3.5–5.2)
Alkaline Phosphatase: 74 U/L (ref 39–117)
BUN: 15 mg/dL (ref 6–23)
CO2: 28 mEq/L (ref 19–32)
CREATININE: 1.62 mg/dL — AB (ref 0.50–1.10)
Calcium: 8.8 mg/dL (ref 8.4–10.5)
Chloride: 100 mEq/L (ref 96–112)
GFR calc non Af Amer: 34 mL/min — ABNORMAL LOW (ref >90)
GFR, EST AFRICAN AMERICAN: 39 mL/min — AB (ref >90)
GLUCOSE: 91 mg/dL (ref 70–99)
Potassium: 3.9 mEq/L (ref 3.7–5.3)
SODIUM: 141 meq/L (ref 137–147)
TOTAL PROTEIN: 6.6 g/dL (ref 6.0–8.3)
Total Bilirubin: 1.8 mg/dL — ABNORMAL HIGH (ref 0.3–1.2)

## 2013-12-02 LAB — CBC WITH DIFFERENTIAL/PLATELET
Basophils Absolute: 0.1 10*3/uL (ref 0.0–0.1)
Basophils Relative: 1 % (ref 0–1)
EOS PCT: 2 % (ref 0–5)
Eosinophils Absolute: 0.3 10*3/uL (ref 0.0–0.7)
HCT: 26.6 % — ABNORMAL LOW (ref 36.0–46.0)
Hemoglobin: 8.9 g/dL — ABNORMAL LOW (ref 12.0–15.0)
LYMPHS ABS: 1.6 10*3/uL (ref 0.7–4.0)
LYMPHS PCT: 12 % (ref 12–46)
MCH: 29.1 pg (ref 26.0–34.0)
MCHC: 33.5 g/dL (ref 30.0–36.0)
MCV: 86.9 fL (ref 78.0–100.0)
MONOS PCT: 4 % (ref 3–12)
Monocytes Absolute: 0.5 10*3/uL (ref 0.1–1.0)
Neutro Abs: 10.7 10*3/uL — ABNORMAL HIGH (ref 1.7–7.7)
Neutrophils Relative %: 81 % — ABNORMAL HIGH (ref 43–77)
PLATELETS: 51 10*3/uL — AB (ref 150–400)
RBC: 3.06 MIL/uL — AB (ref 3.87–5.11)
RDW: 20.2 % — ABNORMAL HIGH (ref 11.5–15.5)
WBC: 13.2 10*3/uL — AB (ref 4.0–10.5)

## 2013-12-02 LAB — URINE CULTURE
Colony Count: NO GROWTH
Culture: NO GROWTH

## 2013-12-02 LAB — LACTIC ACID, PLASMA: Lactic Acid, Venous: 0.7 mmol/L (ref 0.5–2.2)

## 2013-12-02 LAB — BASIC METABOLIC PANEL
BUN: 17 mg/dL (ref 6–23)
CALCIUM: 8.6 mg/dL (ref 8.4–10.5)
CO2: 29 mEq/L (ref 19–32)
CREATININE: 1.54 mg/dL — AB (ref 0.50–1.10)
Chloride: 101 mEq/L (ref 96–112)
GFR calc Af Amer: 42 mL/min — ABNORMAL LOW (ref >90)
GFR, EST NON AFRICAN AMERICAN: 36 mL/min — AB (ref >90)
Glucose, Bld: 94 mg/dL (ref 70–99)
Potassium: 4.3 mEq/L (ref 3.7–5.3)
Sodium: 139 mEq/L (ref 137–147)

## 2013-12-02 LAB — BLOOD GAS, VENOUS
ACID-BASE EXCESS: 4.1 mmol/L — AB (ref 0.0–2.0)
BICARBONATE: 29.5 meq/L — AB (ref 20.0–24.0)
O2 Content: 4 L/min
O2 Saturation: 76.4 %
PH VEN: 7.345 — AB (ref 7.250–7.300)
PO2 VEN: 46.9 mmHg — AB (ref 30.0–45.0)
Patient temperature: 102
TCO2: 28 mmol/L (ref 0–100)
pCO2, Ven: 57.1 mmHg — ABNORMAL HIGH (ref 45.0–50.0)

## 2013-12-02 LAB — PROTIME-INR
INR: 1.33 (ref 0.00–1.49)
Prothrombin Time: 16.2 seconds — ABNORMAL HIGH (ref 11.6–15.2)

## 2013-12-02 LAB — PREPARE RBC (CROSSMATCH)

## 2013-12-02 LAB — WOUND CULTURE: Gram Stain: NONE SEEN

## 2013-12-02 LAB — PHOSPHORUS: PHOSPHORUS: 4.3 mg/dL (ref 2.3–4.6)

## 2013-12-02 LAB — MAGNESIUM: Magnesium: 1.8 mg/dL (ref 1.5–2.5)

## 2013-12-02 LAB — LACTATE DEHYDROGENASE: LDH: 494 U/L — AB (ref 94–250)

## 2013-12-02 LAB — TROPONIN I

## 2013-12-02 MED ORDER — PHENYLEPHRINE HCL 10 MG/ML IJ SOLN
30.0000 ug/min | INTRAVENOUS | Status: DC
  Administered 2013-12-02: 30 ug/min via INTRAVENOUS
  Filled 2013-12-02: qty 4

## 2013-12-02 MED ORDER — CHLORHEXIDINE GLUCONATE 0.12 % MT SOLN
OROMUCOSAL | Status: AC
  Filled 2013-12-02: qty 15

## 2013-12-02 MED ORDER — PHENYLEPHRINE HCL 10 MG/ML IJ SOLN
30.0000 ug/min | INTRAVENOUS | Status: DC
  Filled 2013-12-02 (×2): qty 1

## 2013-12-02 MED ORDER — IPRATROPIUM-ALBUTEROL 0.5-2.5 (3) MG/3ML IN SOLN: 3.0000 mL | RESPIRATORY_TRACT | Status: DC

## 2013-12-02 MED ORDER — ACETAMINOPHEN 10 MG/ML IV SOLN
1000.0000 mg | Freq: Four times a day (QID) | INTRAVENOUS | Status: AC
  Administered 2013-12-02 – 2013-12-03 (×4): 1000 mg via INTRAVENOUS
  Filled 2013-12-02 (×4): qty 100

## 2013-12-02 MED ORDER — FUROSEMIDE 10 MG/ML IJ SOLN: 40.0000 mg | Freq: Every day | INTRAMUSCULAR | Status: DC

## 2013-12-02 MED ORDER — FENTANYL CITRATE 0.05 MG/ML IJ SOLN
50.0000 ug | INTRAMUSCULAR | Status: DC | PRN
  Administered 2013-12-06 – 2013-12-20 (×35): 50 ug via INTRAVENOUS
  Filled 2013-12-02 (×35): qty 2

## 2013-12-02 MED ORDER — HYDROCORTISONE SOD SUCCINATE 100 MG IJ SOLR
100.0000 mg | Freq: Three times a day (TID) | INTRAMUSCULAR | Status: DC
  Administered 2013-12-02 – 2013-12-05 (×8): 100 mg via INTRAVENOUS
  Filled 2013-12-02 (×11): qty 2

## 2013-12-02 MED ORDER — LINEZOLID 2 MG/ML IV SOLN
600.0000 mg | Freq: Two times a day (BID) | INTRAVENOUS | Status: DC
  Administered 2013-12-02 – 2013-12-06 (×8): 600 mg via INTRAVENOUS
  Filled 2013-12-02 (×11): qty 300

## 2013-12-02 MED ORDER — FUROSEMIDE 10 MG/ML IJ SOLN
60.0000 mg | Freq: Two times a day (BID) | INTRAMUSCULAR | Status: DC
Start: 2013-12-03 — End: 2013-12-08
  Administered 2013-12-03 – 2013-12-08 (×11): 60 mg via INTRAVENOUS
  Filled 2013-12-02 (×14): qty 6

## 2013-12-02 MED ORDER — FUROSEMIDE 10 MG/ML IJ SOLN
40.0000 mg | Freq: Once | INTRAMUSCULAR | Status: AC
  Administered 2013-12-02: 40 mg via INTRAVENOUS
  Filled 2013-12-02: qty 4

## 2013-12-02 MED ORDER — METOPROLOL TARTRATE 25 MG PO TABS
25.0000 mg | ORAL_TABLET | Freq: Two times a day (BID) | ORAL | Status: DC
  Filled 2013-12-02: qty 1

## 2013-12-02 MED ORDER — PANTOPRAZOLE SODIUM 40 MG IV SOLR
40.0000 mg | INTRAVENOUS | Status: DC
Start: 2013-12-02 — End: 2013-12-08
  Administered 2013-12-02 – 2013-12-07 (×6): 40 mg via INTRAVENOUS
  Filled 2013-12-02 (×7): qty 40

## 2013-12-02 MED ORDER — LEVALBUTEROL HCL 1.25 MG/0.5ML IN NEBU: 1.2500 mg | INHALATION_SOLUTION | Freq: Three times a day (TID) | RESPIRATORY_TRACT | Status: DC

## 2013-12-02 MED ORDER — SODIUM CHLORIDE 0.9 % IV SOLN
100.0000 mg | INTRAVENOUS | Status: DC
  Administered 2013-12-02 – 2013-12-05 (×4): 100 mg via INTRAVENOUS
  Filled 2013-12-02 (×5): qty 100

## 2013-12-02 MED ORDER — DEXTROSE 5 % IV SOLN
1.0000 g | Freq: Two times a day (BID) | INTRAVENOUS | Status: DC
  Administered 2013-12-02 – 2013-12-06 (×8): 1 g via INTRAVENOUS
  Filled 2013-12-02 (×10): qty 1

## 2013-12-02 NOTE — Progress Notes (Signed)
ANTIBIOTIC CONSULT NOTE - INITIAL  Pharmacy Consult for Zyvox/Micafungin/Zosyn  Indication: HCAP  Allergies  Allergen Reactions  . Penicillins Hives and Rash  . Contrast Media [Iodinated Diagnostic Agents]     Patient Measurements: Height: 5' (152.4 cm) Weight: 199 lb 8.3 oz (90.5 kg) IBW/kg (Calculated) : 45.5 Adjusted Body Weight: n/a  Vital Signs: Temp: 98.5 F (36.9 C) (03/07 1649) Temp src: Oral (03/07 1649) BP: 108/61 mmHg (03/07 2100) Pulse Rate: 132 (03/07 1900) Intake/Output from previous day: 03/06 0701 - 03/07 0700 In: 500 [Blood:500] Out: 2075 [Urine:2075] Intake/Output from this shift:    Labs:  Recent Labs  11/30/13 0530 12/01/13 0512 12/02/13 0445  WBC 18.0* 17.2* 12.5*  HGB 7.9* 7.2* 6.1*  PLT 21* 16* 60*  CREATININE 1.19* 1.56* 1.54*   Estimated Creatinine Clearance: 39.4 ml/min (by C-G formula based on Cr of 1.54). No results found for this basename: VANCOTROUGH, VANCOPEAK, VANCORANDOM, GENTTROUGH, GENTPEAK, GENTRANDOM, TOBRATROUGH, TOBRAPEAK, TOBRARND, AMIKACINPEAK, AMIKACINTROU, AMIKACIN,  in the last 72 hours   Microbiology: Recent Results (from the past 720 hour(s))  CULTURE, BLOOD (ROUTINE X 2)     Status: None   Collection Time    11/08/13 12:40 AM      Result Value Ref Range Status   Specimen Description BLOOD LEFT ANTECUBITAL   Final   Special Requests BOTTLES DRAWN AEROBIC AND ANAEROBIC 3CC   Final   Culture  Setup Time     Final   Value: 11/08/2013 03:32     Performed at [facility]   Culture     Final   Value: NO GROWTH 5 DAYS     Performed at [facility]   Report Status 11/14/2013 FINAL   Final  CULTURE, BLOOD (ROUTINE X 2)     Status: None   Collection Time    11/08/13 12:45 AM      Result Value Ref Range Status   Specimen Description BLOOD LEFT HAND   Final   Special Requests BOTTLES DRAWN AEROBIC AND ANAEROBIC 3CC   Final   Culture  Setup Time     Final   Value: 11/08/2013 03:31     Performed at  [facility]   Culture     Final   Value: NO GROWTH 5 DAYS     Performed at [facility]   Report Status 11/14/2013 FINAL   Final  RESPIRATORY VIRUS PANEL     Status: None   Collection Time    11/11/13 12:00 AM      Result Value Ref Range Status   Source - RVPAN NASOPHARYNGEAL   Final   Respiratory Syncytial Virus A NOT DETECTED   Final   Respiratory Syncytial Virus B NOT DETECTED   Final   Influenza A NOT DETECTED   Final   Influenza B NOT DETECTED   Final   Parainfluenza 1 NOT DETECTED   Final   Parainfluenza 2 NOT DETECTED   Final   Parainfluenza 3 NOT DETECTED   Final   Metapneumovirus NOT DETECTED   Final   Rhinovirus NOT DETECTED   Final   Adenovirus NOT DETECTED   Final   Influenza A H1 NOT DETECTED   Final   Influenza A H3 NOT DETECTED   Final   Comment: (NOTE)           Normal Reference Range for each Analyte: NOT DETECTED     Testing performed using the Luminex xTAG Respiratory Viral Panel test       kit.     This test was developed and its performance characteristics determined     by [facility]. It has not been cleared or approved by the [place]     Food and Drug Administration. This test is used for clinical purposes.     It should not be regarded as investigational or for research. This     laboratory is certified under the [place] (CLIA) as qualified to perform high complexity     clinical laboratory testing.     Performed at [place], BLOOD (ROUTINE X 2)     Status: None   Collection Time    11/13/13  6:20 AM      Result Value Ref Range Status   Specimen Description BLOOD LEFT HAND   Final   Special Requests     Final   Value: BOTTLES DRAWN AEROBIC AND ANAEROBIC [place] AER 3CC ANA   Culture  Setup Time     Final   Value: 11/13/2013 08:35     Performed at [facility]   Culture     Final   Value: NO GROWTH 5 DAYS     Performed at [facility]   Report  Status 11/19/2013 FINAL   Final  CULTURE, BLOOD (ROUTINE X 2)     Status: None   Collection Time    11/13/13  6:35 AM      Result Value Ref Range Status   Specimen Description BLOOD LEFT HAND   Final   Special Requests     Final   Value: BOTTLES DRAWN AEROBIC AND ANAEROBIC [place] AER 3CC ANA   Culture  Setup Time     Final   Value: 11/13/2013 08:35     Performed at [facility]   Culture     Final   Value: NO GROWTH 5 DAYS     Performed at [facility]   Report Status 11/19/2013 FINAL   Final  MRSA PCR SCREENING     Status: None   Collection Time    11/13/13  6:04 PM      Result Value Ref Range Status   MRSA by PCR NEGATIVE  NEGATIVE Final   Comment:            The GeneXpert MRSA Assay (FDA     approved for NASAL specimens     only), is one component of a     comprehensive MRSA colonization     surveillance program. It is not     intended to diagnose MRSA     infection nor to guide or     monitor treatment for     MRSA infections.  URINE CULTURE     Status: None   Collection Time    11/13/13  8:55 PM      Result Value Ref Range Status   Specimen Description URINE, CATHETERIZED   Final   Special Requests NONE   Final   Culture  Setup Time     Final   Value: 11/14/2013 01:07     Performed at [place]     Final   Value: NO GROWTH     Performed at [facility]   Culture     Final   Value: NO GROWTH     Performed at [facility]   Report Status 11/14/2013 FINAL   Final  CULTURE,  EXPECTORATED SPUTUM-ASSESSMENT     Status: None   Collection Time    11/14/13  4:36 PM      Result Value Ref Range Status   Specimen Description SPUTUM   Final   Special Requests Immunocompromised   Final   Sputum evaluation     Final   Value: THIS SPECIMEN IS ACCEPTABLE. RESPIRATORY CULTURE REPORT TO FOLLOW.   Report Status 11/14/2013 FINAL   Final  CULTURE, RESPIRATORY (NON-EXPECTORATED)     Status: None   Collection Time    11/14/13   4:36 PM      Result Value Ref Range Status   Specimen Description SPUTUM   Final   Special Requests NONE   Final   Gram Stain     Final   Value: RARE WBC PRESENT, PREDOMINANTLY PMN     RARE SQUAMOUS EPITHELIAL CELLS PRESENT     NO ORGANISMS SEEN     Performed at [facility]   Culture     Final   Value: NORMAL OROPHARYNGEAL FLORA     Performed at [facility]   Report Status 11/17/2013 FINAL   Final  HERPES SIMPLEX VIRUS CULTURE     Status: None   Collection Time    11/14/13  6:49 PM      Result Value Ref Range Status   Specimen Description MOUTH   Final   Special Requests Immunocompromised   Final   Culture     Final   Value: No Herpes Simplex Virus detected.     Performed at [facility]   Report Status 11/16/2013 FINAL   Final  VIRAL CULTURE VIRC     Status: None   Collection Time    11/22/13 11:46 AM      Result Value Ref Range Status   Specimen Description ULCER MOUTH   Final   Special Requests Immunocompromised   Final   Culture     Final   Value: CONTINUING TO HOLD     Note:    The current Enterovirus culture system does not detect Enterovirus D68. If Enterovirus D68 is suspected, please call client services to add the test for Enterovirus PCR (Test code [id number]).     Performed at [facility]   Report Status PENDING   Incomplete  CLOSTRIDIUM DIFFICILE BY PCR     Status: None   Collection Time    11/22/13  7:34 PM      Result Value Ref Range Status   C difficile by pcr NEGATIVE  NEGATIVE Final   Comment: Performed at [place], BLOOD (SINGLE)     Status: None   Collection Time    11/22/13 11:30 PM      Result Value Ref Range Status   Specimen Description BLOOD PIC LINE   Final   Special Requests BOTTLES DRAWN AEROBIC AND ANAEROBIC   Final   Culture  Setup Time     Final   Value: 11/20/2013 04:04     Performed at [facility]   Culture     Final   Value: NO GROWTH 5 DAYS     Performed at [facility]   Report Status 11/29/2013 FINAL   Final  AFB CULTURE WITH SMEAR     Status: None   Collection Time    11/21/2013  2:54 PM      Result Value Ref Range Status   Specimen Description BRONCHIAL ALVEOLAR LAVAGE   Final   Special  Requests Immunocompromised   Final   ACID FAST SMEAR     Final   Value: NO ACID FAST BACILLI SEEN     Performed at [facility]   Culture     Final   Value: CULTURE WILL BE EXAMINED FOR 6 WEEKS BEFORE ISSUING A FINAL REPORT     Performed at [facility]   Report Status PENDING   Incomplete  CULTURE, BAL-QUANTITATIVE     Status: None   Collection Time    11/16/2013  2:54 PM      Result Value Ref Range Status   Specimen Description BRONCHIAL ALVEOLAR LAVAGE   Final   Special Requests Immunocompromised   Final   Gram Stain     Final   Value: FEW WBC PRESENT, PREDOMINANTLY PMN     NO SQUAMOUS EPITHELIAL CELLS SEEN     NO ORGANISMS SEEN     Performed at [facility]     Final   Value: NO GROWTH     Performed at [facility]   Culture     Final   Value: NO GROWTH 2 DAYS     Performed at [facility]   Report Status 11/26/2013 FINAL   Final  FUNGUS CULTURE W SMEAR     Status: None   Collection Time    11/08/2013  2:54 PM      Result Value Ref Range Status   Specimen Description BRONCHIAL WASHINGS   Final   Special Requests Immunocompromised   Final   Fungal Smear     Final   Value: NO YEAST OR FUNGAL ELEMENTS SEEN     Performed at [facility]   Culture     Final   Value: CULTURE IN PROGRESS FOR FOUR WEEKS     Performed at [facility]   Report Status PENDING   Incomplete  PNEUMOCYSTIS JIROVECI SMEAR BY DFA     Status: None   Collection Time    11/24/2013  2:54 PM      Result Value Ref Range Status   Specimen Source-[facility]JSRC BRONCHIAL ALVEOLAR LAVAGE   Final   Pneumocystis jiroveci Ag NEGATIVE   Final   Comment: Performed at [facility]  LEGIONELLA CULTURE      Status: None   Collection Time    11/03/2013  2:54 PM      Result Value Ref Range Status   Specimen Description BRONCHIAL WASHINGS   Final   Special Requests Immunocompromised   Final   Culture     Final   Value: NO LEGIONELLA ISOLATED     Performed at [facility]   Report Status 11/29/2013 FINAL   Final  CMV CULTURE CMVC     Status: None   Collection Time    11/30/13 12:24 PM      Result Value Ref Range Status   Specimen Description MOUTH TONGUE ULCER   Final   Special Requests Immunocompromised   Final   Culture     Final   Value: Culture has been initiated.     Performed at [facility]   Report Status PENDING   Incomplete  FUNGUS CULTURE W SMEAR     Status: None   Collection Time    11/30/13 12:25 PM      Result Value Ref Range Status   Specimen Description ORAL TONGUE ULCER   Final   Special Requests Immunocompromised   Final  Fungal Smear     Final   Value: NO YEAST OR FUNGAL ELEMENTS SEEN     Performed at [facility]   Culture     Final   Value: Culture in Progress for 7 days     Performed at [facility]   Report Status PENDING   Incomplete  WOUND CULTURE     Status: None   Collection Time    11/30/13 12:25 PM      Result Value Ref Range Status   Specimen Description ORAL TONGUE ULCER   Final   Special Requests Immunocompromised   Final   Gram Stain     Final   Value: NO WBC SEEN     RARE SQUAMOUS EPITHELIAL CELLS PRESENT     MODERATE GRAM POSITIVE RODS     Performed at [facility]   Culture     Final   Value: MULTIPLE ORGANISMS PRESENT, NONE PREDOMINANT     Note: NO STAPHYLOCOCCUS AUREUS ISOLATED NO GROUP A STREP (S.PYOGENES) ISOLATED     Performed at [facility]   Report Status 12/02/2013 FINAL   Final  CULTURE, BLOOD (ROUTINE X 2)     Status: None   Collection Time    12/01/13  8:12 AM      Result Value Ref Range Status   Specimen Description BLOOD LEFT HAND   Final   Special Requests BOTTLES DRAWN  AEROBIC AND ANAEROBIC 6 MLS EACH   Final   Culture  Setup Time     Final   Value: 12/01/2013 10:37     Performed at [facility]   Culture     Final   Value:        BLOOD CULTURE RECEIVED NO GROWTH TO DATE CULTURE WILL BE HELD FOR 5 DAYS BEFORE ISSUING A FINAL NEGATIVE REPORT     Performed at [facility]   Report Status PENDING   Incomplete  CULTURE, BLOOD (ROUTINE X 2)     Status: None   Collection Time    12/01/13  8:15 AM      Result Value Ref Range Status   Specimen Description BLOOD LEFT HAND   Final   Special Requests BOTTLES DRAWN AEROBIC AND ANAEROBIC 3 MLS EACH   Final   Culture  Setup Time     Final   Value: 12/01/2013 10:37     Performed at [facility]   Culture     Final   Value:        BLOOD CULTURE RECEIVED NO GROWTH TO DATE CULTURE WILL BE HELD FOR 5 DAYS BEFORE ISSUING A FINAL NEGATIVE REPORT     Performed at [facility]   Report Status PENDING   Incomplete  URINE CULTURE     Status: None   Collection Time    12/01/13 11:40 AM      Result Value Ref Range Status   Specimen Description URINE, CATHETERIZED   Final   Special Requests NONE   Final   Culture  Setup Time     Final   Value: 12/01/2013 14:31     Performed at [facility]   [place] Count     Final   Value: NO GROWTH     Performed at [facility]   Culture     Final   Value: NO GROWTH     Performed at [facility]   Report Status 12/02/2013 FINAL   Final      Medical History: Past Medical History  Diagnosis Date  . Cancer   . Cough productive of clear sputum 01/04/2013  . Fibromyalgia     Medications:  Prescriptions prior to admission  Medication Sig Dispense Refill  . cholecalciferol (VITAMIN D) 1000 UNITS tablet Take 1,000 Units by mouth daily.      . DULoxetine (CYMBALTA) 60 MG capsule Take 60 mg by mouth every evening.      . gabapentin (NEURONTIN) 300 MG capsule Take 600 mg by mouth 2 (two) times daily.       Nyoka Cowden Tea 250 MG CAPS  Take by mouth every morning.      Marland Kitchen amoxicillin (AMOXIL) 500 MG capsule Take 500 mg by mouth 3 (three) times daily. For 7 days.       Assessment: 19 YOM with progressive HCAP. He was on Vancomycin/Cefepime/Fluconazole and is now being changed to Zyvox/Zosyn/Micafungin. Pt has documented allergy to penicillin. Will f/u with MD to consider leaving Cefepime on-board or change to a carbapenem. He is currently septic with a WBC of 12.5, max temp of 101.3 but is currently afebrile. CrCl ~ 39 mL/min.   3/6 Blood Cx x2 >>ngtd   Goal of Therapy:  Eradication of infection  Plan:  1) Zyvox 600 mg IV Q 12 hours  2) Micafungin 100 mg IV Q 24 hours  3) Monitor CBC, cultures, renal fx and patient's clinical progress  4) F/u changing zosyn to another antibiotic.   Albertina Parr, PharmD.  Clinical Pharmacist Pager [phone]

## 2013-12-02 NOTE — Progress Notes (Signed)
Attempted to do ABG as ordered, both extremities are labeled as restricted, RN notified, awaiting further orders.

## 2013-12-02 NOTE — Progress Notes (Signed)
She is beginning to decline again. More short of breath. Clearly more congested. Her hemoglobin 6.1. We will need to transfuse her. We'll check a chest x-ray on her.  She had a CT scan done yesterday. Now it's either may be a pneumonia in the left upper lung.  She did have a tongue ulcer a biopsy. This went fairly well. We will hold we have some results on Monday.  Her PICC line was exchanged. Will see if the cultures are positive. She's now back on Maxipime and vancomycin. She's also on Diflucan and acyclovir.  This is incredibly frustrating. She continues to have these episodes of decompensation. I do not know if they are episodes of sepsis. We will check a pro-calcitonin. I will also check a pro-BNP.  She's had a very poor appetite. She is on Marinol. I really cannot put on Megace because of the risk of thromboembolic disease.  I fear that she may end up back in the ICU this weekend.  Her husband was with her today. I tried to give an update as to what was going on.  Her blood pressure is holding on to well. It is 1 4475. She is afebrile this morning.  Her platelet count went to -60,000. She responds well to managed platelets. Her creatinine is hold steady at 1.54. White cell count actually continues to come down.  On her oral exam, the ulcer looks about the same. Her lungs show a lot of crackles and wheezes bilaterally. Cardiac exam tachycardic but regular. Abdomen soft. Slightly distended. Good bowel sounds. Not much tenderness in the left lower quadrant. Extremities shows 2+ edema in her legs. Skin exam no rashes.  I hope that she does not have pneumonia now. One might wonder if there is any type of aspiration. She is on broad-spectrum coverage now.  Again, we will transfuse her. We will give her nebulizers.  I am just not sure how it more she is going to be oh to tolerate.  I talked about this with she and her husband this morning. She still wants to try and get better. As such, we  will continue to be aggressive.  [name]  Psalms 44:4

## 2013-12-02 NOTE — Progress Notes (Signed)
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 She was admitted to [facility] on 11/20/2013 and has had a prolonged course involving several intensive care unit stays for putative pneumonia. Pulmonary critical care medicine was asked to assume care of the patient on the evening of 12/02/2013 for worsening hypoxic respiratory failure and hypotension.  SIGNIFICANT EVENTS / STUDIES:  1. Upper extremity  Dopplers dated 11/12/2013 demonstrated acute clot of the right cephalic vein 2. Chest CT 11/14/2013 demonstrated loculated right pleural effusion which does not appear to have intact 3. Echocardiogram dated 11/14/2013 demonstrates EF of 40-98% with diastolic dysfunction. 4. Abdominal CT dated 11/21/2013 demonstrated hemorrhage in the left iliacus muscle 5. Chest CT dated 11/20/2013 demonstrated persistent right pleural effusion and right middle lobe density 6. Head CT 11/22/2013 demonstrated sphenoid sinusitis 7. Bronchoscopy dated 11/19/2013 did not reveal an infectious organism 8. Lower extremity Dopplers 11/28/2013 did not detect DVTs 9. Chest CT dated 12/01/2013 demonstrated persistent collapse of the right middle and right lower lobes with a large pleural effusion. There was development of a new left airspace opacity. 10. Tongue biopsy 12/11/2013 for persistent ulcer   LINES / TUBES: 1. Left-sided PICC 12/01/2013 2. Foley 11/27/2013  CULTURES: 1. Blood culture x 2 12/01/2013 no growth to date 2. Urine culture 12/01/2013 no growth to date 3. Tongue ulcer culture 12/10/2013 with  moderate gram-positive rods and  no evidence of yeast  ANTIBIOTICS: 1. Acyclovir  2. Cefepime 12/01/2013- 3. Fluconazole 4. Vancomycin 5. Previous Courses of Imipenem, and vancomycin  HISTORY OF PRESENT ILLNESS:  Lacey [name] is a 58 year old female with multiple medical problems in the highlights of which include secondary myelodysplastic syndrome following treatment of a subsequent bone marrow transplant for Hodgkin's lymphoma who presented to [facility] on 11/25/2013 for nausea and vomiting. She's had a prolonged hospital course since with several trips to the intensive care unit for septic shock is presumably secondary to pneumonia, multiple deep venous thromboses with bleeding on anticoagulation, and persistent respiratory failure. Unfortunately, she is currently altered and unable to provide much history. History is obtained from chart review and from discussion with the patient's family including her husband who were at bedside.  PAST MEDICAL HISTORY :  Past Medical History  Diagnosis Date  . Cancer   . Cough productive of clear sputum 01/04/2013  . Fibromyalgia     Past Surgical History  Procedure Laterality Date  . Bone marrow biopsy  05/20110  . Spine surgery  1998    c-4 c 5 fusion  . Hodgkin      Prior to Admission medications   Medication Sig Start Date End Date Taking? Authorizing Provider  cholecalciferol (VITAMIN D) 1000 UNITS tablet Take 1,000 Units by mouth daily.   Yes Historical Provider, MD  DULoxetine (CYMBALTA) 60 MG capsule Take 60 mg by mouth every evening.   Yes Historical Provider, MD  gabapentin (NEURONTIN) 300 MG capsule Take 600 mg by mouth 2 (two) times daily.    Yes Historical Provider, MD  Nyoka Cowden Tea 250 MG CAPS Take by mouth every morning.   Yes Historical Provider, MD  amoxicillin (AMOXIL) 500  MG capsule Take 500 mg by mouth 3 (three) times daily. For 7 days. 10/26/13   Historical Provider, MD    Allergies  Allergen Reactions  .  Penicillins Hives and Rash  . Contrast Media [Iodinated Diagnostic Agents]     FAMILY HISTORY:  Family History  Problem Relation Age of Onset  . Cancer Mother   . Cancer Father     SOCIAL HISTORY:  reports that she has never smoked. She has never used smokeless tobacco. She reports that she drinks alcohol. She reports that she does not use illicit drugs.  REVIEW OF SYSTEMS:  Unable to obtain secondary to patient condition.  PHYSICAL EXAM  VITAL SIGNS: Temp:  [98.1 F (36.7 C)-101.3 F (38.5 C)] 98.5 F (36.9 C) (03/07 1649) Pulse Rate:  [100-132] 132 (03/07 1900) Resp:  [20-29] 29 (03/07 1900) BP: (89-144)/(48-108) 97/48 mmHg (03/07 1900) SpO2:  [92 %-100 %] 92 % (03/07 1900) Weight:  [199 lb 8.3 oz (90.5 kg)-207 lb 3.7 oz (94 kg)] 199 lb 8.3 oz (90.5 kg) (03/07 1800)  HEMODYNAMICS:    VENTILATOR SETTINGS:    INTAKE / OUTPUT: Intake/Output     03/07 0701 - 03/08 0700   Blood    Total Intake(mL/kg)    Urine (mL/kg/hr) 3875 (3.1)   Total Output 3875   Net -3875        (+) 8.8 L since admission  PHYSICAL EXAMINATION: General:  Elderly appearing female in no acute distress  Neuro:  Somnolent but arouses to commands. Answers short questions. HEENT:  Sclera anicteric, conjunctiva pale, mucous membranes moist, oropharynx clear. Neck:  Trachea supple and midline, no lymphadenopathy or JVD.  Cardiovascular:  Tachycardic, regular rate and rhythm, normal S1-S2, no murmurs rubs or gallops.  Lungs:  Diffuse wheezing heard throughout all lung fields. Decreased lung sounds at the right base. Abdomen:  Soft, mildly distended, positive bowel sounds, nontender.  Musculoskeletal:  3+ pitting edema diffusely particularly notable in the right upper extremity. Skin:  Grossly intact.  LABS:  CBC Recent Labs     11/30/13  0530  12/01/13  0512  12/02/13  0445  WBC  18.0*  17.2*  12.5*  HGB  7.9*  7.2*  6.1*  HCT  24.9*  22.7*  19.4*  PLT  21*  16*  60*    Coag's No  results found for this basename: APTT, INR,  in the last 72 hours  BMET Recent Labs     11/30/13  0530  12/01/13  0512  12/02/13  0445  NA  139  139  139  K  4.0  4.1  4.3  CL  99  98  101  CO2  _0 BUN  _1 CREATININE  1.19*  1.56*  1.54*  GLUCOSE  101*  96  94    Electrolytes Recent Labs     11/30/13  0530  12/01/13  0512  12/02/13  0445  CALCIUM  8.7  8.5  8.6    Sepsis Markers No results found for this basename: LACTICACIDVEN, PROCALCITON, O2SATVEN,  in the last 72 hours  ABG No results found for this basename: PHART, PCO2ART, PO2ART,  in the last 72 hours  Liver Enzymes Recent Labs     11/30/13  0530  AST  14  ALT  18  ALKPHOS  69  BILITOT  1.0  ALBUMIN  2.2*    Cardiac Enzymes No results found for this basename: TROPONINI,  PROBNP,  in the last 72 hours  Glucose No results found for this basename: GLUCAP,  in the last 72 hours  Imaging Dg Chest 1 View  12/02/2013   CLINICAL DATA:  Worsening dyspnea  EXAM: CHEST - 1 VIEW  COMPARISON:  DG CHEST 1V PORT dated 12/02/2013  FINDINGS: Since the earlier study the pulmonary interstitial markings have increased slightly especially on the right. There remains confluent parenchymal density in the right perihilar region and at the right lung base. The right pleural effusion is known to be present and appears stable. The right heart border is obscured. The left heart border is stable. Mild prominence of the central pulmonary vascularity is present and stable.  IMPRESSION: The pulmonary interstitial markings within the right lung appear slightly more conspicuous than on the earlier study. The left lung has not significantly changed in appearance.   Electronically Signed   By: David  Martinique   On: 12/02/2013 18:39   Ct Chest Wo Contrast  12/01/2013   CLINICAL DATA:  Fever and cough  EXAM: CT CHEST WITHOUT CONTRAST  TECHNIQUE: Multidetector CT imaging of the chest was performed following the standard protocol  without IV contrast.  COMPARISON:  11/20/2013  FINDINGS: Left PICC line tip is in the mid right atrium.  No axillary lymphadenopathy. Scattered small lymph nodes in the upper mediastinum are unchanged. Heart size is stable. No pericardial or left pleural effusion.  The moderate right pleural effusion has increased slightly in size in the interval. The right upper lobe collapse/ consolidation persist. There has been interval development of central airspace disease in the left upper lobe tracking back to the major fissure. There is posterior left lower lobe collapse/consolidation.  Well-defined hypo attenuating lesions in the liver are again noted. One of the larger lesions is in the posterior medial right liver, measuring 2.5 cm and averaging air water density. The liver lesions are likely cysts. There is thickening/enlargement of both adrenal glands, as before although neither gland has been completely visualized.  Bone windows reveal no worrisome lytic or sclerotic osseous lesions.  IMPRESSION: Persistent right upper lobe collapse/ consolidation with interval development of central airspace disease in the left upper lobe. Imaging features would be compatible with pneumonia although lymphoma involvement is not completely excluded by imaging.  Multiple hepatic cysts.  Stable appearance of bilateral adrenal enlargement.   Electronically Signed   By: Misty Stanley M.D.   On: 12/01/2013 12:04   Dg Chest Port 1 View  12/02/2013   CLINICAL DATA:  Worsening shortness of breath and productive cough  EXAM: PORTABLE CHEST - 1 VIEW  COMPARISON:  12/01/2013.  FINDINGS: There is persistent, moderate right pleural effusion which appears unchanged from previous exam. Right upper lobe airspace consolidation and atelectasis is unchanged from previous exam. Persistent patchy airspace disease within the left upper lobe and left lower lobe is also unchanged.  IMPRESSION: 1. No change and right pleural effusion. 2. Bilateral areas of  consolidation are also unchanged.   Electronically Signed   By: Kerby Moors M.D.   On: 12/02/2013 07:50    EKG: None since 2/17  ASSESSMENT / PLAN: Active Problems:   Acute-on-chronic respiratory failure   Hypotension, unspecified   Severe sepsis   DVT (deep venous thrombosis)   Symptomatic anemia   Adrenal infarction   Acute on chronic diastolic heart failure   Altered mental status   Pleural effusion   Acute renal failure   Thrombocytopenia   Myelodysplasia   PULMONARY A/P: 1.  Acute hypoxic respiratory failure: This is most likely secondary to progressive pneumonia and volume overload as evidenced by the recent CT. Unfortunately, pulmonary embolism is in the differential as well. Perhaps even more unfortunately, should it be secondary to pulmonary embolism her therapeutic options are limited. The patient has early LAD while on anticoagulation and is significantly thrombocytopenic. Currently, she appears to be protecting her airway, however, she is at high-risk for decompensation requiring mechanical ventilation. 2. Pleural effusion: To the best that I can determine this has never been tapped. Is unlikely to be an empyema, however, that possibility remains. Given the patient's coagulopathy it will be difficult to arrange for a tap.  Supplemental oxygen to maintain oxygen saturations greater than or equal to 92%  Check ABG via left arterial line  Discuss IVC filter placement with VIR in the morning (VIR has yet to be contacted)  Diuresis as tolerated  Plan for thoracentesis tomorrow following platelet administration  Check coags tonight  CARDIOVASCULAR A/P:  1. Severe sepsis: Presumably, this is secondary to progressive or repeats pneumonia of the left lung. Thus far, the only definitive evidence for endorgan dysfunction is altered mental status and hypotension. There is no evidence for shock as of yet. The patient's picture is complicated by the presence of putative  adrenal insufficiency. Given her severe degree of volume overload there is no role for additional fluid resuscitation. 2. Hypotension: 3. Acute heart failure  Serial lactates  Repeat EKG  Serial Troponins  Neosynephrine to maintain MAPS of greater than or equal to 65  Repeat Echocardiogram  Continue Stress Dose Steroids  Check CVP and SvO2  RENAL A/P:  1. Acute Renal Failure: This is most likely secondary to severe heart failure rather than hypotension.  Check urine electrolytes  Patient to get abdominal CT today secondary to recent abdominal pain complaints; will rule out hydronephrosis with that  Serial BMPs  GASTROINTESTINAL A/P:  1. Abdominal Pain:   CT  HEMATOLOGIC A/P:   1. Acute DVTs: Ms. Wipperfurth has recently had lower extremity Dopplers and thus I don't see a reason to repeat them today. She has early demonstrated that she is unable to tolerate anticoagulation. A vena caval filter seems reasonable. 2. Transfusion Dependent anemia and thrombocytopenia: 3. Myelodysplasia:  Discuss with VIR in the morning (this needs to be done)  INFECTIOUS A/P: 1. HCAP: Presumably, this is the source of the patient's current episode of sepsis.  Change antibiotics to Linzeolid, Zosyn, and micafungin  Obtain sputum specimen  Obtain ID consult in the morning (this needs to be called)  ENDOCRINE A/P: 1. Adrenal Insufificency:   Continue stress dose steroids  NEUROLOGIC A/P: 1. Altered Mental Status: This may be secondary to the acute sepsis, narcotic overdose (in particular suspicious of this given the patient's family's report of worsening metal status following narcotic administration).  Head CT  Minimize narcotics  Use Tylenol IV for pain relief for now  BEST PRACTICE / DISPOSITION Level of Care:  ICU Consultants:  Oncology Code Status:  Full Diet:  NPO  DVT Px:  SCDs GI Px:  PPI Skin Integrity:  Intact Social / Family:  Updated at Bedside  TODAY'S  SUMMARY:   I have personally obtained a history, examined the patient, evaluated laboratory and imaging results, formulated the assessment and plan and placed orders.  CRITICAL CARE: The patient is critically ill with multiple organ systems failure and requires high complexity decision making for assessment and support, frequent evaluation and titration of therapies, application of advanced  monitoring technologies and extensive interpretation of multiple databases. Critical Care Time devoted to patient care services described in this note is 120 minutes.   Margarette Asal, MD Pulmonary and [place] Pager: [phone]  12/02/2013, 8:51 PM

## 2013-12-02 NOTE — Progress Notes (Signed)
[facility] PROGRESS NOTE  Lacey [name] BJY:[id number] DOB: 1956-03-05 DOA: 11/12/2013 PCP: Vena Austria, MD  Brief summary  58 y.o. 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 -ID was consulted and PCCM followed. She underwent bronchoscopy, however, all cultures were negative. She will have completed an additional 7 days of antibiotics on 3/3.  -Additionally, Dr. Marin Olp is determining what treatment to initiate for her MDS as she has required frequent transfusions.   Assessment/Plan  1. 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 Transforming leukemia  -Refractory anemia and thrombocytopenia requiring almost daily blood product transfusions, Bone Marrow Biopsy: HYPERCELLULAR BONE MARROW FOR AGE WITH DYSPOIETIC CHANGES. -myelodysplastic/myeloproliferative process, particularly chronic myelomonocytic leukemia - Appreciate Dr Jonette Eva assistance. on frequent daily transfusions as patient is developing antibodies. ? Need palliative care plan. Defer to Oncology.   2. Recurrent fever/septic shock, pneumonia Likely due dysfunction leukocytes as above  -CT chest on 2/23: Persistent consolidation RML with new peribronchovascular opacities suggestive of bronchopneumonia. Underwent bronch on 2/26; BCx NGTD,  Diff PCR neg, BAL: PCP neg, Bacterial, fungal, and AFB cultures NGTD  - Pt was treated with vanc and cefepime through 3/3  - 3/6: febrile; CT: Persistent right upper lobe collapse/ consolidation with interval development of central airspace disease in the left upper lobe  - 3/6: restarted IV atx  (vanc/cefepime), antifungals, acyclovir for tongue ulcerrepeat blood c/s;  d/c PICC; pend repeat blood cultures  -TF to SDU, NiPPV as need;is fails may need mechanical vent  3. Acute on chronic diastolic heart failure due to IVF from resuscitation. Echo (2/15): Preserved EF and grade 1 diastolic heart failure -R sided pleural effusion; anasarca, Pt was lasix developed AKI, Daily weights and strict I/O;  -resume lasix close monitor renal function; IR to evaluate possible thoracentesis to relieve SOB   4. 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 Acute Renal Failure due to septic shock; RUS wnl. Hypervolemic  - resume lasix due to respiratory status; close monitor  6. Spontaneous retroperitoneal bleed in setting of thrombocytopenia; hemorrhage into her left iliopsoas muscle which caused weakness and pain of her left leg and LLQ - Started fentanyl patch on 3/2; cont pain control  7. Right upper extremity superficial thrombosis, stable  - Not on ac due to thrombocytopenia  8. Transaminitis due to septic shock, resolved.  9. Urge and stress incontinence; Foley placed for comfort  10. Tongue ulcer; cont as above; biopsied by ENT   TF to SDU  Proph: Hold for thrombocytopenia, spont retroperitoneal bleed  Code Status: full  Family Communication: patient alone, husband at the bedside yesterday  Disposition Plan: Clinically improving. PT/OT recommending CIR vs. SNF. Need plan for marrow failure, however, because she is requiring frequent blood and plt transfusions.  Consultants:  Dr. Burney Gauze (oncology)  Dr Gibson Ramp (Surgery) curbside consult  Dr. Carlyle Basques (infectious disease)  Procedures:  11/12/2013 right upper extremity Doppler  No evidence of deep vein thrombosis involving the right upper extremity, left subclavian vein, and left internal jugular vein. There is superficial thrombosis noted in the right cephalic  vein.  -0/86 Bone marrow biopsy report pending  Echocardiogram 01/10/2013  - Left  ventricle: mild focal basal hypertrophy of the septum.  -LVEF= ejection fraction was 55%. Wall motion was normal; there were no regional wall motion abnormalities.  -(grade 1 diastolic dysfunction).  - Aortic valve: There was no stenosis. Trivial regurgitation. - Mitral valve: Trivial regurgitation. - Left atrium: The atrium was mildly dilated. - Right ventricle: The cavity size was normal. Systolic function was normal. - Tricuspid valve: Peak RV-RA gradient: 68mm Hg (S). - Pulmonary arteries: PA peak pressure: 36mm Hg (S). - Inferior vena cava: The vessel was normal in size; the respirophasic diameter changes were in the normal range (=50%); findings are consistent with normal central venous pressure.  Antibiotics:  Aztreonam 2/11>> stopped 2/13  Ciprofloxacin 2/11>> stopped 2/15  Vancomycin 2/11>> stopped 2/15  Primaxin>> 2/16>> 2/21  Voriconazole 2/16>> stopped 2/18, restarted 2/26 <<<3/3 Vancomycin 2/17>> 2/24, restarted 2/26 >>>3/3  Vanc3/6<<<< cefepime 3/6<<< fluconazole 3/5<<<< Acyclovir 3/5<<<<    Consultants:  Oncology  HPI/Subjective: alert  Objective: Filed Vitals:   12/02/13 0659  BP: 144/75  Pulse: 113  Temp: 98.7 F (37.1 C)  Resp: 22    Intake/Output Summary (Last 24 hours) at 12/02/13 0958 Last data filed at 12/02/13 6010  Gross per 24 hour  Intake    500 ml  Output   2075 ml  Net  -1575 ml   Filed Weights   11/29/13 0616 12/01/13 0806 12/02/13 0659  Weight: 103.2 kg (227 lb 8.2 oz) 92.2 kg (203 lb 4.2 oz) 94 kg (207 lb 3.7 oz)    Exam:   General:  alert  Cardiovascular: s1,s2 rrr  Respiratory: few LL crackles   Abdomen: soft, nt, nd   Musculoskeletal: LE edema   Data Reviewed: Basic Metabolic Panel:  Recent Labs Lab 11/28/13 0510 11/29/13 0515 11/30/13 0530 12/01/13 0512 12/02/13 0445  NA 145 142 139 139 139  K 3.2* 3.0* 4.0 4.1 4.3  CL  104 101 99 98 101  CO2 $Re'31 31 29 29 29  'HbY$ GLUCOSE 109* 145* 101* 96 94  BUN 24* $Remov'20 17 19 17  'MEWFiV$ CREATININE 0.92 1.03 1.19* 1.56* 1.54*  CALCIUM 8.4 8.3* 8.7 8.5 8.6   Liver Function Tests:  Recent Labs Lab 11/30/13 0530  AST 14  ALT 18  ALKPHOS 69  BILITOT 1.0  PROT 6.4  ALBUMIN 2.2*   No results found for this basename: LIPASE, AMYLASE,  in the last 168 hours No results found for this basename: AMMONIA,  in the last 168 hours CBC:  Recent Labs Lab 11/28/13 0510 11/29/13 0515 11/30/13 0530 12/01/13 0512 12/02/13 0445  WBC 17.6* 16.4* 18.0* 17.2* 12.5*  NEUTROABS  --   --   --  13.1*  --   HGB 8.7* 7.9* 7.9* 7.2* 6.1*  HCT 26.9* 24.9* 24.9* 22.7* 19.4*  MCV 88.2 89.6 89.9 90.1 90.2  PLT 62* 25* 21* 16* 60*   Cardiac Enzymes: No results found for this basename: CKTOTAL, CKMB, CKMBINDEX, TROPONINI,  in the last 168 hours BNP (last 3 results)  Recent Labs  11/14/13 0435 11/17/13 0800 11/20/13 0515  PROBNP 2977.0* 9541.0* 3178.0*   CBG: No results found for this basename: GLUCAP,  in the last 168 hours  Recent Results (from the past 240 hour(s))  VIRAL CULTURE VIRC     Status: None   Collection Time    11/22/13 11:46 AM      Result Value Ref Range Status   Specimen Description ULCER MOUTH   Final   Special Requests Immunocompromised   Final  Culture     Final   Value: CONTINUING TO HOLD     Note:    The current Enterovirus culture system does not detect Enterovirus D68. If Enterovirus D68 is suspected, please call client services to add the test for Enterovirus PCR (Test code [id number]).     Performed at [facility]   Report Status PENDING   Incomplete  CLOSTRIDIUM DIFFICILE BY PCR     Status: None   Collection Time    11/22/13  7:34 PM      Result Value Ref Range Status   C difficile by pcr NEGATIVE  NEGATIVE Final   Comment: Performed at [facility], BLOOD (SINGLE)     Status: None   Collection Time    11/22/13 11:30 PM      Result  Value Ref Range Status   Specimen Description BLOOD PIC LINE   Final   Special Requests BOTTLES DRAWN AEROBIC AND ANAEROBIC   Final   Culture  Setup Time     Final   Value: 11/15/2013 04:04     Performed at [facility]   Culture     Final   Value: NO GROWTH 5 DAYS     Performed at [facility]   Report Status 11/29/2013 FINAL   Final  AFB CULTURE WITH SMEAR     Status: None   Collection Time    11/11/2013  2:54 PM      Result Value Ref Range Status   Specimen Description BRONCHIAL ALVEOLAR LAVAGE   Final   Special Requests Immunocompromised   Final   ACID FAST SMEAR     Final   Value: NO ACID FAST BACILLI SEEN     Performed at [facility]   Culture     Final   Value: CULTURE WILL BE EXAMINED FOR 6 WEEKS BEFORE ISSUING A FINAL REPORT     Performed at [facility]   Report Status PENDING   Incomplete  CULTURE, BAL-QUANTITATIVE     Status: None   Collection Time    11/05/2013  2:54 PM      Result Value Ref Range Status   Specimen Description BRONCHIAL ALVEOLAR LAVAGE   Final   Special Requests Immunocompromised   Final   Gram Stain     Final   Value: FEW WBC PRESENT, PREDOMINANTLY PMN     NO SQUAMOUS EPITHELIAL CELLS SEEN     NO ORGANISMS SEEN     Performed at [facility]     Final   Value: NO GROWTH     Performed at [facility]   Culture     Final   Value: NO GROWTH 2 DAYS     Performed at [facility]   Report Status 11/26/2013 FINAL   Final  FUNGUS CULTURE W SMEAR     Status: None   Collection Time    11/13/2013  2:54 PM      Result Value Ref Range Status   Specimen Description BRONCHIAL WASHINGS   Final   Special Requests Immunocompromised   Final   Fungal Smear     Final   Value: NO YEAST OR FUNGAL ELEMENTS SEEN     Performed at [facility]   Culture     Final   Value: CULTURE IN PROGRESS FOR FOUR WEEKS     Performed at [facility]   Report Status PENDING   Incomplete  PNEUMOCYSTIS JIROVECI SMEAR BY DFA     Status: None   Collection Time    11/10/2013  2:54 PM      Result Value Ref Range Status   Specimen Source-PJSRC BRONCHIAL ALVEOLAR LAVAGE   Final   Pneumocystis jiroveci Ag NEGATIVE   Final   Comment: Performed at [facility]  LEGIONELLA CULTURE     Status: None   Collection Time    11/15/2013  2:54 PM      Result Value Ref Range Status   Specimen Description BRONCHIAL WASHINGS   Final   Special Requests Immunocompromised   Final   Culture     Final   Value: NO LEGIONELLA ISOLATED     Performed at [facility]   Report Status 11/29/2013 FINAL   Final  CMV CULTURE CMVC     Status: None   Collection Time    11/30/13 12:24 PM      Result Value Ref Range Status   Specimen Description MOUTH TONGUE ULCER   Final   Special Requests Immunocompromised   Final   Culture     Final   Value: Culture has been initiated.     Performed at [facility]   Report Status PENDING   Incomplete  FUNGUS CULTURE W SMEAR     Status: None   Collection Time    11/30/13 12:25 PM      Result Value Ref Range Status   Specimen Description ORAL TONGUE ULCER   Final   Special Requests Immunocompromised   Final   Fungal Smear     Final   Value: NO YEAST OR FUNGAL ELEMENTS SEEN     Performed at [facility]   Culture     Final   Value: Culture in Progress for 7 days     Performed at [facility]   Report Status PENDING   Incomplete  WOUND CULTURE     Status: None   Collection Time    11/30/13 12:25 PM      Result Value Ref Range Status   Specimen Description ORAL TONGUE ULCER   Final   Special Requests Immunocompromised   Final   Gram Stain     Final   Value: NO WBC SEEN     RARE SQUAMOUS EPITHELIAL CELLS PRESENT     MODERATE GRAM POSITIVE RODS     Performed at [facility]   Culture     Final   Value: MULTIPLE ORGANISMS PRESENT, NONE PREDOMINANT     Note: NO STAPHYLOCOCCUS AUREUS ISOLATED NO GROUP A STREP  (S.PYOGENES) ISOLATED     Performed at [facility]   Report Status 12/02/2013 FINAL   Final  URINE CULTURE     Status: None   Collection Time    12/01/13 11:40 AM      Result Value Ref Range Status   Specimen Description URINE, CATHETERIZED   Final   Special Requests NONE   Final   Culture  Setup Time     Final   Value: 12/01/2013 14:31     Performed at [place]     Final   Value: NO GROWTH     Performed at [facility]   Culture     Final   Value: NO GROWTH     Performed at [facility]   Report Status 12/02/2013 FINAL   Final     Studies: Ct  Chest Wo Contrast  12/01/2013   CLINICAL DATA:  Fever and cough  EXAM: CT CHEST WITHOUT CONTRAST  TECHNIQUE: Multidetector CT imaging of the chest was performed following the standard protocol without IV contrast.  COMPARISON:  11/20/2013  FINDINGS: Left PICC line tip is in the mid right atrium.  No axillary lymphadenopathy. Scattered small lymph nodes in the upper mediastinum are unchanged. Heart size is stable. No pericardial or left pleural effusion.  The moderate right pleural effusion has increased slightly in size in the interval. The right upper lobe collapse/ consolidation persist. There has been interval development of central airspace disease in the left upper lobe tracking back to the major fissure. There is posterior left lower lobe collapse/consolidation.  Well-defined hypo attenuating lesions in the liver are again noted. One of the larger lesions is in the posterior medial right liver, measuring 2.5 cm and averaging air water density. The liver lesions are likely cysts. There is thickening/enlargement of both adrenal glands, as before although neither gland has been completely visualized.  Bone windows reveal no worrisome lytic or sclerotic osseous lesions.  IMPRESSION: Persistent right upper lobe collapse/ consolidation with interval development of central airspace disease in the left  upper lobe. Imaging features would be compatible with pneumonia although lymphoma involvement is not completely excluded by imaging.  Multiple hepatic cysts.  Stable appearance of bilateral adrenal enlargement.   Electronically Signed   By: Misty Stanley M.D.   On: 12/01/2013 12:04   Dg Chest Port 1 View  12/02/2013   CLINICAL DATA:  Worsening shortness of breath and productive cough  EXAM: PORTABLE CHEST - 1 VIEW  COMPARISON:  12/01/2013.  FINDINGS: There is persistent, moderate right pleural effusion which appears unchanged from previous exam. Right upper lobe airspace consolidation and atelectasis is unchanged from previous exam. Persistent patchy airspace disease within the left upper lobe and left lower lobe is also unchanged.  IMPRESSION: 1. No change and right pleural effusion. 2. Bilateral areas of consolidation are also unchanged.   Electronically Signed   By: Kerby Moors M.D.   On: 12/02/2013 07:50    Scheduled Meds: . acyclovir  5 mg/kg (Ideal) Intravenous Q8H  . antiseptic oral rinse  15 mL Mouth Rinse q12n4p  . ceFEPime (MAXIPIME) IV  1 g Intravenous Q12H  . chlorhexidine  15 mL Mouth Rinse BID  . dronabinol  5 mg Oral BID AC  . DULoxetine  60 mg Oral QPM  . feeding supplement (ENSURE COMPLETE)  237 mL Oral TID WC  . fentaNYL  12.5 mcg Transdermal Q72H  . fluconazole (DIFLUCAN) IV  200 mg Intravenous Q24H  . fluticasone  2 spray Each Nare Daily  . furosemide  40 mg Intravenous Once  . metoprolol tartrate  12.5 mg Oral BID  . pantoprazole  40 mg Oral Daily  . potassium chloride  40 mEq Oral BID  . predniSONE  5 mg Oral Q breakfast  . sodium chloride  10-40 mL Intracatheter Q12H  . sodium chloride  3 mL Intravenous Q12H  . vancomycin  1,250 mg Intravenous Q24H   Continuous Infusions:   Principal Problem:   Septic shock Active Problems:   Hodgkin lymphoma   Myelodysplasia   Adrenal abnormality   Rash and nonspecific skin eruption   DOE (dyspnea on exertion)    Symptomatic anemia   Melena   Adrenal infarction   Hypotension, unspecified   PNA (pneumonia)   Sepsis   Acute renal failure   Pulmonary infiltrate  Fever   Acute on chronic diastolic heart failure    Time spent: >35 minutes     Kinnie Feil  [facility] Pager [phone]. If 7PM-7AM, please contact night-coverage at [email or web address], password [facility] 12/02/2013, 9:58 AM  LOS: 25 days

## 2013-12-02 NOTE — Progress Notes (Signed)
[facility] PROGRESS NOTE  ANTONIETA SLAVEN PFY:[id number] DOB: 1956-06-29 DOA: 11/17/2013 PCP: Vena Austria, MD  Unfortunately patient progressively declining, tachycardic, tachypnic with labile BP;  -consulted critical care support; cont close monitor;' likely need mechanical ventilation    Kinnie Feil  [facility] Pager [phone]. If 7PM-7AM, please contact night-coverage at [email or web address], password [facility] 12/02/2013, 6:02 PM  LOS: 25 days

## 2013-12-02 NOTE — Procedures (Signed)
Arterial Catheter Insertion Procedure Note Lacey [name] [id number] October 16, 1955  Procedure: Insertion of Arterial Catheter  Indications: Blood pressure monitoring  Procedure Details Consent: Risks of procedure as well as the alternatives and risks of each were explained to the (patient/caregiver).  Consent for procedure obtained. Time Out: Verified patient identification, verified procedure, site/side was marked, verified correct patient position, special equipment/implants available, medications/allergies/relevent history reviewed, required imaging and test results available.  Performed  Maximum sterile technique was used including antiseptics, cap, gloves, gown, hand hygiene, mask and sheet. Skin prep: Chlorhexidine; local anesthetic administered 20 gauge catheter was inserted into right radial artery using the Seldinger technique.  Evaluation Blood flow good; BP tracing good. Complications: No apparent complications.   Rosann Auerbach 12/02/2013

## 2013-12-02 NOTE — Progress Notes (Signed)
Per attending, M. Lynch, platelets held at this time due to temp 101.3. 650 mg Tylenol given, will recheck and administer platelets when appropriate.

## 2013-12-02 NOTE — Significant Event (Signed)
Rapid Response Event Note  Overview: Time Called: 1700 Event Type: Hypotension  Initial Focused Assessment: Pt lying in bed, and is lethargic.  Pt is tachypneic and resp are shallow.  Lungs sound congested with wheezing also.  Pt had received MS and resp treatments earlier per bedside RN.  Generalized edema noted.  Remainder of unit of pack cells infusing.   Interventions: Placed on monitor with ST noted.  BP 89/62 (69).     Event Summary: Transferred to rm 1239 via bed.  Cont to be tachypneic.  Dr  Daleen Bo called will consult CCM.   at      at          [place], Jae Dire

## 2013-12-03 ENCOUNTER — Inpatient Hospital Stay (HOSPITAL_COMMUNITY): Payer: BC Managed Care – PPO

## 2013-12-03 DIAGNOSIS — J9620 Acute and chronic respiratory failure, unspecified whether with hypoxia or hypercapnia: Secondary | ICD-10-CM

## 2013-12-03 DIAGNOSIS — I509 Heart failure, unspecified: Secondary | ICD-10-CM

## 2013-12-03 LAB — PREPARE PLATELET PHERESIS: UNIT DIVISION: 0

## 2013-12-03 LAB — CBC WITH DIFFERENTIAL/PLATELET
BASOS ABS: 0.1 10*3/uL (ref 0.0–0.1)
Basophils Relative: 1 % (ref 0–1)
EOS ABS: 0.1 10*3/uL (ref 0.0–0.7)
Eosinophils Relative: 1 % (ref 0–5)
HCT: 28.4 % — ABNORMAL LOW (ref 36.0–46.0)
HEMOGLOBIN: 9.4 g/dL — AB (ref 12.0–15.0)
LYMPHS ABS: 1 10*3/uL (ref 0.7–4.0)
LYMPHS PCT: 10 % — AB (ref 12–46)
MCH: 28.4 pg (ref 26.0–34.0)
MCHC: 33.1 g/dL (ref 30.0–36.0)
MCV: 85.8 fL (ref 78.0–100.0)
MONOS PCT: 3 % (ref 3–12)
Monocytes Absolute: 0.3 10*3/uL (ref 0.1–1.0)
Neutro Abs: 8.5 10*3/uL — ABNORMAL HIGH (ref 1.7–7.7)
Neutrophils Relative %: 85 % — ABNORMAL HIGH (ref 43–77)
Platelets: 36 10*3/uL — ABNORMAL LOW (ref 150–400)
RBC: 3.31 MIL/uL — ABNORMAL LOW (ref 3.87–5.11)
RDW: 20.1 % — AB (ref 11.5–15.5)
WBC Morphology: INCREASED
WBC: 10 10*3/uL (ref 4.0–10.5)

## 2013-12-03 LAB — TROPONIN I: Troponin I: <0.3 ng/mL (ref <0.30)

## 2013-12-03 LAB — BASIC METABOLIC PANEL
BUN: 16 mg/dL (ref 6–23)
CHLORIDE: 100 meq/L (ref 96–112)
CO2: 27 mEq/L (ref 19–32)
Calcium: 8.6 mg/dL (ref 8.4–10.5)
Creatinine, Ser: 1.63 mg/dL — ABNORMAL HIGH (ref 0.50–1.10)
GFR calc Af Amer: 39 mL/min — ABNORMAL LOW (ref >90)
GFR calc non Af Amer: 33 mL/min — ABNORMAL LOW (ref >90)
GLUCOSE: 156 mg/dL — AB (ref 70–99)
POTASSIUM: 3.9 meq/L (ref 3.7–5.3)
Sodium: 140 mEq/L (ref 137–147)

## 2013-12-03 LAB — CBC
HCT: 27.6 % — ABNORMAL LOW (ref 36.0–46.0)
HEMOGLOBIN: 8.9 g/dL — AB (ref 12.0–15.0)
MCH: 28.3 pg (ref 26.0–34.0)
MCHC: 32.2 g/dL (ref 30.0–36.0)
MCV: 87.9 fL (ref 78.0–100.0)
Platelets: 44 10*3/uL — ABNORMAL LOW (ref 150–400)
RBC: 3.14 MIL/uL — ABNORMAL LOW (ref 3.87–5.11)
RDW: 20.2 % — ABNORMAL HIGH (ref 11.5–15.5)
WBC: 13.5 10*3/uL — ABNORMAL HIGH (ref 4.0–10.5)

## 2013-12-03 LAB — EXPECTORATED SPUTUM ASSESSMENT W REFEX TO RESP CULTURE

## 2013-12-03 LAB — EXPECTORATED SPUTUM ASSESSMENT W GRAM STAIN, RFLX TO RESP C

## 2013-12-03 LAB — LACTIC ACID, PLASMA
LACTIC ACID, VENOUS: 0.8 mmol/L (ref 0.5–2.2)
Lactic Acid, Venous: 0.8 mmol/L (ref 0.5–2.2)

## 2013-12-03 LAB — LACTATE DEHYDROGENASE: LDH: 521 U/L — ABNORMAL HIGH (ref 94–250)

## 2013-12-03 NOTE — Progress Notes (Signed)
PULMONARY  / CRITICAL CARE MEDICINE HISTORY AND PHYSICAL EXAMINATION  Name: Lacey [name] MRN: [id number] DOB: 07/10/1956    ADMISSION DATE:  11/03/2013  CHIEF COMPLAINT:  Hypoxic respiratory failure  BRIEF PATIENT DESCRIPTION: 58 year old female with secondary myelodysplasia following treatment for Hodgkin's lymphoma in 2008 and BMT in May 2010. 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 She was admitted to [facility] on 11/19/2013 and has had a prolonged course involving several intensive care unit stays for putative pneumonia. Pulmonary critical care medicine was asked to assume care of the patient on the evening of 12/02/2013 for worsening hypoxic respiratory failure and hypotension.  SIGNIFICANT EVENTS / STUDIES:  2/15  Upper extremity  Dopplers demonstrated acute clot of the right cephalic vein 7/82  Chest CT demonstrated loculated right pleural effusion which does not appear to have intact 2/17  Echocardiogram  demonstrates EF of 95-62% with diastolic dysfunction. 2/24  Abdominal CT  demonstrated hemorrhage in the left iliacus muscle 2/23  Chest CT  demonstrated persistent right pleural effusion and right middle lobe density 2/25  Head CT  demonstrated sphenoid sinusitis 2/26  Bronchoscopy did not reveal an infectious organism 3/3    Lower extremity Dopplers did not detect DVTs 3/6    Chest CT  demonstrated persistent collapse of the right middle and right lower lobes with a large pleural  effusion. There          was development of a new left airspace opacity. 3/8    TTE >>> Ef 65%, no RWMA, PAP 35 torr 3/8    Abdomen CT >>> No findings consistent with sepsis, evolution of L iliacus hematoma   LINES / TUBES: Left-sided PICC 03/06 >>> Foley 3/2 >>>  CULTURES: 3/6  Blood >>> 3/6  Urine >>> neg  ANTIBIOTICS: Previous Courses of Imipenem, Vancomycin, Fluconazole  Acyclovir 3/5 >>> Cefepime 3/7  >>> Micafungin 3/7 >>> Zyvox 3/7 >>>  INTERVAL HISTORY:  Decreased pressor requirements and improved respirtatory status  PHYSICAL EXAM  VITAL SIGNS: Temp:  [96.3 F (35.7 C)-102 F (38.9 C)] 96.3 F (35.7 C) (03/08 0800) Pulse Rate:  [122-132] 132 (03/07 1900) Resp:  [16-29] 27 (03/08 1100) BP: (79-139)/(45-75) 108/61 mmHg (03/07 2100) SpO2:  [92 %-100 %] 95 % (03/08 1100) Arterial Line BP: (88-147)/(49-79) 124/62 mmHg (03/08 1100) Weight:  [89 kg (196 lb 3.4 oz)-90.5 kg (199 lb 8.3 oz)] 89 kg (196 lb 3.4 oz) (03/08 0400)  HEMODYNAMICS: CVP:  [11 mmHg-13 mmHg] 12 mmHg  VENTILATOR SETTINGS:   INTAKE / OUTPUT: Intake/Output     03/07 0701 - 03/08 0700 03/08 0701 - 03/09 0700   I.V. (mL/kg) 105.1 (1.2) 240 (2.7)   Blood     Other 340    IV Piggyback 760 450   Total Intake(mL/kg) 1205.1 (13.5) 690 (7.8)   Urine (mL/kg/hr) 4455 (2.1) 825 (1.8)   Total Output 4455 825   Net -3249.9 -135        Stool Occurrence  1 x     PHYSICAL EXAMINATION: General:  Resting comfortable, in no distress Neuro:  Awake, alert, cooperative HEENT:  PERRL, moist membranes Neck:  No JVD Cardiovascular:  Regular, tachycardic Lungs:  Bilateral air entry, few rales Abdomen:  Soft, mildly distended, positive bowel sounds Musculoskeletal:  Anasarca Skin:  No rash  LABS:  CBC  Recent Labs Lab 12/02/13 0445 12/02/13 2210 12/03/13 0430  WBC 12.5* 13.2* 13.5*  HGB 6.1* 8.9* 8.9*  HCT 19.4* 26.6* 27.6*  PLT 60* 51* 44*   Coag's  Recent Labs Lab 12/02/13 2210  INR 1.33   BMET  Recent Labs Lab 12/02/13 0445 12/02/13 2210 12/03/13 0430  NA 139 141 140  K 4.3 3.9 3.9  CL 101 100 100  CO2 29 28 27   BUN 17 15 16   CREATININE 1.54* 1.62* 1.63*  GLUCOSE 94 91 156*   Electrolytes  Recent Labs Lab 12/02/13 0445 12/02/13 2210 12/03/13 0430  CALCIUM 8.6 8.8 8.6  MG  --  1.8  --   PHOS  --  4.3  --    Sepsis Markers  Recent Labs Lab 12/02/13 2210 12/03/13 0600  12/03/13 0851  LATICACIDVEN 0.7 0.8 0.8   ABG No results found for this basename: PHART, PCO2ART, PO2ART,  in the last 168 hours  Liver Enzymes  Recent Labs Lab 11/30/13 0530 12/02/13 2210  AST 14 18  ALT 18 17  ALKPHOS 69 74  BILITOT 1.0 1.8*  ALBUMIN 2.2* 2.1*   Cardiac Enzymes  Recent Labs Lab 12/02/13 2210 12/03/13 0430 12/03/13 0830  TROPONINI <0.30 <0.30 <0.30   Glucose No results found for this basename: GLUCAP,  in the last 168 hours  IMAGING:   Ct Abdomen Pelvis Wo Contrast  12/03/2013   CLINICAL DATA:  Abdominal pain. Myelodysplasia. Evaluate for possible cause of sepsis.  EXAM: CT ABDOMEN AND PELVIS WITHOUT CONTRAST  TECHNIQUE: Multidetector CT imaging of the abdomen and pelvis was performed following the standard protocol without intravenous contrast.  COMPARISON:  11/24/2013  FINDINGS: BODY WALL: Body wall edema, focal in the left flank where there may have been a contusion.  LOWER CHEST: Moderate right pleural effusion remains simple density and layering where seen. Bibasilar atelectatic changes appear non progressed. No evidence of progressive consolidation.  ABDOMEN/PELVIS:  Liver: 2 hepatic cysts again noted, the larger in segment 7 measuring 22 mm.  Biliary: Gallbladder distention without evidence of inflammation. No biliary ductal dilatation.  Pancreas: Unremarkable.  Spleen: Unremarkable.  Adrenals: Adeniform thickening of the left more than right glands, chronic.  Kidneys and ureters: No hydronephrosis or stone.  Bladder: Decompressed by Foley catheter.  Reproductive: No acute abnormality.  Bowel: No obstruction. Normal appendix.  Retroperitoneum: Hemorrhage related expansion of the left iliacus muscle is not increased. The density is decreasing, compatible with resolving clot. Thickening of the lower lateral Conal fascia and posterior pararenal fascia is stable from prior. No evidence of acute hemorrhage. No gas within the hematoma to suggest superinfection.   Peritoneum: No free fluid or gas.  Vascular: No acute abnormality.  OSSEOUS: No evidence of infection.  IMPRESSION: 1. No intra-abdominal findings to explain sepsis. 2. Expected evolution of left iliacus hematoma. 3. Lower chest findings are stable from 1 day prior.   Electronically Signed   By: Jorje Guild M.D.   On: 12/03/2013 04:39   Dg Chest 1 View  12/02/2013   CLINICAL DATA:  Worsening dyspnea  EXAM: CHEST - 1 VIEW  COMPARISON:  DG CHEST 1V PORT dated 12/02/2013  FINDINGS: Since the earlier study the pulmonary interstitial markings have increased slightly especially on the right. There remains confluent parenchymal density in the right perihilar region and at the right lung base. The right pleural effusion is known to be present and appears stable. The right heart border is obscured. The left heart border is stable. Mild prominence of the central pulmonary vascularity is present and stable.  IMPRESSION: The pulmonary interstitial markings within the right lung appear  slightly more conspicuous than on the earlier study. The left lung has not significantly changed in appearance.   Electronically Signed   By: David  [place]   On: 12/02/2013 18:39   Ct Head Wo Contrast  12/02/2013   CLINICAL DATA:  Confusion and disorientation  EXAM: CT HEAD WITHOUT CONTRAST  TECHNIQUE: Contiguous axial images were obtained from the base of the skull through the vertex without intravenous contrast.  COMPARISON:  11/22/2013  FINDINGS: Motion degradation, especially near the vertex.  Skull and Sinuses:Inflammatory mucosal thickening throughout the imaged paranasal sinuses. Resolved effusion in the left sphenoid sinus. Notable rightward septal deviation.  Orbits: No acute abnormality.  Brain: No evidence of acute abnormality, such as acute infarction, hemorrhage, hydrocephalus, or mass lesion/mass effect.  IMPRESSION: 1. No evidence of acute intracranial disease. 2. Inflammatory paranasal sinus disease without effusion.    Electronically Signed   By: Jorje Guild M.D.   On: 12/02/2013 20:05   [place] Chest  12/03/2013   CLINICAL DATA:  Right pleural effusion and possible thoracentesis.  EXAM: CHEST ULTRASOUND  COMPARISON:  CT ABD/PELV WO CM dated 12/02/2013; DG CHEST 1 VIEW dated 12/02/2013; CT CHEST W/O CM dated 12/01/2013  FINDINGS: By ultrasound, the right pleural effusion is now fairly small in size and is complex and loculated. Thoracentesis was not performed today.  IMPRESSION: Right pleural fusion is fairly small in volume and both loculated and complex appearing by ultrasound. Thoracentesis was not performed today.   Electronically Signed   By: Aletta Edouard M.D.   On: 12/03/2013 10:48   Dg Chest Port 1 View  12/02/2013   CLINICAL DATA:  Worsening shortness of breath and productive cough  EXAM: PORTABLE CHEST - 1 VIEW  COMPARISON:  12/01/2013.  FINDINGS: There is persistent, moderate right pleural effusion which appears unchanged from previous exam. Right upper lobe airspace consolidation and atelectasis is unchanged from previous exam. Persistent patchy airspace disease within the left upper lobe and left lower lobe is also unchanged.  IMPRESSION: 1. No change and right pleural effusion. 2. Bilateral areas of consolidation are also unchanged.   Electronically Signed   By: Kerby Moors M.D.   On: 12/02/2013 07:50   ASSESSMENT / PLAN: PULMONARY A: Acute respiratory failure More likely acute pulmonary edema as improved dramatically with diuresis Doubt pulmonary embolism Less likely HCAP Pleural effusion R P: Goal SpO2>92 Supplemental oxygen Diuresis / abx as below Defer thoracentesis - thrombocytopenia, low suspicion for infection and improved clinical status  CARDIOVASCULAR A: Sepsis Hypotension in setting of presumed adrenal insufficiency, lactate 0.8 reassuring Acute diastolic heart failure P:  Goal MAP>65 Neo-synephrine  RENAL A: AKI Hypervolemia ( stay balance 8 L ) P:  Trend BMP Lasix 60  bid  GASTROINTESTINAL A: Nutrition GI Px P:  Advance diet Protonix  HEMATOLOGIC A: Myelodysplasia s/p BMT Transfusion dependent anemia and thrombocytopenia VTE Px P:  Oncology following Trend CBC  INFECTIOUS A: Suspected HCAP P: Abx as above  ENDOCRINE A: Adrenal infarcts Suspected adrenal insufficiency P: Hydrocortisone  NEUROLOGIC A: Acute encephalopathy, resolved pain P: Fentanyl PRN Cymbalta  I have personally obtained history, examined patient, evaluated and interpreted laboratory and imaging results, reviewed medical records, formulated assessment / plan and placed orders.  CRITICAL CARE:  The patient is critically ill with multiple organ systems failure and requires high complexity decision making for assessment and support, frequent evaluation and titration of therapies, application of advanced monitoring technologies and extensive interpretation of multiple databases. Critical Care Time devoted to patient  care services described in this note is 40 minutes.   Doree Fudge, MD Pulmonary and [place] Pager: [phone]  12/03/2013, 4:47 PM

## 2013-12-03 NOTE — Progress Notes (Signed)
Patient scheduled for diagnostic thoracentesis today, upon reviewing ultrasound images many small septations with minimal fluid within. Procedure was cancelled not amendable to percutaneous approach, husband and patient expressed concerns of the risk of procedure with the chance of minimal to no fluid obtained.  Tsosie Billing PA-C Interventional Radiology  12/03/13  8:43 AM

## 2013-12-03 NOTE — Progress Notes (Signed)
No surprise in the she is down to the ICU again.  Looks like pneumonia. No thoracentesis as pleural fluid too small. She got 2 units of pRBC on 3/7.  She looks better. Hgb is 8.9.  Another fever yesterday.  ? Related to transfusion.  No (+) cultures. Continues on broad spectrum abx. Poor appetite. On Marinol.  I will try Megace. The left pelvic hematoma is resolving.   No as much cough. The tongue ulcer is improving. Cx from this is (-). Was on pressors last night.  Now off. I am going to have to "bite the bullet" and start treating the MDS.  I really do not see how we can "break this cycle" of her getting recurring "bouts" of "sepsis." I will try either single agent Vidaza or Decitabine. Will start in the AM. Pletlet count is 44K. Kidney function is ok.  On her exam, the tongue ulcer looks better.  No mucositis. Lungs sound more open. Cardiac exam is tachy, but regular.  Abdomen is not as distended.   Again, this is incredibly complicated and I very much appreciate everybody's help!!!    I updated her husband.  He will be out of town and I will call him daily with "the latest."  Pete E.  Isaiah 41:10

## 2013-12-03 NOTE — Progress Notes (Addendum)
Shift summary: Gradually became more alert, and by 0400, she was awake. Reoriented by nurse. Temp. Down, HR down, Neosynephrine weaned to off by 0500. Bilateral breath sounds are rhonchi, with expiratory wheezes. Decreased breath sounds mid to lower right lung;( pleural effusion by CT done at 2030 on 12/02/13). On 4l [place] with no significant WOB. Denies pain.  Abdomen distended with hypoactive bowel sounds.Pale and edematous. IV fluids at [facility] via [place]. Aline inserted into Right radial artery. Condition improved ,however guarded .Husband states  that DR. Ennever plans to meet with him this morning.Urine output low adequate.

## 2013-12-03 NOTE — Progress Notes (Signed)
  Echocardiogram 2D Echocardiogram has been performed.  Donata Clay 12/03/2013, 10:12 AM

## 2013-12-04 DIAGNOSIS — M7989 Other specified soft tissue disorders: Secondary | ICD-10-CM

## 2013-12-04 DIAGNOSIS — M79609 Pain in unspecified limb: Secondary | ICD-10-CM

## 2013-12-04 LAB — BASIC METABOLIC PANEL
BUN: 21 mg/dL (ref 6–23)
BUN: 25 mg/dL — AB (ref 6–23)
CHLORIDE: 98 meq/L (ref 96–112)
CHLORIDE: 99 meq/L (ref 96–112)
CO2: 30 mEq/L (ref 19–32)
CO2: 31 mEq/L (ref 19–32)
CREATININE: 1.5 mg/dL — AB (ref 0.50–1.10)
Calcium: 8.1 mg/dL — ABNORMAL LOW (ref 8.4–10.5)
Calcium: 8.1 mg/dL — ABNORMAL LOW (ref 8.4–10.5)
Creatinine, Ser: 1.53 mg/dL — ABNORMAL HIGH (ref 0.50–1.10)
GFR calc Af Amer: 43 mL/min — ABNORMAL LOW (ref >90)
GFR calc non Af Amer: 37 mL/min — ABNORMAL LOW (ref >90)
GFR calc non Af Amer: 36 mL/min — ABNORMAL LOW (ref >90)
GFR, EST AFRICAN AMERICAN: 42 mL/min — AB (ref >90)
GLUCOSE: 144 mg/dL — AB (ref 70–99)
Glucose, Bld: 135 mg/dL — ABNORMAL HIGH (ref 70–99)
POTASSIUM: 2.6 meq/L — AB (ref 3.7–5.3)
Potassium: 3 mEq/L — ABNORMAL LOW (ref 3.7–5.3)
Sodium: 141 mEq/L (ref 137–147)
Sodium: 142 mEq/L (ref 137–147)

## 2013-12-04 LAB — CBC
HEMATOCRIT: 26.6 % — AB (ref 36.0–46.0)
Hemoglobin: 8.7 g/dL — ABNORMAL LOW (ref 12.0–15.0)
MCH: 28.4 pg (ref 26.0–34.0)
MCHC: 32.7 g/dL (ref 30.0–36.0)
MCV: 86.9 fL (ref 78.0–100.0)
PLATELETS: 32 10*3/uL — AB (ref 150–400)
RBC: 3.06 MIL/uL — AB (ref 3.87–5.11)
RDW: 19.9 % — ABNORMAL HIGH (ref 11.5–15.5)
WBC: 8.3 10*3/uL (ref 4.0–10.5)

## 2013-12-04 LAB — TYPE AND SCREEN
ABO/RH(D): O POS
Antibody Screen: POSITIVE
DAT, IGG: NEGATIVE
DONOR AG TYPE: NEGATIVE
Donor AG Type: NEGATIVE
PT AG Type: NEGATIVE
UNIT DIVISION: 0
UNIT DIVISION: 0

## 2013-12-04 LAB — CREATININE, URINE, RANDOM: CREATININE, URINE: 62.8 mg/dL

## 2013-12-04 LAB — VIRAL CULTURE VIRC

## 2013-12-04 LAB — CMV CULTURE CMVC

## 2013-12-04 LAB — LACTATE DEHYDROGENASE: LDH: 458 U/L — ABNORMAL HIGH (ref 94–250)

## 2013-12-04 LAB — UREA NITROGEN, URINE: Urea Nitrogen, Ur: 262 mg/dL

## 2013-12-04 LAB — NA AND K (SODIUM & POTASSIUM), RAND UR
POTASSIUM UR: 52 meq/L
SODIUM UR: 42 meq/L

## 2013-12-04 MED ORDER — POTASSIUM CHLORIDE CRYS ER 20 MEQ PO TBCR
40.0000 meq | EXTENDED_RELEASE_TABLET | Freq: Once | ORAL | Status: AC
  Administered 2013-12-04: 40 meq via ORAL
  Filled 2013-12-04: qty 2

## 2013-12-04 MED ORDER — POTASSIUM CHLORIDE 10 MEQ/50ML IV SOLN
10.0000 meq | INTRAVENOUS | Status: AC
  Administered 2013-12-04 (×4): 10 meq via INTRAVENOUS
  Filled 2013-12-04 (×4): qty 50

## 2013-12-04 MED ORDER — POTASSIUM CHLORIDE CRYS ER 20 MEQ PO TBCR
40.0000 meq | EXTENDED_RELEASE_TABLET | Freq: Two times a day (BID) | ORAL | Status: DC
  Administered 2013-12-04 – 2013-12-05 (×4): 40 meq via ORAL
  Filled 2013-12-04 (×6): qty 2

## 2013-12-04 MED ORDER — ACETAMINOPHEN 325 MG PO TABS
650.0000 mg | ORAL_TABLET | ORAL | Status: DC | PRN
  Administered 2013-12-04 – 2013-12-09 (×3): 650 mg via ORAL
  Filled 2013-12-04 (×3): qty 2

## 2013-12-04 MED ORDER — POTASSIUM CHLORIDE 10 MEQ/50ML IV SOLN
10.0000 meq | INTRAVENOUS | Status: AC
  Administered 2013-12-04 – 2013-12-05 (×3): 10 meq via INTRAVENOUS
  Filled 2013-12-04 (×3): qty 50

## 2013-12-04 NOTE — Progress Notes (Signed)
PULMONARY  / CRITICAL CARE MEDICINE HISTORY AND PHYSICAL EXAMINATION  Name: Lacey [name] MRN: [id number] DOB: 1956/09/23    ADMISSION DATE:  11/14/2013  CHIEF COMPLAINT:  Hypoxic respiratory failure  BRIEF PATIENT DESCRIPTION: 58 year-old female with secondary myelodysplasia following treatment for Hodgkin's lymphoma in 2008 and BMT in May 2010. 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 She was admitted to [facility] on 11/09/2013 and has had a prolonged course involving several intensive care unit stays for putative pneumonia. Pulmonary critical care medicine was asked to assume care of the patient on the evening of 12/02/2013 for worsening hypoxic respiratory failure and hypotension.  SIGNIFICANT EVENTS / STUDIES:  2/15  Upper extremity  Dopplers demonstrated acute clot of the right cephalic vein 5/78  Chest CT demonstrated loculated right pleural effusion which does not appear to have intact 2/17  Echocardiogram  demonstrates EF of 46-96% with diastolic dysfunction. 2/24  Abdominal CT  demonstrated hemorrhage in the left iliacus muscle 2/23  Chest CT  demonstrated persistent right pleural effusion and right middle lobe density 2/25  Head CT  demonstrated sphenoid sinusitis 2/26  Bronchoscopy did not reveal an infectious organism 3/3    Lower extremity Dopplers did not detect DVTs 3/6    Chest CT> persistent collapse of the RML, RLL with a large pleural effusion. Development of a new left airspace opacity. 3/8    TTE >>> Ef 65%, no RWMA, PAP 35 torr 3/8    Abdomen CT >>> No findings consistent with sepsis, evolution of L iliac hematoma 3/8    [place] R Effusion>>>small in volume, loculated & complex appearing by [place], NO [place] / TUBES: Left-sided PICC 03/06 >>> Foley 3/2 >>> R ALine ?>>>3/9  CULTURES: 3/6  Blood >>> 3/6  Urine >>> neg 3/8  Sputum>>>neg  ANTIBIOTICS: Previous Courses of Imipenem,  Vancomycin, Fluconazole  Acyclovir 3/5 >>> Cefepime 3/7 >>> Micafungin 3/7 >>> Zyvox 3/7 >>>  INTERVAL HISTORY:  Off pressors since last pm. Hypokalemia replaced by Elink MD.  No acute events.   PHYSICAL EXAM  VITAL SIGNS: Temp:  [96.7 F (35.9 C)-97.5 F (36.4 C)] 96.8 F (36 C) (03/09 0400) Resp:  [15-27] 17 (03/09 0600) SpO2:  [94 %-97 %] 96 % (03/09 0600) Arterial Line BP: (90-148)/(56-79) 148/70 mmHg (03/09 0600) Weight:  [195 lb 5.2 oz (88.6 kg)] 195 lb 5.2 oz (88.6 kg) (03/09 0500)  HEMODYNAMICS: CVP:  [8 mmHg-14 mmHg] 9 mmHg  VENTILATOR SETTINGS:   INTAKE / OUTPUT: Intake/Output     03/08 0701 - 03/09 0700 03/09 0701 - 03/10 0700   P.O. 200    I.V. (mL/kg) 1420 (16)    Other     IV Piggyback 1350    Total Intake(mL/kg) 2970 (33.5)    Urine (mL/kg/hr) 4150 (2)    Total Output 4150     Net -1180          Stool Occurrence 2 x      PHYSICAL EXAMINATION: General:  Resting comfortable, in no distress Neuro:  Awake, alert, cooperative HEENT:  PERRL, moist membranes Neck:  No JVD Cardiovascular:  s1s2 rrr, no m/r/g Lungs:  resp's even/non-labored on [place] O2, lungs bilaterally clear Abdomen:  Soft, mildly distended, positive bowel sounds Musculoskeletal:  Anasarca Skin:  No rash  LABS:  CBC  Recent Labs Lab 12/03/13 0430 12/03/13 1720 12/04/13 0415  WBC 13.5* 10.0 8.3  HGB 8.9* 9.4* 8.7*  HCT 27.6* 28.4* 26.6*  PLT 44* 36* 32*   Coag's  Recent Labs Lab 12/02/13 2210  INR 1.33   BMET  Recent Labs Lab 12/02/13 2210 12/03/13 0430 12/04/13 0415  NA 141 140 141  K 3.9 3.9 2.6*  CL 100 100 98  CO2 28 27 30   BUN 15 16 21   CREATININE 1.62* 1.63* 1.50*  GLUCOSE 91 156* 144*   Electrolytes  Recent Labs Lab 12/02/13 2210 12/03/13 0430 12/04/13 0415  CALCIUM 8.8 8.6 8.1*  MG 1.8  --   --   PHOS 4.3  --   --    Sepsis Markers  Recent Labs Lab 12/02/13 2210 12/03/13 0600 12/03/13 0851  LATICACIDVEN 0.7 0.8 0.8   ABG No results  found for this basename: PHART, PCO2ART, PO2ART,  in the last 168 hours  Liver Enzymes  Recent Labs Lab 11/30/13 0530 12/02/13 2210  AST 14 18  ALT 18 17  ALKPHOS 69 74  BILITOT 1.0 1.8*  ALBUMIN 2.2* 2.1*   Cardiac Enzymes  Recent Labs Lab 12/02/13 2210 12/03/13 0430 12/03/13 0830  TROPONINI <0.30 <0.30 <0.30   Glucose No results found for this basename: GLUCAP,  in the last 168 hours  IMAGING:   Ct Abdomen Pelvis Wo Contrast  12/03/2013   CLINICAL DATA:  Abdominal pain. Myelodysplasia. Evaluate for possible cause of sepsis.  EXAM: CT ABDOMEN AND PELVIS WITHOUT CONTRAST  TECHNIQUE: Multidetector CT imaging of the abdomen and pelvis was performed following the standard protocol without intravenous contrast.  COMPARISON:  11/24/2013  FINDINGS: BODY WALL: Body wall edema, focal in the left flank where there may have been a contusion.  LOWER CHEST: Moderate right pleural effusion remains simple density and layering where seen. Bibasilar atelectatic changes appear non progressed. No evidence of progressive consolidation.  ABDOMEN/PELVIS:  Liver: 2 hepatic cysts again noted, the larger in segment 7 measuring 22 mm.  Biliary: Gallbladder distention without evidence of inflammation. No biliary ductal dilatation.  Pancreas: Unremarkable.  Spleen: Unremarkable.  Adrenals: Adeniform thickening of the left more than right glands, chronic.  Kidneys and ureters: No hydronephrosis or stone.  Bladder: Decompressed by Foley catheter.  Reproductive: No acute abnormality.  Bowel: No obstruction. Normal appendix.  Retroperitoneum: Hemorrhage related expansion of the left iliacus muscle is not increased. The density is decreasing, compatible with resolving clot. Thickening of the lower lateral Conal fascia and posterior pararenal fascia is stable from prior. No evidence of acute hemorrhage. No gas within the hematoma to suggest superinfection.  Peritoneum: No free fluid or gas.  Vascular: No acute  abnormality.  OSSEOUS: No evidence of infection.  IMPRESSION: 1. No intra-abdominal findings to explain sepsis. 2. Expected evolution of left iliacus hematoma. 3. Lower chest findings are stable from 1 day prior.   Electronically Signed   By: Jorje Guild M.D.   On: 12/03/2013 04:39   Dg Chest 1 View  12/02/2013   CLINICAL DATA:  Worsening dyspnea  EXAM: CHEST - 1 VIEW  COMPARISON:  DG CHEST 1V PORT dated 12/02/2013  FINDINGS: Since the earlier study the pulmonary interstitial markings have increased slightly especially on the right. There remains confluent parenchymal density in the right perihilar region and at the right lung base. The right pleural effusion is known to be present and appears stable. The right heart border is obscured. The left heart border is stable. Mild prominence of the central pulmonary vascularity is present and stable.  IMPRESSION: The pulmonary interstitial markings within the right lung appear  slightly more conspicuous than on the earlier study. The left lung has not significantly changed in appearance.   Electronically Signed   By: David  Martin[place]   On: 12/02/2013 18:39   Ct Head Wo Contrast  12/02/2013   CLINICAL DATA:  Confusion and disorientation  EXAM: CT HEAD WITHOUT CONTRAST  TECHNIQUE: Contiguous axial images were obtained from the base of the skull through the vertex without intravenous contrast.  COMPARISON:  11/22/2013  FINDINGS: Motion degradation, especially near the vertex.  Skull and Sinuses:Inflammatory mucosal thickening throughout the imaged paranasal sinuses. Resolved effusion in the left sphenoid sinus. Notable rightward septal deviation.  Orbits: No acute abnormality.  Brain: No evidence of acute abnormality, such as acute infarction, hemorrhage, hydrocephalus, or mass lesion/mass effect.  IMPRESSION: 1. No evidence of acute intracranial disease. 2. Inflammatory paranasal sinus disease without effusion.   Electronically Signed   By: Jorje Guild M.D.   On:  12/02/2013 20:05   [place] Chest  12/03/2013   CLINICAL DATA:  Right pleural effusion and possible thoracentesis.  EXAM: CHEST ULTRASOUND  COMPARISON:  CT ABD/PELV WO CM dated 12/02/2013; DG CHEST 1 VIEW dated 12/02/2013; CT CHEST W/O CM dated 12/01/2013  FINDINGS: By ultrasound, the right pleural effusion is now fairly small in size and is complex and loculated. Thoracentesis was not performed today.  IMPRESSION: Right pleural fusion is fairly small in volume and both loculated and complex appearing by ultrasound. Thoracentesis was not performed today.   Electronically Signed   By: Aletta Edouard M.D.   On: 12/03/2013 10:48   ASSESSMENT / PLAN: PULMONARY A: Acute respiratory failure - doubt PE, less likely HCAP Pulmonary Edema - as improved dramatically with diuresis Pleural effusion R P: Goal SpO2>92 3/ Supplemental oxygen Diuresis / abx as below Defer thoracentesis - thrombocytopenia, low suspicion for infection and improved clinical status  CARDIOVASCULAR A: Sepsis Hypotension - in setting of presumed adrenal insufficiency, lactate 0.8 reassuring Acute diastolic heart failure P:  Goal MAP>65 Neo-synephrine weaned off 3/8 PM D/c aline  RENAL A: AKI Hypervolemia ( stay balance 8 L ) P:  Trend BMP Lasix 60 bid + 40 mEq KCL bid Discontinue CVP Continue foley w diuresis  GASTROINTESTINAL A: Nutrition GI Px P:  Advance diet as tolerated  Protonix  HEMATOLOGIC A: Myelodysplasia s/p BMT Transfusion dependent anemia and thrombocytopenia VTE Px RUE Swelling  P:  Oncology following Trend CBC Assess RUE for DVT  INFECTIOUS A: Suspected HCAP P: Abx as above  ENDOCRINE A: Adrenal infarcts Suspected adrenal insufficiency P: Hydrocortisone  NEUROLOGIC A: Acute encephalopathy, resolved Pain P: Fentanyl PRN Cymbalta OOB to [place], NP-C [place] [facility] Pgr: [phone] or [phone]   12/04/2013, 9:56 AM  Reviewed above, and  examined pt.  Hemodynamics improved, and off pressors.  Continue Abx..  Will transfer pt to SDU status.  Will ask [facility] to assume care from 3/10 and PCCM sign off.  Chesley Mires, MD [facility] Pulmonary/Critical Care 12/04/2013, 11:56 AM Pager:  [phone] After 3pm call: [phone]

## 2013-12-04 NOTE — Progress Notes (Signed)
Nutrition Education Note  RD drawn to chart secondary to current prescription of Zyvox, a medication with potential interactions with high tyramine-containing foods. Provided education of low tyramine diet and discussed relationship between Zyvox and tyramine. Pt expressed understanding, handouts provided.   Hospitalized diet is not high in tyramine and poses no risk for patient at this time.   No nutrition interventions warranted at this time. RD contact information provided.  Mikey College MS, [place], [place] Pager [phone] After Hours Pager

## 2013-12-04 NOTE — Progress Notes (Signed)
eLink Physician-Brief Progress Note Patient Name: Lacey [name] DOB: 1956/06/14 MRN: [id number]  Date of Service  12/04/2013   HPI/Events of Note     eICU Interventions  Hypokalemia -repleted    Intervention Category Intermediate Interventions: Electrolyte abnormality - evaluation and management  Lacey [name] V. 12/04/2013, 5:48 AM

## 2013-12-04 NOTE — Progress Notes (Signed)
*  PRELIMINARY RESULTS* Vascular Ultrasound Right upper extremity venous duplex has been completed.  Preliminary findings: No evidence of DVT. Superficial thrombosis noted in the right cephalic vein at the [facility] to mid upper arm level.  Consistent with study from 11/12/13.   Landry Mellow, RDMS, RVT  12/04/2013, 12:52 PM

## 2013-12-04 NOTE — Progress Notes (Signed)
[facility] Progress Note Patient Name: Lacey [name] DOB: 09-22-1956 MRN: [id number]  Date of Service  12/04/2013   HPI/Events of Note   k low, getting 40 oral now  eICU Interventions  Add 3 runs   Intervention Category Major Interventions: Electrolyte abnormality - evaluation and management  Raylene Miyamoto. 12/04/2013, 10:24 PM

## 2013-12-05 ENCOUNTER — Inpatient Hospital Stay (HOSPITAL_COMMUNITY): Payer: BC Managed Care – PPO

## 2013-12-05 ENCOUNTER — Encounter: Payer: Self-pay | Admitting: Hematology & Oncology

## 2013-12-05 DIAGNOSIS — D599 Acquired hemolytic anemia, unspecified: Secondary | ICD-10-CM

## 2013-12-05 LAB — BASIC METABOLIC PANEL
BUN: 25 mg/dL — AB (ref 6–23)
CALCIUM: 7.9 mg/dL — AB (ref 8.4–10.5)
CO2: 31 mEq/L (ref 19–32)
Chloride: 100 mEq/L (ref 96–112)
Creatinine, Ser: 1.49 mg/dL — ABNORMAL HIGH (ref 0.50–1.10)
GFR calc Af Amer: 43 mL/min — ABNORMAL LOW (ref >90)
GFR, EST NON AFRICAN AMERICAN: 37 mL/min — AB (ref >90)
Glucose, Bld: 143 mg/dL — ABNORMAL HIGH (ref 70–99)
Potassium: 3.2 mEq/L — ABNORMAL LOW (ref 3.7–5.3)
Sodium: 142 mEq/L (ref 137–147)

## 2013-12-05 LAB — CBC
HEMATOCRIT: 26.2 % — AB (ref 36.0–46.0)
Hemoglobin: 8.9 g/dL — ABNORMAL LOW (ref 12.0–15.0)
MCH: 29.1 pg (ref 26.0–34.0)
MCHC: 34 g/dL (ref 30.0–36.0)
MCV: 85.6 fL (ref 78.0–100.0)
Platelets: 35 10*3/uL — ABNORMAL LOW (ref 150–400)
RBC: 3.06 MIL/uL — ABNORMAL LOW (ref 3.87–5.11)
RDW: 19.8 % — AB (ref 11.5–15.5)
WBC: 5.5 10*3/uL (ref 4.0–10.5)

## 2013-12-05 LAB — LACTATE DEHYDROGENASE: LDH: 447 U/L — ABNORMAL HIGH (ref 94–250)

## 2013-12-05 MED ORDER — HYDROCORTISONE SOD SUCCINATE 100 MG IJ SOLR
50.0000 mg | Freq: Three times a day (TID) | INTRAMUSCULAR | Status: DC
  Administered 2013-12-05 – 2013-12-06 (×3): 50 mg via INTRAVENOUS
  Filled 2013-12-05 (×6): qty 1

## 2013-12-05 MED ORDER — GUAIFENESIN 100 MG/5ML PO SYRP
200.0000 mg | ORAL_SOLUTION | ORAL | Status: DC | PRN
  Administered 2013-12-05 – 2013-12-11 (×6): 200 mg via ORAL
  Filled 2013-12-05 (×6): qty 10

## 2013-12-05 MED ORDER — IPRATROPIUM BROMIDE 0.02 % IN SOLN
0.5000 mg | RESPIRATORY_TRACT | Status: DC | PRN
  Administered 2013-12-05: 0.5 mg via RESPIRATORY_TRACT
  Filled 2013-12-05: qty 2.5

## 2013-12-05 MED ORDER — ALBUTEROL SULFATE (2.5 MG/3ML) 0.083% IN NEBU
2.5000 mg | INHALATION_SOLUTION | RESPIRATORY_TRACT | Status: DC | PRN
  Administered 2013-12-05 – 2013-12-11 (×3): 2.5 mg via RESPIRATORY_TRACT
  Filled 2013-12-05 (×3): qty 3

## 2013-12-05 MED ORDER — POTASSIUM CHLORIDE CRYS ER 20 MEQ PO TBCR
40.0000 meq | EXTENDED_RELEASE_TABLET | Freq: Once | ORAL | Status: AC
  Administered 2013-12-05: 40 meq via ORAL
  Filled 2013-12-05: qty 2

## 2013-12-05 NOTE — Progress Notes (Signed)
[facility] PROGRESS NOTE  Lacey [name] JQB:[id number] DOB: Nov 06, 1955 DOA: 10/30/2013 PCP: Vena Austria, MD  Brief summary  58 y.o. female with history of Hodgkin's lymphoma status post chemotherapy now with myelodysplastic syndrome who initially presented with abdominal pain, nausea, vomiting, hypotension and complicated with adrenal infarct, adrenal insufficiency, HCAP, recurrent sepsis, hypotension RNF<-->ICU/pressure support+IV atx;  -She also developed hemorrhage into her left iliopsoas muscle which caused weakness and pain of her left leg and LLQ. -Dr. Marin Olp is determining what treatment to initiate for her MDS as she has required frequent transfusions.   Assessment/Plan  1. 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 Transforming leukemia  -Refractory anemia and thrombocytopenia requiring almost daily blood product transfusions, Bone Marrow Biopsy: HYPERCELLULAR BONE MARROW FOR AGE WITH DYSPOIETIC CHANGES. -myelodysplastic/myeloproliferative process, particularly chronic myelomonocytic leukemia - Appreciate Dr Jonette Eva assistance. on frequent daily transfusions (PRBC+platelets) as patient is developing antibodies. Per hem/onc to start [place] on 3/10  2. Recurrent fever/septic shock, pneumonia Likely due dysfunction leukocytes as above  -CT chest on 2/23: Persistent consolidation RML with new peribronchovascular opacities suggestive of bronchopneumonia. Underwent bronch on 2/26; BCx NGTD,  Diff PCR neg, BAL: PCP neg, Bacterial, fungal, and AFB cultures NGTD  - 3/6: febrile; CT: Persistent right upper lobe collapse/ consolidation with interval development of central airspace disease in the left upper lobe  - 3/6: restarted IV atx, antifungals, acyclovir  -off pressors; clinically improving, started taper IV steroids 3/10   3. Acute on chronic diastolic heart failure due to IVF from resuscitation. Echo (2/15): Preserved EF and grade 1 diastolic heart  failure -R sided pleural effusion; anasarca, cont IV lasix; Daily weights and strict I/O; monitor renal function -per pulmonology deferred thoracentesis due to thrombocytopenia, R sided pleural effusion    4. 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 Acute Renal Failure due to septic shock; RUS wnl. Hypervolemic  - resumed lasix due to respiratory status; close monitor  6. Spontaneous retroperitoneal bleed in setting of thrombocytopenia; hemorrhage into her left iliopsoas muscle which caused weakness and pain of her left leg and LLQ - on fentanyl; cont pain control  7. Right upper extremity superficial thrombosis, stable  - Not on ac due to thrombocytopenia  8. Transaminitis due to septic shock, resolved.  9. Urge and stress incontinence; Foley placed for comfort  10. Tongue ulcer; cont as above; biopsied by ENT; path: SQUAMOUS EPITHELIUM. NO DYSPLASIA OR MALIGNANCY -improving    TF to RNF 3/10 -PT eval   Proph: Hold for thrombocytopenia, spont retroperitoneal bleed   Code Status: full  Family Communication: patient alone,  Disposition Plan: Clinically improving. PT/OT recommending CIR vs. SNF.   Consultants:  Dr. Burney Gauze (oncology)  Dr Gibson Ramp (Surgery) curbside consult  Dr. Carlyle Basques (infectious disease)    Procedures:  11/12/2013 right upper extremity Doppler  No evidence of deep vein thrombosis involving the right upper extremity, left subclavian vein, and left internal jugular vein. There is superficial thrombosis noted in the right cephalic vein.  Old PICC removed 3/5; Left-sided PICC 03/06 >>>  Foley 3/2 >>>   -2/12 Bone marrow biopsy report pending  Echocardiogram 01/10/2013  - Left ventricle: mild focal basal hypertrophy of the septum.  -LVEF= ejection fraction was 55%. Wall motion was normal; there were no regional wall motion  abnormalities.  -(grade 1 diastolic dysfunction).  - Aortic valve: There was no stenosis.  Trivial regurgitation. - Mitral valve: Trivial regurgitation. - Left atrium: The atrium was mildly dilated. - Right ventricle: The cavity size was normal. Systolic function was normal. - Tricuspid valve: Peak RV-RA gradient: 58m Hg (S). - Pulmonary arteries: PA peak pressure: 271mHg (S). - Inferior vena cava: The vessel was normal in size; the respirophasic diameter changes were in the normal range (=50%); findings are consistent with normal central venous pressure.  Antibiotics:  Previous Courses of Imipenem, Vancomycin, Fluconazole  Acyclovir 3/5 >>>  Cefepime 3/7 >>>  Micafungin 3/7 >>>  Zyvox 3/7 >>>   2/15 Upper extremity Dopplers demonstrated acute clot of the right cephalic vein  2/0/34hest CT demonstrated loculated right pleural effusion which does not appear to have intact  2/17 Echocardiogram demonstrates EF of 5574-25%ith diastolic dysfunction.  2/24 Abdominal CT demonstrated hemorrhage in the left iliacus muscle  2/23 Chest CT demonstrated persistent right pleural effusion and right middle lobe density  2/25 Head CT demonstrated sphenoid sinusitis  2/26 Bronchoscopy did not reveal an infectious organism  3/3 Lower extremity Dopplers did not detect DVTs  3/6 Chest CT> persistent collapse of the RML, RLL with a large pleural effusion. Development of a new left airspace opacity.  3/8 TTE >>> Ef 65%, no RWMA, PAP 35 torr  3/8 Abdomen CT >>> No findings consistent with sepsis, evolution of L iliac hematoma  3/8 [place] Effusion>>>small in volume, loculated & complex appearing by [place] [facility] Consultants:  Oncology  HPI/Subjective: alert  Objective: Filed Vitals:   12/05/13 0600  BP:   Pulse:   Temp:   Resp: 16    Intake/Output Summary (Last 24 hours) at 12/05/13 0833 Last data filed at 12/05/13 0600  Gross per 24 hour  Intake   2174 ml  Output   3751 ml  Net  -1577 ml    Filed Weights   12/03/13 0400 12/04/13 0500 12/05/13 0419  Weight: 89 kg (196 lb 3.4 oz) 88.6 kg (195 lb 5.2 oz) 87 kg (191 lb 12.8 oz)    Exam:   General:  alert  Cardiovascular: s1,s2 rrr  Respiratory: few LL crackles   Abdomen: soft, nt, nd   Musculoskeletal: LE edema   Data Reviewed: Basic Metabolic Panel:  Recent Labs Lab 12/02/13 2210 12/03/13 0430 12/04/13 0415 12/04/13 2005 12/05/13 0415  NA 141 140 141 142 142  K 3.9 3.9 2.6* 3.0* 3.2*  CL 100 100 98 99 100  CO2 _0 GLUCOSE 91 156* 144* 135* 143*  BUN _1 25* 25*  CREATININE 1.62* 1.63* 1.50* 1.53* 1.49*  CALCIUM 8.8 8.6 8.1* 8.1* 7.9*  MG 1.8  --   --   --   --   PHOS 4.3  --   --   --   --    Liver Function Tests:  Recent Labs Lab 11/30/13 0530 12/02/13 2210  AST 14 18  ALT 18 17  ALKPHOS 69 74  BILITOT 1.0 1.8*  PROT 6.4 6.6  ALBUMIN 2.2* 2.1*   No results found for this basename: LIPASE, AMYLASE,  in the last 168 hours No results found for this basename: AMMONIA,  in the last 168 hours CBC:  Recent Labs Lab 12/01/13 0512  12/02/13 2210 12/03/13 0430 12/03/13 1720 12/04/13 0415 12/05/13 0415  WBC 17.2*  < > 13.2* 13.5* 10.0 8.3 5.5  NEUTROABS 13.1*  --  10.7*  --  8.5*  --   --  HGB 7.2*  < > 8.9* 8.9* 9.4* 8.7* 8.9*  HCT 22.7*  < > 26.6* 27.6* 28.4* 26.6* 26.2*  MCV 90.1  < > 86.9 87.9 85.8 86.9 85.6  PLT 16*  < > 51* 44* 36* 32* 35*  < > = values in this interval not displayed. Cardiac Enzymes:  Recent Labs Lab 12/02/13 2210 12/03/13 0430 12/03/13 0830  TROPONINI <0.30 <0.30 <0.30   BNP (last 3 results)  Recent Labs  11/14/13 0435 11/17/13 0800 11/20/13 0515  PROBNP 2977.0* 9541.0* 3178.0*   CBG: No results found for this basename: GLUCAP,  in the last 168 hours  Recent Results (from the past 240 hour(s))  CMV CULTURE CMVC     Status: None   Collection Time    11/30/13 12:24 PM      Result Value Ref Range Status   Specimen Description  MOUTH TONGUE ULCER   Final   Special Requests Immunocompromised   Final   Culture     Final   Value: No Cytomegalovirus identified in cell culture                                                                A negative result does not exclude the possibility of CMV infection;inappropriate specimen collection,storage and transport may lead to false      negative results.     Performed at [facility]   Report Status 12/04/2013 FINAL   Final  FUNGUS CULTURE W SMEAR     Status: None   Collection Time    11/30/13 12:25 PM      Result Value Ref Range Status   Specimen Description ORAL TONGUE ULCER   Final   Special Requests Immunocompromised   Final   Fungal Smear     Final   Value: NO YEAST OR FUNGAL ELEMENTS SEEN     Performed at [facility]   Culture     Final   Value: Culture in Progress for 7 days     Performed at [facility]   Report Status PENDING   Incomplete  WOUND CULTURE     Status: None   Collection Time    11/30/13 12:25 PM      Result Value Ref Range Status   Specimen Description ORAL TONGUE ULCER   Final   Special Requests Immunocompromised   Final   Gram Stain     Final   Value: NO WBC SEEN     RARE SQUAMOUS EPITHELIAL CELLS PRESENT     MODERATE GRAM POSITIVE RODS     Performed at [facility]   Culture     Final   Value: MULTIPLE ORGANISMS PRESENT, NONE PREDOMINANT     Note: NO STAPHYLOCOCCUS AUREUS ISOLATED NO GROUP A STREP (S.PYOGENES) ISOLATED     Performed at [facility]   Report Status 12/02/2013 FINAL   Final  CULTURE, BLOOD (ROUTINE X 2)     Status: None   Collection Time    12/01/13  8:12 AM      Result Value Ref Range Status   Specimen Description BLOOD LEFT HAND   Final   Special Requests BOTTLES DRAWN AEROBIC AND ANAEROBIC 6 MLS EACH   Final   Culture  Setup Time  Final   Value: 12/01/2013 10:37     Performed at [facility]   Culture     Final   Value:        BLOOD CULTURE RECEIVED NO GROWTH  TO DATE CULTURE WILL BE HELD FOR 5 DAYS BEFORE ISSUING A FINAL NEGATIVE REPORT     Performed at [facility]   Report Status PENDING   Incomplete  CULTURE, BLOOD (ROUTINE X 2)     Status: None   Collection Time    12/01/13  8:15 AM      Result Value Ref Range Status   Specimen Description BLOOD LEFT HAND   Final   Special Requests BOTTLES DRAWN AEROBIC AND ANAEROBIC 3 MLS EACH   Final   Culture  Setup Time     Final   Value: 12/01/2013 10:37     Performed at [facility]   Culture     Final   Value:        BLOOD CULTURE RECEIVED NO GROWTH TO DATE CULTURE WILL BE HELD FOR 5 DAYS BEFORE ISSUING A FINAL NEGATIVE REPORT     Performed at [facility]   Report Status PENDING   Incomplete  URINE CULTURE     Status: None   Collection Time    12/01/13 11:40 AM      Result Value Ref Range Status   Specimen Description URINE, CATHETERIZED   Final   Special Requests NONE   Final   Culture  Setup Time     Final   Value: 12/01/2013 14:31     Performed at [facility]     Final   Value: NO GROWTH     Performed at [facility]   Culture     Final   Value: NO GROWTH     Performed at [facility]   Report Status 12/02/2013 FINAL   Final  CULTURE, EXPECTORATED SPUTUM-ASSESSMENT     Status: None   Collection Time    12/03/13  3:47 AM      Result Value Ref Range Status   Specimen Description SPUTUM   Final   Special Requests Immunocompromised   Final   Sputum evaluation     Final   Value: MICROSCOPIC FINDINGS SUGGEST THAT THIS SPECIMEN IS NOT REPRESENTATIVE OF LOWER RESPIRATORY SECRETIONS. PLEASE RECOLLECT.     RESULTS CALLED TO, READ BACK BY AND VERIFIED WITH PAT MICKLEY,RN [id number] @ [phone] BY J SCOTTON   Report Status 12/03/2013 FINAL   Final     Studies: [place] Chest  12/03/2013   CLINICAL DATA:  Right pleural effusion and possible thoracentesis.  EXAM: CHEST ULTRASOUND  COMPARISON:  CT ABD/PELV WO CM dated 12/02/2013; DG CHEST 1 VIEW  dated 12/02/2013; CT CHEST W/O CM dated 12/01/2013  FINDINGS: By ultrasound, the right pleural effusion is now fairly small in size and is complex and loculated. Thoracentesis was not performed today.  IMPRESSION: Right pleural fusion is fairly small in volume and both loculated and complex appearing by ultrasound. Thoracentesis was not performed today.   Electronically Signed   By: Aletta Edouard M.D.   On: 12/03/2013 10:48   Dg Chest Port 1 View  12/05/2013   CLINICAL DATA:  Airspace disease  EXAM: PORTABLE CHEST - 1 VIEW  COMPARISON:  Prior chest x-ray 12/02/2013, chest CT 12/01/2013  FINDINGS: Decreasing volume of the right pleural effusion. There is persistent loculated fluid within the minor fissure. Stable cardiac  and mediastinal contours. Left upper extremity approach PICC with the catheter projecting over the superior cavoatrial junction. No pneumothorax. Left basilar atelectasis versus infiltrate is unchanged. Mild vascular congestion without overt edema. Chronic right upper lobe collapse resulting in elevation of the minor fissure.  IMPRESSION: Decreasing right pleural effusion with improving aeration of the right lower and middle lobes.  Persistent pulmonary vascular congestion without overt edema, left basilar atelectasis and chronic atelectasis/scarring in the right upper lobe.   Electronically Signed   By: Jacqulynn Cadet M.D.   On: 12/05/2013 07:52    Scheduled Meds: . acyclovir  5 mg/kg (Ideal) Intravenous Q8H  . ceFEPime (MAXIPIME) IV  1 g Intravenous Q12H  . DULoxetine  60 mg Oral QPM  . fluticasone  2 spray Each Nare Daily  . furosemide  60 mg Intravenous BID  . hydrocortisone sodium succinate  100 mg Intravenous Q8H  . linezolid  600 mg Intravenous Q12H  . micafungin (MYCAMINE) IV  100 mg Intravenous Q24H  . pantoprazole (PROTONIX) IV  40 mg Intravenous Q24H  . potassium chloride  40 mEq Oral BID  . sodium chloride  10-40 mL Intracatheter Q12H  . sodium chloride  3 mL  Intravenous Q12H   Continuous Infusions:   Active Problems:   Myelodysplasia   Symptomatic anemia   Adrenal infarction   Acute renal failure   Acute on chronic diastolic heart failure   Altered mental status   Acute-on-chronic respiratory failure   Hypotension, unspecified   Severe sepsis   DVT (deep venous thrombosis)   Pleural effusion   Thrombocytopenia    Time spent: >35 minutes     Kinnie Feil  [facility] Pager [phone]. If 7PM-7AM, please contact night-coverage at [email or web address], password [facility] 12/05/2013, 8:33 AM  LOS: 28 days

## 2013-12-05 NOTE — Progress Notes (Addendum)
Lacey [name] looks really good. This is probably the best I have seen her look in 3 weeks. The edema is gone:Marland Kitchen Her right arm looks much better. The biopsy of her tongue ulcer was negative.  She does not have as much pain in the left hip. Hopefully, she can get out of bed more and even tried to walk She will need a lot of physical therapy.  Again, all cultures have been negative.  She's had no bleeding. She is still a little congested. Chest x-ray was done today and is pending.  She's had no diarrhea.  Her blood counts actually have stabilized a little bit. Her white cell count 5.5. Hemoglobin 8.9. Platelet count 35. He is actually are a little improved. I have to believe this also is encouraging.  Her renal function is a little better. LDH continues to come down.  Her vital signs show she is afebrile. Temperature 98 Blood pressure 139/86. Heart rate is 98. Her lungs sound a little wheezy. She does have good air movement bilaterally. Cardiac exam regular rate and rhythm. No murmurs. Abdomen soft. Not as distended. Not as much tenderness in the left lower quadrant. Extremities shows no edema. Right arm has markedly decreased edema. Skin exam no rashes. Neurological exam no focal neurological deficits.  I suspect that she would be able to be moved out of the ICU today. I probably would keep her on antibiotics for right now. Something clearly is working. Again all cultures are negative but she is improving.  We will go ahead and start treatment on her. I will use Vidaza. We will use this subcutaneously. This would be daily for 7 days. I will sort out the dose.  I very much appreciate the outstanding care that she has received from all the staff in the ICU at him all the specialists involved.   Pete E.  Pslams 27:1  ADDENDUM:  I will use 5-Azacytidine for therapy. This will be sq administration daily for 7 days.  I do not anticipate many side effects from this.    I f she can get stronger,  then we can use a more aggressive regimen. I do not think that she will tolerate anything that will "wipe out" her bone marrow.

## 2013-12-05 NOTE — Progress Notes (Signed)
Physical therapy note-PT was discontinued on 3/7. If MD would like PT  to continue, please reorder. Thank you. Tresa Endo [phone]

## 2013-12-05 NOTE — Progress Notes (Signed)
IP Rehab  Notes reviewed.  Pt. Has not been able to participate in most recent attempts at OT, and note PT has signed off as of 12/02/13. It is unlikely that she would be able to participate in intensive rehab so  I will sign off due to her current medical status.   I have notified Sindy Messing with SW.  Please call if questions.  [place] Admissions Coordinator Cell [phone] Office [phone]

## 2013-12-05 NOTE — Progress Notes (Signed)
Chemotherapy related progress note  Acknowledged chemotherapy orders placed 12/05/13 at 1:52pm Noted patient has not had chemotherapy teaching from inpatient oncology educator Noted patient has not had inpatient chemotherapy consent completed Patient currently in the ICU, to transfer to 1412 sometime this afternoon  Will await signing of chemotherapy consent form prior to initiating communication with nursing staff to get chemotherapy nurse to administer medication since this can not be done by the staff on 4E  Please contact pharmacy when consent is signed so that we can release chemotherapy orders and prepare medication.  Medication has a 1 hour expiration date once prepared, so we will wait until the above has been completed prior to preparation.  Johny Drilling, PharmD, BCPS Pager: [phone] Pharmacy: [phone] 12/05/2013 2:53 PM

## 2013-12-05 NOTE — Progress Notes (Signed)
[place], RN, BSN, CCM [phone] Chart Reviewed for discharge and hospital needs. Discharge needs at time of review:  None present will follow for needs. Review of patient progress due on [id number]. Patient scheduled to be transferred from icu/sdu/ to med/surg floor/[id number]/ first day this R.N. has been in house to review since last review on [id number]/  Patient is slowly progressing.

## 2013-12-05 NOTE — Progress Notes (Signed)
OT Cancellation Note  Patient Details Name: Lacey [name] MRN: [id number] DOB: 12/11/1955   Cancelled Treatment:    Reason Eval/Treat Not Completed: Other (comment).  Checked with pt this am and shortly after noon.  She is too fatiqued to try any eob/oob activities with OT.    Naydeen Speirs 12/05/2013, 2:25 PM [name], OTR/L [phone] 12/05/2013

## 2013-12-06 DIAGNOSIS — S2020XA Contusion of thorax, unspecified, initial encounter: Secondary | ICD-10-CM

## 2013-12-06 DIAGNOSIS — R0989 Other specified symptoms and signs involving the circulatory and respiratory systems: Secondary | ICD-10-CM

## 2013-12-06 LAB — CBC
HCT: 26 % — ABNORMAL LOW (ref 36.0–46.0)
Hemoglobin: 8.5 g/dL — ABNORMAL LOW (ref 12.0–15.0)
MCH: 28.6 pg (ref 26.0–34.0)
MCHC: 32.7 g/dL (ref 30.0–36.0)
MCV: 87.5 fL (ref 78.0–100.0)
PLATELETS: 23 10*3/uL — AB (ref 150–400)
RBC: 2.97 MIL/uL — ABNORMAL LOW (ref 3.87–5.11)
RDW: 19.8 % — AB (ref 11.5–15.5)
WBC: 4.1 10*3/uL (ref 4.0–10.5)

## 2013-12-06 LAB — BASIC METABOLIC PANEL
BUN: 25 mg/dL — ABNORMAL HIGH (ref 6–23)
CALCIUM: 7.9 mg/dL — AB (ref 8.4–10.5)
CO2: 33 mEq/L — ABNORMAL HIGH (ref 19–32)
CREATININE: 1.21 mg/dL — AB (ref 0.50–1.10)
Chloride: 99 mEq/L (ref 96–112)
GFR calc non Af Amer: 48 mL/min — ABNORMAL LOW (ref >90)
GFR, EST AFRICAN AMERICAN: 56 mL/min — AB (ref >90)
Glucose, Bld: 130 mg/dL — ABNORMAL HIGH (ref 70–99)
Potassium: 2.9 mEq/L — CL (ref 3.7–5.3)
Sodium: 143 mEq/L (ref 137–147)

## 2013-12-06 LAB — CLOSTRIDIUM DIFFICILE BY PCR: Toxigenic C. Difficile by PCR: NEGATIVE

## 2013-12-06 LAB — LACTATE DEHYDROGENASE: LDH: 458 U/L — AB (ref 94–250)

## 2013-12-06 MED ORDER — ONDANSETRON HCL 4 MG/2ML IJ SOLN
4.0000 mg | Freq: Four times a day (QID) | INTRAMUSCULAR | Status: DC | PRN
  Administered 2013-12-06 – 2013-12-10 (×4): 4 mg via INTRAVENOUS
  Filled 2013-12-06 (×5): qty 2

## 2013-12-06 MED ORDER — POTASSIUM CHLORIDE 20 MEQ/15ML (10%) PO LIQD
40.0000 meq | Freq: Once | ORAL | Status: AC
  Administered 2013-12-06: 40 meq via ORAL
  Filled 2013-12-06: qty 30

## 2013-12-06 MED ORDER — HYDROCORTISONE SOD SUCCINATE 100 MG IJ SOLR
50.0000 mg | Freq: Two times a day (BID) | INTRAMUSCULAR | Status: DC
  Filled 2013-12-06 (×2): qty 1

## 2013-12-06 MED ORDER — POTASSIUM CHLORIDE CRYS ER 20 MEQ PO TBCR
20.0000 meq | EXTENDED_RELEASE_TABLET | Freq: Once | ORAL | Status: DC
  Filled 2013-12-06: qty 1

## 2013-12-06 MED ORDER — POTASSIUM CHLORIDE 10 MEQ/50ML IV SOLN
10.0000 meq | INTRAVENOUS | Status: AC
  Administered 2013-12-06 (×5): 10 meq via INTRAVENOUS
  Filled 2013-12-06 (×6): qty 50

## 2013-12-06 MED ORDER — ONDANSETRON HCL 8 MG PO TABS
8.0000 mg | ORAL_TABLET | Freq: Once | ORAL | Status: AC
  Administered 2013-12-07: 8 mg via ORAL
  Filled 2013-12-06: qty 2
  Filled 2013-12-06: qty 1

## 2013-12-06 MED ORDER — ONDANSETRON HCL 4 MG/2ML IJ SOLN
4.0000 mg | Freq: Once | INTRAMUSCULAR | Status: AC
  Administered 2013-12-06: 4 mg via INTRAVENOUS

## 2013-12-06 MED ORDER — AZACITIDINE CHEMO SQ INJECTION
75.0000 mg/m2 | Freq: Once | INTRAMUSCULAR | Status: AC
  Administered 2013-12-06: 145 mg via SUBCUTANEOUS
  Filled 2013-12-06: qty 5.8

## 2013-12-06 NOTE — Progress Notes (Signed)
ANTIBIOTIC CONSULT NOTE - Follow Up  Pharmacy Consult for Zyvox/Micafungin/Cefepime  Indication: HCAP  Allergies  Allergen Reactions  . Penicillins Hives and Rash  . Contrast Media [Iodinated Diagnostic Agents]     Patient Measurements: Height: 5' (152.4 cm) Weight: 191 lb 12.8 oz (87 kg) IBW/kg (Calculated) : 45.5 Adjusted Body Weight: n/a  Vital Signs: Temp: 98.4 F (36.9 C) (03/11 0425) Temp src: Oral (03/11 0425) BP: 133/75 mmHg (03/11 0425) Pulse Rate: 91 (03/11 0425) Intake/Output from previous day: 03/10 0701 - 03/11 0700 In: 1159.2 [P.O.:300; I.V.:300; IV Piggyback:559.2] Out: 4150 [Urine:4150] Intake/Output from this shift: Total I/O In: 120 [P.O.:120] Out: -   Labs:  Recent Labs  12/04/13 0415 12/04/13 2005 12/05/13 0415 12/06/13 0440  WBC 8.3  --  5.5 4.1  HGB 8.7*  --  8.9* 8.5*  PLT 32*  --  35* 23*  CREATININE 1.50* 1.53* 1.49* 1.21*   Estimated Creatinine Clearance: 49.1 ml/min (by C-G formula based on Cr of 1.21). No results found for this basename: VANCOTROUGH, Leodis Binet, VANCORANDOM, GENTTROUGH, GENTPEAK, GENTRANDOM, TOBRATROUGH, TOBRAPEAK, TOBRARND, AMIKACINPEAK, AMIKACINTROU, AMIKACIN,  in the last 72 hours   Microbiology: Recent Results (from the past 720 hour(s))  CULTURE, BLOOD (ROUTINE X 2)     Status: None   Collection Time    11/08/13 12:40 AM      Result Value Ref Range Status   Specimen Description BLOOD LEFT ANTECUBITAL   Final   Special Requests BOTTLES DRAWN AEROBIC AND ANAEROBIC 3CC   Final   Culture  Setup Time     Final   Value: 11/08/2013 03:32     Performed at [facility]   Culture     Final   Value: NO GROWTH 5 DAYS     Performed at [facility]   Report Status 11/14/2013 FINAL   Final  CULTURE, BLOOD (ROUTINE X 2)     Status: None   Collection Time    11/08/13 12:45 AM      Result Value Ref Range Status   Specimen Description BLOOD LEFT HAND   Final   Special Requests BOTTLES DRAWN AEROBIC AND  ANAEROBIC 3CC   Final   Culture  Setup Time     Final   Value: 11/08/2013 03:31     Performed at [facility]   Culture     Final   Value: NO GROWTH 5 DAYS     Performed at [facility]   Report Status 11/14/2013 FINAL   Final  RESPIRATORY VIRUS PANEL     Status: None   Collection Time    11/11/13 12:00 AM      Result Value Ref Range Status   Source - RVPAN NASOPHARYNGEAL   Final   Respiratory Syncytial Virus A NOT DETECTED   Final   Respiratory Syncytial Virus B NOT DETECTED   Final   Influenza A NOT DETECTED   Final   Influenza B NOT DETECTED   Final   Parainfluenza 1 NOT DETECTED   Final   Parainfluenza 2 NOT DETECTED   Final   Parainfluenza 3 NOT DETECTED   Final   Metapneumovirus NOT DETECTED   Final   Rhinovirus NOT DETECTED   Final   Adenovirus NOT DETECTED   Final   Influenza A H1 NOT DETECTED   Final   Influenza A H3 NOT DETECTED   Final   Comment: (NOTE)           Normal Reference Range for  each Analyte: NOT DETECTED     Testing performed using the Luminex xTAG Respiratory Viral Panel test     kit.     This test was developed and its performance characteristics determined     by [facility]. It has not been cleared or approved by the [place]     Food and Drug Administration. This test is used for clinical purposes.     It should not be regarded as investigational or for research. This     laboratory is certified under the Clinical Laboratory Improvement     Amendments of 1988 (CLIA) as qualified to perform high complexity     clinical laboratory testing.     Performed at [facility]  CULTURE, BLOOD (ROUTINE X 2)     Status: None   Collection Time    11/13/13  6:20 AM      Result Value Ref Range Status   Specimen Description BLOOD LEFT HAND   Final   Special Requests     Final   Value: BOTTLES DRAWN AEROBIC AND ANAEROBIC 7CC AER 3CC ANA   Culture  Setup Time     Final   Value: 11/13/2013 08:35     Performed at [facility]    Culture     Final   Value: NO GROWTH 5 DAYS     Performed at [facility]   Report Status 11/19/2013 FINAL   Final  CULTURE, BLOOD (ROUTINE X 2)     Status: None   Collection Time    11/13/13  6:35 AM      Result Value Ref Range Status   Specimen Description BLOOD LEFT HAND   Final   Special Requests     Final   Value: BOTTLES DRAWN AEROBIC AND ANAEROBIC 7CC AER 3CC ANA   Culture  Setup Time     Final   Value: 11/13/2013 08:35     Performed at [facility]   Culture     Final   Value: NO GROWTH 5 DAYS     Performed at [facility]   Report Status 11/19/2013 FINAL   Final  MRSA PCR SCREENING     Status: None   Collection Time    11/13/13  6:04 PM      Result Value Ref Range Status   MRSA by PCR NEGATIVE  NEGATIVE Final   Comment:            The GeneXpert MRSA Assay (FDA     approved for NASAL specimens     only), is one component of a     comprehensive MRSA colonization     surveillance program. It is not     intended to diagnose MRSA     infection nor to guide or     monitor treatment for     MRSA infections.  URINE CULTURE     Status: None   Collection Time    11/13/13  8:55 PM      Result Value Ref Range Status   Specimen Description URINE, CATHETERIZED   Final   Special Requests NONE   Final   Culture  Setup Time     Final   Value: 11/14/2013 01:07     Performed at [facility] Count     Final   Value: NO GROWTH     Performed at [facility]   Culture     Final   Value:  NO GROWTH     Performed at [facility]   Report Status 11/14/2013 FINAL   Final  CULTURE, EXPECTORATED SPUTUM-ASSESSMENT     Status: None   Collection Time    11/14/13  4:36 PM      Result Value Ref Range Status   Specimen Description SPUTUM   Final   Special Requests Immunocompromised   Final   Sputum evaluation     Final   Value: THIS SPECIMEN IS ACCEPTABLE. RESPIRATORY CULTURE REPORT TO FOLLOW.   Report Status 11/14/2013 FINAL    Final  CULTURE, RESPIRATORY (NON-EXPECTORATED)     Status: None   Collection Time    11/14/13  4:36 PM      Result Value Ref Range Status   Specimen Description SPUTUM   Final   Special Requests NONE   Final   Gram Stain     Final   Value: RARE WBC PRESENT, PREDOMINANTLY PMN     RARE SQUAMOUS EPITHELIAL CELLS PRESENT     NO ORGANISMS SEEN     Performed at [facility]   Culture     Final   Value: NORMAL OROPHARYNGEAL FLORA     Performed at [facility]   Report Status 11/17/2013 FINAL   Final  HERPES SIMPLEX VIRUS CULTURE     Status: None   Collection Time    11/14/13  6:49 PM      Result Value Ref Range Status   Specimen Description MOUTH   Final   Special Requests Immunocompromised   Final   Culture     Final   Value: No Herpes Simplex Virus detected.     Performed at [facility]   Report Status 11/16/2013 FINAL   Final  VIRAL CULTURE VIRC     Status: None   Collection Time    11/22/13 11:46 AM      Result Value Ref Range Status   Specimen Description ULCER MOUTH   Final   Special Requests Immunocompromised   Final   Culture     Final   Value: No Virus Isolated in Cell Culture                                                                A negative result does not exclude the possibility of virus infection;inappropriate specimen collection,storage and transport may lead to false negative      culture results.     Note:    The current Enterovirus culture system does not detect Enterovirus D68. If Enterovirus D68 is suspected, please call client services to add the test for Enterovirus PCR (Test code [phone]).     Performed at [facility]   Report Status 12/04/2013 FINAL   Final  CLOSTRIDIUM DIFFICILE BY PCR     Status: None   Collection Time    11/22/13  7:34 PM      Result Value Ref Range Status   C difficile by pcr NEGATIVE  NEGATIVE Final   Comment: Performed at [place], BLOOD (SINGLE)     Status: None   Collection  Time    11/22/13 11:30 PM      Result Value Ref Range Status   Specimen Description BLOOD PIC LINE  Final   Special Requests BOTTLES DRAWN AEROBIC AND ANAEROBIC   Final   Culture  Setup Time     Final   Value: 11/22/2013 04:04     Performed at [facility]   Culture     Final   Value: NO GROWTH 5 DAYS     Performed at [facility]   Report Status 11/29/2013 FINAL   Final  AFB CULTURE WITH SMEAR     Status: None   Collection Time    11/16/2013  2:54 PM      Result Value Ref Range Status   Specimen Description BRONCHIAL ALVEOLAR LAVAGE   Final   Special Requests Immunocompromised   Final   ACID FAST SMEAR     Final   Value: NO ACID FAST BACILLI SEEN     Performed at [facility]   Culture     Final   Value: CULTURE WILL BE EXAMINED FOR 6 WEEKS BEFORE ISSUING A FINAL REPORT     Performed at [facility]   Report Status PENDING   Incomplete  CULTURE, BAL-QUANTITATIVE     Status: None   Collection Time    11/05/2013  2:54 PM      Result Value Ref Range Status   Specimen Description BRONCHIAL ALVEOLAR LAVAGE   Final   Special Requests Immunocompromised   Final   Gram Stain     Final   Value: FEW WBC PRESENT, PREDOMINANTLY PMN     NO SQUAMOUS EPITHELIAL CELLS SEEN     NO ORGANISMS SEEN     Performed at [place]     Final   Value: NO GROWTH     Performed at [facility]   Culture     Final   Value: NO GROWTH 2 DAYS     Performed at [facility]   Report Status 11/26/2013 FINAL   Final  FUNGUS CULTURE W SMEAR     Status: None   Collection Time    11/09/2013  2:54 PM      Result Value Ref Range Status   Specimen Description BRONCHIAL WASHINGS   Final   Special Requests Immunocompromised   Final   Fungal Smear     Final   Value: NO YEAST OR FUNGAL ELEMENTS SEEN     Performed at [facility]   Culture     Final   Value: CULTURE IN PROGRESS FOR FOUR WEEKS     Performed at [facility]    Report Status PENDING   Incomplete  PNEUMOCYSTIS JIROVECI SMEAR BY DFA     Status: None   Collection Time    11/08/2013  2:54 PM      Result Value Ref Range Status   Specimen Source-PJSRC BRONCHIAL ALVEOLAR LAVAGE   Final   Pneumocystis jiroveci Ag NEGATIVE   Final   Comment: Performed at [facility]  LEGIONELLA CULTURE     Status: None   Collection Time    11/20/2013  2:54 PM      Result Value Ref Range Status   Specimen Description BRONCHIAL WASHINGS   Final   Special Requests Immunocompromised   Final   Culture     Final   Value: NO LEGIONELLA ISOLATED     Performed at [facility]   Report Status 11/29/2013 FINAL   Final  CMV CULTURE CMVC     Status: None   Collection  Time    11/30/13 12:24 PM      Result Value Ref Range Status   Specimen Description MOUTH TONGUE ULCER   Final   Special Requests Immunocompromised   Final   Culture     Final   Value: No Cytomegalovirus identified in cell culture                                                                A negative result does not exclude the possibility of CMV infection;inappropriate specimen collection,storage and transport may lead to false      negative results.     Performed at [facility]   Report Status 12/04/2013 FINAL   Final  FUNGUS CULTURE W SMEAR     Status: None   Collection Time    11/30/13 12:25 PM      Result Value Ref Range Status   Specimen Description ORAL TONGUE ULCER   Final   Special Requests Immunocompromised   Final   Fungal Smear     Final   Value: NO YEAST OR FUNGAL ELEMENTS SEEN     Performed at [facility]   Culture     Final   Value: Culture in Progress for 7 days     Performed at [facility]   Report Status PENDING   Incomplete  WOUND CULTURE     Status: None   Collection Time    11/30/13 12:25 PM      Result Value Ref Range Status   Specimen Description ORAL TONGUE ULCER   Final   Special Requests Immunocompromised   Final   Gram Stain      Final   Value: NO WBC SEEN     RARE SQUAMOUS EPITHELIAL CELLS PRESENT     MODERATE GRAM POSITIVE RODS     Performed at [facility]   Culture     Final   Value: MULTIPLE ORGANISMS PRESENT, NONE PREDOMINANT     Note: NO STAPHYLOCOCCUS AUREUS ISOLATED NO GROUP A STREP (S.PYOGENES) ISOLATED     Performed at [facility]   Report Status 12/02/2013 FINAL   Final  CULTURE, BLOOD (ROUTINE X 2)     Status: None   Collection Time    12/01/13  8:12 AM      Result Value Ref Range Status   Specimen Description BLOOD LEFT HAND   Final   Special Requests BOTTLES DRAWN AEROBIC AND ANAEROBIC 6 MLS EACH   Final   Culture  Setup Time     Final   Value: 12/01/2013 10:37     Performed at [facility]   Culture     Final   Value:        BLOOD CULTURE RECEIVED NO GROWTH TO DATE CULTURE WILL BE HELD FOR 5 DAYS BEFORE ISSUING A FINAL NEGATIVE REPORT     Performed at [facility]   Report Status PENDING   Incomplete  CULTURE, BLOOD (ROUTINE X 2)     Status: None   Collection Time    12/01/13  8:15 AM      Result Value Ref Range Status   Specimen Description BLOOD LEFT HAND   Final   Special Requests BOTTLES DRAWN AEROBIC AND ANAEROBIC 3 MLS EACH  Final   Culture  Setup Time     Final   Value: 12/01/2013 10:37     Performed at [facility]   Culture     Final   Value:        BLOOD CULTURE RECEIVED NO GROWTH TO DATE CULTURE WILL BE HELD FOR 5 DAYS BEFORE ISSUING A FINAL NEGATIVE REPORT     Performed at [facility]   Report Status PENDING   Incomplete  URINE CULTURE     Status: None   Collection Time    12/01/13 11:40 AM      Result Value Ref Range Status   Specimen Description URINE, CATHETERIZED   Final   Special Requests NONE   Final   Culture  Setup Time     Final   Value: 12/01/2013 14:31     Performed at [place]     Final   Value: NO GROWTH     Performed at [facility]   Culture     Final   Value: NO  GROWTH     Performed at [facility]   Report Status 12/02/2013 FINAL   Final  CULTURE, EXPECTORATED SPUTUM-ASSESSMENT     Status: None   Collection Time    12/03/13  3:47 AM      Result Value Ref Range Status   Specimen Description SPUTUM   Final   Special Requests Immunocompromised   Final   Sputum evaluation     Final   Value: MICROSCOPIC FINDINGS SUGGEST THAT THIS SPECIMEN IS NOT REPRESENTATIVE OF LOWER RESPIRATORY SECRETIONS. PLEASE RECOLLECT.     RESULTS CALLED TO, READ BACK BY AND VERIFIED WITH PAT MICKLEY,RN [id number] @ 1275 BY J SCOTTON   Report Status 12/03/2013 FINAL   Final    Medical History: Past Medical History  Diagnosis Date  . Cancer   . Cough productive of clear sputum 01/04/2013  . Fibromyalgia     Medications:  Prescriptions prior to admission  Medication Sig Dispense Refill  . cholecalciferol (VITAMIN D) 1000 UNITS tablet Take 1,000 Units by mouth daily.      . DULoxetine (CYMBALTA) 60 MG capsule Take 60 mg by mouth every evening.      . gabapentin (NEURONTIN) 300 MG capsule Take 600 mg by mouth 2 (two) times daily.       Nyoka Cowden Tea 250 MG CAPS Take by mouth every morning.      Marland Kitchen amoxicillin (AMOXIL) 500 MG capsule Take 500 mg by mouth 3 (three) times daily. For 7 days.       Assessment: 9 YOM with progressive HCAP. He was on Vancomycin/Cefepime/Fluconazole and is now being changed to Zyvox/Zosyn/Micafungin on 3/7 Pt has documented allergy to penicillin. Will f/u with MD to consider leaving Cefepime on-board or change to a carbapenem - Cefepime was continued.   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

## 2013-12-06 NOTE — Progress Notes (Signed)
CRITICAL VALUE ALERT  Critical value received:  K+ 2.9 Platelet 23  Date of notification:  12/06/13  Time of notification:  2979  Critical value read back:yes  Nurse who received alert:  J.Latrisha Coiro  MD notified (1st page):  K.Schorr  Time of first page:  0545  MD notified (2nd page):   Time of second page:  Responding MD:  K.Schorr  Time MD responded:  [phone]

## 2013-12-06 NOTE — Progress Notes (Signed)
[name] looks okay this morning. She now is up on 4 E. She is doing okay. She still has some congestion. She's been afebrile. She still is on IV antibiotics. Her tongue looks a little better with his ulcer. I still am not sure at all as to what causes ulcer. I don't know if she needs to be on antibiotics. It seems that what we get her off antibiotics, she seems to start her temperature spikes. Hopefully with the [facility] line out, but this will not happen.  She's not yet started the [facility]. Hopefully she'll start that today.  She really needs to try to get some physical therapy. I realize this may be difficult. The left pelvic hematoma is slowly resolving.  I don't see much edema with her now.  Her labs all look in fairly stable. Her potassium was a little on the low side. She will get replacement potassium. Her him hemoglobin is 8.5. Platelet count is 23. She's not bleeding. White cell count has now come down to 4.1.  I really hoping that have in the [facility] line out is the intervention that will stop her fevers and her episodes of "sepsis". Her blood pressure is fine. Heart rate still a little tachycardic.  A chest x-ray today was not done.  Her lungs still sounds with some congestion.  Hopefully, we can now start focusing on her performance status and try to improve her status.  Lacey E.

## 2013-12-06 NOTE — Progress Notes (Signed)
CSW appreciates notes from previous CSW. Patient transferred to [facility] will follow for disposition as it appears unclear at this time.  Genie Wenke C. Hull MSW, [place]

## 2013-12-06 NOTE — Progress Notes (Signed)
Resumed care of patient.  No change in patient assessment.

## 2013-12-06 NOTE — Progress Notes (Signed)
[facility] PROGRESS NOTE  Lacey [name] XBL:[id number] DOB: October 03, 1955 DOA: 11/22/2013 PCP: Vena Austria, MD  Brief summary  58 y.o. female with history of Hodgkin's lymphoma status post chemotherapy now with myelodysplastic syndrome who initially presented with abdominal pain, nausea, vomiting, hypotension and complicated with adrenal infarct, adrenal insufficiency, HCAP, recurrent sepsis, hypotension RNF<-->ICU/pressure support+IV atx;  -She also developed hemorrhage into her left iliopsoas muscle which caused weakness and pain of her left leg and LLQ. -Dr. Marin Olp is determining what treatment to initiate for her MDS as she has required frequent transfusions.   Assessment/Plan  1. Myelodysplastic syndrome in setting of Hodgkin Lymphoma:  -Refractory anemia and thrombocytopenia requiring almost daily blood product transfusions, Bone Marrow Biopsy: HYPERCELLULAR BONE MARROW FOR AGE WITH DYSPOIETIC CHANGES. -myelodysplastic/myeloproliferative process, particularly chronic myelomonocytic leukemia - Appreciate Dr Jonette Eva assistance. on frequent daily transfusions (PRBC+platelets) as patient is developing antibodies. Per hem/onc to start [place] on 3/11  2. Recurrent fever/septic shock, pneumonia Likely due dysfunction leukocytes as above  -CT chest on 2/23: Persistent consolidation RML with new peribronchovascular opacities suggestive of bronchopneumonia. Underwent bronch on 2/26; BCx NGTD,  Diff PCR neg, BAL: PCP neg, Bacterial, fungal, and AFB cultures NGTD  - 3/6: febrile; CT: Persistent right upper lobe collapse/ consolidation with interval development of central airspace disease in the left upper lobe  - 3/6: restarted IV atx, antifungals, acyclovir  -off pressors; clinically improving, started taper IV steroids 3/10  -3/11: PICC line is out. Will stop all antibiotics and antifungals as all cx data has been negative to date. Hopefully fevers and "sepsis" will not  recur.  3. Acute on chronic diastolic heart failure due to IVF from resuscitation. Echo (2/15): Preserved EF and grade 1 diastolic heart failure -R sided pleural effusion; anasarca, cont IV lasix; Daily weights and strict I/O; monitor renal function -per pulmonology deferred thoracentesis due to thrombocytopenia, R sided pleural effusion    4. 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 -Will decrease hydrocortisone from 50 mg IV q8 to q12, with hope of transitioning to PO over the next couple days.    5. Acute Renal Failure due to septic shock; RUS wnl. Hypervolemic  - resumed lasix due to respiratory status; close monitor   6. Spontaneous retroperitoneal bleed in setting of thrombocytopenia; hemorrhage into her left iliopsoas muscle which caused weakness and pain of her left leg and LLQ - on fentanyl; cont pain control  -Resume PT/OT.  7. Right upper extremity superficial thrombosis, stable   8. Transaminitis due to septic shock, resolved.   9. Urge and stress incontinence; Foley placed for comfort   10. Tongue ulcer; cont as above; biopsied by ENT; path: SQUAMOUS EPITHELIUM. NO DYSPLASIA OR MALIGNANCY -improving     Proph: Hold for thrombocytopenia, spont retroperitoneal bleed   Code Status: full  Family Communication: patient alone,  Disposition Plan: Clinically improving. PT/OT recommending CIR vs. SNF.   Consultants:  Dr. Burney Gauze (oncology)  Dr Gibson Ramp (Surgery) curbside consult  Dr. Carlyle Basques (infectious disease)    Procedures:  11/12/2013 right upper extremity Doppler  No evidence of deep vein thrombosis involving the right upper extremity, left subclavian vein, and left internal jugular vein. There is superficial thrombosis noted in the right cephalic vein.  Old PICC removed 3/5; Left-sided PICC 03/06 >>>  Foley 3/2 >>>   -2/12 Bone marrow biopsy report pending  Echocardiogram  01/10/2013  - Left ventricle: mild focal basal hypertrophy of the septum.  -  LVEF= ejection fraction was 55%. Wall motion was normal; there were no regional wall motion abnormalities.  -(grade 1 diastolic dysfunction).  - Aortic valve: There was no stenosis. Trivial regurgitation. - Mitral valve: Trivial regurgitation. - Left atrium: The atrium was mildly dilated. - Right ventricle: The cavity size was normal. Systolic function was normal. - Tricuspid valve: Peak RV-RA gradient: 37m Hg (S). - Pulmonary arteries: PA peak pressure: 233mHg (S). - Inferior vena cava: The vessel was normal in size; the respirophasic diameter changes were in the normal range (=50%); findings are consistent with normal central venous pressure.  Antibiotics:  Previous Courses of Imipenem, Vancomycin, Fluconazole  Acyclovir 3/5 >>>  Cefepime 3/7 >>>  Micafungin 3/7 >>>  Zyvox 3/7 >>>   2/15 Upper extremity Dopplers demonstrated acute clot of the right cephalic vein  2/6/25hest CT demonstrated loculated right pleural effusion which does not appear to have intact  2/17 Echocardiogram demonstrates EF of 5563-89%ith diastolic dysfunction.  2/24 Abdominal CT demonstrated hemorrhage in the left iliacus muscle  2/23 Chest CT demonstrated persistent right pleural effusion and right middle lobe density  2/25 Head CT demonstrated sphenoid sinusitis  2/26 Bronchoscopy did not reveal an infectious organism  3/3 Lower extremity Dopplers did not detect DVTs  3/6 Chest CT> persistent collapse of the RML, RLL with a large pleural effusion. Development of a new left airspace opacity.  3/8 TTE >>> Ef 65%, no RWMA, PAP 35 torr  3/8 Abdomen CT >>> No findings consistent with sepsis, evolution of L iliac hematoma  3/8 [place] Effusion>>>small in volume, loculated & complex appearing by [place] Consultants:  Oncology  HPI/Subjective: alert  Objective: Filed Vitals:   12/06/13 1312  BP: 144/65  Pulse: 107  Temp:  98.9 F (37.2 C)  Resp: 16    Intake/Output Summary (Last 24 hours) at 12/06/13 1718 Last data filed at 12/06/13 1400  Gross per 24 hour  Intake 1853.8 ml  Output   4300 ml  Net -2446.2 ml   Filed Weights   12/05/13 0419 12/05/13 1515 12/06/13 0425  Weight: 87 kg (191 lb 12.8 oz) 87.4 kg (192 lb 10.9 oz) 87 kg (191 lb 12.8 oz)    Exam:   General:  alert  Cardiovascular: s1,s2 rrr  Respiratory: few LL crackles   Abdomen: soft, nt, nd   Musculoskeletal: LE edema   Data Reviewed: Basic Metabolic Panel:  Recent Labs Lab 12/02/13 2210 12/03/13 0430 12/04/13 0415 12/04/13 2005 12/05/13 0415 12/06/13 0440  NA 141 140 141 142 142 143  K 3.9 3.9 2.6* 3.0* 3.2* 2.9*  CL 100 100 98 99 100 99  CO2 _0 33*  GLUCOSE 91 156* 144* 135* 143* 130*  BUN _1 25* 25* 25*  CREATININE 1.62* 1.63* 1.50* 1.53* 1.49* 1.21*  CALCIUM 8.8 8.6 8.1* 8.1* 7.9* 7.9*  MG 1.8  --   --   --   --   --   PHOS 4.3  --   --   --   --   --    Liver Function Tests:  Recent Labs Lab 11/30/13 0530 12/02/13 2210  AST 14 18  ALT 18 17  ALKPHOS 69 74  BILITOT 1.0 1.8*  PROT 6.4 6.6  ALBUMIN 2.2* 2.1*   No results found for this basename: LIPASE, AMYLASE,  in the last 168 hours No results found for this basename: AMMONIA,  in the last 168 hours CBC:  Recent Labs Lab 12/01/13 0512  12/02/13 2210 12/03/13 0430 12/03/13 1720 12/04/13 0415 12/05/13 0415 12/06/13 0440  WBC 17.2*  < > 13.2* 13.5* 10.0 8.3 5.5 4.1  NEUTROABS 13.1*  --  10.7*  --  8.5*  --   --   --   HGB 7.2*  < > 8.9* 8.9* 9.4* 8.7* 8.9* 8.5*  HCT 22.7*  < > 26.6* 27.6* 28.4* 26.6* 26.2* 26.0*  MCV 90.1  < > 86.9 87.9 85.8 86.9 85.6 87.5  PLT 16*  < > 51* 44* 36* 32* 35* 23*  < > = values in this interval not displayed. Cardiac Enzymes:  Recent Labs Lab 12/02/13 2210 12/03/13 0430 12/03/13 0830  TROPONINI <0.30 <0.30 <0.30   BNP (last 3 results)  Recent Labs  11/14/13 0435 11/17/13 0800  11/20/13 0515  PROBNP 2977.0* 9541.0* 3178.0*   CBG: No results found for this basename: GLUCAP,  in the last 168 hours  Recent Results (from the past 240 hour(s))  CMV CULTURE CMVC     Status: None   Collection Time    11/30/13 12:24 PM      Result Value Ref Range Status   Specimen Description MOUTH TONGUE ULCER   Final   Special Requests Immunocompromised   Final   Culture     Final   Value: No Cytomegalovirus identified in cell culture                                                                A negative result does not exclude the possibility of CMV infection;inappropriate specimen collection,storage and transport may lead to false      negative results.     Performed at [facility]   Report Status 12/04/2013 FINAL   Final  FUNGUS CULTURE W SMEAR     Status: None   Collection Time    11/30/13 12:25 PM      Result Value Ref Range Status   Specimen Description ORAL TONGUE ULCER   Final   Special Requests Immunocompromised   Final   Fungal Smear     Final   Value: NO YEAST OR FUNGAL ELEMENTS SEEN     Performed at [facility]   Culture     Final   Value: Culture in Progress for 7 days     Performed at [facility]   Report Status PENDING   Incomplete  WOUND CULTURE     Status: None   Collection Time    11/30/13 12:25 PM      Result Value Ref Range Status   Specimen Description ORAL TONGUE ULCER   Final   Special Requests Immunocompromised   Final   Gram Stain     Final   Value: NO WBC SEEN     RARE SQUAMOUS EPITHELIAL CELLS PRESENT     MODERATE GRAM POSITIVE RODS     Performed at [facility]   Culture     Final   Value: MULTIPLE ORGANISMS PRESENT, NONE PREDOMINANT     Note: NO STAPHYLOCOCCUS AUREUS ISOLATED NO GROUP A STREP (S.PYOGENES) ISOLATED     Performed at [facility]   Report Status 12/02/2013 FINAL   Final  CULTURE, BLOOD (ROUTINE X 2)  Status: None   Collection Time    12/01/13  8:12 AM      Result Value  Ref Range Status   Specimen Description BLOOD LEFT HAND   Final   Special Requests BOTTLES DRAWN AEROBIC AND ANAEROBIC 6 MLS EACH   Final   Culture  Setup Time     Final   Value: 12/01/2013 10:37     Performed at [facility]   Culture     Final   Value:        BLOOD CULTURE RECEIVED NO GROWTH TO DATE CULTURE WILL BE HELD FOR 5 DAYS BEFORE ISSUING A FINAL NEGATIVE REPORT     Performed at [facility]   Report Status PENDING   Incomplete  CULTURE, BLOOD (ROUTINE X 2)     Status: None   Collection Time    12/01/13  8:15 AM      Result Value Ref Range Status   Specimen Description BLOOD LEFT HAND   Final   Special Requests BOTTLES DRAWN AEROBIC AND ANAEROBIC 3 MLS EACH   Final   Culture  Setup Time     Final   Value: 12/01/2013 10:37     Performed at [facility]   Culture     Final   Value:        BLOOD CULTURE RECEIVED NO GROWTH TO DATE CULTURE WILL BE HELD FOR 5 DAYS BEFORE ISSUING A FINAL NEGATIVE REPORT     Performed at [facility]   Report Status PENDING   Incomplete  URINE CULTURE     Status: None   Collection Time    12/01/13 11:40 AM      Result Value Ref Range Status   Specimen Description URINE, CATHETERIZED   Final   Special Requests NONE   Final   Culture  Setup Time     Final   Value: 12/01/2013 14:31     Performed at [place]     Final   Value: NO GROWTH     Performed at [facility]   Culture     Final   Value: NO GROWTH     Performed at [facility]   Report Status 12/02/2013 FINAL   Final  CULTURE, EXPECTORATED SPUTUM-ASSESSMENT     Status: None   Collection Time    12/03/13  3:47 AM      Result Value Ref Range Status   Specimen Description SPUTUM   Final   Special Requests Immunocompromised   Final   Sputum evaluation     Final   Value: MICROSCOPIC FINDINGS SUGGEST THAT THIS SPECIMEN IS NOT REPRESENTATIVE OF LOWER RESPIRATORY SECRETIONS. PLEASE RECOLLECT.     RESULTS CALLED TO,  READ BACK BY AND VERIFIED WITH PAT MICKLEY,RN [id number] @ 0623 BY J SCOTTON   Report Status 12/03/2013 FINAL   Final  CLOSTRIDIUM DIFFICILE BY PCR     Status: None   Collection Time    12/06/13 10:51 AM      Result Value Ref Range Status   C difficile by pcr NEGATIVE  NEGATIVE Final   Comment: Performed at [facility]     Studies: Dg Chest Port 1 View  12/05/2013   CLINICAL DATA:  Airspace disease  EXAM: PORTABLE CHEST - 1 VIEW  COMPARISON:  Prior chest x-ray 12/02/2013, chest CT 12/01/2013  FINDINGS: Decreasing volume of the right pleural effusion. There is persistent loculated fluid within the minor fissure.  Stable cardiac and mediastinal contours. Left upper extremity approach PICC with the catheter projecting over the superior cavoatrial junction. No pneumothorax. Left basilar atelectasis versus infiltrate is unchanged. Mild vascular congestion without overt edema. Chronic right upper lobe collapse resulting in elevation of the minor fissure.  IMPRESSION: Decreasing right pleural effusion with improving aeration of the right lower and middle lobes.  Persistent pulmonary vascular congestion without overt edema, left basilar atelectasis and chronic atelectasis/scarring in the right upper lobe.   Electronically Signed   By: Jacqulynn Cadet M.D.   On: 12/05/2013 07:52    Scheduled Meds: . acyclovir  5 mg/kg (Ideal) Intravenous Q8H  . ceFEPime (MAXIPIME) IV  1 g Intravenous Q12H  . DULoxetine  60 mg Oral QPM  . fluticasone  2 spray Each Nare Daily  . furosemide  60 mg Intravenous BID  . hydrocortisone sodium succinate  50 mg Intravenous Q8H  . linezolid  600 mg Intravenous Q12H  . micafungin (MYCAMINE) IV  100 mg Intravenous Q24H  . ondansetron  8 mg Oral Once  . pantoprazole (PROTONIX) IV  40 mg Intravenous Q24H  . sodium chloride  10-40 mL Intracatheter Q12H  . sodium chloride  3 mL Intravenous Q12H   Continuous Infusions:   Active Problems:   Myelodysplasia   Symptomatic  anemia   Adrenal infarction   Acute renal failure   Acute on chronic diastolic heart failure   Altered mental status   Acute-on-chronic respiratory failure   Hypotension, unspecified   Severe sepsis   DVT (deep venous thrombosis)   Pleural effusion   Thrombocytopenia    Time spent: 35 minutes     [facility] Pager [phone]. If 7PM-7AM, please contact night-coverage at [email or web address], password [facility] 12/06/2013, 5:18 PM  LOS: 29 days

## 2013-12-07 DIAGNOSIS — E876 Hypokalemia: Secondary | ICD-10-CM

## 2013-12-07 LAB — CULTURE, BLOOD (ROUTINE X 2)
Culture: NO GROWTH
Culture: NO GROWTH

## 2013-12-07 LAB — CBC
HCT: 26.5 % — ABNORMAL LOW (ref 36.0–46.0)
HEMOGLOBIN: 8.4 g/dL — AB (ref 12.0–15.0)
MCH: 28.1 pg (ref 26.0–34.0)
MCHC: 31.7 g/dL (ref 30.0–36.0)
MCV: 88.6 fL (ref 78.0–100.0)
Platelets: 21 10*3/uL — CL (ref 150–400)
RBC: 2.99 MIL/uL — ABNORMAL LOW (ref 3.87–5.11)
RDW: 19.2 % — ABNORMAL HIGH (ref 11.5–15.5)
WBC: 4.4 10*3/uL (ref 4.0–10.5)

## 2013-12-07 LAB — LACTATE DEHYDROGENASE: LDH: 410 U/L — ABNORMAL HIGH (ref 94–250)

## 2013-12-07 LAB — BASIC METABOLIC PANEL
BUN: 22 mg/dL (ref 6–23)
CO2: 35 mEq/L — ABNORMAL HIGH (ref 19–32)
Calcium: 7.9 mg/dL — ABNORMAL LOW (ref 8.4–10.5)
Chloride: 94 mEq/L — ABNORMAL LOW (ref 96–112)
Creatinine, Ser: 0.94 mg/dL (ref 0.50–1.10)
GFR calc non Af Amer: 65 mL/min — ABNORMAL LOW (ref >90)
GFR, EST AFRICAN AMERICAN: 75 mL/min — AB (ref >90)
GLUCOSE: 96 mg/dL (ref 70–99)
POTASSIUM: 2.3 meq/L — AB (ref 3.7–5.3)
SODIUM: 139 meq/L (ref 137–147)

## 2013-12-07 LAB — FUNGUS CULTURE W SMEAR
CULTURE: NO GROWTH
FUNGAL SMEAR: NONE SEEN

## 2013-12-07 LAB — MAGNESIUM: MAGNESIUM: 1.6 mg/dL (ref 1.5–2.5)

## 2013-12-07 MED ORDER — POTASSIUM CHLORIDE 20 MEQ/15ML (10%) PO LIQD
40.0000 meq | ORAL | Status: DC
  Administered 2013-12-07: 40 meq via ORAL
  Filled 2013-12-07 (×5): qty 30

## 2013-12-07 MED ORDER — ONDANSETRON HCL 8 MG PO TABS
8.0000 mg | ORAL_TABLET | Freq: Once | ORAL | Status: AC
  Administered 2013-12-07: 8 mg via ORAL
  Filled 2013-12-07: qty 2
  Filled 2013-12-07: qty 1

## 2013-12-07 MED ORDER — HYDROCORTISONE 20 MG PO TABS
20.0000 mg | ORAL_TABLET | Freq: Two times a day (BID) | ORAL | Status: DC
  Administered 2013-12-07: 20 mg via ORAL
  Filled 2013-12-07 (×3): qty 1

## 2013-12-07 MED ORDER — AZACITIDINE CHEMO SQ INJECTION
75.0000 mg/m2 | Freq: Once | INTRAMUSCULAR | Status: AC
  Administered 2013-12-07: 145 mg via SUBCUTANEOUS
  Filled 2013-12-07: qty 5.8

## 2013-12-07 MED ORDER — MAGNESIUM OXIDE 400 (241.3 MG) MG PO TABS
400.0000 mg | ORAL_TABLET | Freq: Two times a day (BID) | ORAL | Status: DC
  Administered 2013-12-07 – 2013-12-13 (×13): 400 mg via ORAL
  Filled 2013-12-07 (×14): qty 1

## 2013-12-07 MED ORDER — POTASSIUM CHLORIDE 10 MEQ/100ML IV SOLN
10.0000 meq | INTRAVENOUS | Status: AC
  Administered 2013-12-07 (×6): 10 meq via INTRAVENOUS
  Filled 2013-12-07 (×6): qty 100
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
## 2013-12-07 NOTE — Progress Notes (Signed)
OT Cancellation Note  Patient Details Name: Lacey [name] MRN: [id number] DOB: 1956-09-02   Cancelled Treatment:    Reason Eval/Treat Not Completed: Other (comment).  Checked on pt earlier and she didn't feel up to any EOB activity however, she lost theraband in room transitions and states she will work on this when replaced.  She is now asleep.  Left theraband in room and will return tomorrow to review exercises and hopefully see her for increased mobility.  [name] 12/07/2013, 2:46 PM [name], OTR/L [phone] 12/07/2013

## 2013-12-07 NOTE — Progress Notes (Signed)
[facility] PROGRESS NOTE  Lacey [name] EVO:[id number] DOB: September 18, 1956 DOA: 11/15/2013 PCP: Vena Austria, MD  Brief summary  58 y.o. female with history of Hodgkin's lymphoma status post chemotherapy now with myelodysplastic syndrome who initially presented with abdominal pain, nausea, vomiting, hypotension and complicated with adrenal infarct, adrenal insufficiency, HCAP, recurrent sepsis, hypotension RNF<-->ICU/pressure support+IV atx;  -She also developed hemorrhage into her left iliopsoas muscle which caused weakness and pain of her left leg and LLQ. -Dr. Marin Olp is determining what treatment to initiate for her MDS as she has required frequent transfusions.   Assessment/Plan  1. Myelodysplastic syndrome in setting of Hodgkin Lymphoma:  -Refractory anemia and thrombocytopenia requiring almost daily blood product transfusions, Although for the past 2 days counts appear to be stable.  Bone Marrow Biopsy: HYPERCELLULAR BONE MARROW FOR AGE WITH DYSPOIETIC CHANGES. -myelodysplastic/myeloproliferative process, particularly chronic myelomonocytic leukemia - Appreciate Dr Jonette Eva assistance. on frequent daily transfusions (PRBC+platelets) as patient is developing antibodies. Per hem/onc to start [facility] on 3/11  2. Recurrent fever/septic shock, pneumonia Likely due dysfunction leukocytes as above  -CT chest on 2/23: Persistent consolidation RML with new peribronchovascular opacities suggestive of bronchopneumonia. Underwent bronch on 2/26; BCx NGTD,  Diff PCR neg, BAL: PCP neg, Bacterial, fungal, and AFB cultures NGTD  - 3/6: febrile; CT: Persistent right upper lobe collapse/ consolidation with interval development of central airspace disease in the left upper lobe  - 3/6: restarted IV atx, antifungals, acyclovir  -off pressors; clinically improving, started taper IV steroids 3/10  -3/11: PICC line is out. Will stop all antibiotics and antifungals as all cx data has been negative  to date. Hopefully fevers and "sepsis" will not recur.  3. Acute on chronic diastolic heart failure due to IVF from resuscitation. Echo (2/15): Preserved EF and grade 1 diastolic heart failure -R sided pleural effusion; anasarca, cont IV lasix; Daily weights and strict I/O; monitor renal function -per pulmonology deferred thoracentesis due to thrombocytopenia, R sided pleural effusion    4. 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 -Will decrease hydrocortisone from 50 mg IV q8 to q12, with hope of transitioning to PO over the next couple days.    5. Acute Renal Failure due to septic shock; RUS wnl. Hypervolemic  - resumed lasix due to respiratory status; close monitor   6. Spontaneous retroperitoneal bleed in setting of thrombocytopenia; hemorrhage into her left iliopsoas muscle which caused weakness and pain of her left leg and LLQ - on fentanyl; cont pain control  -Resume PT/OT.  7. Right upper extremity superficial thrombosis, stable   8. Transaminitis due to septic shock, resolved.   9. Urge and stress incontinence; Foley placed for comfort   10. Tongue ulcer; cont as above; biopsied by ENT; path: SQUAMOUS EPITHELIUM. NO DYSPLASIA OR MALIGNANCY -improving   11. Hypokalemia -Replete PO. -Mg 1.6 (low-normal); will place on PO mag supplementation as well.    Proph: Hold for thrombocytopenia, spont retroperitoneal bleed   Code Status: full  Family Communication: patient alone,  Disposition Plan: Clinically improving. PT/OT recommending CIR vs. SNF.   Consultants:  Dr. Burney Gauze (oncology)  Dr Gibson Ramp (Surgery) curbside consult  Dr. Carlyle Basques (infectious disease)    Procedures:  11/12/2013 right upper extremity Doppler  No evidence of deep vein thrombosis involving the right upper extremity, left subclavian vein, and left internal jugular vein. There is superficial thrombosis noted in the right  cephalic vein.  Old PICC removed 3/5; Left-sided PICC  03/06 >>>  Foley 3/2 >>>   -2/12 Bone marrow biopsy report pending  Echocardiogram 01/10/2013  - Left ventricle: mild focal basal hypertrophy of the septum.  -LVEF= ejection fraction was 55%. Wall motion was normal; there were no regional wall motion abnormalities.  -(grade 1 diastolic dysfunction).  - Aortic valve: There was no stenosis. Trivial regurgitation. - Mitral valve: Trivial regurgitation. - Left atrium: The atrium was mildly dilated. - Right ventricle: The cavity size was normal. Systolic function was normal. - Tricuspid valve: Peak RV-RA gradient: 27mm Hg (S). - Pulmonary arteries: PA peak pressure: 22mm Hg (S). - Inferior vena cava: The vessel was normal in size; the respirophasic diameter changes were in the normal range (=50%); findings are consistent with normal central venous pressure.  Antibiotics:  Previous Courses of Imipenem, Vancomycin, Fluconazole  Acyclovir 3/5 >>>  Cefepime 3/7 >>>  Micafungin 3/7 >>>  Zyvox 3/7 >>>   2/15 Upper extremity Dopplers demonstrated acute clot of the right cephalic vein  2/72 Chest CT demonstrated loculated right pleural effusion which does not appear to have intact  2/17 Echocardiogram demonstrates EF of 53-66% with diastolic dysfunction.  2/24 Abdominal CT demonstrated hemorrhage in the left iliacus muscle  2/23 Chest CT demonstrated persistent right pleural effusion and right middle lobe density  2/25 Head CT demonstrated sphenoid sinusitis  2/26 Bronchoscopy did not reveal an infectious organism  3/3 Lower extremity Dopplers did not detect DVTs  3/6 Chest CT> persistent collapse of the RML, RLL with a large pleural effusion. Development of a new left airspace opacity.  3/8 TTE >>> Ef 65%, no RWMA, PAP 35 torr  3/8 Abdomen CT >>> No findings consistent with sepsis, evolution of L iliac hematoma  3/8 [place] R Effusion>>>small in volume, loculated & complex appearing by [place], [place]   Consultants:  Oncology  HPI/Subjective: alert  Objective: Filed Vitals:   12/07/13 1330  BP: 154/79  Pulse: 100  Temp: 97.7 F (36.5 C)  Resp: 18    Intake/Output Summary (Last 24 hours) at 12/07/13 1510 Last data filed at 12/07/13 1335  Gross per 24 hour  Intake    720 ml  Output   7100 ml  Net  -6380 ml   Filed Weights   12/05/13 1515 12/06/13 0425 12/07/13 0521  Weight: 87.4 kg (192 lb 10.9 oz) 87 kg (191 lb 12.8 oz) 86.8 kg (191 lb 5.8 oz)    Exam:   General:  alert  Cardiovascular: s1,s2 rrr  Respiratory: few LL crackles   Abdomen: soft, nt, nd   Musculoskeletal: LE edema   Data Reviewed: Basic Metabolic Panel:  Recent Labs Lab 12/02/13 2210  12/04/13 0415 12/04/13 2005 12/05/13 0415 12/06/13 0440 12/07/13 0525  NA 141  < > 141 142 142 143 139  K 3.9  < > 2.6* 3.0* 3.2* 2.9* 2.3*  CL 100  < > 98 99 100 99 94*  CO2 28  < > $R'30 31 31 'JA$ 33* 35*  GLUCOSE 91  < > 144* 135* 143* 130* 96  BUN 15  < > 21 25* 25* 25* 22  CREATININE 1.62*  < > 1.50* 1.53* 1.49* 1.21* 0.94  CALCIUM 8.8  < > 8.1* 8.1* 7.9* 7.9* 7.9*  MG 1.8  --   --   --   --   --  1.6  PHOS 4.3  --   --   --   --   --   --   < > = values in  this interval not displayed. Liver Function Tests:  Recent Labs Lab 12/02/13 2210  AST 18  ALT 17  ALKPHOS 74  BILITOT 1.8*  PROT 6.6  ALBUMIN 2.1*   No results found for this basename: LIPASE, AMYLASE,  in the last 168 hours No results found for this basename: AMMONIA,  in the last 168 hours CBC:  Recent Labs Lab 12/01/13 0512  12/02/13 2210  12/03/13 1720 12/04/13 0415 12/05/13 0415 12/06/13 0440 12/07/13 0525  WBC 17.2*  < > 13.2*  < > 10.0 8.3 5.5 4.1 4.4  NEUTROABS 13.1*  --  10.7*  --  8.5*  --   --   --   --   HGB 7.2*  < > 8.9*  < > 9.4* 8.7* 8.9* 8.5* 8.4*  HCT 22.7*  < > 26.6*  < > 28.4* 26.6* 26.2* 26.0* 26.5*  MCV 90.1  < > 86.9  < > 85.8 86.9 85.6 87.5 88.6  PLT 16*  < > 51*  < > 36* 32* 35* 23* 21*  < > =  values in this interval not displayed. Cardiac Enzymes:  Recent Labs Lab 12/02/13 2210 12/03/13 0430 12/03/13 0830  TROPONINI <0.30 <0.30 <0.30   BNP (last 3 results)  Recent Labs  11/14/13 0435 11/17/13 0800 11/20/13 0515  PROBNP 2977.0* 9541.0* 3178.0*   CBG: No results found for this basename: GLUCAP,  in the last 168 hours  Recent Results (from the past 240 hour(s))  CMV CULTURE CMVC     Status: None   Collection Time    11/30/13 12:24 PM      Result Value Ref Range Status   Specimen Description MOUTH TONGUE ULCER   Final   Special Requests Immunocompromised   Final   Culture     Final   Value: No Cytomegalovirus identified in cell culture                                                                A negative result does not exclude the possibility of CMV infection;inappropriate specimen collection,storage and transport may lead to false      negative results.     Performed at [facility]   Report Status 12/04/2013 FINAL   Final  FUNGUS CULTURE W SMEAR     Status: None   Collection Time    11/30/13 12:25 PM      Result Value Ref Range Status   Specimen Description ORAL TONGUE ULCER   Final   Special Requests Immunocompromised   Final   Fungal Smear     Final   Value: NO YEAST OR FUNGAL ELEMENTS SEEN     Performed at [facility]   Culture     Final   Value: NO GROWTH 7 DAYS     Performed at [facility]   Report Status 12/07/2013 FINAL   Final  WOUND CULTURE     Status: None   Collection Time    11/30/13 12:25 PM      Result Value Ref Range Status   Specimen Description ORAL TONGUE ULCER   Final   Special Requests Immunocompromised   Final   Gram Stain     Final   Value: NO WBC SEEN     RARE  SQUAMOUS EPITHELIAL CELLS PRESENT     MODERATE GRAM POSITIVE RODS     Performed at [facility]   Culture     Final   Value: MULTIPLE ORGANISMS PRESENT, NONE PREDOMINANT     Note: NO STAPHYLOCOCCUS AUREUS ISOLATED NO GROUP A STREP  (S.PYOGENES) ISOLATED     Performed at [facility]   Report Status 12/02/2013 FINAL   Final  CULTURE, BLOOD (ROUTINE X 2)     Status: None   Collection Time    12/01/13  8:12 AM      Result Value Ref Range Status   Specimen Description BLOOD LEFT HAND   Final   Special Requests BOTTLES DRAWN AEROBIC AND ANAEROBIC 6 MLS EACH   Final   Culture  Setup Time     Final   Value: 12/01/2013 10:37     Performed at [facility]   Culture     Final   Value: NO GROWTH 5 DAYS     Performed at [facility]   Report Status 12/07/2013 FINAL   Final  CULTURE, BLOOD (ROUTINE X 2)     Status: None   Collection Time    12/01/13  8:15 AM      Result Value Ref Range Status   Specimen Description BLOOD LEFT HAND   Final   Special Requests BOTTLES DRAWN AEROBIC AND ANAEROBIC 3 MLS EACH   Final   Culture  Setup Time     Final   Value: 12/01/2013 10:37     Performed at [facility]   Culture     Final   Value: NO GROWTH 5 DAYS     Performed at [facility]   Report Status 12/07/2013 FINAL   Final  URINE CULTURE     Status: None   Collection Time    12/01/13 11:40 AM      Result Value Ref Range Status   Specimen Description URINE, CATHETERIZED   Final   Special Requests NONE   Final   Culture  Setup Time     Final   Value: 12/01/2013 14:31     Performed at [facility]     Final   Value: NO GROWTH     Performed at [facility]   Culture     Final   Value: NO GROWTH     Performed at [facility]   Report Status 12/02/2013 FINAL   Final  CULTURE, EXPECTORATED SPUTUM-ASSESSMENT     Status: None   Collection Time    12/03/13  3:47 AM      Result Value Ref Range Status   Specimen Description SPUTUM   Final   Special Requests Immunocompromised   Final   Sputum evaluation     Final   Value: MICROSCOPIC FINDINGS SUGGEST THAT THIS SPECIMEN IS NOT REPRESENTATIVE OF LOWER RESPIRATORY SECRETIONS. PLEASE RECOLLECT.      RESULTS CALLED TO, READ BACK BY AND VERIFIED WITH PAT MICKLEY,RN [id number] @ [id number] BY J SCOTTON   Report Status 12/03/2013 FINAL   Final  CLOSTRIDIUM DIFFICILE BY PCR     Status: None   Collection Time    12/06/13 10:51 AM      Result Value Ref Range Status   C difficile by pcr NEGATIVE  NEGATIVE Final   Comment: Performed at [facility]     Studies: No results found.  Scheduled Meds: . DULoxetine  60 mg Oral QPM  .  fluticasone  2 spray Each Nare Daily  . furosemide  60 mg Intravenous BID  . hydrocortisone sodium succinate  50 mg Intravenous Q12H  . ondansetron  8 mg Oral Once  . pantoprazole (PROTONIX) IV  40 mg Intravenous Q24H  . potassium chloride  40 mEq Oral Q4H  . sodium chloride  10-40 mL Intracatheter Q12H  . sodium chloride  3 mL Intravenous Q12H   Continuous Infusions:   Active Problems:   Myelodysplasia   Symptomatic anemia   Adrenal infarction   Acute renal failure   Acute on chronic diastolic heart failure   Altered mental status   Acute-on-chronic respiratory failure   Hypotension, unspecified   Severe sepsis   DVT (deep venous thrombosis)   Pleural effusion   Thrombocytopenia   Hypokalemia    Time spent: 25 minutes     [facility] Pager [phone]. If 7PM-7AM, please contact night-coverage at [email or web address], password [email or web address] 12/07/2013, 3:10 PM  LOS: 30 days

## 2013-12-07 NOTE — Progress Notes (Signed)
PT Cancellation Note  Patient Details Name: Lacey [name] MRN: [id number] DOB: Apr 24, 1956   Cancelled Treatment:    Reason Eval/Treat Not Completed: Patient declined, no reason specified. Will check back another day.    Weston Anna, MPT Pager: [phone]

## 2013-12-08 LAB — CBC
HCT: 28.7 % — ABNORMAL LOW (ref 36.0–46.0)
HEMOGLOBIN: 9.1 g/dL — AB (ref 12.0–15.0)
MCH: 28.1 pg (ref 26.0–34.0)
MCHC: 31.7 g/dL (ref 30.0–36.0)
MCV: 88.6 fL (ref 78.0–100.0)
Platelets: 20 10*3/uL — CL (ref 150–400)
RBC: 3.24 MIL/uL — ABNORMAL LOW (ref 3.87–5.11)
RDW: 18.7 % — ABNORMAL HIGH (ref 11.5–15.5)
WBC: 4.9 10*3/uL (ref 4.0–10.5)

## 2013-12-08 LAB — BASIC METABOLIC PANEL
BUN: 19 mg/dL (ref 6–23)
CHLORIDE: 91 meq/L — AB (ref 96–112)
CO2: 36 mEq/L — ABNORMAL HIGH (ref 19–32)
Calcium: 8.7 mg/dL (ref 8.4–10.5)
Creatinine, Ser: 0.95 mg/dL (ref 0.50–1.10)
GFR, EST AFRICAN AMERICAN: 75 mL/min — AB (ref >90)
GFR, EST NON AFRICAN AMERICAN: 64 mL/min — AB (ref >90)
Glucose, Bld: 138 mg/dL — ABNORMAL HIGH (ref 70–99)
POTASSIUM: 3.1 meq/L — AB (ref 3.7–5.3)
SODIUM: 137 meq/L (ref 137–147)

## 2013-12-08 LAB — LACTATE DEHYDROGENASE: LDH: 355 U/L — AB (ref 94–250)

## 2013-12-08 MED ORDER — POTASSIUM CHLORIDE 10 MEQ/100ML IV SOLN
10.0000 meq | INTRAVENOUS | Status: AC
  Administered 2013-12-08 (×6): 10 meq via INTRAVENOUS
  Filled 2013-12-08 (×6): qty 100

## 2013-12-08 MED ORDER — HYDROCORTISONE 10 MG PO TABS
10.0000 mg | ORAL_TABLET | Freq: Two times a day (BID) | ORAL | Status: DC
  Administered 2013-12-08 – 2013-12-09 (×3): 10 mg via ORAL
  Administered 2013-12-09: 21:00:00 via ORAL
  Administered 2013-12-10 – 2013-12-19 (×20): 10 mg via ORAL
  Filled 2013-12-08 (×26): qty 1

## 2013-12-08 MED ORDER — ONDANSETRON HCL 8 MG PO TABS
8.0000 mg | ORAL_TABLET | Freq: Once | ORAL | Status: AC
  Administered 2013-12-08: 8 mg via ORAL
  Filled 2013-12-08: qty 1

## 2013-12-08 MED ORDER — AZACITIDINE CHEMO SQ INJECTION
75.0000 mg/m2 | Freq: Once | INTRAMUSCULAR | Status: AC
  Administered 2013-12-08: 145 mg via SUBCUTANEOUS
  Filled 2013-12-08: qty 5.8

## 2013-12-08 MED ORDER — PANTOPRAZOLE SODIUM 40 MG PO TBEC
40.0000 mg | DELAYED_RELEASE_TABLET | Freq: Every day | ORAL | Status: DC
  Administered 2013-12-08 – 2013-12-18 (×11): 40 mg via ORAL
  Filled 2013-12-08 (×13): qty 1

## 2013-12-08 NOTE — Progress Notes (Signed)
[facility] PROGRESS NOTE  Lacey [name] YHC:[id number] DOB: Nov 29, 1955 DOA: 11/16/2013 PCP: Vena Austria, MD  Brief summary  58 y.o. female with history of Hodgkin's lymphoma status post chemotherapy now with myelodysplastic syndrome who initially presented with abdominal pain, nausea, vomiting, hypotension and complicated with adrenal infarct, adrenal insufficiency, HCAP, recurrent sepsis, hypotension RNF<-->ICU/pressure support+IV atx;  -She also developed hemorrhage into her left iliopsoas muscle which caused weakness and pain of her left leg and LLQ. -Dr. Marin Olp is determining what treatment to initiate for her MDS as she has required frequent transfusions.   Assessment/Plan  1. Myelodysplastic syndrome in setting of Hodgkin Lymphoma:  -Refractory anemia and thrombocytopenia requiring almost daily blood product transfusions, Although for the past 3 days counts appear to be stable.  Bone Marrow Biopsy: HYPERCELLULAR BONE MARROW FOR AGE WITH DYSPOIETIC CHANGES. -myelodysplastic/myeloproliferative process, particularly chronic myelomonocytic leukemia - Appreciate Dr Jonette Eva assistance. on frequent daily transfusions (PRBC+platelets) as patient is developing antibodies. Per hem/onc to start [place] on 3/11  2. Recurrent fever/septic shock, pneumonia Likely due dysfunction leukocytes as above  -CT chest on 2/23: Persistent consolidation RML with new peribronchovascular opacities suggestive of bronchopneumonia. Underwent bronch on 2/26; BCx NGTD,  Diff PCR neg, BAL: PCP neg, Bacterial, fungal, and AFB cultures NGTD  - 3/6: febrile; CT: Persistent right upper lobe collapse/ consolidation with interval development of central airspace disease in the left upper lobe  - 3/6: restarted IV atx, antifungals, acyclovir  -off pressors; clinically improving, started taper IV steroids 3/10  -3/11: PICC line is out. Will stop all antibiotics and antifungals as all cx data has been negative  to date. Hopefully fevers and "sepsis" will not recur.  3. Acute on chronic diastolic heart failure due to IVF from resuscitation. Echo (2/15): Preserved EF and grade 1 diastolic heart failure -R sided pleural effusion; anasarca, cont IV lasix; Daily weights and strict I/O; monitor renal function -per pulmonology deferred thoracentesis due to thrombocytopenia, R sided pleural effusion    4. 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 -Will decrease hydrocortisone from 50 mg IV q8 to q12, with hope of transitioning to PO over the next couple days.    5. Acute Renal Failure due to septic shock; RUS wnl. Hypervolemic  - resumed lasix due to respiratory status; close monitor   6. Spontaneous retroperitoneal bleed in setting of thrombocytopenia; hemorrhage into her left iliopsoas muscle which caused weakness and pain of her left leg and LLQ - on fentanyl; cont pain control  -Resume PT/OT.  7. Right upper extremity superficial thrombosis, stable   8. Transaminitis due to septic shock, resolved.   9. Urge and stress incontinence; Foley placed for comfort   10. Tongue ulcer; cont as above; biopsied by ENT; path: SQUAMOUS EPITHELIUM. NO DYSPLASIA OR MALIGNANCY -improving   11. Hypokalemia -Replete PO. -Mg 1.6 (low-normal); will place on PO mag supplementation as well.    Proph: Hold for thrombocytopenia, spont retroperitoneal bleed   Code Status: full  Family Communication: patient alone,  Disposition Plan: Will need SNF. Patient agrees. SW consult requested.  Consultants:  Dr. Burney Gauze (oncology)  Dr Gibson Ramp (Surgery) curbside consult  Dr. Carlyle Basques (infectious disease)    Procedures:  11/12/2013 right upper extremity Doppler  No evidence of deep vein thrombosis involving the right upper extremity, left subclavian vein, and left internal jugular vein. There is superficial thrombosis noted in the right  cephalic vein.  Old PICC removed 3/5; Left-sided PICC  03/06 >>>  Foley 3/2 >>>   -2/12 Bone marrow biopsy report pending  Echocardiogram 01/10/2013  - Left ventricle: mild focal basal hypertrophy of the septum.  -LVEF= ejection fraction was 55%. Wall motion was normal; there were no regional wall motion abnormalities.  -(grade 1 diastolic dysfunction).  - Aortic valve: There was no stenosis. Trivial regurgitation. - Mitral valve: Trivial regurgitation. - Left atrium: The atrium was mildly dilated. - Right ventricle: The cavity size was normal. Systolic function was normal. - Tricuspid valve: Peak RV-RA gradient: 79m Hg (S). - Pulmonary arteries: PA peak pressure: 247mHg (S). - Inferior vena cava: The vessel was normal in size; the respirophasic diameter changes were in the normal range (=50%); findings are consistent with normal central venous pressure.  Antibiotics:  Previous Courses of Imipenem, Vancomycin, Fluconazole  Acyclovir 3/5 >>>  Cefepime 3/7 >>>  Micafungin 3/7 >>>  Zyvox 3/7 >>>   2/15 Upper extremity Dopplers demonstrated acute clot of the right cephalic vein  2/3/01hest CT demonstrated loculated right pleural effusion which does not appear to have intact  2/17 Echocardiogram demonstrates EF of 5560-10%ith diastolic dysfunction.  2/24 Abdominal CT demonstrated hemorrhage in the left iliacus muscle  2/23 Chest CT demonstrated persistent right pleural effusion and right middle lobe density  2/25 Head CT demonstrated sphenoid sinusitis  2/26 Bronchoscopy did not reveal an infectious organism  3/3 Lower extremity Dopplers did not detect DVTs  3/6 Chest CT> persistent collapse of the RML, RLL with a large pleural effusion. Development of a new left airspace opacity.  3/8 TTE >>> Ef 65%, no RWMA, PAP 35 torr  3/8 Abdomen CT >>> No findings consistent with sepsis, evolution of L iliac hematoma  3/8 [place] Effusion>>>small in volume, loculated & complex appearing by [place] [facility]:  Oncology  HPI/Subjective: alert  Objective: Filed Vitals:   12/08/13 1430  BP: 136/71  Pulse: 88  Temp: 98 F (36.7 C)  Resp: 18    Intake/Output Summary (Last 24 hours) at 12/08/13 1746 Last data filed at 12/08/13 1700  Gross per 24 hour  Intake   1410 ml  Output   5350 ml  Net  -3940 ml   Filed Weights   12/06/13 0425 12/07/13 0521 12/08/13 0622  Weight: 87 kg (191 lb 12.8 oz) 86.8 kg (191 lb 5.8 oz) 85.1 kg (187 lb 9.8 oz)    Exam:   General:  alert  Cardiovascular: s1,s2 rrr  Respiratory: few LL crackles   Abdomen: soft, nt, nd   Musculoskeletal: LE edema   Data Reviewed: Basic Metabolic Panel:  Recent Labs Lab 12/02/13 2210  12/04/13 2005 12/05/13 0415 12/06/13 0440 12/07/13 0525 12/08/13 0455  NA 141  < > 142 142 143 139 137  K 3.9  < > 3.0* 3.2* 2.9* 2.3* 3.1*  CL 100  < > 99 100 99 94* 91*  CO2 28  < > 31 31 33* 35* 36*  GLUCOSE 91  < > 135* 143* 130* 96 138*  BUN 15  < > 25* 25* 25* 22 19  CREATININE 1.62*  < > 1.53* 1.49* 1.21* 0.94 0.95  CALCIUM 8.8  < > 8.1* 7.9* 7.9* 7.9* 8.7  MG 1.8  --   --   --   --  1.6  --   PHOS 4.3  --   --   --   --   --   --   < > = values in this  interval not displayed. Liver Function Tests:  Recent Labs Lab 12/02/13 2210  AST 18  ALT 17  ALKPHOS 74  BILITOT 1.8*  PROT 6.6  ALBUMIN 2.1*   No results found for this basename: LIPASE, AMYLASE,  in the last 168 hours No results found for this basename: AMMONIA,  in the last 168 hours CBC:  Recent Labs Lab 12/02/13 2210  12/03/13 1720 12/04/13 0415 12/05/13 0415 12/06/13 0440 12/07/13 0525 12/08/13 0455  WBC 13.2*  < > 10.0 8.3 5.5 4.1 4.4 4.9  NEUTROABS 10.7*  --  8.5*  --   --   --   --   --   HGB 8.9*  < > 9.4* 8.7* 8.9* 8.5* 8.4* 9.1*  HCT 26.6*  < > 28.4* 26.6* 26.2* 26.0* 26.5* 28.7*  MCV 86.9  < > 85.8 86.9 85.6 87.5 88.6 88.6  PLT 51*  < > 36* 32* 35* 23* 21* 20*  < > = values in this interval not  displayed. Cardiac Enzymes:  Recent Labs Lab 12/02/13 2210 12/03/13 0430 12/03/13 0830  TROPONINI <0.30 <0.30 <0.30   BNP (last 3 results)  Recent Labs  11/14/13 0435 11/17/13 0800 11/20/13 0515  PROBNP 2977.0* 9541.0* 3178.0*   CBG: No results found for this basename: GLUCAP,  in the last 168 hours  Recent Results (from the past 240 hour(s))  CMV CULTURE CMVC     Status: None   Collection Time    11/30/13 12:24 PM      Result Value Ref Range Status   Specimen Description MOUTH TONGUE ULCER   Final   Special Requests Immunocompromised   Final   Culture     Final   Value: No Cytomegalovirus identified in cell culture                                                                A negative result does not exclude the possibility of CMV infection;inappropriate specimen collection,storage and transport may lead to false      negative results.     Performed at [facility]   Report Status 12/04/2013 FINAL   Final  FUNGUS CULTURE W SMEAR     Status: None   Collection Time    11/30/13 12:25 PM      Result Value Ref Range Status   Specimen Description ORAL TONGUE ULCER   Final   Special Requests Immunocompromised   Final   Fungal Smear     Final   Value: NO YEAST OR FUNGAL ELEMENTS SEEN     Performed at [facility]   Culture     Final   Value: NO GROWTH 7 DAYS     Performed at [facility]   Report Status 12/07/2013 FINAL   Final  WOUND CULTURE     Status: None   Collection Time    11/30/13 12:25 PM      Result Value Ref Range Status   Specimen Description ORAL TONGUE ULCER   Final   Special Requests Immunocompromised   Final   Gram Stain     Final   Value: NO WBC SEEN     RARE SQUAMOUS EPITHELIAL CELLS PRESENT     MODERATE GRAM POSITIVE RODS     Performed at  [facility]     Final   Value: MULTIPLE ORGANISMS PRESENT, NONE PREDOMINANT     Note: NO STAPHYLOCOCCUS AUREUS ISOLATED NO GROUP A STREP (S.PYOGENES) ISOLATED      Performed at [facility]   Report Status 12/02/2013 FINAL   Final  CULTURE, BLOOD (ROUTINE X 2)     Status: None   Collection Time    12/01/13  8:12 AM      Result Value Ref Range Status   Specimen Description BLOOD LEFT HAND   Final   Special Requests BOTTLES DRAWN AEROBIC AND ANAEROBIC 6 MLS EACH   Final   Culture  Setup Time     Final   Value: 12/01/2013 10:37     Performed at [facility]   Culture     Final   Value: NO GROWTH 5 DAYS     Performed at [facility]   Report Status 12/07/2013 FINAL   Final  CULTURE, BLOOD (ROUTINE X 2)     Status: None   Collection Time    12/01/13  8:15 AM      Result Value Ref Range Status   Specimen Description BLOOD LEFT HAND   Final   Special Requests BOTTLES DRAWN AEROBIC AND ANAEROBIC 3 MLS EACH   Final   Culture  Setup Time     Final   Value: 12/01/2013 10:37     Performed at [facility]   Culture     Final   Value: NO GROWTH 5 DAYS     Performed at [facility]   Report Status 12/07/2013 FINAL   Final  URINE CULTURE     Status: None   Collection Time    12/01/13 11:40 AM      Result Value Ref Range Status   Specimen Description URINE, CATHETERIZED   Final   Special Requests NONE   Final   Culture  Setup Time     Final   Value: 12/01/2013 14:31     Performed at [place]     Final   Value: NO GROWTH     Performed at [facility]   Culture     Final   Value: NO GROWTH     Performed at [facility]   Report Status 12/02/2013 FINAL   Final  CULTURE, EXPECTORATED SPUTUM-ASSESSMENT     Status: None   Collection Time    12/03/13  3:47 AM      Result Value Ref Range Status   Specimen Description SPUTUM   Final   Special Requests Immunocompromised   Final   Sputum evaluation     Final   Value: MICROSCOPIC FINDINGS SUGGEST THAT THIS SPECIMEN IS NOT REPRESENTATIVE OF LOWER RESPIRATORY SECRETIONS. PLEASE RECOLLECT.     RESULTS CALLED TO, READ BACK BY  AND VERIFIED WITH PAT MICKLEY,RN [id number] @ [id number] BY J SCOTTON   Report Status 12/03/2013 FINAL   Final  CLOSTRIDIUM DIFFICILE BY PCR     Status: None   Collection Time    12/06/13 10:51 AM      Result Value Ref Range Status   C difficile by pcr NEGATIVE  NEGATIVE Final   Comment: Performed at [facility]     Studies: No results found.  Scheduled Meds: . DULoxetine  60 mg Oral QPM  . fluticasone  2 spray Each Nare Daily  . hydrocortisone  10 mg Oral BID  .  magnesium oxide  400 mg Oral BID  . pantoprazole  40 mg Oral QHS  . sodium chloride  10-40 mL Intracatheter Q12H  . sodium chloride  3 mL Intravenous Q12H   Continuous Infusions:   Active Problems:   Myelodysplasia   Symptomatic anemia   Adrenal infarction   Acute renal failure   Acute on chronic diastolic heart failure   Altered mental status   Acute-on-chronic respiratory failure   Hypotension, unspecified   Severe sepsis   DVT (deep venous thrombosis)   Pleural effusion   Thrombocytopenia   Hypokalemia    Time spent: 25 minutes     [facility] Pager [phone]. If 7PM-7AM, please contact night-coverage at [email or web address], password [facility] 12/08/2013, 5:46 PM  LOS: 31 days

## 2013-12-08 NOTE — Progress Notes (Signed)
[name] continues to improve. She really sounds stronger. All the edema appears to be gone. She still needs to eat a little better. Hopefully this will continue to happen. We also have to continue to work on getting her stronger.  She is doing well with the Vidaza injections. She is tolerating these. There's no nausea or vomiting.  She did have some diarrhea. We need to see if she is on stool softeners.  She still has this cough. Hopefully, once she gets out of bed more, this will improve.  She has an incredible urine output now. This almost is like a post obstructive diuresis. She now his net out almost 5 L. We have to watch out for this.  Her potassium is low. It is 3.1. Renal function is back to normal. We will replace the potassium.  She's had no bleeding. The pain in the left hip continues to improve.  Vital signs show temperature of 97.8. Pulse 108. Blood pressure 153/85. Head and neck exam shows no ocular or oral lesions. The tongue ulcer continues to heal. No adenopathy in the neck. Lungs are clear bilaterally. She does have some rhonchi bilaterally but. She has good air movement. Cardiac exam tachycardic but regular. Abdomen is soft. It is less distended. Bowel sounds are active. I really cannot palpate any discomfort in the left lower quadrant. Extremities shows no edema in her upper or lower extremities. Skin exam shows some scattered ecchymoses. Neurological exam is nonfocal.  Her hemoglobin is holding steady at 9.1. White cell count is 4.9. Platelet count is 20,000.  Again, we are seeing very nice improvement in her overall status now. Hopefully, we can work real hard on her mobility and strength. I would like to be a to see her go home. However, she may need to go to a rehabilitation facility.  She does have a Foley catheter in. I would to keep this in for now.  Pete E.  Isaiah 41:10

## 2013-12-08 NOTE — Progress Notes (Signed)
Occupational Therapy Treatment Patient Details Name: Lacey [name] MRN: [id number] DOB: 05-Mar-1956 Today's Date: 12/08/2013 Time: 4098-1191 OT Time Calculation (min): 20 min  OT Assessment / Plan / Recommendation  History of present illness      Per chart:   58 y.o. WF PMHx Hodgkin's lymphoma status post chemotherapy in the pas has myelodysplastic syndrome. The patient is coming from home. Patient presents with complaints of nausea vomiting and right-sided abdominal pain. The patient mentions that her symptoms started a month ago with cough and yellowish expectoration for which she completed a course of amoxicillin despite which her cough was not getting better.since last one month she started having dyspnea on exertion which is progressively worsening.denies any orthopnea or PND.   Spontaneous retroperitoneal bleed, now with L quad weakness.    Also, pt was transferred back to ICU due to respiratory issues.  She is back on telemetry at this time.     OT comments  Pt seen for the first time since she got back on floor from ICU.  Tolerated session well.    Follow Up Recommendations  SNF    Barriers to Discharge       Equipment Recommendations  3 in 1 bedside comode    Recommendations for Other Services    Frequency Min 2X/week   Progress towards OT Goals Progress towards OT goals: Progressing toward goals  Plan      Precautions / Restrictions Precautions Precautions: Fall Precaution Comments: L quads  are profoundly weak since retroperitoneal bleed. L LEG WILL BUCKLE Restrictions Weight Bearing Restrictions: No   Pertinent Vitals/Pain LLE--repositioned and requested pain meds.  Pain was not rated    ADL  Transfers/Ambulation Related to ADLs: sat on eob x 8 minutes with occasional min A.  This is the first time pt has sat up in awhile.  No c/o dizziness but pt a little unsteady.  Changed gown and her visitor combed her hair.  rolled to R with min A with rail.  Assist to bend  LLE up.   ADL Comments: reviewed theraband for horizontal abduction and shoulder flexion    OT Diagnosis:    OT Problem List:   OT Treatment Interventions:     OT Goals(current goals can now be found in the care plan section)    Visit Information  Last OT Received On: 12/08/13 Assistance Needed: +2 (+1 for eob) History of Present Illness: 58 y.o. WF PMHx Hodgkin's lymphoma status post chemotherapy in the pas has myelodysplastic syndrome. The patient is coming from home. Patient presents with complaints of nausea vomiting and right-sided abdominal pain. The patient mentions that her symptoms started a month ago with cough and yellowish expectoration for which she completed a course of amoxicillin despite which her cough was not getting better.since last one month she started having dyspnea on exertion which is progressively worsening.denies any orthopnea or PND.   Spontaneous retroperitoneal bleed, now with L quad weakness.    Subjective Data      Prior Functioning       Cognition  Cognition Arousal/Alertness: Awake/alert Behavior During Therapy: WFL for tasks assessed/performed Overall Cognitive Status: Within Functional Limits for tasks assessed--mostly.  Pt with decreased memory   Mobility  Bed Mobility Bed Mobility: Sidelying to Sit Sidelying to sit: Max assist General bed mobility comments: assist for legs and trunk    Exercises      Balance    End of Session OT - End of Session Activity Tolerance: Patient tolerated treatment well  Patient left: in bed;with call bell/phone within reach;with family/visitor present  [place] 12/08/2013, 1:45 PM [name], OTR/L [phone] 12/08/2013

## 2013-12-08 NOTE — Progress Notes (Signed)
PT Cancellation Note  Patient Details Name: Lacey [name] MRN: [id number] DOB: 1955-10-22   Cancelled Treatment:    Reason Eval/Treat Not Completed: Fatigue/lethargy limiting ability to participate Pt reports increased fatigue from working with OT as well as increased pain, just requested pain meds from RN.  Pt agreeable to perform LE exercises with visitor/family in room after receiving pain meds.  Recommended supine heel slides, ankle pumps, hip abduction, and quad sets as tolerated and demonstrated.  Pt aware goal of next visit will be OOB.   Sasan Wilkie,KATHrine E 12/08/2013, 3:00 PM Carmelia Bake, PT, DPT 12/08/2013 Pager: [phone]

## 2013-12-08 NOTE — Progress Notes (Signed)
This patient is receiving Protonix. Based on criteria approved by the Pharmacy and Therapeutics Committee, this medication is being converted to the equivalent oral dose form. These criteria include:   . The patient is eating (either orally or per tube) and/or has been taking other orally administered medications for at least 24 hours.  . This patient has no evidence of active gastrointestinal bleeding or impaired GI absorption (gastrectomy, short bowel, patient on TNA or NPO).   If you have questions about this conversion, please contact the pharmacy department.  Kara Mead, [facility] 12/08/2013 8:36 AM

## 2013-12-09 LAB — COMPREHENSIVE METABOLIC PANEL
ALK PHOS: 63 U/L (ref 39–117)
ALT: 8 U/L (ref 0–35)
AST: 9 U/L (ref 0–37)
Albumin: 2.3 g/dL — ABNORMAL LOW (ref 3.5–5.2)
BUN: 17 mg/dL (ref 6–23)
CO2: 31 mEq/L (ref 19–32)
Calcium: 8.7 mg/dL (ref 8.4–10.5)
Chloride: 97 mEq/L (ref 96–112)
Creatinine, Ser: 0.93 mg/dL (ref 0.50–1.10)
GFR calc non Af Amer: 66 mL/min — ABNORMAL LOW (ref >90)
GFR, EST AFRICAN AMERICAN: 76 mL/min — AB (ref >90)
GLUCOSE: 101 mg/dL — AB (ref 70–99)
POTASSIUM: 3.6 meq/L — AB (ref 3.7–5.3)
SODIUM: 136 meq/L — AB (ref 137–147)
TOTAL PROTEIN: 6.7 g/dL (ref 6.0–8.3)
Total Bilirubin: 0.9 mg/dL (ref 0.3–1.2)

## 2013-12-09 LAB — LACTATE DEHYDROGENASE: LDH: 314 U/L — AB (ref 94–250)

## 2013-12-09 LAB — CBC WITH DIFFERENTIAL/PLATELET
BASOS ABS: 0 10*3/uL (ref 0.0–0.1)
Basophils Relative: 1 % (ref 0–1)
EOS ABS: 0.4 10*3/uL (ref 0.0–0.7)
Eosinophils Relative: 6 % — ABNORMAL HIGH (ref 0–5)
HEMATOCRIT: 25.7 % — AB (ref 36.0–46.0)
HEMOGLOBIN: 8.6 g/dL — AB (ref 12.0–15.0)
Lymphocytes Relative: 31 % (ref 12–46)
Lymphs Abs: 2 10*3/uL (ref 0.7–4.0)
MCH: 29 pg (ref 26.0–34.0)
MCHC: 33.5 g/dL (ref 30.0–36.0)
MCV: 86.5 fL (ref 78.0–100.0)
MONO ABS: 0.4 10*3/uL (ref 0.1–1.0)
MONOS PCT: 6 % (ref 3–12)
NEUTROS ABS: 3.6 10*3/uL (ref 1.7–7.7)
Neutrophils Relative %: 57 % (ref 43–77)
Platelets: 15 10*3/uL — CL (ref 150–400)
RBC: 2.97 MIL/uL — ABNORMAL LOW (ref 3.87–5.11)
RDW: 18.4 % — AB (ref 11.5–15.5)
WBC Morphology: INCREASED
WBC: 6.3 10*3/uL (ref 4.0–10.5)

## 2013-12-09 LAB — RETICULOCYTES
RBC.: 2.97 MIL/uL — ABNORMAL LOW (ref 3.87–5.11)
Retic Ct Pct: <0.4 % — ABNORMAL LOW (ref 0.4–3.1)

## 2013-12-09 MED ORDER — AZACITIDINE CHEMO SQ INJECTION
75.0000 mg/m2 | Freq: Once | INTRAMUSCULAR | Status: AC
  Administered 2013-12-09: 145 mg via SUBCUTANEOUS
  Filled 2013-12-09: qty 5.8

## 2013-12-09 MED ORDER — ONDANSETRON HCL 8 MG PO TABS
8.0000 mg | ORAL_TABLET | Freq: Once | ORAL | Status: AC
  Administered 2013-12-09: 8 mg via ORAL
  Filled 2013-12-09: qty 1

## 2013-12-09 MED ORDER — POTASSIUM CHLORIDE 10 MEQ/100ML IV SOLN
10.0000 meq | INTRAVENOUS | Status: AC
  Administered 2013-12-09 (×4): 10 meq via INTRAVENOUS
  Filled 2013-12-09 (×4): qty 100

## 2013-12-09 NOTE — Progress Notes (Signed)
[facility] PROGRESS NOTE  Lacey [name] QPY:[id number] DOB: 1956/04/10 DOA: 11/17/2013 PCP: Vena Austria, MD  Brief summary  58 y.o. female with history of Hodgkin's lymphoma status post chemotherapy now with myelodysplastic syndrome who initially presented with abdominal pain, nausea, vomiting, hypotension and complicated with adrenal infarct, adrenal insufficiency, HCAP, recurrent sepsis, hypotension RNF<-->ICU/pressure support+IV atx;  -She also developed hemorrhage into her left iliopsoas muscle which caused weakness and pain of her left leg and LLQ. -Dr. Marin Olp is determining what treatment to initiate for her MDS as she has required frequent transfusions.   Assessment/Plan  1. Myelodysplastic syndrome in setting of Hodgkin Lymphoma:  -Refractory anemia and thrombocytopenia requiring almost daily blood product transfusions, Although for the past 3 days counts appear to be stable.  Bone Marrow Biopsy: HYPERCELLULAR BONE MARROW FOR AGE WITH DYSPOIETIC CHANGES. -myelodysplastic/myeloproliferative process, particularly chronic myelomonocytic leukemia - Appreciate Dr Jonette Eva assistance. on frequent daily transfusions (PRBC+platelets) as patient is developing antibodies. Per hem/onc to start Vidaza on 3/11 -3/14: Plt down to 15. Watch closely for need for transfusion.  2. Recurrent fever/septic shock, pneumonia Likely due dysfunction leukocytes as above  -CT chest on 2/23: Persistent consolidation RML with new peribronchovascular opacities suggestive of bronchopneumonia. Underwent bronch on 2/26; BCx NGTD,  Diff PCR neg, BAL: PCP neg, Bacterial, fungal, and AFB cultures NGTD  - 3/6: febrile; CT: Persistent right upper lobe collapse/ consolidation with interval development of central airspace disease in the left upper lobe  - 3/6: restarted IV atx, antifungals, acyclovir  -off pressors; clinically improving, started taper IV steroids 3/10  -3/11: PICC line is out. Will stop  all antibiotics and antifungals as all cx data has been negative to date. Hopefully fevers and "sepsis" will not recur.  3. Acute on chronic diastolic heart failure due to IVF from resuscitation. Echo (2/15): Preserved EF and grade 1 diastolic heart failure -R sided pleural effusion; anasarca, cont IV lasix; Daily weights and strict I/O; monitor renal function -per pulmonology deferred thoracentesis due to thrombocytopenia, R sided pleural effusion    4. 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 -Will decrease hydrocortisone from 50 mg IV q8 to q12, with hope of transitioning to PO over the next couple days.    5. Acute Renal Failure due to septic shock; RUS wnl. Hypervolemic  - resumed lasix due to respiratory status; close monitor   6. Spontaneous retroperitoneal bleed in setting of thrombocytopenia; hemorrhage into her left iliopsoas muscle which caused weakness and pain of her left leg and LLQ - on fentanyl; cont pain control  -Resume PT/OT.  7. Right upper extremity superficial thrombosis, stable   8. Transaminitis due to septic shock, resolved.   9. Urge and stress incontinence; Foley placed for comfort   10. Tongue ulcer; cont as above; biopsied by ENT; path: SQUAMOUS EPITHELIUM. NO DYSPLASIA OR MALIGNANCY -improving   11. Hypokalemia -Replete PO. -Mg 1.6 (low-normal); will place on PO mag supplementation as well.    Proph: Hold for thrombocytopenia, spont retroperitoneal bleed   Code Status: full  Family Communication: patient alone,  Disposition Plan: Will need SNF. Patient agrees. SW consult requested.  Consultants:  Dr. Burney Gauze (oncology)  Dr Gibson Ramp (Surgery) curbside consult  Dr. Carlyle Basques (infectious disease)    Procedures:  11/12/2013 right upper extremity Doppler  No evidence of deep vein thrombosis involving the right upper extremity, left subclavian vein, and left  internal jugular vein. There is superficial thrombosis noted in  the right cephalic vein.  Old PICC removed 3/5; Left-sided PICC 03/06 >>>  Foley 3/2 >>>   -2/12 Bone marrow biopsy report pending  Echocardiogram 01/10/2013  - Left ventricle: mild focal basal hypertrophy of the septum.  -LVEF= ejection fraction was 55%. Wall motion was normal; there were no regional wall motion abnormalities.  -(grade 1 diastolic dysfunction).  - Aortic valve: There was no stenosis. Trivial regurgitation. - Mitral valve: Trivial regurgitation. - Left atrium: The atrium was mildly dilated. - Right ventricle: The cavity size was normal. Systolic function was normal. - Tricuspid valve: Peak RV-RA gradient: 49mm Hg (S). - Pulmonary arteries: PA peak pressure: 35mm Hg (S). - Inferior vena cava: The vessel was normal in size; the respirophasic diameter changes were in the normal range (=50%); findings are consistent with normal central venous pressure.  Antibiotics:  None at present  2/15 Upper extremity Dopplers demonstrated acute clot of the right cephalic vein  0/76 Chest CT demonstrated loculated right pleural effusion which does not appear to have intact  2/17 Echocardiogram demonstrates EF of 22-63% with diastolic dysfunction.  2/24 Abdominal CT demonstrated hemorrhage in the left iliacus muscle  2/23 Chest CT demonstrated persistent right pleural effusion and right middle lobe density  2/25 Head CT demonstrated sphenoid sinusitis  2/26 Bronchoscopy did not reveal an infectious organism  3/3 Lower extremity Dopplers did not detect DVTs  3/6 Chest CT> persistent collapse of the RML, RLL with a large pleural effusion. Development of a new left airspace opacity.  3/8 TTE >>> Ef 65%, no RWMA, PAP 35 torr  3/8 Abdomen CT >>> No findings consistent with sepsis, evolution of L iliac hematoma  3/8 [place] R Effusion>>>small in volume, loculated & complex appearing by [place], NO [place]   Consultants:  Oncology   HPI/Subjective: alert  Objective: Filed Vitals:   12/09/13 1500  BP:   Pulse:   Temp: 99.4 F (37.4 C)  Resp:     Intake/Output Summary (Last 24 hours) at 12/09/13 1525 Last data filed at 12/09/13 1344  Gross per 24 hour  Intake    962 ml  Output   2425 ml  Net  -1463 ml   Filed Weights   12/07/13 0521 12/08/13 0622 12/09/13 0503  Weight: 86.8 kg (191 lb 5.8 oz) 85.1 kg (187 lb 9.8 oz) 85.2 kg (187 lb 13.3 oz)    Exam:   General:  alert  Cardiovascular: s1,s2 rrr  Respiratory: few LL crackles   Abdomen: soft, nt, nd   Musculoskeletal: LE edema   Data Reviewed: Basic Metabolic Panel:  Recent Labs Lab 12/02/13 2210  12/05/13 0415 12/06/13 0440 12/07/13 0525 12/08/13 0455 12/09/13 0820  NA 141  < > 142 143 139 137 136*  K 3.9  < > 3.2* 2.9* 2.3* 3.1* 3.6*  CL 100  < > 100 99 94* 91* 97  CO2 28  < > 31 33* 35* 36* 31  GLUCOSE 91  < > 143* 130* 96 138* 101*  BUN 15  < > 25* 25* $Remov'22 19 17  'wdvUMQ$ CREATININE 1.62*  < > 1.49* 1.21* 0.94 0.95 0.93  CALCIUM 8.8  < > 7.9* 7.9* 7.9* 8.7 8.7  MG 1.8  --   --   --  1.6  --   --   PHOS 4.3  --   --   --   --   --   --   < > = values in this interval not displayed. Liver Function Tests:  Recent  Labs Lab 12/02/13 2210 12/09/13 0820  AST 18 9  ALT 17 8  ALKPHOS 74 63  BILITOT 1.8* 0.9  PROT 6.6 6.7  ALBUMIN 2.1* 2.3*   No results found for this basename: LIPASE, AMYLASE,  in the last 168 hours No results found for this basename: AMMONIA,  in the last 168 hours CBC:  Recent Labs Lab 12/02/13 2210  12/03/13 1720  12/05/13 0415 12/06/13 0440 12/07/13 0525 12/08/13 0455 12/09/13 0820  WBC 13.2*  < > 10.0  < > 5.5 4.1 4.4 4.9 6.3  NEUTROABS 10.7*  --  8.5*  --   --   --   --   --  3.6  HGB 8.9*  < > 9.4*  < > 8.9* 8.5* 8.4* 9.1* 8.6*  HCT 26.6*  < > 28.4*  < > 26.2* 26.0* 26.5* 28.7* 25.7*  MCV 86.9  < > 85.8  < > 85.6 87.5 88.6 88.6 86.5  PLT 51*  < > 36*  < > 35* 23* 21* 20* 15*  < > = values in this  interval not displayed. Cardiac Enzymes:  Recent Labs Lab 12/02/13 2210 12/03/13 0430 12/03/13 0830  TROPONINI <0.30 <0.30 <0.30   BNP (last 3 results)  Recent Labs  11/14/13 0435 11/17/13 0800 11/20/13 0515  PROBNP 2977.0* 9541.0* 3178.0*   CBG: No results found for this basename: GLUCAP,  in the last 168 hours  Recent Results (from the past 240 hour(s))  CMV CULTURE CMVC     Status: None   Collection Time    11/30/13 12:24 PM      Result Value Ref Range Status   Specimen Description MOUTH TONGUE ULCER   Final   Special Requests Immunocompromised   Final   Culture     Final   Value: No Cytomegalovirus identified in cell culture                                                                A negative result does not exclude the possibility of CMV infection;inappropriate specimen collection,storage and transport may lead to false      negative results.     Performed at [facility]   Report Status 12/04/2013 FINAL   Final  FUNGUS CULTURE W SMEAR     Status: None   Collection Time    11/30/13 12:25 PM      Result Value Ref Range Status   Specimen Description ORAL TONGUE ULCER   Final   Special Requests Immunocompromised   Final   Fungal Smear     Final   Value: NO YEAST OR FUNGAL ELEMENTS SEEN     Performed at [facility]   Culture     Final   Value: NO GROWTH 7 DAYS     Performed at [facility]   Report Status 12/07/2013 FINAL   Final  WOUND CULTURE     Status: None   Collection Time    11/30/13 12:25 PM      Result Value Ref Range Status   Specimen Description ORAL TONGUE ULCER   Final   Special Requests Immunocompromised   Final   Gram Stain     Final   Value: NO WBC SEEN     RARE SQUAMOUS  EPITHELIAL CELLS PRESENT     MODERATE GRAM POSITIVE RODS     Performed at [facility]   Culture     Final   Value: MULTIPLE ORGANISMS PRESENT, NONE PREDOMINANT     Note: NO STAPHYLOCOCCUS AUREUS ISOLATED NO GROUP A STREP (S.PYOGENES)  ISOLATED     Performed at [facility]   Report Status 12/02/2013 FINAL   Final  CULTURE, BLOOD (ROUTINE X 2)     Status: None   Collection Time    12/01/13  8:12 AM      Result Value Ref Range Status   Specimen Description BLOOD LEFT HAND   Final   Special Requests BOTTLES DRAWN AEROBIC AND ANAEROBIC 6 MLS EACH   Final   Culture  Setup Time     Final   Value: 12/01/2013 10:37     Performed at [facility]   Culture     Final   Value: NO GROWTH 5 DAYS     Performed at [facility]   Report Status 12/07/2013 FINAL   Final  CULTURE, BLOOD (ROUTINE X 2)     Status: None   Collection Time    12/01/13  8:15 AM      Result Value Ref Range Status   Specimen Description BLOOD LEFT HAND   Final   Special Requests BOTTLES DRAWN AEROBIC AND ANAEROBIC 3 MLS EACH   Final   Culture  Setup Time     Final   Value: 12/01/2013 10:37     Performed at [facility]   Culture     Final   Value: NO GROWTH 5 DAYS     Performed at [facility]   Report Status 12/07/2013 FINAL   Final  URINE CULTURE     Status: None   Collection Time    12/01/13 11:40 AM      Result Value Ref Range Status   Specimen Description URINE, CATHETERIZED   Final   Special Requests NONE   Final   Culture  Setup Time     Final   Value: 12/01/2013 14:31     Performed at [facility]     Final   Value: NO GROWTH     Performed at [facility]   Culture     Final   Value: NO GROWTH     Performed at [facility]   Report Status 12/02/2013 FINAL   Final  CULTURE, EXPECTORATED SPUTUM-ASSESSMENT     Status: None   Collection Time    12/03/13  3:47 AM      Result Value Ref Range Status   Specimen Description SPUTUM   Final   Special Requests Immunocompromised   Final   Sputum evaluation     Final   Value: MICROSCOPIC FINDINGS SUGGEST THAT THIS SPECIMEN IS NOT REPRESENTATIVE OF LOWER RESPIRATORY SECRETIONS. PLEASE RECOLLECT.     RESULTS CALLED TO,  READ BACK BY AND VERIFIED WITH PAT MICKLEY,RN [id number] @ [id number] BY J SCOTTON   Report Status 12/03/2013 FINAL   Final  CLOSTRIDIUM DIFFICILE BY PCR     Status: None   Collection Time    12/06/13 10:51 AM      Result Value Ref Range Status   C difficile by pcr NEGATIVE  NEGATIVE Final   Comment: Performed at [facility]     Studies: No results found.  Scheduled Meds: . DULoxetine  60 mg Oral QPM  .  fluticasone  2 spray Each Nare Daily  . hydrocortisone  10 mg Oral BID  . magnesium oxide  400 mg Oral BID  . pantoprazole  40 mg Oral QHS  . potassium chloride  10 mEq Intravenous Q1 Hr x 4  . sodium chloride  10-40 mL Intracatheter Q12H  . sodium chloride  3 mL Intravenous Q12H   Continuous Infusions:   Active Problems:   Myelodysplasia   Symptomatic anemia   Adrenal infarction   Acute renal failure   Acute on chronic diastolic heart failure   Altered mental status   Acute-on-chronic respiratory failure   Hypotension, unspecified   Severe sepsis   DVT (deep venous thrombosis)   Pleural effusion   Thrombocytopenia   Hypokalemia    Time spent: 25 minutes     [facility] Pager [phone]. If 7PM-7AM, please contact night-coverage at [email or web address], password [facility] 12/09/2013, 3:25 PM  LOS: 32 days

## 2013-12-09 NOTE — Progress Notes (Signed)
PT Cancellation Note  ___Treatment cancelled today due to medical issues with patient which prohibited therapy  ___ Treatment cancelled today due to patient receiving procedure or test   ___ Treatment cancelled today due to patient's refusal to participate   _X_ Treatment cancelled today due to having vomited this am and overall feeling "really bad" stated spouse  Rica Koyanagi  PTA [facility]  Acute  Rehab Pager      [phone]

## 2013-12-09 NOTE — Progress Notes (Signed)
Lacey [name] is doing well. She is time to do physical therapy and occupational therapy. I really appreciate the help from PT OT. This would be the best thing for her to try to get her out of the hospital.  She is stil diuresing like crazy. She now is net out over 8 L. I think her Lasix was stopped.  She might he did a little better.  She is doing well with the [place]. She's had 3 subcutaneous injections.  She's not having as much cough. She's having no nausea vomiting. She's not hurting as much in the left hip. There's been no bleeding. He's had no fever. She's off IV antibiotics. Her tongue ulcer continues to heal.  Her vital signs look good. Her temperature is 98.2. Pulse 12. Blood pressure 143/60. Head and neck exam shows no ocular or oral lesions. Leg ulcer on the right side of the tongue continues to heal. No mucositis is noted. Lungs are with improved breath sounds. There is still some rhonchi bilaterally. Cardiac exam is minimally Tachycardic. No murmurs. Abdomen soft. Good bowel sounds. No fluid wave. No palpable tenderness in the lower quadrant. Extremities shows no edema. She may have a slowly better movement of the left leg.  Labs are pending.  Continue the subcutaneous Vidaza.  Possibly check another chest x-ray in 2 or 3 days.  Possibly taper down the hydrocortisone.  Nutrition and a physical/occupational therapy will be the key components now in trying to to get her home.  I really appreciate all the care that she is getting from all the hospitalist and the staff on 4 E.  Pete E.  Romans 8:28

## 2013-12-10 DIAGNOSIS — D61818 Other pancytopenia: Secondary | ICD-10-CM

## 2013-12-10 LAB — COMPREHENSIVE METABOLIC PANEL
ALK PHOS: 67 U/L (ref 39–117)
ALT: 9 U/L (ref 0–35)
AST: 10 U/L (ref 0–37)
Albumin: 2.4 g/dL — ABNORMAL LOW (ref 3.5–5.2)
BILIRUBIN TOTAL: 0.8 mg/dL (ref 0.3–1.2)
BUN: 15 mg/dL (ref 6–23)
CHLORIDE: 96 meq/L (ref 96–112)
CO2: 24 meq/L (ref 19–32)
Calcium: 8.8 mg/dL (ref 8.4–10.5)
Creatinine, Ser: 0.99 mg/dL (ref 0.50–1.10)
GFR, EST AFRICAN AMERICAN: 71 mL/min — AB (ref >90)
GFR, EST NON AFRICAN AMERICAN: 61 mL/min — AB (ref >90)
GLUCOSE: 114 mg/dL — AB (ref 70–99)
POTASSIUM: 4.5 meq/L (ref 3.7–5.3)
SODIUM: 132 meq/L — AB (ref 137–147)
Total Protein: 6.9 g/dL (ref 6.0–8.3)

## 2013-12-10 LAB — CBC WITH DIFFERENTIAL/PLATELET
BASOS ABS: 0.1 10*3/uL (ref 0.0–0.1)
Basophils Relative: 1 % (ref 0–1)
EOS ABS: 0.4 10*3/uL (ref 0.0–0.7)
Eosinophils Relative: 5 % (ref 0–5)
HCT: 24.3 % — ABNORMAL LOW (ref 36.0–46.0)
Hemoglobin: 7.8 g/dL — ABNORMAL LOW (ref 12.0–15.0)
LYMPHS PCT: 28 % (ref 12–46)
Lymphs Abs: 2 10*3/uL (ref 0.7–4.0)
MCH: 28.3 pg (ref 26.0–34.0)
MCHC: 32.1 g/dL (ref 30.0–36.0)
MCV: 88 fL (ref 78.0–100.0)
Monocytes Absolute: 0.5 10*3/uL (ref 0.1–1.0)
Monocytes Relative: 7 % (ref 3–12)
NEUTROS PCT: 59 % (ref 43–77)
Neutro Abs: 4.2 10*3/uL (ref 1.7–7.7)
Platelets: 14 10*3/uL — CL (ref 150–400)
RBC: 2.76 MIL/uL — ABNORMAL LOW (ref 3.87–5.11)
RDW: 18.3 % — AB (ref 11.5–15.5)
WBC: 7.2 10*3/uL (ref 4.0–10.5)

## 2013-12-10 LAB — RETICULOCYTES
RBC.: 2.76 MIL/uL — ABNORMAL LOW (ref 3.87–5.11)
Retic Ct Pct: <0.4 % — ABNORMAL LOW (ref 0.4–3.1)

## 2013-12-10 LAB — LACTATE DEHYDROGENASE: LDH: 287 U/L — AB (ref 94–250)

## 2013-12-10 MED ORDER — DIPHENHYDRAMINE HCL 25 MG PO CAPS
25.0000 mg | ORAL_CAPSULE | Freq: Once | ORAL | Status: AC
  Administered 2013-12-10: 25 mg via ORAL
  Filled 2013-12-10: qty 1

## 2013-12-10 MED ORDER — DIPHENHYDRAMINE-ZINC ACETATE 2-0.1 % EX CREA
TOPICAL_CREAM | Freq: Three times a day (TID) | CUTANEOUS | Status: DC | PRN
  Administered 2013-12-11: 16:00:00 via TOPICAL
  Administered 2013-12-16: 1 via TOPICAL
  Filled 2013-12-10: qty 28

## 2013-12-10 NOTE — Progress Notes (Signed)
[facility] PROGRESS NOTE  FREDDI FORSTER JAS:[id number] DOB: 1956-06-25 DOA: 11/05/2013 PCP: Vena Austria, MD  Brief summary  58 y.o. female with history of Hodgkin's lymphoma status post chemotherapy now with myelodysplastic syndrome who initially presented with abdominal pain, nausea, vomiting, hypotension and complicated with adrenal infarct, adrenal insufficiency, HCAP, recurrent sepsis, hypotension RNF<-->ICU/pressure support+IV atx;  -She also developed hemorrhage into her left iliopsoas muscle which caused weakness and pain of her left leg and LLQ. -Dr. Marin Olp is determining what treatment to initiate for her MDS as she has required frequent transfusions.   Assessment/Plan  1. Myelodysplastic syndrome in setting of Hodgkin Lymphoma:  -Refractory anemia and thrombocytopenia requiring almost daily blood product transfusions, Although for the past 3 days counts appear to be stable.  Bone Marrow Biopsy: HYPERCELLULAR BONE MARROW FOR AGE WITH DYSPOIETIC CHANGES. -myelodysplastic/myeloproliferative process, particularly chronic myelomonocytic leukemia - Appreciate Dr Jonette Eva assistance. on frequent daily transfusions (PRBC+platelets) as patient is developing antibodies. Per hem/onc to start Vidaza on 3/11 -3/14: Plt down to 15. Watch closely for need for transfusion (if <10 or active bleeding). -3/15: Hb down to 7.8.  2. Recurrent fever/septic shock, pneumonia Likely due dysfunction leukocytes as above  -CT chest on 2/23: Persistent consolidation RML with new peribronchovascular opacities suggestive of bronchopneumonia. Underwent bronch on 2/26; BCx NGTD,  Diff PCR neg, BAL: PCP neg, Bacterial, fungal, and AFB cultures NGTD  - 3/6: febrile; CT: Persistent right upper lobe collapse/ consolidation with interval development of central airspace disease in the left upper lobe  - 3/6: restarted IV atx, antifungals, acyclovir  -off pressors; clinically improving, started taper IV  steroids 3/10  -3/11: PICC line is out. Will stop all antibiotics and antifungals as all cx data has been negative to date. Hopefully fevers and "sepsis" will not recur.  3. Acute on chronic diastolic heart failure due to IVF from resuscitation. Echo (2/15): Preserved EF and grade 1 diastolic heart failure -R sided pleural effusion; anasarca, cont IV lasix; Daily weights and strict I/O; monitor renal function -per pulmonology deferred thoracentesis due to thrombocytopenia, R sided pleural effusion    4. 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 -On PO hydrocortisone.  5. Acute Renal Failure due to septic shock; RUS wnl. Hypervolemic  - resumed lasix due to respiratory status; close monitor   6. Spontaneous retroperitoneal bleed in setting of thrombocytopenia; hemorrhage into her left iliopsoas muscle which caused weakness and pain of her left leg and LLQ - on fentanyl; cont pain control  -Resume PT/OT.  7. Right upper extremity superficial thrombosis, stable   8. Transaminitis due to septic shock, resolved.   9. Urge and stress incontinence; Foley placed for comfort   10. Tongue ulcer; cont as above; biopsied by ENT; path: SQUAMOUS EPITHELIUM. NO DYSPLASIA OR MALIGNANCY -improving   11. Hypokalemia -Replete PO. -Mg 1.6 (low-normal); will place on PO mag supplementation as well.    Proph: Hold for thrombocytopenia, spont retroperitoneal bleed   Code Status: full  Family Communication: patient alone,  Disposition Plan: Will need SNF. Patient agrees. SW consult requested.  Consultants:  Dr. Burney Gauze (oncology)  Dr Gibson Ramp (Surgery) curbside consult  Dr. Carlyle Basques (infectious disease)    Procedures:  11/12/2013 right upper extremity Doppler  No evidence of deep vein thrombosis involving the right upper extremity, left subclavian vein, and left internal jugular vein. There is superficial thrombosis  noted in the right cephalic vein.  Old PICC removed 3/5; Left-sided  PICC 03/06 >>>  Foley 3/2 >>>   -2/12 Bone marrow biopsy report pending  Echocardiogram 01/10/2013  - Left ventricle: mild focal basal hypertrophy of the septum.  -LVEF= ejection fraction was 55%. Wall motion was normal; there were no regional wall motion abnormalities.  -(grade 1 diastolic dysfunction).  - Aortic valve: There was no stenosis. Trivial regurgitation. - Mitral valve: Trivial regurgitation. - Left atrium: The atrium was mildly dilated. - Right ventricle: The cavity size was normal. Systolic function was normal. - Tricuspid valve: Peak RV-RA gradient: 55mm Hg (S). - Pulmonary arteries: PA peak pressure: 57mm Hg (S). - Inferior vena cava: The vessel was normal in size; the respirophasic diameter changes were in the normal range (=50%); findings are consistent with normal central venous pressure.  Antibiotics:  None at present  2/15 Upper extremity Dopplers demonstrated acute clot of the right cephalic vein  3/26 Chest CT demonstrated loculated right pleural effusion which does not appear to have intact  2/17 Echocardiogram demonstrates EF of 71-24% with diastolic dysfunction.  2/24 Abdominal CT demonstrated hemorrhage in the left iliacus muscle  2/23 Chest CT demonstrated persistent right pleural effusion and right middle lobe density  2/25 Head CT demonstrated sphenoid sinusitis  2/26 Bronchoscopy did not reveal an infectious organism  3/3 Lower extremity Dopplers did not detect DVTs  3/6 Chest CT> persistent collapse of the RML, RLL with a large pleural effusion. Development of a new left airspace opacity.  3/8 TTE >>> Ef 65%, no RWMA, PAP 35 torr  3/8 Abdomen CT >>> No findings consistent with sepsis, evolution of L iliac hematoma  3/8 [place] R Effusion>>>small in volume, loculated & complex appearing by [place], NO [place]   Consultants:  Oncology  HPI/Subjective: alert  Objective: Filed Vitals:    12/10/13 1248  BP: 107/80  Pulse: 119  Temp:   Resp: 18    Intake/Output Summary (Last 24 hours) at 12/10/13 1612 Last data filed at 12/10/13 1247  Gross per 24 hour  Intake    360 ml  Output   1050 ml  Net   -690 ml   Filed Weights   12/08/13 0622 12/09/13 0503 12/10/13 0437  Weight: 85.1 kg (187 lb 9.8 oz) 85.2 kg (187 lb 13.3 oz) 78.6 kg (173 lb 4.5 oz)    Exam:   General:  alert  Cardiovascular: s1,s2 rrr  Respiratory: few LL crackles   Abdomen: soft, nt, nd   Musculoskeletal: LE edema   Data Reviewed: Basic Metabolic Panel:  Recent Labs Lab 12/06/13 0440 12/07/13 0525 12/08/13 0455 12/09/13 0820 12/10/13 0421  NA 143 139 137 136* 132*  K 2.9* 2.3* 3.1* 3.6* 4.5  CL 99 94* 91* 97 96  CO2 33* 35* 36* 31 24  GLUCOSE 130* 96 138* 101* 114*  BUN 25* $Remov'22 19 17 15  'CFapuA$ CREATININE 1.21* 0.94 0.95 0.93 0.99  CALCIUM 7.9* 7.9* 8.7 8.7 8.8  MG  --  1.6  --   --   --    Liver Function Tests:  Recent Labs Lab 12/09/13 0820 12/10/13 0421  AST 9 10  ALT 8 9  ALKPHOS 63 67  BILITOT 0.9 0.8  PROT 6.7 6.9  ALBUMIN 2.3* 2.4*   No results found for this basename: LIPASE, AMYLASE,  in the last 168 hours No results found for this basename: AMMONIA,  in the last 168 hours CBC:  Recent Labs Lab 12/03/13 1720  12/06/13 0440 12/07/13 0525 12/08/13 0455 12/09/13 0820 12/10/13 0421  WBC 10.0  < >  4.1 4.4 4.9 6.3 7.2  NEUTROABS 8.5*  --   --   --   --  3.6 4.2  HGB 9.4*  < > 8.5* 8.4* 9.1* 8.6* 7.8*  HCT 28.4*  < > 26.0* 26.5* 28.7* 25.7* 24.3*  MCV 85.8  < > 87.5 88.6 88.6 86.5 88.0  PLT 36*  < > 23* 21* 20* 15* 14*  < > = values in this interval not displayed. Cardiac Enzymes: No results found for this basename: CKTOTAL, CKMB, CKMBINDEX, TROPONINI,  in the last 168 hours BNP (last 3 results)  Recent Labs  11/14/13 0435 11/17/13 0800 11/20/13 0515  PROBNP 2977.0* 9541.0* 3178.0*   CBG: No results found for this basename: GLUCAP,  in the last 168  hours  Recent Results (from the past 240 hour(s))  CULTURE, BLOOD (ROUTINE X 2)     Status: None   Collection Time    12/01/13  8:12 AM      Result Value Ref Range Status   Specimen Description BLOOD LEFT HAND   Final   Special Requests BOTTLES DRAWN AEROBIC AND ANAEROBIC 6 MLS EACH   Final   Culture  Setup Time     Final   Value: 12/01/2013 10:37     Performed at [facility]   Culture     Final   Value: NO GROWTH 5 DAYS     Performed at [facility]   Report Status 12/07/2013 FINAL   Final  CULTURE, BLOOD (ROUTINE X 2)     Status: None   Collection Time    12/01/13  8:15 AM      Result Value Ref Range Status   Specimen Description BLOOD LEFT HAND   Final   Special Requests BOTTLES DRAWN AEROBIC AND ANAEROBIC 3 MLS EACH   Final   Culture  Setup Time     Final   Value: 12/01/2013 10:37     Performed at [facility]   Culture     Final   Value: NO GROWTH 5 DAYS     Performed at [facility]   Report Status 12/07/2013 FINAL   Final  URINE CULTURE     Status: None   Collection Time    12/01/13 11:40 AM      Result Value Ref Range Status   Specimen Description URINE, CATHETERIZED   Final   Special Requests NONE   Final   Culture  Setup Time     Final   Value: 12/01/2013 14:31     Performed at [facility]     Final   Value: NO GROWTH     Performed at [facility]   Culture     Final   Value: NO GROWTH     Performed at [facility]   Report Status 12/02/2013 FINAL   Final  CULTURE, EXPECTORATED SPUTUM-ASSESSMENT     Status: None   Collection Time    12/03/13  3:47 AM      Result Value Ref Range Status   Specimen Description SPUTUM   Final   Special Requests Immunocompromised   Final   Sputum evaluation     Final   Value: MICROSCOPIC FINDINGS SUGGEST THAT THIS SPECIMEN IS NOT REPRESENTATIVE OF LOWER RESPIRATORY SECRETIONS. PLEASE RECOLLECT.     RESULTS CALLED TO, READ BACK BY AND VERIFIED WITH PAT  MICKLEY,RN [id number] @ 2355 BY J SCOTTON   Report Status 12/03/2013 FINAL   Final  CLOSTRIDIUM DIFFICILE BY PCR     Status: None   Collection Time    12/06/13 10:51 AM      Result Value Ref Range Status   C difficile by pcr NEGATIVE  NEGATIVE Final   Comment: Performed at [facility]     Studies: No results found.  Scheduled Meds: . DULoxetine  60 mg Oral QPM  . fluticasone  2 spray Each Nare Daily  . hydrocortisone  10 mg Oral BID  . magnesium oxide  400 mg Oral BID  . pantoprazole  40 mg Oral QHS  . sodium chloride  10-40 mL Intracatheter Q12H  . sodium chloride  3 mL Intravenous Q12H   Continuous Infusions:   Active Problems:   Myelodysplasia   Symptomatic anemia   Adrenal infarction   Acute renal failure   Acute on chronic diastolic heart failure   Altered mental status   Acute-on-chronic respiratory failure   Hypotension, unspecified   Severe sepsis   DVT (deep venous thrombosis)   Pleural effusion   Thrombocytopenia   Hypokalemia    Time spent: 25 minutes     [facility] Pager [phone]. If 7PM-7AM, please contact night-coverage at [email or web address], password [facility] 12/10/2013, 4:12 PM  LOS: 33 days

## 2013-12-10 NOTE — Progress Notes (Signed)
IP PROGRESS NOTE  Subjective:   Patient is sleepy and slow lethargic this morning but easily arousable and answers questions properly. She has not reported any bleeding at this time.  Objective:  Vital signs in last 24 hours: Temp:  [97 F (36.1 C)-100.7 F (38.2 C)] 99.5 F (37.5 C) (03/15 0437) Pulse Rate:  [113-115] 113 (03/15 0437) Resp:  [18-20] 20 (03/15 0437) BP: (141-146)/(70-80) 146/70 mmHg (03/15 0437) SpO2:  [100 %] 100 % (03/15 0437) Weight:  [173 lb 4.5 oz (78.6 kg)] 173 lb 4.5 oz (78.6 kg) (03/15 0437) Weight change: -14 lb 8.8 oz (-6.6 kg) Last BM Date: 12/08/13  Intake/Output from previous day: 03/14 0701 - 03/15 0700 In: 10 [P.O.:420] Out: 825 [Urine:825]  Mouth: mucous membranes moist, pharynx normal without lesions Resp: clear to auscultation bilaterally Cardio: regular rate and rhythm, S1, S2 normal, no murmur, click, rub or gallop GI: soft, non-tender; bowel sounds normal; no masses,  no organomegaly Extremities: extremities normal, atraumatic, no cyanosis or edema A maculopapular rash noted on her right and left arm slightly raised without any bleeding or pruritus.   Current Facility-Administered Medications  Medication Dose Route Frequency Provider Last Rate Last Dose  . acetaminophen (TYLENOL) tablet 650 mg  650 mg Oral Q4H PRN Raylene Miyamoto, MD   650 mg at 12/09/13 1409  . albuterol (PROVENTIL) (2.5 MG/3ML) 0.083% nebulizer solution 2.5 mg  2.5 mg Nebulization Q4H PRN Colbert Coyer, MD   2.5 mg at 12/05/13 2033  . DULoxetine (CYMBALTA) DR capsule 60 mg  60 mg Oral QPM Berle Mull, MD   60 mg at 12/09/13 1741  . fentaNYL (SUBLIMAZE) injection 50 mcg  50 mcg Intravenous Q4H PRN Mariea Clonts, MD   50 mcg at 12/10/13 0007  . fluticasone (FLONASE) 50 MCG/ACT nasal spray 2 spray  2 spray Each Nare Daily Allie Bossier, MD   2 spray at 12/09/13 [phone]  . guaifenesin (ROBITUSSIN) 100 MG/5ML syrup 200 mg  200 mg Oral Q4H PRN Kinnie Feil,  MD   200 mg at 12/08/13 1846  . hydrocortisone (CORTEF) tablet 10 mg  10 mg Oral BID Volanda Napoleon, MD      . ipratropium (ATROVENT) nebulizer solution 0.5 mg  0.5 mg Nebulization Q4H PRN Colbert Coyer, MD   0.5 mg at 12/05/13 0646  . magnesium oxide (MAG-OX) tablet 400 mg  400 mg Oral BID Erline Hau, MD   400 mg at 12/09/13 2035  . ondansetron (ZOFRAN) injection 4 mg  4 mg Intravenous Q6H PRN Erline Hau, MD   4 mg at 12/09/13 2035  . pantoprazole (PROTONIX) EC tablet 40 mg  40 mg Oral QHS Kara Mead, [place]   40 mg at 12/09/13 2036  . sodium chloride 0.9 % injection 10-40 mL  10-40 mL Intracatheter Q12H Volanda Napoleon, MD   10 mL at 12/05/13 0956  . sodium chloride 0.9 % injection 10-40 mL  10-40 mL Intracatheter PRN Volanda Napoleon, MD   10 mL at 12/10/13 0424  . sodium chloride 0.9 % injection 3 mL  3 mL Intravenous Q12H Berle Mull, MD   3 mL at 12/05/13 2215    Lab Results:  Recent Labs  12/09/13 0820 12/10/13 0421  WBC 6.3 7.2  HGB 8.6* 7.8*  HCT 25.7* 24.3*  PLT 15* 14*    BMET  Recent Labs  12/09/13 0820 12/10/13 0421  NA 136* 132*  K 3.6* 4.5  CL 97 96  CO2 31 24  GLUCOSE 101* 114*  BUN 17 15  CREATININE 0.93 0.99  CALCIUM 8.7 8.8    Medications: I have reviewed the patient's current medications.  Assessment/Plan:  58 year old woman with the following issues:  1. Myelodysplastic syndrome: She is currently on Vidaza which was started on 12/06/2013.  2. Pancytopenia: This is related to her myelodysplastic syndrome. Her hemoglobin is at 7.8 and I am okay and holding off on any transfusions unless hemoglobin drops below 7. As for her platelets, unless she has bleeding which we will watch closely my freshet will be around 10,000 to transfuse.  3. Skin rash: This was noted in her upper extremity but I don't really see any diffuse nature of it. I have personally reviewed her medications today and I do not see any  specific medication that could be causing this other than the Vidaza.  3. Rash: Not quite sure of this is a new finding but appears to be a drug rash. Not see any antibiotics I could be causing that it is possible that could be the [place]. I would suggest holding up for today to make sure that this rash improves before we resume it at a subsequent date.   LOS: 33 days   GGYIRS,WNIOE 12/10/2013, 8:31 AM

## 2013-12-10 NOTE — Progress Notes (Signed)
Per CSW's note from 3/6, Pt has chosen [facility] for rehab and ins Josem Kaufmann has been obtained.  Pt can d/c to said facility when ready.  Weekday CSW to follow.  Bernita Raisin, [place] Work [phone]

## 2013-12-11 ENCOUNTER — Encounter (HOSPITAL_COMMUNITY): Payer: Self-pay | Admitting: Radiology

## 2013-12-11 ENCOUNTER — Inpatient Hospital Stay (HOSPITAL_COMMUNITY): Payer: BC Managed Care – PPO

## 2013-12-11 LAB — CBC WITH DIFFERENTIAL/PLATELET
BASOS ABS: 0 10*3/uL (ref 0.0–0.1)
Basophils Relative: 0 % (ref 0–1)
Eosinophils Absolute: 0.3 10*3/uL (ref 0.0–0.7)
Eosinophils Relative: 4 % (ref 0–5)
HCT: 23.6 % — ABNORMAL LOW (ref 36.0–46.0)
Hemoglobin: 7.6 g/dL — ABNORMAL LOW (ref 12.0–15.0)
Lymphocytes Relative: 25 % (ref 12–46)
Lymphs Abs: 1.9 10*3/uL (ref 0.7–4.0)
MCH: 28.5 pg (ref 26.0–34.0)
MCHC: 32.2 g/dL (ref 30.0–36.0)
MCV: 88.4 fL (ref 78.0–100.0)
MONO ABS: 0.3 10*3/uL (ref 0.1–1.0)
Monocytes Relative: 4 % (ref 3–12)
NEUTROS PCT: 67 % (ref 43–77)
Neutro Abs: 5 10*3/uL (ref 1.7–7.7)
PLATELETS: 12 10*3/uL — AB (ref 150–400)
RBC: 2.67 MIL/uL — ABNORMAL LOW (ref 3.87–5.11)
RDW: 18 % — ABNORMAL HIGH (ref 11.5–15.5)
WBC: 7.5 10*3/uL (ref 4.0–10.5)

## 2013-12-11 LAB — COMPREHENSIVE METABOLIC PANEL
ALT: 9 U/L (ref 0–35)
AST: 11 U/L (ref 0–37)
Albumin: 2.4 g/dL — ABNORMAL LOW (ref 3.5–5.2)
Alkaline Phosphatase: 72 U/L (ref 39–117)
BILIRUBIN TOTAL: 0.8 mg/dL (ref 0.3–1.2)
BUN: 12 mg/dL (ref 6–23)
CHLORIDE: 100 meq/L (ref 96–112)
CO2: 24 meq/L (ref 19–32)
CREATININE: 1.03 mg/dL (ref 0.50–1.10)
Calcium: 8.7 mg/dL (ref 8.4–10.5)
GFR calc Af Amer: 68 mL/min — ABNORMAL LOW (ref >90)
GFR calc non Af Amer: 58 mL/min — ABNORMAL LOW (ref >90)
Glucose, Bld: 122 mg/dL — ABNORMAL HIGH (ref 70–99)
Potassium: 4.2 mEq/L (ref 3.7–5.3)
Sodium: 135 mEq/L — ABNORMAL LOW (ref 137–147)
Total Protein: 6.8 g/dL (ref 6.0–8.3)

## 2013-12-11 LAB — LACTATE DEHYDROGENASE: LDH: 271 U/L — AB (ref 94–250)

## 2013-12-11 LAB — RETICULOCYTES: RBC.: 2.67 MIL/uL — ABNORMAL LOW (ref 3.87–5.11)

## 2013-12-11 MED ORDER — DIPHENHYDRAMINE HCL 25 MG PO CAPS
25.0000 mg | ORAL_CAPSULE | Freq: Two times a day (BID) | ORAL | Status: DC | PRN
  Administered 2013-12-11 – 2013-12-15 (×4): 25 mg via ORAL
  Filled 2013-12-11 (×4): qty 1

## 2013-12-11 MED ORDER — ONDANSETRON HCL 8 MG PO TABS
8.0000 mg | ORAL_TABLET | Freq: Once | ORAL | Status: AC
  Administered 2013-12-11: 8 mg via ORAL
  Filled 2013-12-11: qty 1

## 2013-12-11 MED ORDER — ENSURE COMPLETE PO LIQD: 237.0000 mL | Freq: Two times a day (BID) | ORAL | Status: DC | PRN

## 2013-12-11 MED ORDER — AZACITIDINE CHEMO SQ INJECTION
75.0000 mg/m2 | Freq: Once | INTRAMUSCULAR | Status: AC
  Administered 2013-12-11: 145 mg via SUBCUTANEOUS
  Filled 2013-12-11: qty 5.8

## 2013-12-11 MED ORDER — IPRATROPIUM-ALBUTEROL 0.5-2.5 (3) MG/3ML IN SOLN
3.0000 mL | RESPIRATORY_TRACT | Status: DC | PRN
  Administered 2013-12-23: 3 mL via RESPIRATORY_TRACT
  Filled 2013-12-11: qty 3

## 2013-12-11 MED ORDER — IPRATROPIUM-ALBUTEROL 0.5-2.5 (3) MG/3ML IN SOLN: 3.0000 mL | RESPIRATORY_TRACT | Status: DC

## 2013-12-11 NOTE — Progress Notes (Signed)
Sent updated physical therapy notes to [facility] to try to update auth in anticipation that patient will be discharged tomorrow.  Lacey [name] C. [place] MSW, [place]

## 2013-12-11 NOTE — Progress Notes (Signed)
Lacey [name] is holding her own.  Did not get any Vidaza on Sunday.  No problems with pain. I will repeat a CT scan of her abdomen so we can see how the left pelvic hematoma is doing.  No fevers. She is off antibiotics.  The tongue ulcer continues to heal.  She's had no bleeding. She's had a little nausea but no emesis.  Is no diarrhea.  She continues to have a very good urine output.  Her vital signs are all stable. Temperature 98.2. Pulse 113. Blood pressure 131/63. Respiratory rate 20.  Oral exam shows the t ulcer on the right side of the tongue to be healing. No mucositis. Lungs are clear. Rhonchi are decreased. Cardiac exam is tachycardic but regular. Abdomen is soft. She has good bowel sounds. There is no guarding or rebound tenderness. Back exam no tenderness over the spine. Extremities shows no clubbing cyanosis or edema. Skin exam shows some scattered ecchymoses. Neurological exam is nonfocal.  White cell count 7.5. LDH 271. Hemoglobin 7.6. Platelet count 12.  We will continue her Vidaza.  We will get a chest x-ray on her. I also want a CT scan of the abdomen.  Physical therapy is important. I appreciate all the efforts.  Nutrition also is very key. Hopefully she will continue to increase her caloric intake.  It looks like she may be going to a rehabilitation facility. I certainly agree with this. She will need to have her blood counts checked quite frequently while there.

## 2013-12-11 NOTE — Progress Notes (Signed)
12/11/13 1500  Clinical Encounter Type  Visited With Patient (close friend [name])  Visit Type Spiritual support;Social support  Referral From Nurse  Spiritual Encounters  Spiritual Needs Emotional  Stress Factors  Patient Stress Factors Loss of control;Health changes   Visited to offer spiritual and emotional support.  Met [name], pt's close friend since age 59-4, who is a primary source of support and encouragement.  Provided pastoral presence and reflective listening.  Family aware of ongoing chaplain availability.  [place], [place]

## 2013-12-11 NOTE — Progress Notes (Signed)
Nutrition Services  Intervention: -Provided pt with "Sore Mouth" and "Changes in Taste and Smell" nutrition handouts. Pt appreciative of information. Reported appetite improving, able to eat small meals-applesauce, yogurt and Ensure Complete occasionally.  -Will order Ensure Complete PRN -Encouraged use of bland, cold foods to assist in tongue ulcer. -Recommended intake of soluble fiber to assist in loose stools.  -Recommend pt use plastic utensils/seasonings as she also noted metallic tastes  Plan to be d/c to rehab facility tomorrow (3/17) Please consult as needed  Colbert Colbert Clinical Dietitian [phone]

## 2013-12-11 NOTE — Progress Notes (Signed)
Physical Therapy Treatment Patient Details Name: ROZINA POINTER MRN: [id number] DOB: 1955/12/06 Today's Date: 12/11/2013 Time: 7048-8891 PT Time Calculation (min): 25 min  PT Assessment / Plan / Recommendation  History of Present Illness     PT Comments   Pt feeling very tiered/exhausted.  Assisted from supine to EOB + 2 assist and increased time.  Great difficulty scooting L hip 2nd pain.  Pt sat EOB x 2 min with MAX c/o fatigue requiring use of rail to hold self up.  Assisted with standing +2 assist and RW c/o MAX dizziness and unable to support self with L knee buckling.  Pt unable to weight shift and started to tremble throughout.  Stood pt twice.  Returned back to supine position. Pt demon very limited activity tolerance and weakness.  Will need ST Rehab at SNF.   Follow Up Recommendations  SNF     Does the patient have the potential to tolerate intense rehabilitation     Barriers to Discharge        Equipment Recommendations       Recommendations for Other Services    Frequency Min 3X/week   Progress towards PT Goals Progress towards PT goals: Progressing toward goals  Plan      Precautions / Restrictions Precautions Precautions: Fall Precaution Comments: L LE buckling Restrictions Weight Bearing Restrictions: No   Pertinent Vitals/Pain C/o L hip pain from knee up Cramping/throbbing/deep    Mobility  Bed Mobility Overal bed mobility: Needs Assistance Sidelying to sit: Max assist General bed mobility comments: assist for legs and trunk plus increased time Transfers Overall transfer level: Needs assistance Equipment used: Rolling walker (2 wheeled) Transfers: Sit to/from Stand Sit to Stand: +2 safety/equipment;Mod assist;Max assist General transfer comment: Sit to stand x 2 with Lt. knee buckling Ambulation/Gait General Gait Details: unable to attempt 2nd amount of assist needed to stand and L knee buckling with attempted weight shift also MAX c/o  dizzyness/light headedness     PT Goals (current goals can now be found in the care plan section)    Visit Information  Last PT Received On: 12/11/13 Assistance Needed: +2    Subjective Data      Cognition       Balance     End of Session PT - End of Session Equipment Utilized During Treatment: Gait belt Activity Tolerance: Patient limited by fatigue;Patient limited by pain Patient left: in bed;with call bell/phone within reach   Rica Koyanagi  PTA [facility]  Acute  Rehab Pager      [phone]

## 2013-12-11 NOTE — Progress Notes (Signed)
[facility] PROGRESS NOTE  Lacey [name] CZY:[id number] DOB: 07-21-1956 DOA: 11/03/2013 PCP: Vena Austria, MD  Brief summary  58 y.o. female with history of Hodgkin's lymphoma status post chemotherapy now with myelodysplastic syndrome who initially presented with abdominal pain, nausea, vomiting, hypotension and complicated with adrenal infarct, adrenal insufficiency, HCAP, recurrent sepsis, hypotension RNF<-->ICU/pressure support+IV atx;  -She also developed hemorrhage into her left iliopsoas muscle which caused weakness and pain of her left leg and LLQ. -Dr. Marin Olp is determining what treatment to initiate for her MDS as she has required frequent transfusions.   Assessment/Plan  1. Myelodysplastic syndrome in setting of Hodgkin Lymphoma:  -Refractory anemia and thrombocytopenia requiring almost daily blood product transfusions, Although for the past 3 days counts appear to be stable.  Bone Marrow Biopsy: HYPERCELLULAR BONE MARROW FOR AGE WITH DYSPOIETIC CHANGES. -myelodysplastic/myeloproliferative process, particularly chronic myelomonocytic leukemia - Appreciate Dr Jonette Eva assistance. on frequent daily transfusions (PRBC+platelets) as patient is developing antibodies. Per hem/onc to start Vidaza on 3/11 -3/14: Plt down to 15. Watch closely for need for transfusion (if <10 or active bleeding). -3/15: Hb down to 7.8.  2. Recurrent fever/septic shock, pneumonia Likely due dysfunction leukocytes as above  -CT chest on 2/23: Persistent consolidation RML with new peribronchovascular opacities suggestive of bronchopneumonia. Underwent bronch on 2/26; BCx NGTD,  Diff PCR neg, BAL: PCP neg, Bacterial, fungal, and AFB cultures NGTD  - 3/6: febrile; CT: Persistent right upper lobe collapse/ consolidation with interval development of central airspace disease in the left upper lobe  - 3/6: restarted IV atx, antifungals, acyclovir  -off pressors; clinically improving, started taper IV  steroids 3/10  -3/11: PICC line is out. Will stop all antibiotics and antifungals as all cx data has been negative to date. Hopefully fevers and "sepsis" will not recur.  3. Acute on chronic diastolic heart failure due to IVF from resuscitation. Echo (2/15): Preserved EF and grade 1 diastolic heart failure -R sided pleural effusion; anasarca, cont IV lasix; Daily weights and strict I/O; monitor renal function -per pulmonology deferred thoracentesis due to thrombocytopenia, R sided pleural effusion    4. 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 -On PO hydrocortisone.  5. Acute Renal Failure due to septic shock; RUS wnl. Hypervolemic  - resumed lasix due to respiratory status; close monitor   6. Spontaneous retroperitoneal bleed in setting of thrombocytopenia; hemorrhage into her left iliopsoas muscle which caused weakness and pain of her left leg and LLQ - on fentanyl; cont pain control  -Resume PT/OT.  7. Right upper extremity superficial thrombosis, stable   8. Transaminitis due to septic shock, resolved.   9. Urge and stress incontinence; Foley placed for comfort   10. Tongue ulcer; cont as above; biopsied by ENT; path: SQUAMOUS EPITHELIUM. NO DYSPLASIA OR MALIGNANCY -improving   11. Hypokalemia -Replete PO. -Mg 1.6 (low-normal); will place on PO mag supplementation as well.    Proph: Hold for thrombocytopenia, spont retroperitoneal bleed   Code Status: full  Family Communication: patient alone,  Disposition Plan: Will need SNF. Patient agrees. SW consult requested.  Consultants:  Dr. Burney Gauze (oncology)  Dr Gibson Ramp (Surgery) curbside consult  Dr. Carlyle Basques (infectious disease)    Procedures:  11/12/2013 right upper extremity Doppler  No evidence of deep vein thrombosis involving the right upper extremity, left subclavian vein, and left internal jugular vein. There is superficial thrombosis  noted in the right cephalic vein.  Old PICC removed 3/5; Left-sided  PICC 03/06 >>>  Foley 3/2 >>>   -2/12 Bone marrow biopsy report pending  Echocardiogram 01/10/2013  - Left ventricle: mild focal basal hypertrophy of the septum.  -LVEF= ejection fraction was 55%. Wall motion was normal; there were no regional wall motion abnormalities.  -(grade 1 diastolic dysfunction).  - Aortic valve: There was no stenosis. Trivial regurgitation. - Mitral valve: Trivial regurgitation. - Left atrium: The atrium was mildly dilated. - Right ventricle: The cavity size was normal. Systolic function was normal. - Tricuspid valve: Peak RV-RA gradient: 51mm Hg (S). - Pulmonary arteries: PA peak pressure: 46mm Hg (S). - Inferior vena cava: The vessel was normal in size; the respirophasic diameter changes were in the normal range (=50%); findings are consistent with normal central venous pressure.  Antibiotics:  None at present  2/15 Upper extremity Dopplers demonstrated acute clot of the right cephalic vein  9/44 Chest CT demonstrated loculated right pleural effusion which does not appear to have intact  2/17 Echocardiogram demonstrates EF of 96-75% with diastolic dysfunction.  2/24 Abdominal CT demonstrated hemorrhage in the left iliacus muscle  2/23 Chest CT demonstrated persistent right pleural effusion and right middle lobe density  2/25 Head CT demonstrated sphenoid sinusitis  2/26 Bronchoscopy did not reveal an infectious organism  3/3 Lower extremity Dopplers did not detect DVTs  3/6 Chest CT> persistent collapse of the RML, RLL with a large pleural effusion. Development of a new left airspace opacity.  3/8 TTE >>> Ef 65%, no RWMA, PAP 35 torr  3/8 Abdomen CT >>> No findings consistent with sepsis, evolution of L iliac hematoma  3/8 [place] R Effusion>>>small in volume, loculated & complex appearing by [place], [place]   Consultants:  Oncology  HPI/Subjective: alert  Objective: Filed Vitals:    12/11/13 1321  BP: 135/50  Pulse: 115  Temp: 99.2 F (37.3 C)  Resp: 20    Intake/Output Summary (Last 24 hours) at 12/11/13 1458 Last data filed at 12/11/13 1454  Gross per 24 hour  Intake    920 ml  Output   3300 ml  Net  -2380 ml   Filed Weights   12/09/13 0503 12/10/13 0437 12/11/13 0532  Weight: 85.2 kg (187 lb 13.3 oz) 78.6 kg (173 lb 4.5 oz) 77.4 kg (170 lb 10.2 oz)    Exam:   General:  alert  Cardiovascular: s1,s2 rrr  Respiratory: few LL crackles   Abdomen: soft, nt, nd   Musculoskeletal: LE edema   Data Reviewed: Basic Metabolic Panel:  Recent Labs Lab 12/07/13 0525 12/08/13 0455 12/09/13 0820 12/10/13 0421 12/11/13 0448  NA 139 137 136* 132* 135*  K 2.3* 3.1* 3.6* 4.5 4.2  CL 94* 91* 97 96 100  CO2 35* 36* $Remov'31 24 24  'RFndii$ GLUCOSE 96 138* 101* 114* 122*  BUN $Re'22 19 17 15 12  'YPX$ CREATININE 0.94 0.95 0.93 0.99 1.03  CALCIUM 7.9* 8.7 8.7 8.8 8.7  MG 1.6  --   --   --   --    Liver Function Tests:  Recent Labs Lab 12/09/13 0820 12/10/13 0421 12/11/13 0448  AST $Re'9 10 11  'wcF$ ALT $R'8 9 9  'wZ$ ALKPHOS 63 67 72  BILITOT 0.9 0.8 0.8  PROT 6.7 6.9 6.8  ALBUMIN 2.3* 2.4* 2.4*   No results found for this basename: LIPASE, AMYLASE,  in the last 168 hours No results found for this basename: AMMONIA,  in the last 168 hours CBC:  Recent Labs Lab 12/07/13 0525 12/08/13 0455 12/09/13 0820  12/10/13 0421 12/11/13 0448  WBC 4.4 4.9 6.3 7.2 7.5  NEUTROABS  --   --  3.6 4.2 5.0  HGB 8.4* 9.1* 8.6* 7.8* 7.6*  HCT 26.5* 28.7* 25.7* 24.3* 23.6*  MCV 88.6 88.6 86.5 88.0 88.4  PLT 21* 20* 15* 14* 12*   Cardiac Enzymes: No results found for this basename: CKTOTAL, CKMB, CKMBINDEX, TROPONINI,  in the last 168 hours BNP (last 3 results)  Recent Labs  11/14/13 0435 11/17/13 0800 11/20/13 0515  PROBNP 2977.0* 9541.0* 3178.0*   CBG: No results found for this basename: GLUCAP,  in the last 168 hours  Recent Results (from the past 240 hour(s))  CULTURE,  EXPECTORATED SPUTUM-ASSESSMENT     Status: None   Collection Time    12/03/13  3:47 AM      Result Value Ref Range Status   Specimen Description SPUTUM   Final   Special Requests Immunocompromised   Final   Sputum evaluation     Final   Value: MICROSCOPIC FINDINGS SUGGEST THAT THIS SPECIMEN IS NOT REPRESENTATIVE OF LOWER RESPIRATORY SECRETIONS. PLEASE RECOLLECT.     RESULTS CALLED TO, READ BACK BY AND VERIFIED WITH PAT MICKLEY,RN [id number] @ 3149 BY J SCOTTON   Report Status 12/03/2013 FINAL   Final  CLOSTRIDIUM DIFFICILE BY PCR     Status: None   Collection Time    12/06/13 10:51 AM      Result Value Ref Range Status   C difficile by pcr NEGATIVE  NEGATIVE Final   Comment: Performed at [facility]     Studies: Ct Abdomen Pelvis Wo Contrast  12/11/2013   CLINICAL DATA:  Left hip and leg pain. Follow-up retroperitoneal hematoma.  EXAM: CT ABDOMEN AND PELVIS WITHOUT CONTRAST  TECHNIQUE: Multidetector CT imaging of the abdomen and pelvis was performed following the standard protocol without intravenous contrast.  COMPARISON:  CT ABD/PELV WO CM dated 12/02/2013; CT PELVIS W/O CM dated 11/24/2013; CT ABD/PELVIS W CM dated 11/12/2013  FINDINGS: Moderate-sized dependent right pleural effusion is stable in size. There is dependent high-density within the right pleural space. There is interval improved aeration of the lung bases. No significant left pleural or pericardial effusion is present.  Multiple hepatic cysts are again demonstrated, not significantly changed. The gallbladder, pancreas and spleen appear unremarkable. There is low-density enlargement of both adrenal glands, left greater than right, similar to the most recent examination. The adrenal enlargement has progressed since last month and likely represents subacute hemorrhage. Renal cortical scarring is present bilaterally. There is no hydronephrosis.  The left iliacus hematoma is unchanged in size. No new intra-abdominal fluid collections  are identified. There is stable stranding along the lateral conal fascia. The bladder remains decompressed by Foley catheter. The uterus and ovaries appear unremarkable.  No fractures or worrisome osseous findings are demonstrated. There is lower lumbar spine facet disease.  IMPRESSION: 1. Unchanged left iliacus hematoma. 2. Stable enlargement of both adrenal glands, new from last month and consistent with subacute hemorrhage. 3. Stable mildly complex right pleural effusion, also new from last month and probably posttraumatic in etiology.   Electronically Signed   By: Camie Patience M.D.   On: 12/11/2013 08:26   Dg Chest 2 View  12/11/2013   CLINICAL DATA:  Productive cough  EXAM: CHEST  2 VIEW  COMPARISON:  12/05/2013  FINDINGS: Cardiac shadow is stable. The left lung remains clear. A left-sided PICC line is again noted and stable. There are changes in the right perihilar region  although aeration is improved when compare with the prior study. No new focal abnormality is seen.  IMPRESSION: Improved aeration in the right lung. No new focal abnormality is noted.   Electronically Signed   By: Inez Catalina M.D.   On: 12/11/2013 08:09    Scheduled Meds: . DULoxetine  60 mg Oral QPM  . fluticasone  2 spray Each Nare Daily  . hydrocortisone  10 mg Oral BID  . magnesium oxide  400 mg Oral BID  . pantoprazole  40 mg Oral QHS  . sodium chloride  10-40 mL Intracatheter Q12H  . sodium chloride  3 mL Intravenous Q12H   Continuous Infusions:   Active Problems:   Myelodysplasia   Symptomatic anemia   Adrenal infarction   Acute renal failure   Acute on chronic diastolic heart failure   Altered mental status   Acute-on-chronic respiratory failure   Hypotension, unspecified   Severe sepsis   DVT (deep venous thrombosis)   Pleural effusion   Thrombocytopenia   Hypokalemia    Time spent: 25 minutes     [facility] Pager [phone]. If 7PM-7AM, please contact  night-coverage at [email or web address], password [facility] 12/11/2013, 2:58 PM  LOS: 34 days

## 2013-12-12 LAB — COMPREHENSIVE METABOLIC PANEL
ALT: 11 U/L (ref 0–35)
AST: 12 U/L (ref 0–37)
Albumin: 2.4 g/dL — ABNORMAL LOW (ref 3.5–5.2)
Alkaline Phosphatase: 79 U/L (ref 39–117)
BUN: 11 mg/dL (ref 6–23)
CALCIUM: 8.9 mg/dL (ref 8.4–10.5)
CO2: 23 meq/L (ref 19–32)
Chloride: 98 mEq/L (ref 96–112)
Creatinine, Ser: 1 mg/dL (ref 0.50–1.10)
GFR, EST AFRICAN AMERICAN: 70 mL/min — AB (ref >90)
GFR, EST NON AFRICAN AMERICAN: 60 mL/min — AB (ref >90)
GLUCOSE: 114 mg/dL — AB (ref 70–99)
Potassium: 3.7 mEq/L (ref 3.7–5.3)
SODIUM: 133 meq/L — AB (ref 137–147)
Total Bilirubin: 0.5 mg/dL (ref 0.3–1.2)
Total Protein: 7.3 g/dL (ref 6.0–8.3)

## 2013-12-12 LAB — CBC WITH DIFFERENTIAL/PLATELET
BASOS PCT: 1 % (ref 0–1)
Basophils Absolute: 0.1 10*3/uL (ref 0.0–0.1)
Eosinophils Absolute: 0.2 10*3/uL (ref 0.0–0.7)
Eosinophils Relative: 3 % (ref 0–5)
HCT: 21.5 % — ABNORMAL LOW (ref 36.0–46.0)
HEMOGLOBIN: 7.1 g/dL — AB (ref 12.0–15.0)
LYMPHS PCT: 31 % (ref 12–46)
Lymphs Abs: 2.1 10*3/uL (ref 0.7–4.0)
MCH: 28.6 pg (ref 26.0–34.0)
MCHC: 33 g/dL (ref 30.0–36.0)
MCV: 86.7 fL (ref 78.0–100.0)
Monocytes Absolute: 0.3 10*3/uL (ref 0.1–1.0)
Monocytes Relative: 5 % (ref 3–12)
NEUTROS PCT: 60 % (ref 43–77)
Neutro Abs: 4.1 10*3/uL (ref 1.7–7.7)
Platelets: 14 10*3/uL — CL (ref 150–400)
RBC: 2.48 MIL/uL — ABNORMAL LOW (ref 3.87–5.11)
RDW: 17.9 % — AB (ref 11.5–15.5)
WBC: 6.8 10*3/uL (ref 4.0–10.5)

## 2013-12-12 LAB — RETICULOCYTES: RBC.: 2.48 MIL/uL — AB (ref 3.87–5.11)

## 2013-12-12 LAB — LACTATE DEHYDROGENASE: LDH: 263 U/L — ABNORMAL HIGH (ref 94–250)

## 2013-12-12 MED ORDER — ONDANSETRON 8 MG/NS 50 ML IVPB
8.0000 mg | Freq: Three times a day (TID) | INTRAVENOUS | Status: AC
  Administered 2013-12-12 – 2013-12-14 (×8): 8 mg via INTRAVENOUS
  Filled 2013-12-12 (×10): qty 8

## 2013-12-12 NOTE — Progress Notes (Signed)
[facility] PROGRESS NOTE  Lacey [name]:[id number] DOB: 1956-08-24 DOA: 11/16/2013 PCP: Vena Austria, MD  Brief summary  58 y.o. female with history of Hodgkin's lymphoma status post chemotherapy now with myelodysplastic syndrome who initially presented with abdominal pain, nausea, vomiting, hypotension and complicated with adrenal infarct, adrenal insufficiency, HCAP, recurrent sepsis, hypotension RNF<-->ICU/pressure support+IV atx;  -She also developed hemorrhage into her left iliopsoas muscle which caused weakness and pain of her left leg and LLQ. -Dr. Marin Olp started her on [place] for her MDS. -Now waiting for insurance auth to go to SNF. -Also, may need another platelet/PRBC transfusion prior to DC.  Assessment/Plan  1. Myelodysplastic syndrome in setting of Hodgkin Lymphoma:  -Refractory anemia and thrombocytopenia requiring almost daily blood product transfusions, Although for the past 6 days counts appear to be somewhat stable.  Bone Marrow Biopsy: HYPERCELLULAR BONE MARROW FOR AGE WITH DYSPOIETIC CHANGES. -myelodysplastic/myeloproliferative process, particularly chronic myelomonocytic leukemia - Appreciate Dr Jonette Eva assistance. on frequent daily transfusions (PRBC+platelets) as patient is developing antibodies. Per hem/onc to start Vidaza on 3/11 -3/17: Plt down to 14. Watch closely for need for transfusion (if <10 or active bleeding). -3/17: Hb down to 7.1.  2. Recurrent fever/septic shock, pneumonia Likely due to dysfunctional  leukocytes as above  -CT chest on 2/23: Persistent consolidation RML with new peribronchovascular opacities suggestive of bronchopneumonia. Underwent bronch on 2/26; BCx NGTD,  Diff PCR neg, BAL: PCP neg, Bacterial, fungal, and AFB cultures NGTD  - 3/6: febrile; CT: Persistent right upper lobe collapse/ consolidation with interval development of central airspace disease in the left upper lobe  - 3/6: restarted IV atx, antifungals,  acyclovir  -off pressors; clinically improving, started taper IV steroids 3/10  -3/11: PICC line is out. Will stop all antibiotics and antifungals as all cx data has been negative to date. Hopefully fevers and "sepsis" will not recur. -3/17: no further signs of sepsis since discontinuation of antibiotics.  3. Acute on chronic diastolic heart failure due to IVF from resuscitation. Echo (2/15): Preserved EF and grade 1 diastolic heart failure -R sided pleural effusion; anasarca, cont IV lasix; Daily weights and strict I/O; monitor renal function -per pulmonology deferred thoracentesis due to thrombocytopenia, R sided pleural effusion   -Is 13 L negative since admission.  4. 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  5. Acute Renal Failure due to septic shock; RUS wnl. Hypervolemic  - resumed lasix due to respiratory status; close monitor   6. Spontaneous retroperitoneal bleed in setting of thrombocytopenia; hemorrhage into her left iliopsoas muscle which caused weakness and pain of her left leg and LLQ - on fentanyl; cont pain control  -Resume PT/OT.  7. Right upper extremity superficial thrombosis, stable   8. Transaminitis due to septic shock, resolved.   9. Urge and stress incontinence; Foley placed for comfort   10. Tongue ulcer; cont as above; biopsied by ENT; path: SQUAMOUS EPITHELIUM. NO DYSPLASIA OR MALIGNANCY -improving   11. Hypokalemia -Repleted. -Mg 1.6 (low-normal); will place on PO mag supplementation as well.    Proph: Hold for thrombocytopenia, spont retroperitoneal bleed   Code Status: full  Family Communication: patient alone,  Disposition Plan: To SNF once insurance approves and ok with Dr. Marin Olp.  Consultants:  Dr. Burney Gauze (oncology)  Dr Gibson Ramp (Surgery) curbside consult  Dr. Carlyle Basques (infectious disease)    Procedures:  11/12/2013 right upper extremity Doppler  No evidence of  deep vein thrombosis involving the right upper  extremity, left subclavian vein, and left internal jugular vein. There is superficial thrombosis noted in the right cephalic vein.  Old PICC removed 3/5; Left-sided PICC 03/06 >>>  Foley 3/2 >>>   -2/12 Bone marrow biopsy report pending  Echocardiogram 01/10/2013  - Left ventricle: mild focal basal hypertrophy of the septum.  -LVEF= ejection fraction was 55%. Wall motion was normal; there were no regional wall motion abnormalities.  -(grade 1 diastolic dysfunction).  - Aortic valve: There was no stenosis. Trivial regurgitation. - Mitral valve: Trivial regurgitation. - Left atrium: The atrium was mildly dilated. - Right ventricle: The cavity size was normal. Systolic function was normal. - Tricuspid valve: Peak RV-RA gradient: 63m Hg (S). - Pulmonary arteries: PA peak pressure: 248mHg (S). - Inferior vena cava: The vessel was normal in size; the respirophasic diameter changes were in the normal range (=50%); findings are consistent with normal central venous pressure.  Antibiotics:  None at present  2/15 Upper extremity Dopplers demonstrated acute clot of the right cephalic vein  2/9/24hest CT demonstrated loculated right pleural effusion which does not appear to have intact  2/17 Echocardiogram demonstrates EF of 5526-83%ith diastolic dysfunction.  2/24 Abdominal CT demonstrated hemorrhage in the left iliacus muscle  2/23 Chest CT demonstrated persistent right pleural effusion and right middle lobe density  2/25 Head CT demonstrated sphenoid sinusitis  2/26 Bronchoscopy did not reveal an infectious organism  3/3 Lower extremity Dopplers did not detect DVTs  3/6 Chest CT> persistent collapse of the RML, RLL with a large pleural effusion. Development of a new left airspace opacity.  3/8 TTE >>> Ef 65%, no RWMA, PAP 35 torr  3/8 Abdomen CT >>> No findings consistent with sepsis, evolution of L iliac hematoma  3/8 [place] Effusion>>>small  in volume, loculated & complex appearing by [place] Consult[facility]:  Oncology  HPI/Subjective: alert  Objective: Filed Vitals:   12/12/13 1500  BP: 108/41  Pulse: 116  Temp: 100.5 F (38.1 C)  Resp: 18    Intake/Output Summary (Last 24 hours) at 12/12/13 1541 Last data filed at 12/12/13 0842  Gross per 24 hour  Intake    300 ml  Output   1871 ml  Net  -1571 ml   Filed Weights   12/10/13 0437 12/11/13 0532 12/12/13 0243  Weight: 78.6 kg (173 lb 4.5 oz) 77.4 kg (170 lb 10.2 oz) 77.4 kg (170 lb 10.2 oz)    Exam:   General:  alert  Cardiovascular: s1,s2 rrr  Respiratory: few LL crackles   Abdomen: soft, nt, nd   Musculoskeletal: LE edema   Data Reviewed: Basic Metabolic Panel:  Recent Labs Lab 12/07/13 0525 12/08/13 0455 12/09/13 0820 12/10/13 0421 12/11/13 0448 12/12/13 0500  NA 139 137 136* 132* 135* 133*  K 2.3* 3.1* 3.6* 4.5 4.2 3.7  CL 94* 91* 97 96 100 98  CO2 35* 36* _0 GLUCOSE 96 138* 101* 114* 122* 114*  BUN _1 CREATININE 0.94 0.95 0.93 0.99 1.03 1.00  CALCIUM 7.9* 8.7 8.7 8.8 8.7 8.9  MG 1.6  --   --   --   --   --    Liver Function Tests:  Recent Labs Lab 12/09/13 0820 12/10/13 0421 12/11/13 0448 12/12/13 0500  AST _2 ALT _3 ALKPHOS 63 67 72 79  BILITOT 0.9 0.8 0.8 0.5  PROT 6.7 6.9 6.8 7.3  ALBUMIN 2.3* 2.4* 2.4* 2.4*   No results found for this basename: LIPASE, AMYLASE,  in the last 168 hours No results found for this basename: AMMONIA,  in the last 168 hours CBC:  Recent Labs Lab 12/08/13 0455 12/09/13 0820 12/10/13 0421 12/11/13 0448 12/12/13 0500  WBC 4.9 6.3 7.2 7.5 6.8  NEUTROABS  --  3.6 4.2 5.0 4.1  HGB 9.1* 8.6* 7.8* 7.6* 7.1*  HCT 28.7* 25.7* 24.3* 23.6* 21.5*  MCV 88.6 86.5 88.0 88.4 86.7  PLT 20* 15* 14* 12* 14*   Cardiac Enzymes: No results found for this basename: CKTOTAL, CKMB, CKMBINDEX, TROPONINI,  in the last 168 hours BNP (last 3  results)  Recent Labs  11/14/13 0435 11/17/13 0800 11/20/13 0515  PROBNP 2977.0* 9541.0* 3178.0*   CBG: No results found for this basename: GLUCAP,  in the last 168 hours  Recent Results (from the past 240 hour(s))  CULTURE, EXPECTORATED SPUTUM-ASSESSMENT     Status: None   Collection Time    12/03/13  3:47 AM      Result Value Ref Range Status   Specimen Description SPUTUM   Final   Special Requests Immunocompromised   Final   Sputum evaluation     Final   Value: MICROSCOPIC FINDINGS SUGGEST THAT THIS SPECIMEN IS NOT REPRESENTATIVE OF LOWER RESPIRATORY SECRETIONS. PLEASE RECOLLECT.     RESULTS CALLED TO, READ BACK BY AND VERIFIED WITH PAT MICKLEY,RN [id number] @ 7915 BY J SCOTTON   Report Status 12/03/2013 FINAL   Final  CLOSTRIDIUM DIFFICILE BY PCR     Status: None   Collection Time    12/06/13 10:51 AM      Result Value Ref Range Status   C difficile by pcr NEGATIVE  NEGATIVE Final   Comment: Performed at [facility]     Studies: Ct Abdomen Pelvis Wo Contrast  12/11/2013   CLINICAL DATA:  Left hip and leg pain. Follow-up retroperitoneal hematoma.  EXAM: CT ABDOMEN AND PELVIS WITHOUT CONTRAST  TECHNIQUE: Multidetector CT imaging of the abdomen and pelvis was performed following the standard protocol without intravenous contrast.  COMPARISON:  CT ABD/PELV WO CM dated 12/02/2013; CT PELVIS W/O CM dated 11/24/2013; CT ABD/PELVIS W CM dated 11/04/2013  FINDINGS: Moderate-sized dependent right pleural effusion is stable in size. There is dependent high-density within the right pleural space. There is interval improved aeration of the lung bases. No significant left pleural or pericardial effusion is present.  Multiple hepatic cysts are again demonstrated, not significantly changed. The gallbladder, pancreas and spleen appear unremarkable. There is low-density enlargement of both adrenal glands, left greater than right, similar to the most recent examination. The adrenal enlargement has  progressed since last month and likely represents subacute hemorrhage. Renal cortical scarring is present bilaterally. There is no hydronephrosis.  The left iliacus hematoma is unchanged in size. No new intra-abdominal fluid collections are identified. There is stable stranding along the lateral conal fascia. The bladder remains decompressed by Foley catheter. The uterus and ovaries appear unremarkable.  No fractures or worrisome osseous findings are demonstrated. There is lower lumbar spine facet disease.  IMPRESSION: 1. Unchanged left iliacus hematoma. 2. Stable enlargement of both adrenal glands, new from last month and consistent with subacute hemorrhage. 3. Stable mildly complex right pleural effusion, also new from last month and probably posttraumatic in etiology.   Electronically Signed   By: Camie Patience M.D.   On: 12/11/2013 08:26   Dg Chest 2 View  12/11/2013  CLINICAL DATA:  Productive cough  EXAM: CHEST  2 VIEW  COMPARISON:  12/05/2013  FINDINGS: Cardiac shadow is stable. The left lung remains clear. A left-sided PICC line is again noted and stable. There are changes in the right perihilar region although aeration is improved when compare with the prior study. No new focal abnormality is seen.  IMPRESSION: Improved aeration in the right lung. No new focal abnormality is noted.   Electronically Signed   By: Inez Catalina M.D.   On: 12/11/2013 08:09    Scheduled Meds: . DULoxetine  60 mg Oral QPM  . fluticasone  2 spray Each Nare Daily  . hydrocortisone  10 mg Oral BID  . magnesium oxide  400 mg Oral BID  . ondansetron (ZOFRAN) IV  8 mg Intravenous 3 times per day  . pantoprazole  40 mg Oral QHS  . sodium chloride  10-40 mL Intracatheter Q12H  . sodium chloride  3 mL Intravenous Q12H   Continuous Infusions:   Active Problems:   Myelodysplasia   Symptomatic anemia   Adrenal infarction   Acute renal failure   Acute on chronic diastolic heart failure   Altered mental status    Acute-on-chronic respiratory failure   Hypotension, unspecified   Severe sepsis   DVT (deep venous thrombosis)   Pleural effusion   Thrombocytopenia   Hypokalemia    Time spent: 25 minutes     [facility] Pager [phone]. If 7PM-7AM, please contact night-coverage at [email or web address], password [facility] 12/12/2013, 3:41 PM  LOS: 35 days

## 2013-12-12 NOTE — Progress Notes (Signed)
Clinical Social Work  CSW received a call from [employer] who reports they need updated MAR for insurance authorization. CSW faxed information via [facility] and SNF reports they will call when authorization received.  Sindy Messing, LCSW (Coverage for [employer])

## 2013-12-12 NOTE — Progress Notes (Signed)
Lacey [name] is about the same. She did try some physical therapy yesterday. I am I sure how well this went.  She's having some nausea. This probably is from the chemotherapy. I will hold off on her seventh day today. I'll put her on some Zofran on schedule.  Her CT scan showed a stable left iliacus muscle hematoma. Her chest x-ray actually looked a little bit better. She still has a little bit of a cough. This is nonproductive it.  She still has her Foley catheter in. She still wants to keep this in.  It is still hard for her to get out of bed on her own.  She's had no bleeding. There is no fever. She's had no diarrhea.  Her hemoglobin 7.1. Platelet count is 14. We will probably have to transfuse her tomorrow. Her LDH continues to drop. Renal function looks fantastic.   Her vital signs all look stable. Her pulse is 112. Blood pressure is 138/61. Her head and exam shows the tongue ulcer to be about the same. Lungs are with good breath sounds bilaterally. Cardiac exam tachycardic no goiter. Abdomen is soft. She is good bowel sounds. There is no palpable liver or spleen tip. Extremities shows no edema. Skin shows some scattered ecchymoses. She has a rash on her right arm which looks a little bit better. Neurological exam shows no focal deficits.  Again, we will hold her seventh day of Vidaza.  Am not sure how much more will be able to do for her right now. We really need to try to get her stronger. We have to try to get her to eat better. She is on Marinol.  Again, we may have to transfuse her tomorrow.  I suspect that she will be able to go to a rehabilitation facility by the end of the week.  [place] 55:22

## 2013-12-13 DIAGNOSIS — R5381 Other malaise: Secondary | ICD-10-CM

## 2013-12-13 DIAGNOSIS — R5383 Other fatigue: Secondary | ICD-10-CM

## 2013-12-13 LAB — CBC WITH DIFFERENTIAL/PLATELET
BASOS ABS: 0.1 10*3/uL (ref 0.0–0.1)
Basophils Relative: 1 % (ref 0–1)
Eosinophils Absolute: 0.1 10*3/uL (ref 0.0–0.7)
Eosinophils Relative: 2 % (ref 0–5)
HEMATOCRIT: 20.7 % — AB (ref 36.0–46.0)
HEMOGLOBIN: 6.8 g/dL — AB (ref 12.0–15.0)
Lymphocytes Relative: 34 % (ref 12–46)
Lymphs Abs: 2.1 10*3/uL (ref 0.7–4.0)
MCH: 28.6 pg (ref 26.0–34.0)
MCHC: 32.9 g/dL (ref 30.0–36.0)
MCV: 87 fL (ref 78.0–100.0)
MONOS PCT: 6 % (ref 3–12)
Monocytes Absolute: 0.4 10*3/uL (ref 0.1–1.0)
NEUTROS ABS: 3.4 10*3/uL (ref 1.7–7.7)
Neutrophils Relative %: 57 % (ref 43–77)
Platelets: 18 10*3/uL — CL (ref 150–400)
RBC: 2.38 MIL/uL — ABNORMAL LOW (ref 3.87–5.11)
RDW: 17.6 % — ABNORMAL HIGH (ref 11.5–15.5)
WBC: 6.1 10*3/uL (ref 4.0–10.5)

## 2013-12-13 LAB — COMPREHENSIVE METABOLIC PANEL
ALT: 13 U/L (ref 0–35)
AST: 12 U/L (ref 0–37)
Albumin: 2.4 g/dL — ABNORMAL LOW (ref 3.5–5.2)
Alkaline Phosphatase: 88 U/L (ref 39–117)
BUN: 10 mg/dL (ref 6–23)
CALCIUM: 8.7 mg/dL (ref 8.4–10.5)
CO2: 22 mEq/L (ref 19–32)
CREATININE: 1 mg/dL (ref 0.50–1.10)
Chloride: 98 mEq/L (ref 96–112)
GFR calc Af Amer: 70 mL/min — ABNORMAL LOW (ref >90)
GFR calc non Af Amer: 60 mL/min — ABNORMAL LOW (ref >90)
GLUCOSE: 109 mg/dL — AB (ref 70–99)
Potassium: 3.7 mEq/L (ref 3.7–5.3)
Sodium: 133 mEq/L — ABNORMAL LOW (ref 137–147)
TOTAL PROTEIN: 7.1 g/dL (ref 6.0–8.3)
Total Bilirubin: 0.5 mg/dL (ref 0.3–1.2)

## 2013-12-13 LAB — RETICULOCYTES: RBC.: 2.38 MIL/uL — AB (ref 3.87–5.11)

## 2013-12-13 LAB — PREPARE RBC (CROSSMATCH)

## 2013-12-13 LAB — LACTATE DEHYDROGENASE: LDH: 261 U/L — ABNORMAL HIGH (ref 94–250)

## 2013-12-13 LAB — HEMOGLOBIN AND HEMATOCRIT, BLOOD
HCT: 29.7 % — ABNORMAL LOW (ref 36.0–46.0)
Hemoglobin: 9.9 g/dL — ABNORMAL LOW (ref 12.0–15.0)

## 2013-12-13 MED ORDER — FUROSEMIDE 10 MG/ML IJ SOLN
60.0000 mg | Freq: Once | INTRAMUSCULAR | Status: AC
  Administered 2013-12-13: 60 mg via INTRAVENOUS
  Filled 2013-12-13: qty 6

## 2013-12-13 MED ORDER — OXYCODONE-ACETAMINOPHEN 5-325 MG PO TABS
2.0000 | ORAL_TABLET | Freq: Once | ORAL | Status: AC
  Administered 2013-12-13: 2 via ORAL
  Filled 2013-12-13: qty 2

## 2013-12-13 MED ORDER — ACETAMINOPHEN 325 MG PO TABS
650.0000 mg | ORAL_TABLET | Freq: Once | ORAL | Status: AC
  Administered 2013-12-13: 650 mg via ORAL
  Filled 2013-12-13: qty 2

## 2013-12-13 MED ORDER — ALPRAZOLAM 0.5 MG PO TABS
0.5000 mg | ORAL_TABLET | Freq: Three times a day (TID) | ORAL | Status: DC | PRN
  Administered 2013-12-13 – 2013-12-21 (×5): 0.5 mg via ORAL
  Filled 2013-12-13: qty 1
  Filled 2013-12-13: qty 2
  Filled 2013-12-13 (×3): qty 1

## 2013-12-13 NOTE — Progress Notes (Signed)
OT Cancellation Note  Patient Details Name: Lacey [name] MRN: [id number] DOB: 1956-02-09   Cancelled Treatment:    Reason Eval/Treat Not Completed: Medical issues which prohibited therapy.  RN is starting blood right now.  Will reattempt tx tomorrow.  Josafat Enrico 12/13/2013, 2:08 PM [name], OTR/L [phone] 12/13/2013

## 2013-12-13 NOTE — Progress Notes (Signed)
Chaplain paged at 1916. Lacey [name]'s husband has died in an automobile accident in [place]. There will be a medical inquiry into the death in [place] before his body is transported back to [place] for funeral and burial. At this time there is not further information when that will be.  Family and Lacey [name] wished to have chaplain present when they brought the news to Lacey [name]. When chaplain arrived family had decided that the pastor would bring the news of Mr [name]'s death with two other family members present. Chaplain was asked to stand  outside the door with a nurse in the event Lacey [name] would require staff intervention for physical or emotional distress.  After family and pastor finished, the chaplain spoke briefly with Lacey [name], who was very receptive to his care.   Lacey [name] was told again of Chaplain availability at any time. She asked to be seen by a daytime chaplain if possible on the 19 March.  Family members are aware of the probability the Lacey [name] will not be physically able to attend a funeral. Lacey [name] and family are planning a private ceremony with Lacey [name] after her husband's body returns to [place].  Lacey [name] reports her stress levels are high and her physical pain is elevated due to stress.  Follow up during daytime hours is indicated to follow up with grief and spiritual distress issues.  Lacey [name]. Lacey [name], Lacey [name]

## 2013-12-13 NOTE — Progress Notes (Signed)
[facility] PROGRESS NOTE  Lacey [name] GBE:[id number] DOB: [birth year] DOA: 11/10/2013 PCP: Vena Austria, MD  Brief summary  58 y.o. female with history of Hodgkin's lymphoma status post chemotherapy now with myelodysplastic syndrome who initially presented with abdominal pain, nausea, vomiting, hypotension and complicated with adrenal infarct, adrenal insufficiency, HCAP, recurrent sepsis, hypotension RNF<-->ICU/pressure support+IV atx;  -She also developed hemorrhage into her left iliopsoas muscle which caused weakness and pain of her left leg and LLQ. -Dr. Marin Olp started her on [place] for her MDS. -Now waiting for insurance auth to go to SNF. -Also, may need another platelet/PRBC transfusion prior to DC.  Assessment/Plan  1. Myelodysplastic syndrome in setting of Hodgkin Lymphoma:  -Refractory anemia and thrombocytopenia requiring almost daily blood product transfusions, Although for the past 6 days counts appear to be somewhat stable.  Bone Marrow Biopsy: HYPERCELLULAR BONE MARROW FOR AGE WITH DYSPOIETIC CHANGES. -myelodysplastic/myeloproliferative process, particularly chronic myelomonocytic leukemia - Appreciate Dr Jonette Eva assistance. on frequent daily transfusions (PRBC+platelets) as patient is developing antibodies. Per hem/onc, started Vidaza on 3/11 -3/17: Plt down to 14. Watch closely for need for transfusion (if <10 or active bleeding). -3/17: Hb down to 7.1. -3/18 - Hb to 6.8 - 2 units PRBC's transfused  2. Recurrent fever/septic shock, pneumonia Likely due to dysfunctional  leukocytes as above  -CT chest on 2/23: Persistent consolidation RML with new peribronchovascular opacities suggestive of bronchopneumonia. Underwent bronch on 2/26; BCx NGTD,  Diff PCR neg, BAL: PCP neg, Bacterial, fungal, and AFB cultures NGTD  - 3/6: febrile; CT: Persistent right upper lobe collapse/ consolidation with interval development of central airspace disease in the left upper  lobe  - 3/6: restarted IV atx, antifungals, acyclovir  -off pressors; clinically improving, started taper IV steroids 3/10  -3/11: PICC line is out. Will stop all antibiotics and antifungals as all cx data has been negative to date. Hopefully fevers and "sepsis" will not recur. -3/17: no further signs of sepsis since discontinuation of antibiotics.  3. Acute on chronic diastolic heart failure due to IVF from resuscitation. Echo (2/15): Preserved EF and grade 1 diastolic heart failure -R sided pleural effusion; anasarca, cont IV lasix; Daily weights and strict I/O; monitor renal function -per pulmonology deferred thoracentesis due to thrombocytopenia, R sided pleural effusion   -Is 13 L negative since admission.  4. 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  5. Acute Renal Failure due to septic shock; RUS wnl. Hypervolemic  - resumed lasix due to respiratory status; close monitor   6. Spontaneous retroperitoneal bleed in setting of thrombocytopenia; hemorrhage into her left iliopsoas muscle which caused weakness and pain of her left leg and LLQ - on fentanyl; cont pain control  -Resume PT/OT.  7. Right upper extremity superficial thrombosis, stable   8. Transaminitis due to septic shock, resolved.   9. Urge and stress incontinence; Foley placed for comfort   10. Tongue ulcer; cont as above; biopsied by ENT; path: SQUAMOUS EPITHELIUM. NO DYSPLASIA OR MALIGNANCY -improving   11. Hypokalemia -Repleted.  Proph: Hold for thrombocytopenia, spont retroperitoneal bleed   Code Status: full  Family Communication: patient alone,  Disposition Plan: To SNF once insurance approves and ok with Dr. Marin Olp.  Consultants:  Dr. Burney Gauze (oncology)  Dr Gibson Ramp (Surgery) curbside consult  Dr. Carlyle Basques (infectious disease)    Procedures:  11/12/2013 right upper extremity Doppler  No evidence of deep vein thrombosis  involving the right upper extremity, left subclavian vein,  and left internal jugular vein. There is superficial thrombosis noted in the right cephalic vein.  Old PICC removed 3/5; Left-sided PICC 03/06 >>>  Foley 3/2 >>>   -2/12 Bone marrow biopsy report pending  Echocardiogram 01/10/2013  - Left ventricle: mild focal basal hypertrophy of the septum.  -LVEF= ejection fraction was 55%. Wall motion was normal; there were no regional wall motion abnormalities.  -(grade 1 diastolic dysfunction).  - Aortic valve: There was no stenosis. Trivial regurgitation. - Mitral valve: Trivial regurgitation. - Left atrium: The atrium was mildly dilated. - Right ventricle: The cavity size was normal. Systolic function was normal. - Tricuspid valve: Peak RV-RA gradient: 79m Hg (S). - Pulmonary arteries: PA peak pressure: 225mHg (S). - Inferior vena cava: The vessel was normal in size; the respirophasic diameter changes were in the normal range (=50%); findings are consistent with normal central venous pressure.  Antibiotics:  None at present  2/15 Upper extremity Dopplers demonstrated acute clot of the right cephalic vein  2/3/00hest CT demonstrated loculated right pleural effusion which does not appear to have intact  2/17 Echocardiogram demonstrates EF of 5576-22%ith diastolic dysfunction.  2/24 Abdominal CT demonstrated hemorrhage in the left iliacus muscle  2/23 Chest CT demonstrated persistent right pleural effusion and right middle lobe density  2/25 Head CT demonstrated sphenoid sinusitis  2/26 Bronchoscopy did not reveal an infectious organism  3/3 Lower extremity Dopplers did not detect DVTs  3/6 Chest CT> persistent collapse of the RML, RLL with a large pleural effusion. Development of a new left airspace opacity.  3/8 TTE >>> Ef 65%, no RWMA, PAP 35 torr  3/8 Abdomen CT >>> No findings consistent with sepsis, evolution of L iliac hematoma  3/8 [place] Effusion>>>small in volume, loculated  & complex appearing by [place] Consultants:  Oncology  HPI/Subjective: No acute events noted overnight  Objective: Filed Vitals:   12/13/13 0538  BP: 115/62  Pulse: 105  Temp: 98.1 F (36.7 C)  Resp: 18    Intake/Output Summary (Last 24 hours) at 12/13/13 1241 Last data filed at 12/13/13 0541  Gross per 24 hour  Intake    240 ml  Output   1750 ml  Net  -1510 ml   Filed Weights   12/11/13 0532 12/12/13 0243 12/13/13 0538  Weight: 77.4 kg (170 lb 10.2 oz) 77.4 kg (170 lb 10.2 oz) 77.4 kg (170 lb 10.2 oz)    Exam:   General:  alert  Cardiovascular: s1,s2 rrr  Respiratory: few LL crackles   Abdomen: soft, nt, nd   Musculoskeletal: LE edema   Data Reviewed: Basic Metabolic Panel:  Recent Labs Lab 12/07/13 0525  12/09/13 0820 12/10/13 0421 12/11/13 0448 12/12/13 0500 12/13/13 0630  NA 139  < > 136* 132* 135* 133* 133*  K 2.3*  < > 3.6* 4.5 4.2 3.7 3.7  CL 94*  < > 97 96 100 98 98  CO2 35*  < > _0 GLUCOSE 96  < > 101* 114* 122* 114* 109*  BUN 22  < > _1 CREATININE 0.94  < > 0.93 0.99 1.03 1.00 1.00  CALCIUM 7.9*  < > 8.7 8.8 8.7 8.9 8.7  MG 1.6  --   --   --   --   --   --   < > = values in this interval not displayed. Liver Function Tests:  Recent Labs Lab 12/09/13 0820  12/10/13 0421 12/11/13 0448 12/12/13 0500 12/13/13 0630  AST _0 ALT _1 ALKPHOS 63 67 72 79 88  BILITOT 0.9 0.8 0.8 0.5 0.5  PROT 6.7 6.9 6.8 7.3 7.1  ALBUMIN 2.3* 2.4* 2.4* 2.4* 2.4*   No results found for this basename: LIPASE, AMYLASE,  in the last 168 hours No results found for this basename: AMMONIA,  in the last 168 hours CBC:  Recent Labs Lab 12/09/13 0820 12/10/13 0421 12/11/13 0448 12/12/13 0500 12/13/13 0630  WBC 6.3 7.2 7.5 6.8 6.1  NEUTROABS 3.6 4.2 5.0 4.1 3.4  HGB 8.6* 7.8* 7.6* 7.1* 6.8*  HCT 25.7* 24.3* 23.6* 21.5* 20.7*  MCV 86.5 88.0 88.4 86.7 87.0  PLT 15* 14* 12* 14* 18*   Cardiac  Enzymes: No results found for this basename: CKTOTAL, CKMB, CKMBINDEX, TROPONINI,  in the last 168 hours BNP (last 3 results)  Recent Labs  11/14/13 0435 11/17/13 0800 11/20/13 0515  PROBNP 2977.0* 9541.0* 3178.0*   CBG: No results found for this basename: GLUCAP,  in the last 168 hours  Recent Results (from the past 240 hour(s))  CLOSTRIDIUM DIFFICILE BY PCR     Status: None   Collection Time    12/06/13 10:51 AM      Result Value Ref Range Status   C difficile by pcr NEGATIVE  NEGATIVE Final   Comment: Performed at [facility]     Studies: No results found.  Scheduled Meds: . acetaminophen  650 mg Oral Once  . DULoxetine  60 mg Oral QPM  . fluticasone  2 spray Each Nare Daily  . furosemide  60 mg Intravenous Once  . hydrocortisone  10 mg Oral BID  . magnesium oxide  400 mg Oral BID  . ondansetron (ZOFRAN) IV  8 mg Intravenous 3 times per day  . pantoprazole  40 mg Oral QHS  . sodium chloride  10-40 mL Intracatheter Q12H  . sodium chloride  3 mL Intravenous Q12H   Continuous Infusions:   Active Problems:   Myelodysplasia   Symptomatic anemia   Adrenal infarction   Acute renal failure   Acute on chronic diastolic heart failure   Altered mental status   Acute-on-chronic respiratory failure   Hypotension, unspecified   Severe sepsis   DVT (deep venous thrombosis)   Pleural effusion   Thrombocytopenia   Hypokalemia    Time spent: 25 minutes     [facility], [facility] Pager [phone]. If 7PM-7AM, please contact night-coverage at [email or web address], password [facility] 12/13/2013, 12:41 PM  LOS: 36 days

## 2013-12-13 NOTE — Progress Notes (Signed)
CRITICAL VALUE ALERT  Critical value received:  Hgb 6.8  Platelet 18  Date of notification:  12/13/2013  Time of notification:  0725  Critical value read back: yes  Nurse who received alert:  Tor Netters  MD notified (1st page):  Dr. Isaac Bliss  Time of first page:  0725  MD notified (2nd page): Dr. Isaac Bliss  Time of second page: [phone]  Responding MD:  Still awaiting for MD response.   Time MD responded:  0750 - No response from MD.   Note: Endorsed MD's response/call back to in-coming RN Arbie Cookey about pt's critical lab values.

## 2013-12-13 NOTE — Progress Notes (Signed)
PT Cancellation Note  Patient Details Name: Lacey [name] MRN: [id number] DOB: 07/21/1956   Cancelled Treatment:    Reason Eval/Treat Not Completed: Medical issues which prohibited therapyunfortunately, PT cancelled due to blood just getting started for hgb of 6.8. Will return 3/19.   [name] 12/13/2013, 3:09 PM Tresa Endo PT [phone]

## 2013-12-13 NOTE — Progress Notes (Signed)
Lacey [name] is looking better. We did not give her the seventh day of Vidaza. I think this helped. She's not nauseated today. I have are scheduled Zofran. She said that she ate better yesterday.  Hopefully she will do so physical therapy today. She can move her left leg better.  She's had no fever. She does feel a little weak. Her cough is better.  Her hemoglobin is 6.8. I will go ahead and transfuse her. I think she is will help her. She is not bleeding so I would not give her platelets.  At this point, it is all about getting her more functional. Hopefully, physical therapy and do this for [place].  I don't know if she will qualify for inpatient rehabilitation at [facility]. This might be worthwhile checking into. If not, then outpatient rehabilitation facility.  Her tongue ulcer looks better. She's not coughing as much. Her lungs sound good on exam. She has good air movement bilaterally.  Her electrolytes still look good. Her kidney function is excellent. She still wants her Foley catheter in. We really have to think about getting that out.  I don't think she needs to be on cardiac monitor now. However, I will leave this up to the primary team to decide.  Her blood pressure is still holding steady at 115/62. She is on low-dose hydrocortisone for presumed adrenal insufficiency.  There is still no edema with her extremities.  She has this rash on her arms. This is more prominent on her right arm. This is a macular type rash. I don't see any obvious medications that she is on that would do this. I wonder if this needs to be biopsied. One always has to think about  Sweet's syndrome. This is probably with myelodysplasia. The only way to make the diagnosis is biopsy. She's given some Benadryl for this. She is on steroids. Yet, the rash is present. Certainly may be worthwhile biopsying this. A little punch biopsy would be all that is necessary.  Hopefully, she will get out of bed more today and increase  her level of activity.  Pete E.  Hebrews 12:12

## 2013-12-14 ENCOUNTER — Inpatient Hospital Stay (HOSPITAL_COMMUNITY): Payer: BC Managed Care – PPO

## 2013-12-14 DIAGNOSIS — L259 Unspecified contact dermatitis, unspecified cause: Secondary | ICD-10-CM

## 2013-12-14 LAB — TYPE AND SCREEN
ABO/RH(D): O POS
ANTIBODY SCREEN: POSITIVE
DAT, IgG: NEGATIVE
DONOR AG TYPE: NEGATIVE
DONOR AG TYPE: NEGATIVE
UNIT DIVISION: 0
Unit division: 0

## 2013-12-14 LAB — COMPREHENSIVE METABOLIC PANEL
ALBUMIN: 2.6 g/dL — AB (ref 3.5–5.2)
ALT: 16 U/L (ref 0–35)
AST: 14 U/L (ref 0–37)
Alkaline Phosphatase: 102 U/L (ref 39–117)
BUN: 15 mg/dL (ref 6–23)
CALCIUM: 8.9 mg/dL (ref 8.4–10.5)
CHLORIDE: 99 meq/L (ref 96–112)
CO2: 25 mEq/L (ref 19–32)
Creatinine, Ser: 1.27 mg/dL — ABNORMAL HIGH (ref 0.50–1.10)
GFR calc Af Amer: 52 mL/min — ABNORMAL LOW (ref >90)
GFR calc non Af Amer: 45 mL/min — ABNORMAL LOW (ref >90)
Glucose, Bld: 119 mg/dL — ABNORMAL HIGH (ref 70–99)
Potassium: 3.6 mEq/L — ABNORMAL LOW (ref 3.7–5.3)
Sodium: 137 mEq/L (ref 137–147)
TOTAL PROTEIN: 7.4 g/dL (ref 6.0–8.3)
Total Bilirubin: 0.8 mg/dL (ref 0.3–1.2)

## 2013-12-14 LAB — CBC WITH DIFFERENTIAL/PLATELET
BASOS PCT: 1 % (ref 0–1)
Basophils Absolute: 0.1 10*3/uL (ref 0.0–0.1)
EOS PCT: 5 % (ref 0–5)
Eosinophils Absolute: 0.4 10*3/uL (ref 0.0–0.7)
HEMATOCRIT: 23.6 % — AB (ref 36.0–46.0)
HEMOGLOBIN: 8 g/dL — AB (ref 12.0–15.0)
LYMPHS ABS: 2.1 10*3/uL (ref 0.7–4.0)
Lymphocytes Relative: 24 % (ref 12–46)
MCH: 28.7 pg (ref 26.0–34.0)
MCHC: 33.9 g/dL (ref 30.0–36.0)
MCV: 84.6 fL (ref 78.0–100.0)
MONO ABS: 0.6 10*3/uL (ref 0.1–1.0)
Monocytes Relative: 7 % (ref 3–12)
Neutro Abs: 5.4 10*3/uL (ref 1.7–7.7)
Neutrophils Relative %: 63 % (ref 43–77)
Platelets: 21 10*3/uL — CL (ref 150–400)
RBC: 2.79 MIL/uL — ABNORMAL LOW (ref 3.87–5.11)
RDW: 17.3 % — ABNORMAL HIGH (ref 11.5–15.5)
WBC: 8.6 10*3/uL (ref 4.0–10.5)

## 2013-12-14 LAB — LACTATE DEHYDROGENASE: LDH: 315 U/L — AB (ref 94–250)

## 2013-12-14 LAB — RETICULOCYTES
RBC.: 2.79 MIL/uL — ABNORMAL LOW (ref 3.87–5.11)
Retic Ct Pct: <0.4 % — ABNORMAL LOW (ref 0.4–3.1)

## 2013-12-14 MED ORDER — PROMETHAZINE HCL 25 MG/ML IJ SOLN
12.5000 mg | Freq: Once | INTRAMUSCULAR | Status: AC
  Administered 2013-12-15: 12.5 mg via INTRAVENOUS
  Filled 2013-12-14: qty 1

## 2013-12-14 MED ORDER — SILVER NITRATE-POT NITRATE 75-25 % EX MISC
10.0000 | Freq: Once | CUTANEOUS | Status: AC
  Administered 2013-12-14: 10 via TOPICAL
  Filled 2013-12-14: qty 10

## 2013-12-14 MED ORDER — OXYCODONE-ACETAMINOPHEN 5-325 MG PO TABS
2.0000 | ORAL_TABLET | Freq: Once | ORAL | Status: AC
  Administered 2013-12-14: 2 via ORAL
  Filled 2013-12-14: qty 2

## 2013-12-14 MED ORDER — LIDOCAINE HCL 1 % IJ SOLN
10.0000 mL | Freq: Once | INTRAMUSCULAR | Status: AC
  Administered 2013-12-14: 10 mL
  Filled 2013-12-14: qty 20

## 2013-12-14 NOTE — Progress Notes (Signed)
Procedure Note  DX:  Maculopapular rash on skin of unknown etiology.  Procedure:  3 mm punch biopsy of skin rash on left upper arm  Surgeon:  Zella Richer  Anes:  1% Xylocaine  Complications:  None  [place] sent to pathology.

## 2013-12-14 NOTE — Progress Notes (Signed)
There's been an absolute tragedy for Lacey [name]. Her husband had a heart attack and died yesterday. He was driving and had a heart attack in [place]. This is an absolute shock. Obviously, Lacey [name] is devastated.  This rash is getting worse. On not sure as to what is causing this. I would not think that this is from her chemotherapy as one would think that she would have this at the injection site. I will call surgery to see if they will do a biopsy.  She is just not able to go anywhere yet. This rash needs to be identified and treated.  She is trying to eat a little bit better. There's been no fever. She's had no nausea or vomiting. There's been no diarrhea.  She still has a Foley catheter in.  I wonder if inpatient rehabilitation at [facility] would be an option for her.  She got 2 units of blood yesterday. Her hemoglobin is 8.0. White cell count is 8.6. Platelet count 21.   Her vital signs are okay. She is afebrile. Blood pressure 123/52. Heart rate is 119. Her lungs still sound pretty good. I would a chest x-ray on her. She still has his tongue ulcer. There may be a new one trying to develop. I see no mucositis. Cardiac exam is tachycardic but regular. Abdomen is soft. Good bowel sounds are noted. Extremities shows no edema. She has better range of motion in the left leg.  She is not ready for any type of discharge. This rash must be dealt with. I will get surgery to do a biopsy.  I) am praying hard for her. The death of her husband is absolutely devastating and totally unexpected. I just cannot imagine her having to deal with this plus all the physical issues that are going on.    Pete E.  2 Corinthians 12:9-10

## 2013-12-14 NOTE — Consult Note (Signed)
Patient seen and examined.  Agree with PA's note.  

## 2013-12-14 NOTE — Progress Notes (Signed)
Assumed care of patient at 23:30.  I agree with previous assessment.  No changes overnight.  Family/ Friends present with patient all night.  Will continue to monitor patient. Owens Shark, Shann Merrick Cherie

## 2013-12-14 NOTE — Progress Notes (Signed)
[facility] PROGRESS NOTE  Lacey [name] EXN:[id number] DOB: 1956/04/05 DOA: 11/20/2013 PCP: Vena Austria, MD  Brief summary  58 y.o. female with history of Hodgkin's lymphoma status post chemotherapy now with myelodysplastic syndrome who initially presented with abdominal pain, nausea, vomiting, hypotension and complicated with adrenal infarct, adrenal insufficiency, HCAP, recurrent sepsis, hypotension RNF<-->ICU/pressure support+IV atx;  -She also developed hemorrhage into her left iliopsoas muscle which caused weakness and pain of her left leg and LLQ. -Dr. Marin Olp started her on [place] for her MDS. -Now waiting for insurance auth to go to SNF. -Also, may need another platelet/PRBC transfusion prior to DC.  Assessment/Plan  1. Myelodysplastic syndrome in setting of Hodgkin Lymphoma:  -Refractory anemia and thrombocytopenia requiring almost daily blood product transfusions, Although for the past 6 days counts appear to be somewhat stable.  Bone Marrow Biopsy: HYPERCELLULAR BONE MARROW FOR AGE WITH DYSPOIETIC CHANGES. -myelodysplastic/myeloproliferative process, particularly chronic myelomonocytic leukemia - Appreciate Dr Jonette Eva assistance. on frequent daily transfusions (PRBC+platelets) as patient is developing antibodies. Per hem/onc, started [place] on 3/11 -3/17: Plt down to 14. Watch closely for need for transfusion (if <10 or active bleeding). -3/17: Hb down to 7.1. -3/18 - Hb to 6.8 - 2 units PRBC's transfused with hgb of 8.0 on 3/19AM  2. Recurrent fever/septic shock, pneumonia Likely due to dysfunctional  leukocytes as above  -CT chest on 2/23: Persistent consolidation RML with new peribronchovascular opacities suggestive of bronchopneumonia. Underwent bronch on 2/26; BCx NGTD,  Diff PCR neg, BAL: PCP neg, Bacterial, fungal, and AFB cultures NGTD  - 3/6: febrile; CT: Persistent right upper lobe collapse/ consolidation with interval development of central airspace  disease in the left upper lobe  - 3/6: restarted IV atx, antifungals, acyclovir  -off pressors; clinically improving, started taper IV steroids 3/10  -3/11: PICC line is out. Will stop all antibiotics and antifungals as all cx data has been negative to date. Hopefully fevers and "sepsis" will not recur. -3/17: no further signs of sepsis since discontinuation of antibiotics.  3. Acute on chronic diastolic heart failure due to IVF from resuscitation. Echo (2/15): Preserved EF and grade 1 diastolic heart failure -R sided pleural effusion; anasarca, cont IV lasix; Daily weights and strict I/O; monitor renal function -per pulmonology deferred thoracentesis due to thrombocytopenia, R sided pleural effusion   -Is 13 L negative since admission.  4. 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  5. Acute Renal Failure due to septic shock; RUS wnl. Hypervolemic  - resumed lasix due to respiratory status; close monitor   6. Spontaneous retroperitoneal bleed in setting of thrombocytopenia; hemorrhage into her left iliopsoas muscle which caused weakness and pain of her left leg and LLQ - on fentanyl; cont pain control  -Resume PT/OT.  7. Right upper extremity superficial thrombosis, stable   8. Transaminitis due to septic shock, resolved.   9. Urge and stress incontinence; Foley placed for comfort   10. Tongue ulcer; cont as above; biopsied by ENT; path: SQUAMOUS EPITHELIUM. NO DYSPLASIA OR MALIGNANCY -improving   11. Hypokalemia -Repleted.  12. Progressive Rash - Discussed case with Dr. Marin Olp this AM - Surgery consulted for skin biopsy - await results.  Proph: Hold for thrombocytopenia, spont retroperitoneal bleed   Code Status: full  Family Communication: patient alone,  Disposition Plan: To SNF once insurance approves and ok with Dr. Marin Olp.  Consultants:  Dr. Burney Gauze (oncology)  Dr Gibson Ramp (Surgery) curbside consult   Dr. Carlyle Basques (  infectious disease)    Procedures:  11/12/2013 right upper extremity Doppler  No evidence of deep vein thrombosis involving the right upper extremity, left subclavian vein, and left internal jugular vein. There is superficial thrombosis noted in the right cephalic vein.  Old PICC removed 3/5; Left-sided PICC 03/06 >>>  Foley 3/2 >>>   -2/12 Bone marrow biopsy report pending  Echocardiogram 01/10/2013  - Left ventricle: mild focal basal hypertrophy of the septum.  -LVEF= ejection fraction was 55%. Wall motion was normal; there were no regional wall motion abnormalities.  -(grade 1 diastolic dysfunction).  - Aortic valve: There was no stenosis. Trivial regurgitation. - Mitral valve: Trivial regurgitation. - Left atrium: The atrium was mildly dilated. - Right ventricle: The cavity size was normal. Systolic function was normal. - Tricuspid valve: Peak RV-RA gradient: 67mm Hg (S). - Pulmonary arteries: PA peak pressure: 47mm Hg (S). - Inferior vena cava: The vessel was normal in size; the respirophasic diameter changes were in the normal range (=50%); findings are consistent with normal central venous pressure.  Antibiotics:  None at present  2/15 Upper extremity Dopplers demonstrated acute clot of the right cephalic vein  3/71 Chest CT demonstrated loculated right pleural effusion which does not appear to have intact  2/17 Echocardiogram demonstrates EF of 69-67% with diastolic dysfunction.  2/24 Abdominal CT demonstrated hemorrhage in the left iliacus muscle  2/23 Chest CT demonstrated persistent right pleural effusion and right middle lobe density  2/25 Head CT demonstrated sphenoid sinusitis  2/26 Bronchoscopy did not reveal an infectious organism  3/3 Lower extremity Dopplers did not detect DVTs  3/6 Chest CT> persistent collapse of the RML, RLL with a large pleural effusion. Development of a new left airspace opacity.  3/8 TTE >>> Ef 65%, no RWMA, PAP 35  torr  3/8 Abdomen CT >>> No findings consistent with sepsis, evolution of L iliac hematoma  3/8 [place] R Effusion>>>small in volume, loculated & complex appearing by [place], NO [place]   Consultants:  Oncology  HPI/Subjective: No acute events noted overnight  Objective: Filed Vitals:   12/14/13 1300  BP: 132/61  Pulse: 127  Temp: 99.1 F (37.3 C)  Resp: 20    Intake/Output Summary (Last 24 hours) at 12/14/13 1653 Last data filed at 12/14/13 1416  Gross per 24 hour  Intake 965.42 ml  Output   3560 ml  Net -2594.58 ml   Filed Weights   12/11/13 0532 12/12/13 0243 12/13/13 0538  Weight: 77.4 kg (170 lb 10.2 oz) 77.4 kg (170 lb 10.2 oz) 77.4 kg (170 lb 10.2 oz)    Exam:   General:  alert  Cardiovascular: s1,s2 rrr  Respiratory: few LL crackles   Abdomen: soft, nt, nd   Musculoskeletal: LE edema   Data Reviewed: Basic Metabolic Panel:  Recent Labs Lab 12/10/13 0421 12/11/13 0448 12/12/13 0500 12/13/13 0630 12/14/13 0410  NA 132* 135* 133* 133* 137  K 4.5 4.2 3.7 3.7 3.6*  CL 96 100 98 98 99  CO2 $Re'24 24 23 22 25  'Fno$ GLUCOSE 114* 122* 114* 109* 119*  BUN $Re'15 12 11 10 15  'kMQ$ CREATININE 0.99 1.03 1.00 1.00 1.27*  CALCIUM 8.8 8.7 8.9 8.7 8.9   Liver Function Tests:  Recent Labs Lab 12/10/13 0421 12/11/13 0448 12/12/13 0500 12/13/13 0630 12/14/13 0410  AST $Re'10 11 12 12 14  'Wcg$ ALT $R'9 9 11 13 16  'zV$ ALKPHOS 67 72 79 88 102  BILITOT 0.8 0.8 0.5 0.5 0.8  PROT 6.9 6.8 7.3  7.1 7.4  ALBUMIN 2.4* 2.4* 2.4* 2.4* 2.6*   No results found for this basename: LIPASE, AMYLASE,  in the last 168 hours No results found for this basename: AMMONIA,  in the last 168 hours CBC:  Recent Labs Lab 12/10/13 0421 12/11/13 0448 12/12/13 0500 12/13/13 0630 12/13/13 2245 12/14/13 0410  WBC 7.2 7.5 6.8 6.1  --  8.6  NEUTROABS 4.2 5.0 4.1 3.4  --  5.4  HGB 7.8* 7.6* 7.1* 6.8* 9.9* 8.0*  HCT 24.3* 23.6* 21.5* 20.7* 29.7* 23.6*  MCV 88.0 88.4 86.7 87.0  --  84.6  PLT 14* 12* 14* 18*  --   21*   Cardiac Enzymes: No results found for this basename: CKTOTAL, CKMB, CKMBINDEX, TROPONINI,  in the last 168 hours BNP (last 3 results)  Recent Labs  11/14/13 0435 11/17/13 0800 11/20/13 0515  PROBNP 2977.0* 9541.0* 3178.0*   CBG: No results found for this basename: GLUCAP,  in the last 168 hours  Recent Results (from the past 240 hour(s))  CLOSTRIDIUM DIFFICILE BY PCR     Status: None   Collection Time    12/06/13 10:51 AM      Result Value Ref Range Status   C difficile by pcr NEGATIVE  NEGATIVE Final   Comment: Performed at [facility]     Studies: Dg Chest 2 View  12/14/2013   CLINICAL DATA:  Lymphoma, cough  EXAM: CHEST  2 VIEW  COMPARISON:  CT ABD/PELV WO CM dated 12/11/2013; DG CHEST 1V PORT dated 12/05/2013  FINDINGS: Stable enlarged cardiac silhouette. Small right effusion is again noted. There is bibasilar mild atelectasis. No focal consolidation. No pneumothorax.  IMPRESSION: Cardiomegaly and right effusion similar to comparison CT.   Electronically Signed   By: Suzy Bouchard M.D.   On: 12/14/2013 09:10    Scheduled Meds: . DULoxetine  60 mg Oral QPM  . fluticasone  2 spray Each Nare Daily  . hydrocortisone  10 mg Oral BID  . ondansetron (ZOFRAN) IV  8 mg Intravenous 3 times per day  . pantoprazole  40 mg Oral QHS  . sodium chloride  10-40 mL Intracatheter Q12H  . sodium chloride  3 mL Intravenous Q12H   Continuous Infusions:   Active Problems:   Myelodysplasia   Symptomatic anemia   Adrenal infarction   Acute renal failure   Acute on chronic diastolic heart failure   Altered mental status   Acute-on-chronic respiratory failure   Hypotension, unspecified   Severe sepsis   DVT (deep venous thrombosis)   Pleural effusion   Thrombocytopenia   Hypokalemia  Time spent: 25 minutes   Lacey [name], [facility] Pager [phone]. If 7PM-7AM, please contact night-coverage at [email or web address], password [email or web address] 12/14/2013, 4:53 PM  LOS: 37 days

## 2013-12-14 NOTE — Progress Notes (Signed)
OT Cancellation Note  Patient Details Name: Lacey [name] MRN: [id number] DOB: 1956-01-13   Cancelled Treatment:    Reason Eval/Treat Not Completed: Other (comment).  Noted that husband died unexpectedly yesterday.  Will check back another day and update goals on our next visit.    [name] 12/14/2013, 10:10 AM [name], OTR/L [phone] 12/14/2013

## 2013-12-14 NOTE — Progress Notes (Signed)
PT Cancellation Note  Patient Details Name: Lacey [name] MRN: [id number] DOB: June 09, 1956   Cancelled Treatment:    Reason Eval/Treat Not Completed: noted pt received bad news on yesterday. Spoke with SW who recommended PT/OT hold therapy today. Will check back on tomorrow.   Weston Anna, MPT Pager: [phone]

## 2013-12-14 NOTE — Consult Note (Signed)
Reason for Consult: skin rash biopsy Referring Physician: Dr. Burney Gauze  Lacey [name] is an 58 y.o. female.  HPI: 58 y/o woman who has been here since 2/20 with pyelonephritis, pneumonia, septic shock and multiple medical issues.  She has Hodgkin's lymphoma with myelodysplastic syndrome. Acute on chronic CHF, adrenal insuffiencey, acute renal failure, retroperitoneal bleed into left iliopsoas muscle.  She has been undergoing chemo therapy with Vidaza. She has developed a rash over the last 5 days that started upper arms, and has now moved over the last 24 hours or more to her back, some upper chest, and now thighs.  Dr Marin Olp has been following her and ask [place] to do a skin biopsy to help determine the cause and treatment for the rash. Of note her husband died yesterday while driving his truck and arrangements are being made.   Past Medical History  Diagnosis Date   Hodgkin's Lymphoma with myelodysplastic syndrome Currently on chemotherapy   Pneumonia/plureal effusions    Acute renal insuffiencey     CHF, acute on chronic    . Cough productive of clear sputum 01/04/2013  . Fibromyalgia     Past Surgical History  Procedure Laterality Date  . Bone marrow biopsy  05/20110  . Spine surgery  1998    c-4 c 5 fusion  . Hodgkin      Family History  Problem Relation Age of Onset  . Cancer Mother   . Cancer Father     Social History:  reports that she has never smoked. She has never used smokeless tobacco. She reports that she drinks alcohol. She reports that she does not use illicit drugs.  Allergies:  Allergies  Allergen Reactions  . Penicillins Hives and Rash  . Contrast Media [Iodinated Diagnostic Agents]     Medications:  Prior to Admission:  Prescriptions prior to admission  Medication Sig Dispense Refill  . cholecalciferol (VITAMIN D) 1000 UNITS tablet Take 1,000 Units by mouth daily.      . DULoxetine (CYMBALTA) 60 MG capsule Take 60 mg by mouth every evening.       . gabapentin (NEURONTIN) 300 MG capsule Take 600 mg by mouth 2 (two) times daily.       Nyoka Cowden Tea 250 MG CAPS Take by mouth every morning.      Marland Kitchen amoxicillin (AMOXIL) 500 MG capsule Take 500 mg by mouth 3 (three) times daily. For 7 days.       Scheduled: . DULoxetine  60 mg Oral QPM  . fluticasone  2 spray Each Nare Daily  . hydrocortisone  10 mg Oral BID  . ondansetron (ZOFRAN) IV  8 mg Intravenous 3 times per day  . pantoprazole  40 mg Oral QHS  . sodium chloride  10-40 mL Intracatheter Q12H  . sodium chloride  3 mL Intravenous Q12H   Continuous:  RRN:[id number], ALPRAZolam, diphenhydrAMINE, diphenhydrAMINE-zinc acetate, feeding supplement (ENSURE COMPLETE), fentaNYL, guaifenesin, ipratropium-albuterol, sodium chloride Anti-infectives   Start     Dose/Rate Route Frequency Ordered Stop   12/02/13 2230  ceFEPIme (MAXIPIME) 1 g in dextrose 5 % 50 mL IVPB  Status:  Discontinued     1 g 100 mL/hr over 30 Minutes Intravenous Every 12 hours 12/02/13 2223 12/06/13 1724   12/02/13 2200  linezolid (ZYVOX) IVPB 600 mg  Status:  Discontinued     600 mg 300 mL/hr over 60 Minutes Intravenous Every 12 hours 12/02/13 2145 12/06/13 1724   12/02/13 2200  micafungin (MYCAMINE) 100 mg in  sodium chloride 0.9 % 100 mL IVPB  Status:  Discontinued     100 mg 100 mL/hr over 1 Hours Intravenous Every 24 hours 12/02/13 2145 12/06/13 1724   12/02/13 1200  vancomycin (VANCOCIN) 1,250 mg in sodium chloride 0.9 % 250 mL IVPB  Status:  Discontinued     1,250 mg 166.7 mL/hr over 90 Minutes Intravenous Every 24 hours 12/01/13 0802 12/02/13 2115   12/01/13 2200  ceFEPIme (MAXIPIME) 1 g in dextrose 5 % 50 mL IVPB  Status:  Discontinued     1 g 100 mL/hr over 30 Minutes Intravenous Every 12 hours 12/01/13 2116 12/02/13 2115   12/01/13 0915  ceFEPIme (MAXIPIME) 2 g in dextrose 5 % 50 mL IVPB     2 g 100 mL/hr over 30 Minutes Intravenous STAT 12/01/13 0904 12/01/13 1111   12/01/13 0800  vancomycin (VANCOCIN)  2,000 mg in sodium chloride 0.9 % 500 mL IVPB     2,000 mg 250 mL/hr over 120 Minutes Intravenous  Once 12/01/13 0745 12/01/13 1241   11/30/13 1000  fluconazole (DIFLUCAN) IVPB 200 mg  Status:  Discontinued     200 mg 100 mL/hr over 60 Minutes Intravenous Every 24 hours 11/30/13 0752 12/02/13 2115   11/30/13 0900  acyclovir (ZOVIRAX) 230 mg in dextrose 5 % 100 mL IVPB  Status:  Discontinued     5 mg/kg  45.5 kg (Ideal) 104.6 mL/hr over 60 Minutes Intravenous Every 8 hours 11/30/13 0752 12/06/13 1724   11/25/13 1800  ceFEPIme (MAXIPIME) 2 g in dextrose 5 % 50 mL IVPB  Status:  Discontinued     2 g 100 mL/hr over 30 Minutes Intravenous Every 24 hours 11/25/13 1243 11/27/13 0750   11/24/13 0800  vancomycin (VANCOCIN) 1,250 mg in sodium chloride 0.9 % 250 mL IVPB  Status:  Discontinued     1,250 mg 166.7 mL/hr over 90 Minutes Intravenous Every 24 hours 11/24/13 0706 11/27/13 0740   11/04/2013 1800  ceFEPIme (MAXIPIME) 1 g in dextrose 5 % 50 mL IVPB  Status:  Discontinued     1 g 100 mL/hr over 30 Minutes Intravenous Every 24 hours 11/22/13 1944 11/25/13 1243   11/02/2013 1000  voriconazole (VFEND) tablet 200 mg  Status:  Discontinued     200 mg Oral Every 12 hours 11/24/2013 0842 11/25/13 1214   11/05/2013 0800  vancomycin (VANCOCIN) 1,250 mg in sodium chloride 0.9 % 250 mL IVPB     1,250 mg 166.7 mL/hr over 90 Minutes Intravenous  Once 11/12/2013 0618 11/19/2013 0930   11/22/13 2000  vancomycin (VANCOCIN) 1,250 mg in sodium chloride 0.9 % 250 mL IVPB  Status:  Discontinued     1,250 mg 166.7 mL/hr over 90 Minutes Intravenous Every 24 hours 11/22/13 1720 11/22/13 1926   11/22/13 1800  ceFEPIme (MAXIPIME) 2 g in dextrose 5 % 50 mL IVPB  Status:  Discontinued     2 g 100 mL/hr over 30 Minutes Intravenous Every 24 hours 11/22/13 1720 11/22/13 1944   11/22/13 1000  acyclovir (ZOVIRAX) 200 MG capsule 800 mg  Status:  Discontinued     800 mg Oral 3 times daily 11/22/13 0714 11/22/13 0727   11/22/13 1000   acyclovir (ZOVIRAX) tablet 800 mg  Status:  Discontinued     800 mg Oral 3 times daily 11/22/13 0727 11/22/13 1942   11/18/13 1000  acyclovir (ZOVIRAX) 200 MG capsule 400 mg  Status:  Discontinued     400 mg Oral 3 times daily 11/18/13  8592 11/19/13 0804   11/18/13 1000  fluconazole (DIFLUCAN) tablet 100 mg  Status:  Discontinued     100 mg Oral Daily 11/18/13 0734 10/29/2013 0807   11/16/13 1200  imipenem-cilastatin (PRIMAXIN) 500 mg in sodium chloride 0.9 % 100 mL IVPB  Status:  Discontinued     500 mg 200 mL/hr over 30 Minutes Intravenous Every 8 hours 11/16/13 0731 11/18/13 0734   11/15/13 1000  vancomycin (VANCOCIN) 1,250 mg in sodium chloride 0.9 % 250 mL IVPB     1,250 mg 166.7 mL/hr over 90 Minutes Intravenous Every 24 hours 11/14/13 0852 11/21/13 1125   11/14/13 1600  imipenem-cilastatin (PRIMAXIN) 250 mg in sodium chloride 0.9 % 100 mL IVPB  Status:  Discontinued     250 mg 200 mL/hr over 30 Minutes Intravenous 4 times per day 11/14/13 0856 11/16/13 0731   11/14/13 1000  voriconazole (VFEND) 360 mg in sodium chloride 0.9 % 150 mL IVPB  Status:  Discontinued     4 mg/kg  89.3 kg 93 mL/hr over 120 Minutes Intravenous Every 12 hours 11/13/13 0806 11/15/13 0807   11/14/13 1000  acyclovir (ZOVIRAX) 230 mg in dextrose 5 % 100 mL IVPB  Status:  Discontinued     5 mg/kg  45.5 kg (Ideal) 104.6 mL/hr over 60 Minutes Intravenous Every 12 hours 11/14/13 0840 11/17/13 0714   11/14/13 0900  vancomycin (VANCOCIN) 2,000 mg in sodium chloride 0.9 % 500 mL IVPB     2,000 mg 250 mL/hr over 120 Minutes Intravenous  Once 11/14/13 0851 11/14/13 1456   11/14/13 0745  acyclovir (ZOVIRAX) 485 mg in dextrose 5 % 100 mL IVPB  Status:  Discontinued     5 mg/kg  96.6 kg 109.7 mL/hr over 60 Minutes Intravenous 3 times per day 11/14/13 0742 11/14/13 0839   11/13/13 1600  imipenem-cilastatin (PRIMAXIN) 500 mg in sodium chloride 0.9 % 100 mL IVPB  Status:  Discontinued     500 mg 200 mL/hr over 30 Minutes  Intravenous Every 8 hours 11/13/13 0752 11/14/13 0856   11/13/13 0900  voriconazole (VFEND) 540 mg in sodium chloride 0.9 % 150 mL IVPB     6 mg/kg  89.3 kg 102 mL/hr over 120 Minutes Intravenous Every 12 hours 11/13/13 0806 11/14/13 0110   11/13/13 0830  imipenem-cilastatin (PRIMAXIN) 500 mg in sodium chloride 0.9 % 100 mL IVPB     500 mg 200 mL/hr over 30 Minutes Intravenous STAT 11/13/13 0750 11/13/13 1127   11/13/13 0730  ceFEPIme (MAXIPIME) 1 g in dextrose 5 % 50 mL IVPB  Status:  Discontinued     1 g 100 mL/hr over 30 Minutes Intravenous 3 times per day 11/13/13 0718 11/13/13 0750   11/10/13 2000  vancomycin (VANCOCIN) 500 mg in sodium chloride 0.9 % 100 mL IVPB  Status:  Discontinued     500 mg 100 mL/hr over 60 Minutes Intravenous Every 12 hours 11/10/13 1824 11/12/13 1500   11/08/13 1800  vancomycin (VANCOCIN) IVPB 750 mg/150 ml premix  Status:  Discontinued     750 mg 150 mL/hr over 60 Minutes Intravenous Every 12 hours 11/08/13 0307 11/10/13 1824   11/08/13 1000  ciprofloxacin (CIPRO) IVPB 400 mg  Status:  Discontinued     400 mg 200 mL/hr over 60 Minutes Intravenous Every 12 hours 11/02/2013 2325 11/11/13 0812   11/08/13 0600  aztreonam (AZACTAM) 1 g in dextrose 5 % 50 mL IVPB  Status:  Discontinued     1 g 100 mL/hr  over 30 Minutes Intravenous 3 times per day 11/08/13 0307 11/10/13 1356   11/08/13 0030  vancomycin (VANCOCIN) IVPB 1000 mg/200 mL premix     1,000 mg 200 mL/hr over 60 Minutes Intravenous  Once 11/08/13 0020 11/08/13 0354   11/08/13 0000  aztreonam (AZACTAM) 2 g in dextrose 5 % 50 mL IVPB     2 g 100 mL/hr over 30 Minutes Intravenous  Once 11/12/2013 2357 11/08/13 0151   11/21/2013 2130  ciprofloxacin (CIPRO) IVPB 400 mg     400 mg 200 mL/hr over 60 Minutes Intravenous  Once 11/19/2013 2111 11/06/2013 2325      Results for orders placed during the hospital encounter of 11/15/2013 (from the past 48 hour(s))  LACTATE DEHYDROGENASE     Status: Abnormal   Collection  Time    12/13/13  6:30 AM      Result Value Ref Range   LDH 261 (*) 94 - 250 U/L  COMPREHENSIVE METABOLIC PANEL     Status: Abnormal   Collection Time    12/13/13  6:30 AM      Result Value Ref Range   Sodium 133 (*) 137 - 147 mEq/L   Potassium 3.7  3.7 - 5.3 mEq/L   Chloride 98  96 - 112 mEq/L   CO2 22  19 - 32 mEq/L   Glucose, Bld 109 (*) 70 - 99 mg/dL   BUN 10  6 - 23 mg/dL   Creatinine, Ser 1.00  0.50 - 1.10 mg/dL   Calcium 8.7  8.4 - 10.5 mg/dL   Total Protein 7.1  6.0 - 8.3 g/dL   Albumin 2.4 (*) 3.5 - 5.2 g/dL   AST 12  0 - 37 U/L   ALT 13  0 - 35 U/L   Alkaline Phosphatase 88  39 - 117 U/L   Total Bilirubin 0.5  0.3 - 1.2 mg/dL   GFR calc non Af Amer 60 (*) >90 mL/min   GFR calc Af Amer 70 (*) >90 mL/min   Comment: (NOTE)     The eGFR has been calculated using the CKD EPI equation.     This calculation has not been validated in all clinical situations.     eGFR's persistently <90 mL/min signify possible Chronic Kidney     Disease.  CBC WITH DIFFERENTIAL     Status: Abnormal   Collection Time    12/13/13  6:30 AM      Result Value Ref Range   WBC 6.1  4.0 - 10.5 K/uL   RBC 2.38 (*) 3.87 - 5.11 MIL/uL   Hemoglobin 6.8 (*) 12.0 - 15.0 g/dL   Comment: REPEATED TO VERIFY     CRITICAL RESULT CALLED TO, READ BACK BY AND VERIFIED WITH:     Dayna Barker RN AT 0710 ON 03.18.15 BY SHUEA   HCT 20.7 (*) 36.0 - 46.0 %   MCV 87.0  78.0 - 100.0 fL   MCH 28.6  26.0 - 34.0 pg   MCHC 32.9  30.0 - 36.0 g/dL   RDW 17.6 (*) 11.5 - 15.5 %   Platelets 18 (*) 150 - 400 K/uL   Comment: REPEATED TO VERIFY     CRITICAL RESULT CALLED TO, READ BACK BY AND VERIFIED WITH:     Dayna Barker RN AT 0710 ON 03.18.15 BY SHUEA     CONSISTENT WITH PREVIOUS RESULT   Neutrophils Relative % 57  43 - 77 %   Lymphocytes Relative 34  12 - 46 %  Monocytes Relative 6  3 - 12 %   Eosinophils Relative 2  0 - 5 %   Basophils Relative 1  0 - 1 %   Neutro Abs 3.4  1.7 - 7.7 K/uL   Lymphs Abs 2.1  0.7 - 4.0  K/uL   Monocytes Absolute 0.4  0.1 - 1.0 K/uL   Eosinophils Absolute 0.1  0.0 - 0.7 K/uL   Basophils Absolute 0.1  0.0 - 0.1 K/uL   WBC Morphology       Value: MODERATE LEFT SHIFT (>5% METAS AND MYELOS,OCC PRO NOTED)  RETICULOCYTES     Status: Abnormal   Collection Time    12/13/13  6:30 AM      Result Value Ref Range   Retic Ct Pct <0.4 (*) 0.4 - 3.1 %   Comment: REPEATED TO VERIFY   RBC. 2.38 (*) 3.87 - 5.11 MIL/uL   Retic Count, Manual NOT CALCULATED  19.0 - 186.0 K/uL  PREPARE RBC (CROSSMATCH)     Status: None   Collection Time    12/13/13  8:00 AM      Result Value Ref Range   Order Confirmation ORDER PROCESSED BY BLOOD BANK    TYPE AND SCREEN     Status: None   Collection Time    12/13/13  9:20 AM      Result Value Ref Range   ABO/RH(D) O POS     Antibody Screen POS     Sample Expiration 12/16/2013     Antibody Identification ANTI JKA (Kidd a)     DAT, IgG NEG     Unit Number [id number]     Blood Component Type RBC, LR IRR     Unit division 00     Status of Unit ISSUED,FINAL     Donor AG Type NEGATIVE FOR KIDD A ANTIGEN     Transfusion Status OK TO TRANSFUSE     Crossmatch Result COMPATIBLE     Unit Number [id number]     Blood Component Type RBC, LR IRR     Unit division 00     Status of Unit ISSUED,FINAL     Donor AG Type NEGATIVE FOR KIDD A ANTIGEN     Transfusion Status OK TO TRANSFUSE     Crossmatch Result COMPATIBLE    PREPARE RBC (CROSSMATCH)     Status: None   Collection Time    12/13/13 11:30 AM      Result Value Ref Range   Order Confirmation ORDER PROCESSED BY BLOOD BANK    HEMOGLOBIN AND HEMATOCRIT, BLOOD     Status: Abnormal   Collection Time    12/13/13 10:45 PM      Result Value Ref Range   Hemoglobin 9.9 (*) 12.0 - 15.0 g/dL   Comment: DELTA CHECK NOTED     POST TRANSFUSION SPECIMEN   HCT 29.7 (*) 36.0 - 46.0 %  LACTATE DEHYDROGENASE     Status: Abnormal   Collection Time    12/14/13  4:10 AM      Result Value Ref Range   LDH 315  (*) 94 - 250 U/L  COMPREHENSIVE METABOLIC PANEL     Status: Abnormal   Collection Time    12/14/13  4:10 AM      Result Value Ref Range   Sodium 137  137 - 147 mEq/L   Potassium 3.6 (*) 3.7 - 5.3 mEq/L   Chloride 99  96 - 112 mEq/L   CO2 25  19 - 32 mEq/L  Glucose, Bld 119 (*) 70 - 99 mg/dL   BUN 15  6 - 23 mg/dL   Creatinine, Ser 1.27 (*) 0.50 - 1.10 mg/dL   Calcium 8.9  8.4 - 10.5 mg/dL   Total Protein 7.4  6.0 - 8.3 g/dL   Albumin 2.6 (*) 3.5 - 5.2 g/dL   AST 14  0 - 37 U/L   ALT 16  0 - 35 U/L   Alkaline Phosphatase 102  39 - 117 U/L   Total Bilirubin 0.8  0.3 - 1.2 mg/dL   GFR calc non Af Amer 45 (*) >90 mL/min   GFR calc Af Amer 52 (*) >90 mL/min   Comment: (NOTE)     The eGFR has been calculated using the CKD EPI equation.     This calculation has not been validated in all clinical situations.     eGFR's persistently <90 mL/min signify possible Chronic Kidney     Disease.  CBC WITH DIFFERENTIAL     Status: Abnormal   Collection Time    12/14/13  4:10 AM      Result Value Ref Range   WBC 8.6  4.0 - 10.5 K/uL   RBC 2.79 (*) 3.87 - 5.11 MIL/uL   Hemoglobin 8.0 (*) 12.0 - 15.0 g/dL   Comment: DELTA CHECK NOTED     REPEATED TO VERIFY   HCT 23.6 (*) 36.0 - 46.0 %   MCV 84.6  78.0 - 100.0 fL   MCH 28.7  26.0 - 34.0 pg   MCHC 33.9  30.0 - 36.0 g/dL   RDW 17.3 (*) 11.5 - 15.5 %   Platelets 21 (*) 150 - 400 K/uL   Comment: REPEATED TO VERIFY     CRITICAL VALUE NOTED.  VALUE IS CONSISTENT WITH PREVIOUSLY REPORTED AND CALLED VALUE.   Neutrophils Relative % 63  43 - 77 %   Lymphocytes Relative 24  12 - 46 %   Monocytes Relative 7  3 - 12 %   Eosinophils Relative 5  0 - 5 %   Basophils Relative 1  0 - 1 %   Neutro Abs 5.4  1.7 - 7.7 K/uL   Lymphs Abs 2.1  0.7 - 4.0 K/uL   Monocytes Absolute 0.6  0.1 - 1.0 K/uL   Eosinophils Absolute 0.4  0.0 - 0.7 K/uL   Basophils Absolute 0.1  0.0 - 0.1 K/uL   Smear Review       Value: MODERATE LEFT SHIFT (>5% METAS AND MYELOS,OCC PRO  NOTED)  RETICULOCYTES     Status: Abnormal   Collection Time    12/14/13  4:10 AM      Result Value Ref Range   Retic Ct Pct <0.4 (*) 0.4 - 3.1 %   Comment: REPEATED TO VERIFY   RBC. 2.79 (*) 3.87 - 5.11 MIL/uL   Retic Count, Manual NOT CALCULATED  19.0 - 186.0 K/uL    Dg Chest 2 View  12/14/2013   CLINICAL DATA:  Lymphoma, cough  EXAM: CHEST  2 VIEW  COMPARISON:  CT ABD/PELV WO CM dated 12/11/2013; DG CHEST 1V PORT dated 12/05/2013  FINDINGS: Stable enlarged cardiac silhouette. Small right effusion is again noted. There is bibasilar mild atelectasis. No focal consolidation. No pneumothorax.  IMPRESSION: Cardiomegaly and right effusion similar to comparison CT.   Electronically Signed   By: Suzy Bouchard M.D.   On: 12/14/2013 09:10    Review of Systems  All other systems reviewed and are negative.   Blood  pressure 123/52, pulse 119, temperature 97.8 F (36.6 C), temperature source Oral, resp. rate 20, height 5' (1.524 m), weight 77.4 kg (170 lb 10.2 oz), SpO2 96.00%. Physical Exam  Constitutional: She is oriented to person, place, and time. She appears well-developed and well-nourished. No distress.  HENT:  Head: Normocephalic and atraumatic.  Nose: Nose normal.  Eyes: Conjunctivae and EOM are normal. Pupils are equal, round, and reactive to light. Right eye exhibits no discharge. Left eye exhibits no discharge. No scleral icterus.  Neck: Normal range of motion. Neck supple. No JVD present. No tracheal deviation present. No thyromegaly present.  Cardiovascular: Normal rate and regular rhythm.   Respiratory: Effort normal and breath sounds normal.  GI: Soft. Bowel sounds are normal. She exhibits no distension and no mass. There is no tenderness. There is no rebound and no guarding.  Musculoskeletal: She exhibits no edema and no tenderness.  Lymphadenopathy:    She has no cervical adenopathy.  Neurological: She is alert and oriented to person, place, and time. No cranial nerve  deficit.  Skin: She is not diaphoretic.  She has rash both upper arms. Left upper arm more erythematous than the right. She has rash now on her chest, especially her back, some on her buttocks and now both anterior thighs.  It has moved from her arms to main trunk over the last 24 hours.  Psychiatric: She has a normal mood and affect. Her behavior is normal. Judgment and thought content normal.    Assessment/Plan: 1.  Progressive skin rash 2.  Hodgkin's lymphoma with myelodysplastic syndrome 3.  Sepsis/pneumonia 4.  Acute adrenal Insuffiencey 5.  Acute real failure, improving 6.  Hx of fibromyalgia 7.  Pneumonia/plureal effusons  Plan:  She was seen by Dr. Zella Richer and underwent skin biopsy to the left upper arm.  Biopsy labeled and give to nurse, form for pathology filled out and order was sent. Lacey [name] 12/14/2013, 1:32 PM

## 2013-12-15 ENCOUNTER — Inpatient Hospital Stay (HOSPITAL_COMMUNITY): Payer: BC Managed Care – PPO

## 2013-12-15 DIAGNOSIS — I5032 Chronic diastolic (congestive) heart failure: Secondary | ICD-10-CM

## 2013-12-15 DIAGNOSIS — I509 Heart failure, unspecified: Secondary | ICD-10-CM

## 2013-12-15 DIAGNOSIS — D649 Anemia, unspecified: Secondary | ICD-10-CM

## 2013-12-15 DIAGNOSIS — R0789 Other chest pain: Secondary | ICD-10-CM

## 2013-12-15 DIAGNOSIS — R109 Unspecified abdominal pain: Secondary | ICD-10-CM

## 2013-12-15 DIAGNOSIS — D696 Thrombocytopenia, unspecified: Secondary | ICD-10-CM

## 2013-12-15 DIAGNOSIS — E279 Disorder of adrenal gland, unspecified: Secondary | ICD-10-CM

## 2013-12-15 LAB — COMPREHENSIVE METABOLIC PANEL
ALBUMIN: 2.6 g/dL — AB (ref 3.5–5.2)
ALT: 13 U/L (ref 0–35)
AST: 13 U/L (ref 0–37)
Alkaline Phosphatase: 102 U/L (ref 39–117)
BILIRUBIN TOTAL: 0.7 mg/dL (ref 0.3–1.2)
BUN: 17 mg/dL (ref 6–23)
CO2: 23 mEq/L (ref 19–32)
Calcium: 9.2 mg/dL (ref 8.4–10.5)
Chloride: 101 mEq/L (ref 96–112)
Creatinine, Ser: 1.21 mg/dL — ABNORMAL HIGH (ref 0.50–1.10)
GFR calc non Af Amer: 48 mL/min — ABNORMAL LOW (ref >90)
GFR, EST AFRICAN AMERICAN: 56 mL/min — AB (ref >90)
Glucose, Bld: 123 mg/dL — ABNORMAL HIGH (ref 70–99)
Potassium: 3.5 mEq/L — ABNORMAL LOW (ref 3.7–5.3)
Sodium: 138 mEq/L (ref 137–147)
Total Protein: 7.5 g/dL (ref 6.0–8.3)

## 2013-12-15 LAB — CBC WITH DIFFERENTIAL/PLATELET
Basophils Absolute: 0.1 10*3/uL (ref 0.0–0.1)
Basophils Relative: 1 % (ref 0–1)
Eosinophils Absolute: 0.5 10*3/uL (ref 0.0–0.7)
Eosinophils Relative: 6 % — ABNORMAL HIGH (ref 0–5)
HCT: 30.3 % — ABNORMAL LOW (ref 36.0–46.0)
Hemoglobin: 10.3 g/dL — ABNORMAL LOW (ref 12.0–15.0)
LYMPHS ABS: 1.5 10*3/uL (ref 0.7–4.0)
LYMPHS PCT: 17 % (ref 12–46)
MCH: 28.5 pg (ref 26.0–34.0)
MCHC: 34 g/dL (ref 30.0–36.0)
MCV: 83.7 fL (ref 78.0–100.0)
MONOS PCT: 9 % (ref 3–12)
Monocytes Absolute: 0.8 10*3/uL (ref 0.1–1.0)
NEUTROS PCT: 67 % (ref 43–77)
Neutro Abs: 5.9 10*3/uL (ref 1.7–7.7)
PLATELETS: 25 10*3/uL — AB (ref 150–400)
RBC: 3.62 MIL/uL — AB (ref 3.87–5.11)
RDW: 17.1 % — ABNORMAL HIGH (ref 11.5–15.5)
WBC: 8.8 10*3/uL (ref 4.0–10.5)

## 2013-12-15 LAB — LACTATE DEHYDROGENASE: LDH: 370 U/L — ABNORMAL HIGH (ref 94–250)

## 2013-12-15 LAB — AMYLASE: Amylase: 86 U/L (ref 0–105)

## 2013-12-15 LAB — RETICULOCYTES
RBC.: 3.62 MIL/uL — AB (ref 3.87–5.11)
Retic Ct Pct: <0.4 % — ABNORMAL LOW (ref 0.4–3.1)

## 2013-12-15 LAB — LIPASE, BLOOD: Lipase: 57 U/L (ref 11–59)

## 2013-12-15 MED ORDER — SODIUM CHLORIDE 0.9 % IV SOLN
INTRAVENOUS | Status: DC
  Administered 2013-12-15 – 2013-12-20 (×5): via INTRAVENOUS

## 2013-12-15 MED ORDER — PROMETHAZINE HCL 25 MG/ML IJ SOLN
12.5000 mg | Freq: Four times a day (QID) | INTRAMUSCULAR | Status: DC | PRN
  Administered 2013-12-15 – 2013-12-18 (×5): 12.5 mg via INTRAVENOUS
  Filled 2013-12-15 (×5): qty 1

## 2013-12-15 MED ORDER — ONDANSETRON HCL 4 MG/2ML IJ SOLN
4.0000 mg | Freq: Four times a day (QID) | INTRAMUSCULAR | Status: DC | PRN
  Administered 2013-12-15 – 2013-12-24 (×11): 4 mg via INTRAVENOUS
  Filled 2013-12-15 (×13): qty 2

## 2013-12-15 NOTE — Progress Notes (Signed)
CSW received call from [facility]. They have auth for patient so they can bring patient in whenever she is ready. CSW discussed circumstances. They will put patient in private room when she is ready so she can have privacy and/or family stay with her when she goes to rehab. They will also be able to make sure she gets spiritual/emotional support.  Patti Shorb C. [place] MSW, [place]

## 2013-12-15 NOTE — Progress Notes (Signed)
  Subjective: She is very tired this Am, biopsy site is OK.  Objective: Vital signs in last 24 hours: Temp:  [98 F (36.7 C)-99.1 F (37.3 C)] 98 F (36.7 C) (03/20 0442) Pulse Rate:  [124-129] 129 (03/20 0442) Resp:  [18-20] 18 (03/20 0442) BP: (109-132)/(59-69) 112/69 mmHg (03/20 0442) SpO2:  [97 %-100 %] 97 % (03/20 0442) Weight:  [76.2 kg (167 lb 15.9 oz)] 76.2 kg (167 lb 15.9 oz) (03/20 0442) Last BM Date: 12/12/13  Intake/Output from previous day: 03/19 0701 - 03/20 0700 In: 240 [P.O.:240] Out: 675 [Urine:675] Intake/Output this shift:    Skin: Biopsy site looks fine.  Lab Results:   Recent Labs  12/14/13 0410 12/15/13 0405  WBC 8.6 8.8  HGB 8.0* 10.3*  HCT 23.6* 30.3*  PLT 21* 25*    BMET  Recent Labs  12/14/13 0410 12/15/13 0405  NA 137 138  K 3.6* 3.5*  CL 99 101  CO2 25 23  GLUCOSE 119* 123*  BUN 15 17  CREATININE 1.27* 1.21*  CALCIUM 8.9 9.2   PT/INR No results found for this basename: LABPROT, INR,  in the last 72 hours   Recent Labs Lab 12/11/13 0448 12/12/13 0500 12/13/13 0630 12/14/13 0410 12/15/13 0405  AST 11 12 12 14 13   ALT 9 11 13 16 13   ALKPHOS 72 79 88 102 102  BILITOT 0.8 0.5 0.5 0.8 0.7  PROT 6.8 7.3 7.1 7.4 7.5  ALBUMIN 2.4* 2.4* 2.4* 2.6* 2.6*     Lipase     Component Value Date/Time   LIPASE 57 12/15/2013 0405     Studies/Results: Dg Chest 2 View  12/14/2013   CLINICAL DATA:  Lymphoma, cough  EXAM: CHEST  2 VIEW  COMPARISON:  CT ABD/PELV WO CM dated 12/11/2013; DG CHEST 1V PORT dated 12/05/2013  FINDINGS: Stable enlarged cardiac silhouette. Small right effusion is again noted. There is bibasilar mild atelectasis. No focal consolidation. No pneumothorax.  IMPRESSION: Cardiomegaly and right effusion similar to comparison CT.   Electronically Signed   By: Suzy Bouchard M.D.   On: 12/14/2013 09:10    Medications: . DULoxetine  60 mg Oral QPM  . fluticasone  2 spray Each Nare Daily  . hydrocortisone  10 mg  Oral BID  . pantoprazole  40 mg Oral QHS  . sodium chloride  10-40 mL Intracatheter Q12H  . sodium chloride  3 mL Intravenous Q12H    Assessment/Plan 1. Progressive skin rash  2. Hodgkin's lymphoma with myelodysplastic syndrome  3. Sepsis/pneumonia  4. Acute adrenal Insuffiencey  5. Acute real failure, improving  6. Hx of fibromyalgia  7. Pneumonia/plureal effusons  Plan:  She can have the suture removed from left arm biopsy site in 7 days.  Call us if we can be of any further assistance.   LOS: 38 days    Endi Lagman 12/15/2013

## 2013-12-15 NOTE — Progress Notes (Signed)
[facility] PROGRESS NOTE  Lacey [name] YWV:[id number] DOB: [birth year] DOA: 11/15/2013 PCP: Vena Austria, MD  Brief summary  58 y.o. female with history of Hodgkin's lymphoma status post chemotherapy now with myelodysplastic syndrome who initially presented with abdominal pain, nausea, vomiting, hypotension and complicated with adrenal infarct, adrenal insufficiency, HCAP, recurrent sepsis, hypotension RNF<-->ICU/pressure support+IV atx;  -She also developed hemorrhage into her left iliopsoas muscle which caused weakness and pain of her left leg and LLQ. -Dr. Marin Olp started her on [place] for her MDS. -Now waiting for insurance auth to go to SNF. -Also, may need another platelet/PRBC transfusion prior to DC.  Assessment/Plan  1. Myelodysplastic syndrome in setting of Hodgkin Lymphoma:  -Refractory anemia and thrombocytopenia requiring almost daily blood product transfusions, Although for the past 6 days counts appear to be somewhat stable.  Bone Marrow Biopsy: HYPERCELLULAR BONE MARROW FOR AGE WITH DYSPOIETIC CHANGES. -myelodysplastic/myeloproliferative process, particularly chronic myelomonocytic leukemia - Appreciate Dr Jonette Eva assistance. on frequent daily transfusions (PRBC+platelets) as patient is developing antibodies. Per hem/onc, started Vidaza on 3/11 -3/17: Plt down to 14. Watch closely for need for transfusion (if <10 or active bleeding). -3/17: Hb down to 7.1. -3/18 - Hb to 6.8 - 2 units PRBC's transfused with hgb of 8.0 on 3/19AM  2. Recurrent fever/septic shock, pneumonia Likely due to dysfunctional  leukocytes as above  -CT chest on 2/23: Persistent consolidation RML with new peribronchovascular opacities suggestive of bronchopneumonia. Underwent bronch on 2/26; BCx NGTD,  Diff PCR neg, BAL: PCP neg, Bacterial, fungal, and AFB cultures NGTD  - 3/6: febrile; CT: Persistent right upper lobe collapse/ consolidation with interval development of central airspace  disease in the left upper lobe  - 3/6: restarted IV atx, antifungals, acyclovir  -off pressors; clinically improving, started taper IV steroids 3/10  -3/11: PICC line is out. Will stop all antibiotics and antifungals as all cx data has been negative to date. Hopefully fevers and "sepsis" will not recur. -3/17: no further signs of sepsis since discontinuation of antibiotics.  3. Acute on chronic diastolic heart failure due to IVF from resuscitation. Echo (2/15): Preserved EF and grade 1 diastolic heart failure -R sided pleural effusion; anasarca, cont IV lasix; Daily weights and strict I/O; monitor renal function -per pulmonology deferred thoracentesis due to thrombocytopenia, R sided pleural effusion   -Is 13 L negative since admission.  4. 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  5. Acute Renal Failure due to septic shock; RUS wnl. Hypervolemic  - resumed lasix due to respiratory status; close monitor   6. Spontaneous retroperitoneal bleed in setting of thrombocytopenia; hemorrhage into her left iliopsoas muscle which caused weakness and pain of her left leg and LLQ - on fentanyl; cont pain control  -Resume PT/OT.  7. Right upper extremity superficial thrombosis, stable   8. Transaminitis due to septic shock, resolved.   9. Urge and stress incontinence; Foley placed for comfort   10. Tongue ulcer; cont as above; biopsied by ENT; path: SQUAMOUS EPITHELIUM. NO DYSPLASIA OR MALIGNANCY -improving   11. Hypokalemia -Repleted.  12. Progressive Rash - Surgery consulted for skin biopsy - await results  13. Nausea/vomiting - Will obtain plain film xray - Stool documented on 3/16 and again on 3/20 - Pt has been mainly sedentary - Urine cx pending  - Consider d/c foley cath  Proph: Hold for thrombocytopenia, spont retroperitoneal bleed   Code Status: full  Family Communication: patient alone,  Disposition Plan: To SNF once  insurance approves and ok with Dr. Marin Olp.  Consultants:  Dr. Burney Gauze (oncology)  Dr Gibson Ramp (Surgery) curbside consult  Dr. Carlyle Basques (infectious disease)    Procedures:  11/12/2013 right upper extremity Doppler  No evidence of deep vein thrombosis involving the right upper extremity, left subclavian vein, and left internal jugular vein. There is superficial thrombosis noted in the right cephalic vein.  Old PICC removed 3/5; Left-sided PICC 03/06 >>>  Foley 3/2 >>>   -2/12 Bone marrow biopsy report pending  Echocardiogram 01/10/2013  - Left ventricle: mild focal basal hypertrophy of the septum.  -LVEF= ejection fraction was 55%. Wall motion was normal; there were no regional wall motion abnormalities.  -(grade 1 diastolic dysfunction).  - Aortic valve: There was no stenosis. Trivial regurgitation. - Mitral valve: Trivial regurgitation. - Left atrium: The atrium was mildly dilated. - Right ventricle: The cavity size was normal. Systolic function was normal. - Tricuspid valve: Peak RV-RA gradient: 31mm Hg (S). - Pulmonary arteries: PA peak pressure: 56mm Hg (S). - Inferior vena cava: The vessel was normal in size; the respirophasic diameter changes were in the normal range (=50%); findings are consistent with normal central venous pressure.  Antibiotics:  None at present  2/15 Upper extremity Dopplers demonstrated acute clot of the right cephalic vein  5/59 Chest CT demonstrated loculated right pleural effusion which does not appear to have intact  2/17 Echocardiogram demonstrates EF of 74-16% with diastolic dysfunction.  2/24 Abdominal CT demonstrated hemorrhage in the left iliacus muscle  2/23 Chest CT demonstrated persistent right pleural effusion and right middle lobe density  2/25 Head CT demonstrated sphenoid sinusitis  2/26 Bronchoscopy did not reveal an infectious organism  3/3 Lower extremity Dopplers did not detect DVTs  3/6 Chest CT> persistent  collapse of the RML, RLL with a large pleural effusion. Development of a new left airspace opacity.  3/8 TTE >>> Ef 65%, no RWMA, PAP 35 torr  3/8 Abdomen CT >>> No findings consistent with sepsis, evolution of L iliac hematoma  3/8 [place] R Effusion>>>small in volume, loculated & complex appearing by [place], [place]   Consultants:  Oncology  HPI/Subjective: No acute events noted overnight  Objective: Filed Vitals:   12/15/13 0442  BP: 112/69  Pulse: 129  Temp: 98 F (36.7 C)  Resp: 18    Intake/Output Summary (Last 24 hours) at 12/15/13 1202 Last data filed at 12/15/13 1027  Gross per 24 hour  Intake    120 ml  Output    725 ml  Net   -605 ml   Filed Weights   12/12/13 0243 12/13/13 0538 12/15/13 0442  Weight: 77.4 kg (170 lb 10.2 oz) 77.4 kg (170 lb 10.2 oz) 76.2 kg (167 lb 15.9 oz)    Exam:   General:  alert  Cardiovascular: s1,s2 rrr  Respiratory: few LL crackles   Abdomen: soft, nt, nd   Musculoskeletal: LE edema   Data Reviewed: Basic Metabolic Panel:  Recent Labs Lab 12/11/13 0448 12/12/13 0500 12/13/13 0630 12/14/13 0410 12/15/13 0405  NA 135* 133* 133* 137 138  K 4.2 3.7 3.7 3.6* 3.5*  CL 100 98 98 99 101  CO2 $Re'24 23 22 25 23  'Nrn$ GLUCOSE 122* 114* 109* 119* 123*  BUN $Re'12 11 10 15 17  'JXP$ CREATININE 1.03 1.00 1.00 1.27* 1.21*  CALCIUM 8.7 8.9 8.7 8.9 9.2   Liver Function Tests:  Recent Labs Lab 12/11/13 0448 12/12/13 0500 12/13/13 0630 12/14/13 0410 12/15/13 0405  AST  $'11 12 12 14 13  'D$ ALT '9 11 13 16 13  '$ ALKPHOS 72 79 88 102 102  BILITOT 0.8 0.5 0.5 0.8 0.7  PROT 6.8 7.3 7.1 7.4 7.5  ALBUMIN 2.4* 2.4* 2.4* 2.6* 2.6*    Recent Labs Lab 12/15/13 0405  LIPASE 57  AMYLASE 86   No results found for this basename: AMMONIA,  in the last 168 hours CBC:  Recent Labs Lab 12/11/13 0448 12/12/13 0500 12/13/13 0630 12/13/13 2245 12/14/13 0410 12/15/13 0405  WBC 7.5 6.8 6.1  --  8.6 8.8  NEUTROABS 5.0 4.1 3.4  --  5.4 5.9  HGB 7.6* 7.1*  6.8* 9.9* 8.0* 10.3*  HCT 23.6* 21.5* 20.7* 29.7* 23.6* 30.3*  MCV 88.4 86.7 87.0  --  84.6 83.7  PLT 12* 14* 18*  --  21* 25*   Cardiac Enzymes: No results found for this basename: CKTOTAL, CKMB, CKMBINDEX, TROPONINI,  in the last 168 hours BNP (last 3 results)  Recent Labs  11/14/13 0435 11/17/13 0800 11/20/13 0515  PROBNP 2977.0* 9541.0* 3178.0*   CBG: No results found for this basename: GLUCAP,  in the last 168 hours  Recent Results (from the past 240 hour(s))  CLOSTRIDIUM DIFFICILE BY PCR     Status: None   Collection Time    12/06/13 10:51 AM      Result Value Ref Range Status   C difficile by pcr NEGATIVE  NEGATIVE Final   Comment: Performed at [facility]     Studies: Dg Chest 2 View  12/14/2013   CLINICAL DATA:  Lymphoma, cough  EXAM: CHEST  2 VIEW  COMPARISON:  CT ABD/PELV WO CM dated 12/11/2013; DG CHEST 1V PORT dated 12/05/2013  FINDINGS: Stable enlarged cardiac silhouette. Small right effusion is again noted. There is bibasilar mild atelectasis. No focal consolidation. No pneumothorax.  IMPRESSION: Cardiomegaly and right effusion similar to comparison CT.   Electronically Signed   By: Suzy Bouchard M.D.   On: 12/14/2013 09:10    Scheduled Meds: . DULoxetine  60 mg Oral QPM  . fluticasone  2 spray Each Nare Daily  . hydrocortisone  10 mg Oral BID  . pantoprazole  40 mg Oral QHS  . sodium chloride  10-40 mL Intracatheter Q12H  . sodium chloride  3 mL Intravenous Q12H   Continuous Infusions: . sodium chloride 50 mL/hr at 12/15/13 1014    Active Problems:   Myelodysplasia   Symptomatic anemia   Adrenal infarction   Acute renal failure   Acute on chronic diastolic heart failure   Altered mental status   Acute-on-chronic respiratory failure   Hypotension, unspecified   Severe sepsis   DVT (deep venous thrombosis)   Pleural effusion   Thrombocytopenia   Hypokalemia  Time spent: 25 minutes   [facility], [facility] Pager  [phone]. If 7PM-7AM, please contact night-coverage at [email or web address], password [facility] 12/15/2013, 12:02 PM  LOS: 38 days

## 2013-12-15 NOTE — Progress Notes (Signed)
12/15/13 1500  Clinical Encounter Type  Visited With Family;Patient not available (close friend and POA [name])  Visit Type Follow-up;Spiritual support;Social support  Referral From [place] Grief support;Emotional   [place] is following pt and family for bereavement support, as well as general emotional support through Debbie's illness.  While pt was resting, visited with friend/POA [name] to provide further care to her as a caregiver and bereaved friend, as well as to offer logistical support (such as help with holding a memorial opportunity here at the hospital, if that would be helpful for Debbie's own healing in the coming days) if desired.  Please page as needs arise:  [phone].  Thank you.  [place], [place]

## 2013-12-15 NOTE — Progress Notes (Signed)
Agree with note. 

## 2013-12-15 NOTE — Progress Notes (Signed)
Started having vomiting last night. Some upper abdominal pain. Will check a lipase and amylase on her. Will also get a urinalysis and urine culture.  She had a biopsy of the arm rash. This might be back today. I think Dr. Zella Richer for doing this so quickly.  She still is struggling with her husband's tragic death.  She's hada decrease in cough.her chest x-ray showed no infiltrate. She had a little effusion. Some mild by basilar atelectasis noted.  She's had no diarrhea.  Her vital signs are okay. She is tachycardic with a rate of 129. Blood pressure is 112/69. Oral exam shows her to have ulcer to be a little better. Her oral mucosa slightly dry. I will give her a little more IV fluid given his vomiting. Lungs are clear. Cardiac exam tachycardic but regular. Abdomen is soft. No distention. Some slight tenderness in the epigastric area. Bowel sounds are active. No palpable hepato- megaly. Extremities shows no clubbing cyanosis or edema. Skin exam shows the rash on the arms that appears stable. Neurological exam is nonfocal.  Labs show a potassium of 2.5. Creatinine is 1.21. Liver function tests are okay. White cell count 8.8. Hemoglobin 10.3. Platelet count 25.  Am not sure why the emesis. I suppose it could still be from the chemotherapy that she had. Her last dose was Monday.  We will see what the biopsy shows.  She is on Zofran and Phenergan. Hopefully this will help with the emesis. We will see what the lipase and amylase show. Need to make sure that she does not have pancreatitis. We'll also check a urine culture.  I very much thank  The staff on 4 E. And the hospitalist for the great care that she is getting.  [place] 46:1

## 2013-12-15 NOTE — Progress Notes (Signed)
PT Cancellation Note  Patient Details Name: Lacey [name] MRN: [id number] DOB: December 08, 1955   Cancelled Treatment:    Reason Eval/Treat Not Completed: Other Will hold PT and check back on Monday for PT tx session if pt feels ready to participate. Thanks.    Weston Anna, MPT Pager: [phone]

## 2013-12-15 NOTE — Progress Notes (Signed)
Pt had several events of nausea during the night. NP extender on called and Phenergan 12.5 mg given x2 for nausea and active vomiting. Zofran given as scheduled, SRP, RN

## 2013-12-16 DIAGNOSIS — D6481 Anemia due to antineoplastic chemotherapy: Secondary | ICD-10-CM

## 2013-12-16 DIAGNOSIS — T451X5A Adverse effect of antineoplastic and immunosuppressive drugs, initial encounter: Secondary | ICD-10-CM

## 2013-12-16 LAB — CBC WITH DIFFERENTIAL/PLATELET
BASOS ABS: 0.1 10*3/uL (ref 0.0–0.1)
BASOS PCT: 1 % (ref 0–1)
Eosinophils Absolute: 0.4 10*3/uL (ref 0.0–0.7)
Eosinophils Relative: 5 % (ref 0–5)
HEMATOCRIT: 28.3 % — AB (ref 36.0–46.0)
HEMOGLOBIN: 9.2 g/dL — AB (ref 12.0–15.0)
LYMPHS ABS: 2 10*3/uL (ref 0.7–4.0)
LYMPHS PCT: 23 % (ref 12–46)
MCH: 28 pg (ref 26.0–34.0)
MCHC: 32.5 g/dL (ref 30.0–36.0)
MCV: 86 fL (ref 78.0–100.0)
Monocytes Absolute: 0.6 10*3/uL (ref 0.1–1.0)
Monocytes Relative: 7 % (ref 3–12)
Neutro Abs: 5.6 10*3/uL (ref 1.7–7.7)
Neutrophils Relative %: 64 % (ref 43–77)
Platelets: 22 10*3/uL — CL (ref 150–400)
RBC: 3.29 MIL/uL — ABNORMAL LOW (ref 3.87–5.11)
RDW: 17.3 % — AB (ref 11.5–15.5)
WBC: 8.7 10*3/uL (ref 4.0–10.5)

## 2013-12-16 LAB — COMPREHENSIVE METABOLIC PANEL
ALT: 11 U/L (ref 0–35)
AST: 11 U/L (ref 0–37)
Albumin: 2.4 g/dL — ABNORMAL LOW (ref 3.5–5.2)
Alkaline Phosphatase: 101 U/L (ref 39–117)
BUN: 15 mg/dL (ref 6–23)
CALCIUM: 8.8 mg/dL (ref 8.4–10.5)
CO2: 24 mEq/L (ref 19–32)
Chloride: 100 mEq/L (ref 96–112)
Creatinine, Ser: 1.15 mg/dL — ABNORMAL HIGH (ref 0.50–1.10)
GFR calc Af Amer: 59 mL/min — ABNORMAL LOW (ref >90)
GFR calc non Af Amer: 51 mL/min — ABNORMAL LOW (ref >90)
Glucose, Bld: 117 mg/dL — ABNORMAL HIGH (ref 70–99)
POTASSIUM: 3.5 meq/L — AB (ref 3.7–5.3)
SODIUM: 137 meq/L (ref 137–147)
TOTAL PROTEIN: 7.1 g/dL (ref 6.0–8.3)
Total Bilirubin: 0.6 mg/dL (ref 0.3–1.2)

## 2013-12-16 LAB — RETICULOCYTES
RBC.: 3.29 MIL/uL — ABNORMAL LOW (ref 3.87–5.11)
Retic Ct Pct: <0.4 % — ABNORMAL LOW (ref 0.4–3.1)

## 2013-12-16 MED ORDER — TRIAMCINOLONE ACETONIDE 0.5 % EX CREA
TOPICAL_CREAM | Freq: Three times a day (TID) | CUTANEOUS | Status: DC
  Administered 2013-12-16: 1 via TOPICAL
  Administered 2013-12-16 – 2013-12-18 (×7): via TOPICAL
  Administered 2013-12-19: 1 via TOPICAL
  Administered 2013-12-19 – 2013-12-22 (×7): via TOPICAL
  Administered 2013-12-22: 1 via TOPICAL
  Administered 2013-12-23: 10:00:00 via TOPICAL
  Filled 2013-12-16 (×4): qty 15

## 2013-12-16 MED ORDER — GUAIFENESIN 100 MG/5ML PO SOLN
200.0000 mg | ORAL | Status: DC | PRN
  Administered 2013-12-16 – 2013-12-19 (×3): 200 mg via ORAL
  Filled 2013-12-16 (×2): qty 10

## 2013-12-16 NOTE — Progress Notes (Signed)
Lacey [name]   DOB:05-May-1956   CW#:[id number]   VVO#:[id number]  Subjective: patient arousable and is without any complaints. No nausea or vomiting, no fevers or chills.vitals are stable.   Objective:  Filed Vitals:   12/16/13 1409  BP: 109/54  Pulse: 121  Temp: 99.8 F (37.7 C)  Resp: 18    Body mass index is 32.94 kg/(m^2).  Intake/Output Summary (Last 24 hours) at 12/16/13 1528 Last data filed at 12/16/13 1410  Gross per 24 hour  Intake 1024.17 ml  Output    800 ml  Net 224.17 ml     Sclerae unicteric  Oropharynx clear  No peripheral adenopathy  Lungs clear -- no rales or rhonchi  Heart regular rate and rhythm  Abdomen benign  MSK no focal spinal tenderness, no peripheral edema  Neuro nonfocal   CBG (last 3)  No results found for this basename: GLUCAP,  in the last 72 hours   Labs:  Lab Results  Component Value Date   WBC 8.7 12/16/2013   HGB 9.2* 12/16/2013   HCT 28.3* 12/16/2013   MCV 86.0 12/16/2013   PLT 22* 12/16/2013   NEUTROABS 5.6 12/16/2013    Urine Studies No results found for this basename: UACOL, UAPR, USPG, UPH, UTP, UGL, UKET, UBIL, UHGB, UNIT, UROB, ULEU, UEPI, UWBC, URBC, UBAC, CAST, CRYS, UCOM, BILUA,  in the last 72 hours  Basic Metabolic Panel:  Recent Labs Lab 12/12/13 0500 12/13/13 0630 12/14/13 0410 12/15/13 0405 12/16/13 0446  NA 133* 133* 137 138 137  K 3.7 3.7 3.6* 3.5* 3.5*  CL 98 98 99 101 100  CO2 23 22 25 23 24   GLUCOSE 114* 109* 119* 123* 117*  BUN 11 10 15 17 15   CREATININE 1.00 1.00 1.27* 1.21* 1.15*  CALCIUM 8.9 8.7 8.9 9.2 8.8   GFR Estimated Creatinine Clearance: 48.1 ml/min (by C-G formula based on Cr of 1.15). Liver Function Tests:  Recent Labs Lab 12/12/13 0500 12/13/13 0630 12/14/13 0410 12/15/13 0405 12/16/13 0446  AST 12 12 14 13 11   ALT 11 13 16 13 11   ALKPHOS 79 88 102 102 101  BILITOT 0.5 0.5 0.8 0.7 0.6  PROT 7.3 7.1 7.4 7.5 7.1  ALBUMIN 2.4* 2.4* 2.6* 2.6* 2.4*    Recent Labs Lab  12/15/13 0405  LIPASE 57  AMYLASE 86   No results found for this basename: AMMONIA,  in the last 168 hours Coagulation profile No results found for this basename: INR, PROTIME,  in the last 168 hours  CBC:  Recent Labs Lab 12/12/13 0500 12/13/13 0630 12/13/13 2245 12/14/13 0410 12/15/13 0405 12/16/13 0446  WBC 6.8 6.1  --  8.6 8.8 8.7  NEUTROABS 4.1 3.4  --  5.4 5.9 5.6  HGB 7.1* 6.8* 9.9* 8.0* 10.3* 9.2*  HCT 21.5* 20.7* 29.7* 23.6* 30.3* 28.3*  MCV 86.7 87.0  --  84.6 83.7 86.0  PLT 14* 18*  --  21* 25* 22*   Cardiac Enzymes: No results found for this basename: CKTOTAL, CKMB, CKMBINDEX, TROPONINI,  in the last 168 hours BNP: No components found with this basename: POCBNP,  CBG: No results found for this basename: GLUCAP,  in the last 168 hours D-Dimer No results found for this basename: DDIMER,  in the last 72 hours Hgb A1c No results found for this basename: HGBA1C,  in the last 72 hours Lipid Profile No results found for this basename: CHOL, HDL, LDLCALC, TRIG, CHOLHDL, LDLDIRECT,  in the last 72 hours Thyroid  function studies No results found for this basename: TSH, T4TOTAL, FREET3, T3FREE, THYROIDAB,  in the last 72 hours Anemia work up  Recent Labs  12/15/13 0405 12/16/13 0446  RETICCTPCT <0.4* <0.4*   Microbiology Recent Results (from the past 240 hour(s))  URINE CULTURE     Status: None   Collection Time    12/15/13 10:30 AM      Result Value Ref Range Status   Specimen Description URINE, CATHETERIZED   Final   Special Requests Immunocompromised   Final   Culture  Setup Time     Final   Value: 12/15/2013 13:04     Performed at [facility] PENDING   Incomplete   Culture     Final   Value: Culture reincubated for better growth     Performed at [facility]   Report Status PENDING   Incomplete      Studies:  Dg Abd Portable 1v  12/15/2013   CLINICAL DATA:  Abdominal pain with nausea and vomiting and  constipation.  EXAM: PORTABLE ABDOMEN - 1 VIEW  COMPARISON:  CT ABD/PELV WO CM dated 12/11/2013  FINDINGS: The bowel gas pattern is within the limits of normal. There is stool in the rectum. No free extraluminal gas collections are demonstrated. No abnormal small bowel distention is demonstrated. The observed portions of the lumbar spine and bony pelvis exhibit no acute abnormalities.  IMPRESSION: There is no acute intra-abdominal abnormality demonstrated. There is stool in the rectal vault.   Electronically Signed   By: David  Martinique   On: 12/15/2013 13:42    Assessment: 58 y.o.with  1. Hodgkins lymphoma and myelodysplastic syndrome: treated with vidaza.  2. Anemia: secondary to chemotherapy and underlying disease  3. Rash: s/p biopsy await results.  4. Disposition:  Discharge when she is stable  [name] 12/16/2013

## 2013-12-16 NOTE — Progress Notes (Signed)
Per MD, not likely that Pt will be ready over the weekend.  Weekday CSW to follow.  Bernita Raisin, [place] [facility] [phone]

## 2013-12-16 NOTE — Progress Notes (Addendum)
At the request of the Social Worker who had to leave to attend to other matters, chaplain assisted in completion of advanced directives. As Chaplain was not a notary the [facility] was asked to notarize the documents prepared by a family member who is an Forensic psychologist. This family member was present at the signing. Unit nurses gathered and escorted unrelated non staff witnesses.  Chaplain before witnesses and family asked and had answered the question: "Is there anything you do not understand?" Answered "no, I understand everything."  Question: "Are you signing these documents under any threat or pressure from another person?" Answer "No, I am signing freely under no pressure from anyone."  No further follow up of this matter indicated.  Sallee Lange. [place], [place]

## 2013-12-16 NOTE — Progress Notes (Signed)
[facility] PROGRESS NOTE  Lacey [name]:[id number] DOB: 07-03-1956 DOA: 11/11/2013 PCP: Vena Austria, MD  Brief summary  58 y.o. female with history of Hodgkin's lymphoma status post chemotherapy now with myelodysplastic syndrome who initially presented with abdominal pain, nausea, vomiting, hypotension and complicated with adrenal infarct, adrenal insufficiency, HCAP, recurrent sepsis, hypotension RNF<-->ICU/pressure support+IV atx;  -She also developed hemorrhage into her left iliopsoas muscle which caused weakness and pain of her left leg and LLQ. -Dr. Marin Olp started her on [place] for her MDS. -Now waiting for insurance auth to go to SNF. -Also, may need another platelet/PRBC transfusion prior to DC.  Assessment/Plan  1. Myelodysplastic syndrome in setting of Hodgkin Lymphoma:  -Refractory anemia and thrombocytopenia requiring almost daily blood product transfusions, Although for the past 6 days counts appear to be somewhat stable.  Bone Marrow Biopsy: HYPERCELLULAR BONE MARROW FOR AGE WITH DYSPOIETIC CHANGES. -myelodysplastic/myeloproliferative process, particularly chronic myelomonocytic leukemia - Appreciate Dr Jonette Eva assistance. on frequent daily transfusions (PRBC+platelets) as patient is developing antibodies. Per hem/onc, started Vidaza on 3/11, since stopped -3/17: Plt down to 14. Watch closely for need for transfusion (if <10 or active bleeding). -3/17: Hb down to 7.1. -3/18 - Hb to 6.8 - 2 units PRBC's transfused - Hgb has since remained stable  2. Recurrent fever/septic shock, pneumonia Likely due to dysfunctional  leukocytes as above  -CT chest on 2/23: Persistent consolidation RML with new peribronchovascular opacities suggestive of bronchopneumonia. Underwent bronch on 2/26; BCx NGTD,  Diff PCR neg, BAL: PCP neg, Bacterial, fungal, and AFB cultures NGTD  - 3/6: febrile; CT: Persistent right upper lobe collapse/ consolidation with interval development  of central airspace disease in the left upper lobe  - 3/6: restarted IV atx, antifungals, acyclovir  -off pressors; clinically improving, started taper IV steroids 3/10  -3/11: PICC line is out. Will stop all antibiotics and antifungals as all cx data has been negative to date. Hopefully fevers and "sepsis" will not recur. -3/17: no further signs of sepsis since discontinuation of antibiotics.  3. Acute on chronic diastolic heart failure due to IVF from resuscitation. Echo (2/15): Preserved EF and grade 1 diastolic heart failure -R sided pleural effusion; anasarca, cont IV lasix; Daily weights and strict I/O; monitor renal function -per pulmonology deferred thoracentesis due to thrombocytopenia, R sided pleural effusion   -Is 17 L negative since admission.  4. 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  5. Acute Renal Failure due to septic shock; RUS wnl. Hypervolemic  - resumed lasix due to respiratory status; close monitor   6. Spontaneous retroperitoneal bleed in setting of thrombocytopenia; hemorrhage into her left iliopsoas muscle which caused weakness and pain of her left leg and LLQ - on fentanyl; cont pain control  -Resume PT/OT.  7. Right upper extremity superficial thrombosis, stable   8. Transaminitis due to septic shock, resolved.   9. Urge and stress incontinence; Foley placed for comfort   10. Tongue ulcer; cont as above; biopsied by ENT; path: SQUAMOUS EPITHELIUM. NO DYSPLASIA OR MALIGNANCY -improving   11. Hypokalemia -Repleted.  12. Progressive Rash - Surgery consulted for skin biopsy - await results  13. Nausea/vomiting - Plain film xray without obstruction - Stool output documented on 3/16 and again on 3/20 - Urine cx pending   Proph: Hold for thrombocytopenia, spont retroperitoneal bleed   Code Status: full  Family Communication: patient alone,  Disposition Plan: SNF  Consultants:  Dr. Burney Gauze (oncology)  Dr  Gibson Ramp (Surgery) curbside consult  Dr. Carlyle Basques (infectious disease)    Procedures:  11/12/2013 right upper extremity Doppler  No evidence of deep vein thrombosis involving the right upper extremity, left subclavian vein, and left internal jugular vein. There is superficial thrombosis noted in the right cephalic vein.  Old PICC removed 3/5; Left-sided PICC 03/06 >>>  Foley 3/2 >>>   -2/12 Bone marrow biopsy report pending  Echocardiogram 01/10/2013  - Left ventricle: mild focal basal hypertrophy of the septum.  -LVEF= ejection fraction was 55%. Wall motion was normal; there were no regional wall motion abnormalities.  -(grade 1 diastolic dysfunction).  - Aortic valve: There was no stenosis. Trivial regurgitation. - Mitral valve: Trivial regurgitation. - Left atrium: The atrium was mildly dilated. - Right ventricle: The cavity size was normal. Systolic function was normal. - Tricuspid valve: Peak RV-RA gradient: 4m Hg (S). - Pulmonary arteries: PA peak pressure: 261mHg (S). - Inferior vena cava: The vessel was normal in size; the respirophasic diameter changes were in the normal range (=50%); findings are consistent with normal central venous pressure.  Antibiotics:  None at present  2/15 Upper extremity Dopplers demonstrated acute clot of the right cephalic vein  2/9/35hest CT demonstrated loculated right pleural effusion which does not appear to have intact  2/17 Echocardiogram demonstrates EF of 5570-17%ith diastolic dysfunction.  2/24 Abdominal CT demonstrated hemorrhage in the left iliacus muscle  2/23 Chest CT demonstrated persistent right pleural effusion and right middle lobe density  2/25 Head CT demonstrated sphenoid sinusitis  2/26 Bronchoscopy did not reveal an infectious organism  3/3 Lower extremity Dopplers did not detect DVTs  3/6 Chest CT> persistent collapse of the RML, RLL with a large pleural effusion. Development of a new  left airspace opacity.  3/8 TTE >>> Ef 65%, no RWMA, PAP 35 torr  3/8 Abdomen CT >>> No findings consistent with sepsis, evolution of L iliac hematoma  3/8 [place] Effusion>>>small in volume, loculated & complex appearing by [place] [facility]:  Oncology  HPI/Subjective: No acute events noted overnight  Objective: Filed Vitals:   12/16/13 0545  BP: 110/68  Pulse: 107  Temp: 97.7 F (36.5 C)  Resp: 18    Intake/Output Summary (Last 24 hours) at 12/16/13 1012 Last data filed at 12/16/13 0641  Gross per 24 hour  Intake 1202.5 ml  Output   1150 ml  Net   52.5 ml   Filed Weights   12/13/13 0538 12/15/13 0442 12/16/13 0545  Weight: 77.4 kg (170 lb 10.2 oz) 76.2 kg (167 lb 15.9 oz) 76.5 kg (168 lb 10.4 oz)    Exam:   General:  alert  Cardiovascular: s1,s2 rrr  Respiratory: few LL crackles   Abdomen: soft, nt, nd   Musculoskeletal: LE edema   Data Reviewed: Basic Metabolic Panel:  Recent Labs Lab 12/12/13 0500 12/13/13 0630 12/14/13 0410 12/15/13 0405 12/16/13 0446  NA 133* 133* 137 138 137  K 3.7 3.7 3.6* 3.5* 3.5*  CL 98 98 99 101 100  CO2 _0 GLUCOSE 114* 109* 119* 123* 117*  BUN _1 CREATININE 1.00 1.00 1.27* 1.21* 1.15*  CALCIUM 8.9 8.7 8.9 9.2 8.8   Liver Function Tests:  Recent Labs Lab 12/12/13 0500 12/13/13 0630 12/14/13 0410 12/15/13 0405 12/16/13 0446  AST _2 ALT _3 ALKPHOS 79 88 102 102 101  BILITOT 0.5 0.5 0.8 0.7 0.6  PROT 7.3 7.1 7.4 7.5 7.1  ALBUMIN 2.4* 2.4* 2.6* 2.6* 2.4*    Recent Labs Lab 12/15/13 0405  LIPASE 57  AMYLASE 86   No results found for this basename: AMMONIA,  in the last 168 hours CBC:  Recent Labs Lab 12/12/13 0500 12/13/13 0630 12/13/13 2245 12/14/13 0410 12/15/13 0405 12/16/13 0446  WBC 6.8 6.1  --  8.6 8.8 8.7  NEUTROABS 4.1 3.4  --  5.4 5.9 5.6  HGB 7.1* 6.8* 9.9* 8.0* 10.3* 9.2*  HCT 21.5* 20.7* 29.7* 23.6* 30.3* 28.3*  MCV 86.7  87.0  --  84.6 83.7 86.0  PLT 14* 18*  --  21* 25* 22*   Cardiac Enzymes: No results found for this basename: CKTOTAL, CKMB, CKMBINDEX, TROPONINI,  in the last 168 hours BNP (last 3 results)  Recent Labs  11/14/13 0435 11/17/13 0800 11/20/13 0515  PROBNP 2977.0* 9541.0* 3178.0*   CBG: No results found for this basename: GLUCAP,  in the last 168 hours  Recent Results (from the past 240 hour(s))  CLOSTRIDIUM DIFFICILE BY PCR     Status: None   Collection Time    12/06/13 10:51 AM      Result Value Ref Range Status   C difficile by pcr NEGATIVE  NEGATIVE Final   Comment: Performed at [facility]     Studies: Dg Abd Portable 1v  12/15/2013   CLINICAL DATA:  Abdominal pain with nausea and vomiting and constipation.  EXAM: PORTABLE ABDOMEN - 1 VIEW  COMPARISON:  CT ABD/PELV WO CM dated 12/11/2013  FINDINGS: The bowel gas pattern is within the limits of normal. There is stool in the rectum. No free extraluminal gas collections are demonstrated. No abnormal small bowel distention is demonstrated. The observed portions of the lumbar spine and bony pelvis exhibit no acute abnormalities.  IMPRESSION: There is no acute intra-abdominal abnormality demonstrated. There is stool in the rectal vault.   Electronically Signed   By: David  Martinique   On: 12/15/2013 13:42    Scheduled Meds: . DULoxetine  60 mg Oral QPM  . fluticasone  2 spray Each Nare Daily  . hydrocortisone  10 mg Oral BID  . pantoprazole  40 mg Oral QHS  . sodium chloride  10-40 mL Intracatheter Q12H  . sodium chloride  3 mL Intravenous Q12H   Continuous Infusions: . sodium chloride 50 mL/hr at 12/15/13 2006    Active Problems:   Myelodysplasia   Symptomatic anemia   Adrenal infarction   Acute renal failure   Acute on chronic diastolic heart failure   Altered mental status   Acute-on-chronic respiratory failure   Hypotension, unspecified   Severe sepsis   DVT (deep venous thrombosis)   Pleural effusion    Thrombocytopenia   Hypokalemia  Time spent: 25 minutes   Javen Hinderliter, [place] [facility] Pager [phone]. If 7PM-7AM, please contact night-coverage at [email or web address], password [facility] 12/16/2013, 10:12 AM  LOS: 39 days

## 2013-12-17 DIAGNOSIS — IMO0001 Reserved for inherently not codable concepts without codable children: Secondary | ICD-10-CM

## 2013-12-17 LAB — COMPREHENSIVE METABOLIC PANEL
ALK PHOS: 113 U/L (ref 39–117)
ALT: 14 U/L (ref 0–35)
AST: 16 U/L (ref 0–37)
Albumin: 2.3 g/dL — ABNORMAL LOW (ref 3.5–5.2)
BILIRUBIN TOTAL: 0.7 mg/dL (ref 0.3–1.2)
BUN: 9 mg/dL (ref 6–23)
CHLORIDE: 100 meq/L (ref 96–112)
CO2: 23 mEq/L (ref 19–32)
Calcium: 8.6 mg/dL (ref 8.4–10.5)
Creatinine, Ser: 0.95 mg/dL (ref 0.50–1.10)
GFR calc Af Amer: 75 mL/min — ABNORMAL LOW (ref >90)
GFR calc non Af Amer: 64 mL/min — ABNORMAL LOW (ref >90)
Glucose, Bld: 114 mg/dL — ABNORMAL HIGH (ref 70–99)
POTASSIUM: 3.3 meq/L — AB (ref 3.7–5.3)
Sodium: 135 mEq/L — ABNORMAL LOW (ref 137–147)
Total Protein: 6.4 g/dL (ref 6.0–8.3)

## 2013-12-17 LAB — CBC WITH DIFFERENTIAL/PLATELET
BASOS ABS: 0.1 10*3/uL (ref 0.0–0.1)
Basophils Relative: 1 % (ref 0–1)
Eosinophils Absolute: 0.6 10*3/uL (ref 0.0–0.7)
Eosinophils Relative: 7 % — ABNORMAL HIGH (ref 0–5)
HEMATOCRIT: 24.9 % — AB (ref 36.0–46.0)
Hemoglobin: 8.4 g/dL — ABNORMAL LOW (ref 12.0–15.0)
LYMPHS ABS: 1.7 10*3/uL (ref 0.7–4.0)
Lymphocytes Relative: 21 % (ref 12–46)
MCH: 28.8 pg (ref 26.0–34.0)
MCHC: 33.7 g/dL (ref 30.0–36.0)
MCV: 85.3 fL (ref 78.0–100.0)
MONO ABS: 0.6 10*3/uL (ref 0.1–1.0)
MONOS PCT: 7 % (ref 3–12)
NEUTROS ABS: 5.2 10*3/uL (ref 1.7–7.7)
Neutrophils Relative %: 64 % (ref 43–77)
Platelets: 15 10*3/uL — CL (ref 150–400)
RBC: 2.92 MIL/uL — ABNORMAL LOW (ref 3.87–5.11)
RDW: 16.9 % — AB (ref 11.5–15.5)
WBC: 8.2 10*3/uL (ref 4.0–10.5)

## 2013-12-17 LAB — RETICULOCYTES
RBC.: 2.92 MIL/uL — AB (ref 3.87–5.11)
Retic Ct Pct: <0.4 % — ABNORMAL LOW (ref 0.4–3.1)

## 2013-12-17 NOTE — Progress Notes (Signed)
[facility] PROGRESS NOTE  JOELIE SCHOU VVO:[id number] DOB: [birth year] DOA: 11/11/2013 PCP: Vena Austria, MD  Brief summary  58 y.o. female with history of Hodgkin's lymphoma status post chemotherapy now with myelodysplastic syndrome who initially presented with abdominal pain, nausea, vomiting, hypotension and complicated with adrenal infarct, adrenal insufficiency, HCAP, recurrent sepsis, hypotension RNF<-->ICU/pressure support+IV atx;  -She also developed hemorrhage into her left iliopsoas muscle which caused weakness and pain of her left leg and LLQ. -Dr. Marin Olp started her on [place] for her MDS. -Now waiting for insurance auth to go to SNF. -Also, may need another platelet/PRBC transfusion prior to DC.  Assessment/Plan  1. Myelodysplastic syndrome in setting of Hodgkin Lymphoma:  -Refractory anemia and thrombocytopenia requiring almost daily blood product transfusions, Although for the past 6 days counts appear to be somewhat stable.  Bone Marrow Biopsy: HYPERCELLULAR BONE MARROW FOR AGE WITH DYSPOIETIC CHANGES. -myelodysplastic/myeloproliferative process, particularly chronic myelomonocytic leukemia - Appreciate Dr Jonette Eva assistance. on frequent daily transfusions (PRBC+platelets) as patient is developing antibodies. Per hem/onc, started Vidaza on 3/11, since stopped -3/17: Plt down to 14. Watch closely for need for transfusion (if <10 or active bleeding). -3/17: Hb down to 7.1. -3/18 - Hb to 6.8 - 2 units PRBC's transfused - Hgb trending down, currently 8.4  2. Recurrent fever/septic shock, pneumonia Likely due to dysfunctional  leukocytes as above  -CT chest on 2/23: Persistent consolidation RML with new peribronchovascular opacities suggestive of bronchopneumonia. Underwent bronch on 2/26; BCx NGTD,  Diff PCR neg, BAL: PCP neg, Bacterial, fungal, and AFB cultures NGTD  - 3/6: febrile; CT: Persistent right upper lobe collapse/ consolidation with interval  development of central airspace disease in the left upper lobe  - 3/6: restarted IV atx, antifungals, acyclovir  -off pressors; clinically improving, started taper IV steroids 3/10  -3/11: PICC line is out. Will stop all antibiotics and antifungals as all cx data has been negative to date. Hopefully fevers and "sepsis" will not recur. -3/17: no further signs of sepsis since discontinuation of antibiotics.  3. Acute on chronic diastolic heart failure due to IVF from resuscitation. Echo (2/15): Preserved EF and grade 1 diastolic heart failure -R sided pleural effusion; anasarca, cont IV lasix; Daily weights and strict I/O; monitor renal function -per pulmonology deferred thoracentesis due to thrombocytopenia, R sided pleural effusion   -Is 17 L negative since admission.  4. 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  5. Acute Renal Failure due to septic shock; RUS wnl. Hypervolemic  - resumed lasix due to respiratory status; close monitor   6. Spontaneous retroperitoneal bleed in setting of thrombocytopenia; hemorrhage into her left iliopsoas muscle which caused weakness and pain of her left leg and LLQ - on fentanyl; cont pain control  -Resume PT/OT.  7. Right upper extremity superficial thrombosis, stable   8. Transaminitis due to septic shock, resolved.   9. Urge and stress incontinence; Foley placed for comfort   10. Tongue ulcer; cont as above; biopsied by ENT; path: SQUAMOUS EPITHELIUM. NO DYSPLASIA OR MALIGNANCY -improving   11. Hypokalemia -Repleted.  12. Progressive Rash - Surgery consulted for skin biopsy - await biopsy results  13. Nausea/vomiting - Plain film xray without obstruction - Stool output documented on 3/16 and again on 3/20 - Urine cx with 6,000 gm + cocci, pending speciation and sensitivities  Proph: Hold for thrombocytopenia, spont retroperitoneal bleed   Code Status: full  Family Communication:  patient alone,  Disposition Plan: SNF  Consultants:  Dr. Burney Gauze (oncology)  Dr Gibson Ramp (Surgery) curbside consult  Dr. Carlyle Basques (infectious disease)    Procedures:  11/12/2013 right upper extremity Doppler  No evidence of deep vein thrombosis involving the right upper extremity, left subclavian vein, and left internal jugular vein. There is superficial thrombosis noted in the right cephalic vein.  Old PICC removed 3/5; Left-sided PICC 03/06 >>>  Foley 3/2 >>>   -2/12 Bone marrow biopsy report pending  Echocardiogram 01/10/2013  - Left ventricle: mild focal basal hypertrophy of the septum.  -LVEF= ejection fraction was 55%. Wall motion was normal; there were no regional wall motion abnormalities.  -(grade 1 diastolic dysfunction).  - Aortic valve: There was no stenosis. Trivial regurgitation. - Mitral valve: Trivial regurgitation. - Left atrium: The atrium was mildly dilated. - Right ventricle: The cavity size was normal. Systolic function was normal. - Tricuspid valve: Peak RV-RA gradient: 72m Hg (S). - Pulmonary arteries: PA peak pressure: 233mHg (S). - Inferior vena cava: The vessel was normal in size; the respirophasic diameter changes were in the normal range (=50%); findings are consistent with normal central venous pressure.  Antibiotics:  None at present  2/15 Upper extremity Dopplers demonstrated acute clot of the right cephalic vein  2/9/45hest CT demonstrated loculated right pleural effusion which does not appear to have intact  2/17 Echocardiogram demonstrates EF of 5503-88%ith diastolic dysfunction.  2/24 Abdominal CT demonstrated hemorrhage in the left iliacus muscle  2/23 Chest CT demonstrated persistent right pleural effusion and right middle lobe density  2/25 Head CT demonstrated sphenoid sinusitis  2/26 Bronchoscopy did not reveal an infectious organism  3/3 Lower extremity Dopplers did not detect DVTs  3/6 Chest CT> persistent collapse  of the RML, RLL with a large pleural effusion. Development of a new left airspace opacity.  3/8 TTE >>> Ef 65%, no RWMA, PAP 35 torr  3/8 Abdomen CT >>> No findings consistent with sepsis, evolution of L iliac hematoma  3/8 [place] Effusion>>>small in volume, loculated & complex appearing by [place] [facility]:  Oncology  HPI/Subjective: No acute events noted overnight  Objective: Filed Vitals:   12/17/13 1420  BP: 120/59  Pulse: 89  Temp: 98.5 F (36.9 C)  Resp: 18    Intake/Output Summary (Last 24 hours) at 12/17/13 1632 Last data filed at 12/17/13 1300  Gross per 24 hour  Intake 1335.83 ml  Output   2125 ml  Net -789.17 ml   Filed Weights   12/15/13 0442 12/16/13 0545 12/17/13 0700  Weight: 76.2 kg (167 lb 15.9 oz) 76.5 kg (168 lb 10.4 oz) 76.6 kg (168 lb 14 oz)    Exam:   General:  alert  Cardiovascular: s1,s2 rrr  Respiratory: few LL crackles   Abdomen: soft, nt, nd   Musculoskeletal: LE edema   Data Reviewed: Basic Metabolic Panel:  Recent Labs Lab 12/13/13 0630 12/14/13 0410 12/15/13 0405 12/16/13 0446 12/17/13 0540  NA 133* 137 138 137 135*  K 3.7 3.6* 3.5* 3.5* 3.3*  CL 98 99 101 100 100  CO2 _0 GLUCOSE 109* 119* 123* 117* 114*  BUN _1 CREATININE 1.00 1.27* 1.21* 1.15* 0.95  CALCIUM 8.7 8.9 9.2 8.8 8.6   Liver Function Tests:  Recent Labs Lab 12/13/13 0630 12/14/13 0410 12/15/13 0405 12/16/13 0446 12/17/13 0540  AST _2 ALT _3 ALKPHOS  88 102 102 101 113  BILITOT 0.5 0.8 0.7 0.6 0.7  PROT 7.1 7.4 7.5 7.1 6.4  ALBUMIN 2.4* 2.6* 2.6* 2.4* 2.3*    Recent Labs Lab 12/15/13 0405  LIPASE 57  AMYLASE 86   No results found for this basename: AMMONIA,  in the last 168 hours CBC:  Recent Labs Lab 12/13/13 0630 12/13/13 2245 12/14/13 0410 12/15/13 0405 12/16/13 0446 12/17/13 0540  WBC 6.1  --  8.6 8.8 8.7 8.2  NEUTROABS 3.4  --  5.4 5.9 5.6 5.2  HGB 6.8* 9.9* 8.0*  10.3* 9.2* 8.4*  HCT 20.7* 29.7* 23.6* 30.3* 28.3* 24.9*  MCV 87.0  --  84.6 83.7 86.0 85.3  PLT 18*  --  21* 25* 22* 15*   Cardiac Enzymes: No results found for this basename: CKTOTAL, CKMB, CKMBINDEX, TROPONINI,  in the last 168 hours BNP (last 3 results)  Recent Labs  11/14/13 0435 11/17/13 0800 11/20/13 0515  PROBNP 2977.0* 9541.0* 3178.0*   CBG: No results found for this basename: GLUCAP,  in the last 168 hours  Recent Results (from the past 240 hour(s))  URINE CULTURE     Status: None   Collection Time    12/15/13 10:30 AM      Result Value Ref Range Status   Specimen Description URINE, CATHETERIZED   Final   Special Requests Immunocompromised   Final   Culture  Setup Time     Final   Value: 12/15/2013 13:04     Performed at [place]     Final   Value: 6,000 COLONIES/ML     Performed at [facility]   Culture     Final   Value: [place]     Performed at [facility]   Report Status PENDING   Incomplete     Studies: No results found.  Scheduled Meds: . DULoxetine  60 mg Oral QPM  . fluticasone  2 spray Each Nare Daily  . hydrocortisone  10 mg Oral BID  . pantoprazole  40 mg Oral QHS  . sodium chloride  10-40 mL Intracatheter Q12H  . sodium chloride  3 mL Intravenous Q12H  . triamcinolone cream   Topical TID   Continuous Infusions: . sodium chloride 50 mL/hr at 12/17/13 1151    Active Problems:   Myelodysplasia   Symptomatic anemia   Adrenal infarction   Acute renal failure   Acute on chronic diastolic heart failure   Altered mental status   Acute-on-chronic respiratory failure   Hypotension, unspecified   Severe sepsis   DVT (deep venous thrombosis)   Pleural effusion   Thrombocytopenia   Hypokalemia  Time spent: 25 minutes   [facility], [place] [facility] Pager [phone]. If 7PM-7AM, please contact night-coverage at [email or web address], password [facility] 12/17/2013, 4:32 PM  LOS: 40 days

## 2013-12-17 NOTE — Progress Notes (Signed)
CRITICAL VALUE ALERT  Critical value received:  Platelet Count 15  Date of notification:  12/17/13  Time of notification:  0600  Critical value read back:yes  Nurse who received alert:  Virgina Norfolk, RN  MD notified (1st page):  Rogue Bussing, NP  Time of first page:  0606  MD notified (2nd page):  Time of second page:  Responding MD:  Rogue Bussing, NP  Time MD responded:  1610  Rogue Bussing, NP responded to text page. Patient is stable. No new orders placed. Will continue to monitor.

## 2013-12-18 DIAGNOSIS — I82409 Acute embolism and thrombosis of unspecified deep veins of unspecified lower extremity: Secondary | ICD-10-CM

## 2013-12-18 LAB — COMPREHENSIVE METABOLIC PANEL
ALBUMIN: 2.2 g/dL — AB (ref 3.5–5.2)
ALK PHOS: 107 U/L (ref 39–117)
ALT: 13 U/L (ref 0–35)
AST: 13 U/L (ref 0–37)
BILIRUBIN TOTAL: 0.8 mg/dL (ref 0.3–1.2)
BUN: 6 mg/dL (ref 6–23)
CHLORIDE: 103 meq/L (ref 96–112)
CO2: 24 mEq/L (ref 19–32)
CREATININE: 0.92 mg/dL (ref 0.50–1.10)
Calcium: 8.7 mg/dL (ref 8.4–10.5)
GFR calc Af Amer: 77 mL/min — ABNORMAL LOW (ref >90)
GFR, EST NON AFRICAN AMERICAN: 67 mL/min — AB (ref >90)
Glucose, Bld: 115 mg/dL — ABNORMAL HIGH (ref 70–99)
POTASSIUM: 2.9 meq/L — AB (ref 3.7–5.3)
Sodium: 139 mEq/L (ref 137–147)
Total Protein: 6.5 g/dL (ref 6.0–8.3)

## 2013-12-18 LAB — CBC WITH DIFFERENTIAL/PLATELET
BASOS ABS: 0.1 10*3/uL (ref 0.0–0.1)
Basophils Relative: 1 % (ref 0–1)
Eosinophils Absolute: 0.3 10*3/uL (ref 0.0–0.7)
Eosinophils Relative: 3 % (ref 0–5)
HCT: 24.9 % — ABNORMAL LOW (ref 36.0–46.0)
Hemoglobin: 8.2 g/dL — ABNORMAL LOW (ref 12.0–15.0)
Lymphocytes Relative: 28 % (ref 12–46)
Lymphs Abs: 2.4 10*3/uL (ref 0.7–4.0)
MCH: 28.4 pg (ref 26.0–34.0)
MCHC: 32.9 g/dL (ref 30.0–36.0)
MCV: 86.2 fL (ref 78.0–100.0)
MONO ABS: 0.5 10*3/uL (ref 0.1–1.0)
Monocytes Relative: 6 % (ref 3–12)
NEUTROS PCT: 62 % (ref 43–77)
Neutro Abs: 5.1 10*3/uL (ref 1.7–7.7)
PLATELETS: 17 10*3/uL — AB (ref 150–400)
RBC: 2.89 MIL/uL — ABNORMAL LOW (ref 3.87–5.11)
RDW: 16.7 % — AB (ref 11.5–15.5)
WBC: 8.4 10*3/uL (ref 4.0–10.5)

## 2013-12-18 LAB — RETICULOCYTES
RBC.: 2.89 MIL/uL — ABNORMAL LOW (ref 3.87–5.11)
Retic Ct Pct: <0.4 % — ABNORMAL LOW (ref 0.4–3.1)

## 2013-12-18 LAB — MAGNESIUM: Magnesium: 1.6 mg/dL (ref 1.5–2.5)

## 2013-12-18 MED ORDER — POTASSIUM CHLORIDE 10 MEQ/100ML IV SOLN
10.0000 meq | INTRAVENOUS | Status: AC
  Administered 2013-12-18 (×3): 10 meq via INTRAVENOUS
  Filled 2013-12-18 (×3): qty 100

## 2013-12-18 MED ORDER — PROCHLORPERAZINE EDISYLATE 5 MG/ML IJ SOLN
INTRAMUSCULAR | Status: AC
  Administered 2013-12-18: 10 mg via INTRAVENOUS
  Filled 2013-12-18: qty 2

## 2013-12-18 MED ORDER — POTASSIUM CHLORIDE CRYS ER 20 MEQ PO TBCR
40.0000 meq | EXTENDED_RELEASE_TABLET | Freq: Two times a day (BID) | ORAL | Status: AC
  Filled 2013-12-18 (×2): qty 2

## 2013-12-18 MED ORDER — PROCHLORPERAZINE EDISYLATE 5 MG/ML IJ SOLN
10.0000 mg | Freq: Four times a day (QID) | INTRAMUSCULAR | Status: DC | PRN
  Administered 2013-12-18 – 2013-12-23 (×9): 10 mg via INTRAVENOUS
  Filled 2013-12-18 (×9): qty 2

## 2013-12-18 NOTE — Progress Notes (Signed)
[facility] PROGRESS NOTE  Lacey [name]:[id number] DOB: 1955-11-07 DOA: 11/10/2013 PCP: Vena Austria, MD  Brief summary  59 y.o. female with history of Hodgkin's lymphoma status post chemotherapy now with myelodysplastic syndrome who initially presented with abdominal pain, nausea, vomiting, hypotension and complicated with adrenal infarct, adrenal insufficiency, HCAP, recurrent sepsis, hypotension RNF<-->ICU/pressure support+IV atx;  -She also developed hemorrhage into her left iliopsoas muscle which caused weakness and pain of her left leg and LLQ. -Dr. Marin Olp started her on [place]aver for her MDS. -Now waiting for insurance auth to go to SNF. -Also, may need another platelet/PRBC transfusion prior to DC.  Assessment/Plan  1. Myelodysplastic syndrome in setting of Hodgkin Lymphoma:  -Refractory anemia and thrombocytopenia requiring almost daily blood product transfusions, Although for the past 6 days counts appear to be somewhat stable.  Bone Marrow Biopsy: HYPERCELLULAR BONE MARROW FOR AGE WITH DYSPOIETIC CHANGES. -myelodysplastic/myeloproliferative process, particularly chronic myelomonocytic leukemia - Appreciate Dr Jonette Eva assistance. on frequent daily transfusions (PRBC+platelets) as patient is developing antibodies. Per hem/onc, started Vidaza on 3/11, since stopped -3/17: Plt down to 14. Watch closely for need for transfusion (if <10 or active bleeding). -3/17: Hb down to 7.1. -3/18 - Hb to 6.8 - 2 units PRBC's transfused - Hgb trending down, currently 8.4  2. Recurrent fever/septic shock, pneumonia Likely due to dysfunctional  leukocytes as above  -CT chest on 2/23: Persistent consolidation RML with new peribronchovascular opacities suggestive of bronchopneumonia. Underwent bronch on 2/26; BCx NGTD,  Diff PCR neg, BAL: PCP neg, Bacterial, fungal, and AFB cultures NGTD  - 3/6: febrile; CT: Persistent right upper lobe collapse/ consolidation with interval  development of central airspace disease in the left upper lobe  - 3/6: restarted IV atx, antifungals, acyclovir  -off pressors; clinically improving, started taper IV steroids 3/10  -3/11: PICC line is out. Will stop all antibiotics and antifungals as all cx data has been negative to date. Hopefully fevers and "sepsis" will not recur. -3/17: no further signs of sepsis since discontinuation of antibiotics.  3. Acute on chronic diastolic heart failure due to IVF from resuscitation. Echo (2/15): Preserved EF and grade 1 diastolic heart failure -R sided pleural effusion; anasarca, cont IV lasix; Daily weights and strict I/O; monitor renal function -Per pulmonology deferred thoracentesis due to thrombocytopenia, R sided pleural effusion   -Is 18 L negative since admission.  4. 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  5. Acute Renal Failure due to septic shock; RUS wnl. Hypervolemic  - resumed lasix due to respiratory status; close monitor   6. Spontaneous retroperitoneal bleed in setting of thrombocytopenia; hemorrhage into her left iliopsoas muscle which caused weakness and pain of her left leg and LLQ - on fentanyl; cont pain control  -Resume PT/OT.  7. Right upper extremity superficial thrombosis, stable   8. Transaminitis due to septic shock, resolved.   9. Urge and stress incontinence; Foley placed for comfort   10. Tongue ulcer; cont as above; biopsied by ENT; path: SQUAMOUS EPITHELIUM. NO DYSPLASIA OR MALIGNANCY -improving   11. Hypokalemia -Repleted.  12. Progressive Rash - Surgery consulted for skin biopsy - await biopsy results  13. Nausea/vomiting - Plain film xray without obstruction - Stool output documented on 3/16 and again on 3/20 - Urine cx with 6,000 gm + cocci, pending speciation and sensitivities  Proph: Hold for thrombocytopenia, spont retroperitoneal bleed   Code Status: full  Family Communication:  patient alone,  Disposition Plan: SNF  Consultants:  Dr. Burney Gauze (oncology)  Dr Gibson Ramp (Surgery) curbside consult  Dr. Carlyle Basques (infectious disease)    Procedures:  11/12/2013 right upper extremity Doppler  No evidence of deep vein thrombosis involving the right upper extremity, left subclavian vein, and left internal jugular vein. There is superficial thrombosis noted in the right cephalic vein.  Old PICC removed 3/5; Left-sided PICC 03/06 >>>  Foley 3/2 >>>   -2/12 Bone marrow biopsy report pending  Echocardiogram 01/10/2013  - Left ventricle: mild focal basal hypertrophy of the septum.  -LVEF= ejection fraction was 55%. Wall motion was normal; there were no regional wall motion abnormalities.  -(grade 1 diastolic dysfunction).  - Aortic valve: There was no stenosis. Trivial regurgitation. - Mitral valve: Trivial regurgitation. - Left atrium: The atrium was mildly dilated. - Right ventricle: The cavity size was normal. Systolic function was normal. - Tricuspid valve: Peak RV-RA gradient: 19mm Hg (S). - Pulmonary arteries: PA peak pressure: 44mm Hg (S). - Inferior vena cava: The vessel was normal in size; the respirophasic diameter changes were in the normal range (=50%); findings are consistent with normal central venous pressure.  Antibiotics:  None at present  2/15 Upper extremity Dopplers demonstrated acute clot of the right cephalic vein  1/77 Chest CT demonstrated loculated right pleural effusion which does not appear to have intact  2/17 Echocardiogram demonstrates EF of 93-90% with diastolic dysfunction.  2/24 Abdominal CT demonstrated hemorrhage in the left iliacus muscle  2/23 Chest CT demonstrated persistent right pleural effusion and right middle lobe density  2/25 Head CT demonstrated sphenoid sinusitis  2/26 Bronchoscopy did not reveal an infectious organism  3/3 Lower extremity Dopplers did not detect DVTs  3/6 Chest CT> persistent collapse  of the RML, RLL with a large pleural effusion. Development of a new left airspace opacity.  3/8 TTE >>> Ef 65%, no RWMA, PAP 35 torr  3/8 Abdomen CT >>> No findings consistent with sepsis, evolution of L iliac hematoma  3/8 [place] R Effusion>>>small in volume, loculated & complex appearing by [place], NO [place]  Consultants:  Oncology  HPI/Subjective: No acute events noted overnight  Objective: Filed Vitals:   12/18/13 0618  BP: 138/62  Pulse: 115  Temp: 98.4 F (36.9 C)  Resp: 20    Intake/Output Summary (Last 24 hours) at 12/18/13 1055 Last data filed at 12/18/13 0656  Gross per 24 hour  Intake 796.67 ml  Output   1850 ml  Net -1053.33 ml   Filed Weights   12/16/13 0545 12/17/13 0700 12/18/13 0618  Weight: 76.5 kg (168 lb 10.4 oz) 76.6 kg (168 lb 14 oz) 76.5 kg (168 lb 10.4 oz)    Exam:   General:  alert  Cardiovascular: s1,s2 rrr  Respiratory: few LL crackles   Abdomen: soft, nt, nd   Musculoskeletal: LE edema   Data Reviewed: Basic Metabolic Panel:  Recent Labs Lab 12/14/13 0410 12/15/13 0405 12/16/13 0446 12/17/13 0540 12/18/13 0520  NA 137 138 137 135* 139  K 3.6* 3.5* 3.5* 3.3* 2.9*  CL 99 101 100 100 103  CO2 $Re'25 23 24 23 24  'IAf$ GLUCOSE 119* 123* 117* 114* 115*  BUN $Re'15 17 15 9 6  'bVx$ CREATININE 1.27* 1.21* 1.15* 0.95 0.92  CALCIUM 8.9 9.2 8.8 8.6 8.7  MG  --   --   --   --  1.6   Liver Function Tests:  Recent Labs Lab 12/14/13 0410 12/15/13 0405 12/16/13 0446 12/17/13 0540 12/18/13 0520  AST  $'14 13 11 16 13  'B$ ALT '16 13 11 14 13  '$ ALKPHOS 102 102 101 113 107  BILITOT 0.8 0.7 0.6 0.7 0.8  PROT 7.4 7.5 7.1 6.4 6.5  ALBUMIN 2.6* 2.6* 2.4* 2.3* 2.2*    Recent Labs Lab 12/15/13 0405  LIPASE 57  AMYLASE 86   No results found for this basename: AMMONIA,  in the last 168 hours CBC:  Recent Labs Lab 12/14/13 0410 12/15/13 0405 12/16/13 0446 12/17/13 0540 12/18/13 0520  WBC 8.6 8.8 8.7 8.2 8.4  NEUTROABS 5.4 5.9 5.6 5.2 5.1  HGB 8.0* 10.3*  9.2* 8.4* 8.2*  HCT 23.6* 30.3* 28.3* 24.9* 24.9*  MCV 84.6 83.7 86.0 85.3 86.2  PLT 21* 25* 22* 15* 17*   Cardiac Enzymes: No results found for this basename: CKTOTAL, CKMB, CKMBINDEX, TROPONINI,  in the last 168 hours BNP (last 3 results)  Recent Labs  11/14/13 0435 11/17/13 0800 11/20/13 0515  PROBNP 2977.0* 9541.0* 3178.0*   CBG: No results found for this basename: GLUCAP,  in the last 168 hours  Recent Results (from the past 240 hour(s))  URINE CULTURE     Status: None   Collection Time    12/15/13 10:30 AM      Result Value Ref Range Status   Specimen Description URINE, CATHETERIZED   Final   Special Requests Immunocompromised   Final   Culture  Setup Time     Final   Value: 12/15/2013 13:04     Performed at [facility]     Final   Value: 6,000 COLONIES/ML     Performed at [facility]   Culture     Final   Value: [place]     Performed at [facility]   Report Status PENDING   Incomplete     Studies: No results found.  Scheduled Meds: . DULoxetine  60 mg Oral QPM  . fluticasone  2 spray Each Nare Daily  . hydrocortisone  10 mg Oral BID  . pantoprazole  40 mg Oral QHS  . potassium chloride  10 mEq Intravenous Q1 Hr x 3  . potassium chloride  40 mEq Oral BID  . sodium chloride  10-40 mL Intracatheter Q12H  . sodium chloride  3 mL Intravenous Q12H  . triamcinolone cream   Topical TID   Continuous Infusions: . sodium chloride 50 mL/hr at 12/18/13 3785    Active Problems:   Myelodysplasia   Symptomatic anemia   Adrenal infarction   Acute renal failure   Acute on chronic diastolic heart failure   Altered mental status   Acute-on-chronic respiratory failure   Hypotension, unspecified   Severe sepsis   DVT (deep venous thrombosis)   Pleural effusion   Thrombocytopenia   Hypokalemia  Time spent: 25 minutes   [facility], [facility] Pager [phone]. If 7PM-7AM, please contact  night-coverage at [email or web address], password [facility] 12/18/2013, 10:55 AM  LOS: 41 days

## 2013-12-18 NOTE — Progress Notes (Signed)
CRITICAL VALUE ALERT  Critical value received:  Potassium 2.9  Date of notification:  12/18/2013  Time of notification:  0628  Critical value read back:yes  Nurse who received alert:  Star Age, RN  MD notified (1st page):  Gorden Harms, NP  Time of first page:  [phone]  MD notified (2nd page):  Time of second page:  Responding MD:  Fredirick Maudlin, NP  Time MD responded:  [phone]

## 2013-12-18 NOTE — Progress Notes (Signed)
PT Cancellation Note  Patient Details Name: Lacey [name] MRN: [id number] DOB: 05-10-1956   Cancelled Treatment:    Reason Eval/Treat Not Completed: Medical issues which prohibited therapy (pt has been vomitting.)   [name] 12/18/2013, 1:08 [id number]

## 2013-12-19 ENCOUNTER — Inpatient Hospital Stay (HOSPITAL_COMMUNITY): Payer: BC Managed Care – PPO

## 2013-12-19 DIAGNOSIS — R112 Nausea with vomiting, unspecified: Secondary | ICD-10-CM

## 2013-12-19 DIAGNOSIS — R4182 Altered mental status, unspecified: Secondary | ICD-10-CM

## 2013-12-19 LAB — URINALYSIS, ROUTINE W REFLEX MICROSCOPIC
BILIRUBIN URINE: NEGATIVE
Glucose, UA: NEGATIVE mg/dL
Ketones, ur: NEGATIVE mg/dL
Leukocytes, UA: NEGATIVE
Nitrite: NEGATIVE
Protein, ur: 30 mg/dL — AB
SPECIFIC GRAVITY, URINE: 1.005 (ref 1.005–1.030)
UROBILINOGEN UA: 0.2 mg/dL (ref 0.0–1.0)
pH: 6.5 (ref 5.0–8.0)

## 2013-12-19 LAB — RETICULOCYTES
RBC.: 2.73 MIL/uL — ABNORMAL LOW (ref 3.87–5.11)
Retic Ct Pct: <0.4 % — ABNORMAL LOW (ref 0.4–3.1)

## 2013-12-19 LAB — URINE MICROSCOPIC-ADD ON

## 2013-12-19 LAB — CBC WITH DIFFERENTIAL/PLATELET
Basophils Absolute: 0.1 10*3/uL (ref 0.0–0.1)
Basophils Relative: 1 % (ref 0–1)
Eosinophils Absolute: 0.1 10*3/uL (ref 0.0–0.7)
Eosinophils Relative: 1 % (ref 0–5)
HCT: 23.3 % — ABNORMAL LOW (ref 36.0–46.0)
Hemoglobin: 7.8 g/dL — ABNORMAL LOW (ref 12.0–15.0)
LYMPHS ABS: 2.5 10*3/uL (ref 0.7–4.0)
LYMPHS PCT: 21 % (ref 12–46)
MCH: 28.6 pg (ref 26.0–34.0)
MCHC: 33.5 g/dL (ref 30.0–36.0)
MCV: 85.3 fL (ref 78.0–100.0)
Monocytes Absolute: 0.9 10*3/uL (ref 0.1–1.0)
Monocytes Relative: 8 % (ref 3–12)
Neutro Abs: 8.1 10*3/uL — ABNORMAL HIGH (ref 1.7–7.7)
Neutrophils Relative %: 69 % (ref 43–77)
Platelets: 14 10*3/uL — CL (ref 150–400)
RBC: 2.73 MIL/uL — ABNORMAL LOW (ref 3.87–5.11)
RDW: 16.7 % — AB (ref 11.5–15.5)
WBC: 11.7 10*3/uL — AB (ref 4.0–10.5)

## 2013-12-19 LAB — COMPREHENSIVE METABOLIC PANEL
ALK PHOS: 100 U/L (ref 39–117)
ALT: 13 U/L (ref 0–35)
AST: 14 U/L (ref 0–37)
Albumin: 2.1 g/dL — ABNORMAL LOW (ref 3.5–5.2)
BUN: 6 mg/dL (ref 6–23)
CHLORIDE: 101 meq/L (ref 96–112)
CO2: 23 meq/L (ref 19–32)
Calcium: 8.6 mg/dL (ref 8.4–10.5)
Creatinine, Ser: 1.02 mg/dL (ref 0.50–1.10)
GFR, EST AFRICAN AMERICAN: 68 mL/min — AB (ref >90)
GFR, EST NON AFRICAN AMERICAN: 59 mL/min — AB (ref >90)
GLUCOSE: 131 mg/dL — AB (ref 70–99)
Potassium: 2.9 mEq/L — CL (ref 3.7–5.3)
Sodium: 136 mEq/L — ABNORMAL LOW (ref 137–147)
Total Bilirubin: 1.3 mg/dL — ABNORMAL HIGH (ref 0.3–1.2)
Total Protein: 6.3 g/dL (ref 6.0–8.3)

## 2013-12-19 LAB — MAGNESIUM: MAGNESIUM: 1.5 mg/dL (ref 1.5–2.5)

## 2013-12-19 LAB — URINE CULTURE: Colony Count: 6000

## 2013-12-19 MED ORDER — POTASSIUM CHLORIDE CRYS ER 20 MEQ PO TBCR
40.0000 meq | EXTENDED_RELEASE_TABLET | Freq: Two times a day (BID) | ORAL | Status: DC
  Filled 2013-12-19 (×2): qty 2

## 2013-12-19 MED ORDER — METOCLOPRAMIDE HCL 5 MG/ML IJ SOLN
10.0000 mg | Freq: Three times a day (TID) | INTRAMUSCULAR | Status: DC
  Administered 2013-12-19 – 2013-12-20 (×8): 10 mg via INTRAVENOUS
  Filled 2013-12-19 (×14): qty 2

## 2013-12-19 MED ORDER — LINEZOLID 2 MG/ML IV SOLN
600.0000 mg | Freq: Two times a day (BID) | INTRAVENOUS | Status: DC
  Administered 2013-12-19 – 2013-12-22 (×7): 600 mg via INTRAVENOUS
  Filled 2013-12-19 (×8): qty 300

## 2013-12-19 MED ORDER — ENSURE COMPLETE PO LIQD
237.0000 mL | Freq: Three times a day (TID) | ORAL | Status: DC
  Administered 2013-12-19 – 2013-12-22 (×4): 237 mL via ORAL

## 2013-12-19 MED ORDER — PANTOPRAZOLE SODIUM 40 MG PO TBEC
40.0000 mg | DELAYED_RELEASE_TABLET | Freq: Two times a day (BID) | ORAL | Status: DC
Start: 2013-12-19 — End: 2013-12-20
  Administered 2013-12-19 (×2): 40 mg via ORAL
  Filled 2013-12-19 (×3): qty 1

## 2013-12-19 MED ORDER — POTASSIUM CHLORIDE 10 MEQ/50ML IV SOLN
10.0000 meq | INTRAVENOUS | Status: AC
  Administered 2013-12-19 (×4): 10 meq via INTRAVENOUS
  Filled 2013-12-19 (×4): qty 50

## 2013-12-19 MED ORDER — SODIUM CHLORIDE 0.9 % IV SOLN
500.0000 mg | Freq: Three times a day (TID) | INTRAVENOUS | Status: DC
  Administered 2013-12-19 – 2013-12-22 (×9): 500 mg via INTRAVENOUS
  Filled 2013-12-19 (×10): qty 500

## 2013-12-19 MED ORDER — MAGNESIUM SULFATE 40 MG/ML IJ SOLN
2.0000 g | Freq: Once | INTRAMUSCULAR | Status: AC
  Administered 2013-12-19: 2 g via INTRAVENOUS
  Filled 2013-12-19: qty 50

## 2013-12-19 NOTE — Progress Notes (Signed)
Lacey [name] is still having some issues with nausea and vomiting. She had an abdominal film over the weekend. This fluid was unremarkable. In listening to her abdomen, her bowel sounds are quite decreased. I will go ahead and start some Reglan on her. I noted that her potassium was low. We'll do some IV potassium.  The rash on her on his nonspecific according to the pathologist. Again, I am not sure what triggered this. If it is the Vidaza, the be very very rare. I will need to talk to our pathologist about this.  She's not able to do physical therapy because of her vomiting.  I am checking a urine specimen on her make sure she did not have a urinary tract infection that could be causing the vomiting. She did have a temperature up to 101.2. We will have to watch this very closely. Her amylase and lipase were okay so it's not pancreatitis. Her white cell count is up a little bit. Hemoglobin is 7.8. Platelets 14,000. There is no bleeding.  She's doing OK pain-wise.  Her vital signs look okay. She's afebrile. Blood pressure 104/80. Pulse is 114. Her lungs are clear. She does have a small ulcer on the left side of her tongue. The ulcer on the right side still looks as if his try to heal. Cardiac exam is tachycardic but regular. Abdomen is soft. Bowel sounds are markedly decreased. Abdomen is not distended. There is no guarding or rebound tenderness. Extremities shows the rash on the upper extremities. This appears to be the same.  I am not sure, again, as to the etiology for this vomiting. I suppos. She has a temperature. We're are going to have to be very, cautious with respect to her having another "bout" of the sepsis.  She just is not ready for discharge to a rehabilitation facility yet.  I would appreciate everybody's help with trying to get her better. This is a monumental effort and everybody's input and compassionate care are very, very much appreciated!!  Pete E.  [place] 4:6-7

## 2013-12-19 NOTE — Progress Notes (Addendum)
Occupational Therapy Treatment and Goal Update Patient Details Name: Lacey [name] MRN: [id number] DOB: 01/05/1956 Today's Date: 12/19/2013    History of present illness     Per chart: 58 y.o. WF PMHx Hodgkin's lymphoma status post chemotherapy in the pas has myelodysplastic syndrome. The patient is coming from home. Patient presents with complaints of nausea vomiting and right-sided abdominal pain. The patient mentions that her symptoms started a month ago with cough and yellowish expectoration for which she completed a course of amoxicillin despite which her cough was not getting better.since last one month she started having dyspnea on exertion which is progressively worsening.denies any orthopnea or PND.   Spontaneous retroperitoneal bleed, now with L quad weakness.  Pt has been between ICU and telemetry unit.  Husband died from MI during the course of her hospitalization   OT comments  Pt tolerated oob transfer to 3:1 today.  Goals were updated  Follow Up Recommendations  SNF    Equipment Recommendations  3 in 1 bedside comode    Recommendations for Other Services      Precautions / Restrictions Precautions Precautions: Fall Precaution Comments: L LE buckling Required Braces or Orthoses: Knee Immobilizer - Left Restrictions Weight Bearing Restrictions: No       Mobility Bed Mobility Overal bed mobility: Needs Assistance Bed Mobility: Supine to Sit;Sit to Supine   Sidelying to sit: Max assist;+2 for physical assistance;+2 for safety/equipment Supine to sit: Max assist;+2 for physical assistance;+2 for safety/equipment     General bed mobility comments: assist for legs and trunk plus increased time. Utilized bedpad for scooting, positioning   Transfers Overall transfer level: Needs assistance Equipment used: Rolling walker (2 wheeled) Transfers: Sit to/from [employer] Transfers Sit to Stand: Max assist;+2 physical assistance;+2  safety/equipment Stand pivot transfers: Max assist;+2 physical assistance;+2 safety/equipment Squat pivot transfers: Max assist;+2 physical assistance;+2 safety/equipment     General transfer comment: Assist to rise, stabilize, control descent, maneuver with walker. Squat pivot to [facility], Stand pivot with walker back to bed.     Balance Overall balance assessment: Needs assistance Sitting-balance support: Bilateral upper extremity supported;Feet supported Sitting balance-Leahy Scale: Poor Sitting balance - Comments: Leaning posteriorly. Mod assist to correct.    Standing balance support: Bilateral upper extremity supported;During functional activity Standing balance-Leahy Scale: Poor                     ADL             Toilet Transfer: +2 for physical assistance;Maximal assistance;Stand-pivot Toileting- Clothing Manipulation and Hygiene: +2 for safety/equipment;+2 for physical assistance;Total assistance;Sit to/from stand            Vision                     Perception     Praxis      Cognition   Behavior During Therapy: Flat affect Overall Cognitive Status: Within Functional Limits for tasks assessed                       Extremity/Trunk Assessment               Exercises       General Comments  Used L KI     Pertinent Vitals/ Pain      Pain in neck and L hip--6/10.  Premedicated.  Repositioned in bed     sats 93% ra, HR 155; BP 99/62 sitting  Home Living  Prior Functioning/Environment              Frequency Min 2X/week     Progress Toward Goals  OT Goals(current goals can now be found in the care plan section)  Progress towards OT goals: Goals drowngraded-see care plan  Acute Rehab OT Goals OT Goal Formulation: With patient Time For Goal Achievement: 01/02/14 Potential to Achieve Goals: Good ADL Goals Pt Will Transfer to Toilet: with +2 assist;with mod  assist;bedside commode;stand pivot transfer;squat pivot transfer Additional ADL Goal #1: Pt will stand for 2 minutes with mod A for LB adls/hygiene Additional ADL Goal #2: Pt will tolerate 30 minutes of activity with 2 rest breaks sit to stand  Plan      End of Session    Activity Tolerance Patient limited by fatigue   Patient Left in bed;with call bell/phone within reach;with family/visitor present   Nurse Communication          Time: 7322-0254 OT Time Calculation (min): 25 min  Charges: OT General Charges $OT Visit: 1 Procedure OT Treatments $Self Care/Home Management : 8-22 mins  Katina Remick 12/19/2013, 1:04 PM   [name], OTR/L [phone] 12/19/2013

## 2013-12-19 NOTE — Progress Notes (Signed)
CRITICAL VALUE ALERT  Critical value received:  Potassium 2.9  Date of notification:  12/19/2013  Time of notification:  0620  Critical value read back:yes  Nurse who received alert:  Star Age, RN  MD notified (1st page):  Tylene Fantasia, NP  Time of first page:  [phone]  MD notified (2nd page):  Time of second page:  Responding MD: Tylene Fantasia, NP  Time MD responded: [phone]

## 2013-12-19 NOTE — Progress Notes (Signed)
Chaplain follow up with pt's HCPOA, [name].  Provided support around exhaustion, concern for pt's will to live.   [name] expressed fears that pt is "giving up" and is searching for ways to connect and motivate patient to keep fighting.

## 2013-12-19 NOTE — Progress Notes (Signed)
Patient is now febrile. CSW continues to follow.  Sherlyn Ebbert C. [place] MSW, [place]

## 2013-12-19 NOTE — Progress Notes (Signed)
Physical Therapy Treatment Patient Details Name: Lacey [name] MRN: [id number] DOB: August 24, 1956 Today's Date: 12/19/2013    History of Present Illness 58 y.o. WF PMHx Hodgkin's lymphoma status post chemotherapy in the pas has myelodysplastic syndrome. The patient is coming from home. Patient presents with complaints of nausea vomiting and right-sided abdominal pain. The patient mentions that her symptoms started a month ago with cough and yellowish expectoration for which she completed a course of amoxicillin despite which her cough was not getting better.since last one month she started having dyspnea on exertion which is progressively worsening.denies any orthopnea or PND.   Spontaneous retroperitoneal bleed, now with L quad weakness.    PT Comments    Pt agreeable to participate with OT/PT-assisted pt onto [facility] then back to bed. Used KI to aid with L knee buckling-this helped a little but still noted some buckling. Pt is very deconditioned. Continue to recommend SNF.   Follow Up Recommendations  SNF     Equipment Recommendations  None recommended by PT    Recommendations for Other Services OT consult     Precautions / Restrictions Precautions Precautions: Fall Precaution Comments: L LE buckling Required Braces or Orthoses: Knee Immobilizer - Left Restrictions Weight Bearing Restrictions: No    Mobility  Bed Mobility Overal bed mobility: Needs Assistance Bed Mobility: Supine to Sit;Sit to Supine   Sidelying to sit: Max assist;+2 for physical assistance;+2 for safety/equipment Supine to sit: Max assist;+2 for physical assistance;+2 for safety/equipment     General bed mobility comments: assist for legs and trunk plus increased time. Utilized bedpad for scooting, positioning   Transfers Overall transfer level: Needs assistance Equipment used: Rolling walker (2 wheeled) Transfers: Sit to/from [employer] Transfers Sit to Stand: Max assist;+2  physical assistance;+2 safety/equipment Stand pivot transfers: Max assist;+2 physical assistance;+2 safety/equipment Squat pivot transfers: Max assist;+2 physical assistance;+2 safety/equipment     General transfer comment: Assist to rise, stabilize, control descent, maneuver with walker. Squat pivot to [facility], Stand pivot with walker back to bed.   Ambulation/Gait                 Stairs            Wheelchair Mobility    Modified Rankin (Stroke Patients Only)       Balance Overall balance assessment: Needs assistance Sitting-balance support: Bilateral upper extremity supported;Feet supported Sitting balance-Leahy Scale: Poor Sitting balance - Comments: Leaning posteriorly. Mod assist to correct.    Standing balance support: Bilateral upper extremity supported;During functional activity Standing balance-Leahy Scale: Poor                      Cognition Arousal/Alertness: Awake/alert Behavior During Therapy: Flat affect Overall Cognitive Status: Within Functional Limits for tasks assessed                      Exercises      General Comments        Pertinent Vitals/Pain 93% RA, 155 bpm during session    Home Living                      Prior Function            PT Goals (current goals can now be found in the care plan section) Progress towards PT goals: Progressing toward goals (slowly)    Frequency  Min 3X/week    PT Plan Current plan remains appropriate  End of Session   Activity Tolerance: Patient limited by fatigue;Patient limited by pain Patient left: with call bell/phone within reach;with bed alarm set     Time: 4540-9811 PT Time Calculation (min): 25 min  Charges:  $Therapeutic Activity: 8-22 mins                    G Codes:      Weston Anna, MPT Pager: [phone]

## 2013-12-19 NOTE — Progress Notes (Signed)
INITIAL NUTRITION ASSESSMENT  DOCUMENTATION CODES Per approved criteria  -Obesity Unspecified   INTERVENTION: - Ensure Complete TID - Encouraged increased PO intake as nausea/vomiting resolves - RD to continue to monitor   NUTRITION DIAGNOSIS: Inadequate oral intake related to nausea/vomiting as evidenced by pt report.    Goal: 1. Resolution of nausea/vomiting 2. Pt to consume >90% of meals/supplements  Monitor:  Weights, labs, intake, nausea/vomiting   Reason for Assessment: Poor intake   58 y.o. female  Admitting Dx: Septic shock  ASSESSMENT: Met with pt earlier in admission on 2/23, during which time she was eating excellent and had reported eating well PTA and denied any changes in weight. Inpatient RD met with pt 3/16 to discuss diet therapy for sore mouth and changes in taste/smell. During RD visit, pt had reported her appetite was improving and was drinking Ensure Complete occasionally.   Pt's husband died suddenly 12-21-2022. Noted pt started having vomiting and several episodes of nausea 3/19. Last chemo was 3/16.   Met with pt who reports having nausea/vomiting for the past 2 weeks. Last documented PO intake was 2022-12-21 with pt consuming 50% of meals. Pt agreeable to getting scheduled Ensure Complete. Noted pt's weight down 26 pounds since November 2014.   Height: Ht Readings from Last 1 Encounters:  12/02/13 5' (1.524 m)    Weight: Wt Readings from Last 1 Encounters:  12/19/13 169 lb 1.5 oz (76.7 kg)    Ideal Body Weight: 100 lb   % Ideal Body Weight: 169%  Wt Readings from Last 10 Encounters:  12/19/13 169 lb 1.5 oz (76.7 kg)  12/19/13 169 lb 1.5 oz (76.7 kg)  08/10/13 195 lb (88.451 kg)  07/06/13 199 lb (90.266 kg)  06/29/13 199 lb (90.266 kg)  04/26/13 197 lb (89.359 kg)  01/04/13 197 lb (89.359 kg)  07/06/12 203 lb (92.08 kg)  01/05/12 204 lb (92.534 kg)    Usual Body Weight: 195 lb in November 2014  % Usual Body Weight: 87%  BMI:  Body mass index  is 33.02 kg/(m^2). Class I obesity  Estimated Nutritional Needs: Kcal: 1300-1500 Protein: 55-70g Fluid: 1.3-1.5L/day  Skin: +1 generalized edema, +1 RUE, LUE, LLE, RLE edema   Diet Order: General  EDUCATION NEEDS: -No education needs identified at this time   Intake/Output Summary (Last 24 hours) at 12/19/13 1556 Last data filed at 12/19/13 1500  Gross per 24 hour  Intake 1038.33 ml  Output   2800 ml  Net -1761.67 ml    Last BM: 3/23   Labs:   Recent Labs Lab 12/17/13 0540 12/18/13 0520 12/19/13 0430  NA 135* 139 136*  K 3.3* 2.9* 2.9*  CL 100 103 101  CO2 _0 BUN _1 CREATININE 0.95 0.92 1.02  CALCIUM 8.6 8.7 8.6  MG  --  1.6 1.5  GLUCOSE 114* 115* 131*    CBG (last 3)  No results found for this basename: GLUCAP,  in the last 72 hours  Scheduled Meds: . DULoxetine  60 mg Oral QPM  . fluticasone  2 spray Each Nare Daily  . hydrocortisone  10 mg Oral BID  . imipenem-cilastatin  500 mg Intravenous 3 times per day  . linezolid  600 mg Intravenous Q12H  . metoCLOPramide (REGLAN) injection  10 mg Intravenous TID AC & HS  . pantoprazole  40 mg Oral BID WC  . sodium chloride  10-40 mL Intracatheter Q12H  . sodium chloride  3 mL Intravenous Q12H  .  triamcinolone cream   Topical TID    Continuous Infusions: . sodium chloride 50 mL/hr at 12/18/13 2221    Past Medical History  Diagnosis Date  . Cancer   . Cough productive of clear sputum 01/04/2013  . Fibromyalgia     Past Surgical History  Procedure Laterality Date  . Bone marrow biopsy  05/20110  . Spine surgery  1998    c-4 c 5 fusion  . Hodgkin      Mikey College MS, [place], LDN [phone] Pager [phone] After Hours Pager

## 2013-12-19 NOTE — Progress Notes (Addendum)
ANTIBIOTIC CONSULT NOTE - INITIAL  Pharmacy Consult for Primaxin Indication: rule out sepsis  Allergies  Allergen Reactions  . Penicillins Hives and Rash  . Contrast Media [Iodinated Diagnostic Agents]     Patient Measurements: Height: 5' (152.4 cm) Weight: 169 lb 1.5 oz (76.7 kg) IBW/kg (Calculated) : 45.5  Vital Signs: Temp: 97.3 F (36.3 C) (03/24 0546) Temp src: Axillary (03/24 0546) BP: 104/80 mmHg (03/24 0546) Pulse Rate: 114 (03/24 0546) Intake/Output from previous day: 03/23 0701 - 03/24 0700 In: 1376.7 [P.O.:180; I.V.:1196.7] Out: 2300 [Urine:2300] Intake/Output from this shift: Total I/O In: 60 [P.O.:60] Out: 500 [Urine:500]  Labs:  Recent Labs  12/17/13 0540 12/18/13 0520 12/19/13 0430  WBC 8.2 8.4 11.7*  HGB 8.4* 8.2* 7.8*  PLT 15* 17* 14*  CREATININE 0.95 0.92 1.02   Estimated Creatinine Clearance: 54.4 ml/min (by C-G formula based on Cr of 1.02). No results found for this basename: VANCOTROUGH, Corlis Leak, VANCORANDOM, GENTTROUGH, GENTPEAK, GENTRANDOM, TOBRATROUGH, TOBRAPEAK, TOBRARND, AMIKACINPEAK, AMIKACINTROU, AMIKACIN,  in the last 72 hours   Medications:  Anti-infectives   Start     Dose/Rate Route Frequency Ordered Stop   12/19/13 1200  imipenem-cilastatin (PRIMAXIN) 500 mg in sodium chloride 0.9 % 100 mL IVPB     500 mg 200 mL/hr over 30 Minutes Intravenous 3 times per day 12/19/13 1025     12/19/13 1030  linezolid (ZYVOX) IVPB 600 mg     600 mg 300 mL/hr over 60 Minutes Intravenous Every 12 hours 12/19/13 1019     12/02/13 2230  ceFEPIme (MAXIPIME) 1 g in dextrose 5 % 50 mL IVPB  Status:  Discontinued     1 g 100 mL/hr over 30 Minutes Intravenous Every 12 hours 12/02/13 2223 12/06/13 1724   12/02/13 2200  linezolid (ZYVOX) IVPB 600 mg  Status:  Discontinued     600 mg 300 mL/hr over 60 Minutes Intravenous Every 12 hours 12/02/13 2145 12/06/13 1724   12/02/13 2200  micafungin (MYCAMINE) 100 mg in sodium chloride 0.9 % 100 mL IVPB   Status:  Discontinued     100 mg 100 mL/hr over 1 Hours Intravenous Every 24 hours 12/02/13 2145 12/06/13 1724   12/02/13 1200  vancomycin (VANCOCIN) 1,250 mg in sodium chloride 0.9 % 250 mL IVPB  Status:  Discontinued     1,250 mg 166.7 mL/hr over 90 Minutes Intravenous Every 24 hours 12/01/13 0802 12/02/13 2115   12/01/13 2200  ceFEPIme (MAXIPIME) 1 g in dextrose 5 % 50 mL IVPB  Status:  Discontinued     1 g 100 mL/hr over 30 Minutes Intravenous Every 12 hours 12/01/13 2116 12/02/13 2115   12/01/13 0915  ceFEPIme (MAXIPIME) 2 g in dextrose 5 % 50 mL IVPB     2 g 100 mL/hr over 30 Minutes Intravenous STAT 12/01/13 0904 12/01/13 1111   12/01/13 0800  vancomycin (VANCOCIN) 2,000 mg in sodium chloride 0.9 % 500 mL IVPB     2,000 mg 250 mL/hr over 120 Minutes Intravenous  Once 12/01/13 0745 12/01/13 1241   11/30/13 1000  fluconazole (DIFLUCAN) IVPB 200 mg  Status:  Discontinued     200 mg 100 mL/hr over 60 Minutes Intravenous Every 24 hours 11/30/13 0752 12/02/13 2115   11/30/13 0900  acyclovir (ZOVIRAX) 230 mg in dextrose 5 % 100 mL IVPB  Status:  Discontinued     5 mg/kg  45.5 kg (Ideal) 104.6 mL/hr over 60 Minutes Intravenous Every 8 hours 11/30/13 0752 12/06/13 1724   11/25/13 1800  ceFEPIme (MAXIPIME) 2 g in dextrose 5 % 50 mL IVPB  Status:  Discontinued     2 g 100 mL/hr over 30 Minutes Intravenous Every 24 hours 11/25/13 1243 11/27/13 0750   11/24/13 0800  vancomycin (VANCOCIN) 1,250 mg in sodium chloride 0.9 % 250 mL IVPB  Status:  Discontinued     1,250 mg 166.7 mL/hr over 90 Minutes Intravenous Every 24 hours 11/24/13 0706 11/27/13 0740   11/12/2013 1800  ceFEPIme (MAXIPIME) 1 g in dextrose 5 % 50 mL IVPB  Status:  Discontinued     1 g 100 mL/hr over 30 Minutes Intravenous Every 24 hours 11/22/13 1944 11/25/13 1243   11/13/2013 1000  voriconazole (VFEND) tablet 200 mg  Status:  Discontinued     200 mg Oral Every 12 hours 11/18/2013 0842 11/25/13 1214   11/19/2013 0800  vancomycin  (VANCOCIN) 1,250 mg in sodium chloride 0.9 % 250 mL IVPB     1,250 mg 166.7 mL/hr over 90 Minutes Intravenous  Once 11/21/2013 0618 10/30/2013 0930   11/22/13 2000  vancomycin (VANCOCIN) 1,250 mg in sodium chloride 0.9 % 250 mL IVPB  Status:  Discontinued     1,250 mg 166.7 mL/hr over 90 Minutes Intravenous Every 24 hours 11/22/13 1720 11/22/13 1926   11/22/13 1800  ceFEPIme (MAXIPIME) 2 g in dextrose 5 % 50 mL IVPB  Status:  Discontinued     2 g 100 mL/hr over 30 Minutes Intravenous Every 24 hours 11/22/13 1720 11/22/13 1944   11/22/13 1000  acyclovir (ZOVIRAX) 200 MG capsule 800 mg  Status:  Discontinued     800 mg Oral 3 times daily 11/22/13 0714 11/22/13 0727   11/22/13 1000  acyclovir (ZOVIRAX) tablet 800 mg  Status:  Discontinued     800 mg Oral 3 times daily 11/22/13 0727 11/22/13 1942   11/18/13 1000  acyclovir (ZOVIRAX) 200 MG capsule 400 mg  Status:  Discontinued     400 mg Oral 3 times daily 11/18/13 0734 11/19/13 0804   11/18/13 1000  fluconazole (DIFLUCAN) tablet 100 mg  Status:  Discontinued     100 mg Oral Daily 11/18/13 0734 10/29/2013 0807   11/16/13 1200  imipenem-cilastatin (PRIMAXIN) 500 mg in sodium chloride 0.9 % 100 mL IVPB  Status:  Discontinued     500 mg 200 mL/hr over 30 Minutes Intravenous Every 8 hours 11/16/13 0731 11/18/13 0734   11/15/13 1000  vancomycin (VANCOCIN) 1,250 mg in sodium chloride 0.9 % 250 mL IVPB     1,250 mg 166.7 mL/hr over 90 Minutes Intravenous Every 24 hours 11/14/13 0852 11/21/13 1125   11/14/13 1600  imipenem-cilastatin (PRIMAXIN) 250 mg in sodium chloride 0.9 % 100 mL IVPB  Status:  Discontinued     250 mg 200 mL/hr over 30 Minutes Intravenous 4 times per day 11/14/13 0856 11/16/13 0731   11/14/13 1000  voriconazole (VFEND) 360 mg in sodium chloride 0.9 % 150 mL IVPB  Status:  Discontinued     4 mg/kg  89.3 kg 93 mL/hr over 120 Minutes Intravenous Every 12 hours 11/13/13 0806 11/15/13 0807   11/14/13 1000  acyclovir (ZOVIRAX) 230 mg in  dextrose 5 % 100 mL IVPB  Status:  Discontinued     5 mg/kg  45.5 kg (Ideal) 104.6 mL/hr over 60 Minutes Intravenous Every 12 hours 11/14/13 0840 11/17/13 0714   11/14/13 0900  vancomycin (VANCOCIN) 2,000 mg in sodium chloride 0.9 % 500 mL IVPB     2,000 mg 250 mL/hr over  120 Minutes Intravenous  Once 11/14/13 0851 11/14/13 1456   11/14/13 0745  acyclovir (ZOVIRAX) 485 mg in dextrose 5 % 100 mL IVPB  Status:  Discontinued     5 mg/kg  96.6 kg 109.7 mL/hr over 60 Minutes Intravenous 3 times per day 11/14/13 0742 11/14/13 0839   11/13/13 1600  imipenem-cilastatin (PRIMAXIN) 500 mg in sodium chloride 0.9 % 100 mL IVPB  Status:  Discontinued     500 mg 200 mL/hr over 30 Minutes Intravenous Every 8 hours 11/13/13 0752 11/14/13 0856   11/13/13 0900  voriconazole (VFEND) 540 mg in sodium chloride 0.9 % 150 mL IVPB     6 mg/kg  89.3 kg 102 mL/hr over 120 Minutes Intravenous Every 12 hours 11/13/13 0806 11/14/13 0110   11/13/13 0830  imipenem-cilastatin (PRIMAXIN) 500 mg in sodium chloride 0.9 % 100 mL IVPB     500 mg 200 mL/hr over 30 Minutes Intravenous STAT 11/13/13 0750 11/13/13 1127   11/13/13 0730  ceFEPIme (MAXIPIME) 1 g in dextrose 5 % 50 mL IVPB  Status:  Discontinued     1 g 100 mL/hr over 30 Minutes Intravenous 3 times per day 11/13/13 0718 11/13/13 0750   11/10/13 2000  vancomycin (VANCOCIN) 500 mg in sodium chloride 0.9 % 100 mL IVPB  Status:  Discontinued     500 mg 100 mL/hr over 60 Minutes Intravenous Every 12 hours 11/10/13 1824 11/12/13 1500   11/08/13 1800  vancomycin (VANCOCIN) IVPB 750 mg/150 ml premix  Status:  Discontinued     750 mg 150 mL/hr over 60 Minutes Intravenous Every 12 hours 11/08/13 0307 11/10/13 1824   11/08/13 1000  ciprofloxacin (CIPRO) IVPB 400 mg  Status:  Discontinued     400 mg 200 mL/hr over 60 Minutes Intravenous Every 12 hours December 04, 2013 2325 11/11/13 0812   11/08/13 0600  aztreonam (AZACTAM) 1 g in dextrose 5 % 50 mL IVPB  Status:  Discontinued      1 g 100 mL/hr over 30 Minutes Intravenous 3 times per day 11/08/13 0307 11/10/13 1356   11/08/13 0030  vancomycin (VANCOCIN) IVPB 1000 mg/200 mL premix     1,000 mg 200 mL/hr over 60 Minutes Intravenous  Once 11/08/13 0020 11/08/13 0354   11/08/13 0000  aztreonam (AZACTAM) 2 g in dextrose 5 % 50 mL IVPB     2 g 100 mL/hr over 30 Minutes Intravenous  Once 04-Dec-2013 2357 11/08/13 0151   2013-12-04 2130  ciprofloxacin (CIPRO) IVPB 400 mg     400 mg 200 mL/hr over 60 Minutes Intravenous  Once 12-04-2013 2111 04-Dec-2013 2325     Assessment: 58yo F admitted 2/10 with N/V, abdominal pain. Has had recurrent fever, septic shock and pneumonia this admission on multiple abx combinations. ID has consulted this admission. There have been no positive cultures until VRE in the urine on 3/20 but no abx started d/t afebrile and WBCs wnl. On 3/24 she spiked a fever and WBCs rose, blood was re-cultured, Zyvox was resumed and pharmacy was asked to start Primaxin.  3/24 Primaxin >> 3/24 Zyvox >>  Tmax: 101.2 -> AF WBC: elev now. Renal: SCr wnl, 54CG, 67N  3/20 urine: VRE 3/24 blood x2: sent  Goal of Therapy:  Appropriate antibiotic dosing for renal function; eradication of infection  Plan:   Primaxin 500mg  IV q8h. Penicillin allergy but tolerated Primaxin earlier this admission.  Zyvox 600mg  IV q12h per MD. Previously ordered by CCM. Now patient has VRE in the urine. Follow up  renal fxn and culture results.  Romeo Rabon, PharmD, pager [phone]. 12/19/2013,10:33 AM.

## 2013-12-19 NOTE — Progress Notes (Addendum)
[facility] PROGRESS NOTE  Lacey [name] MIW:[id number] DOB: May 05, 1956 DOA: 11/08/2013 PCP: Vena Austria, MD Off Service Summary: 58 y.o. female with history of Hodgkin's lymphoma status post chemotherapy now with myelodysplastic syndrome who initially presented with abdominal pain, nausea, vomiting, hypotension and complicated with adrenal infarct, adrenal insufficiency, HCAP, recurrent sepsis, hypotension RNF<-->ICU/pressure support+IV atx;  -She also developed hemorrhage into her left iliopsoas muscle which caused weakness and pain of her left leg and LLQ.  -Dr. Marin Olp is determining what treatment to initiate for her MDS as she has required frequent transfusions.    Brief summary  58 y.o. female with history of Hodgkin's lymphoma status post chemotherapy now with myelodysplastic syndrome who initially presented with abdominal pain, nausea, vomiting, hypotension and complicated with adrenal infarct, adrenal insufficiency, HCAP, recurrent sepsis, hypotension RNF<-->ICU/pressure support+IV atx;  -She also developed hemorrhage into her left iliopsoas muscle which caused weakness and pain of her left leg and LLQ. -Dr. Marin Olp started her on [place] for her MDS, since stopped. -Pt remains PRBC and plt transfusion dependent - transfuse as needed - Pt began having n/v with abd pain. Imaging was unremarkable. UA is unremarkable. Urine cx did grow VRE, however, this is suspected to be colonization. Pt later noted to have fever and elevated WBC on 3/24 so empiric broad spectrum abx resumed, pending cx results.  Assessment/Plan  1. Myelodysplastic syndrome in setting of Hodgkin Lymphoma:  - Refractory anemia and thrombocytopenia requiring almost daily blood product transfusions, Although for the past 6 days counts appear to be somewhat stable.  Bone Marrow Biopsy: HYPERCELLULAR BONE MARROW FOR AGE WITH DYSPOIETIC CHANGES. -myelodysplastic/myeloproliferative process, particularly  chronic myelomonocytic leukemia - Appreciate Dr Jonette Eva assistance. on frequent transfusions (PRBC+platelets) as patient is developing antibodies. Per hem/onc, started Vidaza on 3/11, since stopped -3/17: Plt down to 14. Watch closely for need for transfusion (if <10 or active bleeding). -3/17: Hb down to 7.1. -3/18 - Hb to 6.8 - 2 units PRBC's transfused - Hgb trending down, currently 7.8 - possibly will need repeat tx soon  2. Recurrent fever/septic shock, pneumonia Likely due to dysfunctional  leukocytes as above  -CT chest on 2/23: Persistent consolidation RML with new peribronchovascular opacities suggestive of bronchopneumonia. Underwent bronch on 2/26; BCx NGTD,  Diff PCR neg, BAL: PCP neg, Bacterial, fungal, and AFB cultures NGTD  - 3/6: febrile; CT: Persistent right upper lobe collapse/ consolidation with interval development of central airspace disease in the left upper lobe  - 3/6: restarted IV atx, antifungals, acyclovir  -off pressors; clinically improving, started taper IV steroids 3/10  -3/11: PICC line is out. Will stop all antibiotics and antifungals as all cx data has been negative to date. Hopefully fevers and "sepsis" will not recur. -3/17: no further signs of sepsis since discontinuation of antibiotics. - 3/24: Pt noted to be febrile this AM with elevated WBC of 11/7 (from 8.4 yesterday). Unclear source. UA normal.  Blood cx ordered, pending. Will order CXR. Consider empiric broad spec abx pending cx results  3. Acute on chronic diastolic heart failure due to IVF from resuscitation. Echo (2/15): Preserved EF and grade 1 diastolic heart failure -R sided pleural effusion; anasarca, cont IV lasix; Daily weights and strict I/O; monitor renal function -Per pulmonology deferred thoracentesis due to thrombocytopenia, R sided pleural effusion   -Is 19 L negative since admission.  4. 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  5. Acute Renal Failure due to septic shock; RUS wnl.  Hypervolemic  - resumed lasix due to respiratory status; close monitor  - renal fx currently normalized  6. Spontaneous retroperitoneal bleed in setting of thrombocytopenia; hemorrhage into her left iliopsoas muscle which caused weakness and pain of her left leg and LLQ - on fentanyl; cont pain control  -Resume PT/OT.  7. Right upper extremity superficial thrombosis, stable   8. Transaminitis due to septic shock, resolved.   9. Urge and stress incontinence; Foley placed for comfort   10. Tongue ulcer; cont as above; biopsied by ENT; path: SQUAMOUS EPITHELIUM. NO DYSPLASIA OR MALIGNANCY -improving   11. Hypokalemia -Repleted.  12. Progressive Rash - Surgery consulted for skin biopsy - Reactive findings - nonspecific findings  13. Nausea/vomiting - Plain film xray without obstruction - Stool output documented on 3/16 and again on 3/20 - Urine cx with 6,000 gm + cocci, pending speciation and sensitivities - Urinalysis was unremarkable  Proph: Hold for thrombocytopenia, spont retroperitoneal bleed   Code Status: full  Family Communication: patient alone,  Disposition Plan: SNF  Consultants:  Dr. Burney Gauze (oncology)  Dr Gibson Ramp (Surgery) curbside consult  Dr. Carlyle Basques (infectious disease)    Procedures:  11/12/2013 right upper extremity Doppler  No evidence of deep vein thrombosis involving the right upper extremity, left subclavian vein, and left internal jugular vein. There is superficial thrombosis noted in the right cephalic vein.  Old PICC removed 3/5; Left-sided PICC 03/06 >>>  Foley 3/2 >>>   -2/12 Bone marrow biopsy report pending  Echocardiogram 01/10/2013  - Left ventricle: mild focal basal hypertrophy of the septum.  -LVEF= ejection fraction was 55%. Wall motion was normal; there were no regional wall motion abnormalities.  -(grade 1 diastolic dysfunction).  - Aortic  valve: There was no stenosis. Trivial regurgitation. - Mitral valve: Trivial regurgitation. - Left atrium: The atrium was mildly dilated. - Right ventricle: The cavity size was normal. Systolic function was normal. - Tricuspid valve: Peak RV-RA gradient: 68m Hg (S). - Pulmonary arteries: PA peak pressure: 259mHg (S). - Inferior vena cava: The vessel was normal in size; the respirophasic diameter changes were in the normal range (=50%); findings are consistent with normal central venous pressure.  Antibiotics:  None at present  2/15 Upper extremity Dopplers demonstrated acute clot of the right cephalic vein  2/9/50hest CT demonstrated loculated right pleural effusion which does not appear to have intact  2/17 Echocardiogram demonstrates EF of 5593-26%ith diastolic dysfunction.  2/24 Abdominal CT demonstrated hemorrhage in the left iliacus muscle  2/23 Chest CT demonstrated persistent right pleural effusion and right middle lobe density  2/25 Head CT demonstrated sphenoid sinusitis  2/26 Bronchoscopy did not reveal an infectious organism  3/3 Lower extremity Dopplers did not detect DVTs  3/6 Chest CT> persistent collapse of the RML, RLL with a large pleural effusion. Development of a new left airspace opacity.  3/8 TTE >>> Ef 65%, no RWMA, PAP 35 torr  3/8 Abdomen CT >>> No findings consistent with sepsis, evolution of L iliac hematoma  3/8 [place] Effusion>>>small in volume, loculated & complex appearing by [place]ult[place]:  Oncology  HPI/Subjective: No acute events noted overnight  Objective: Filed Vitals:   12/19/13 0546  BP: 104/80  Pulse: 114  Temp: 97.3 F (36.3 C)  Resp: 24    Intake/Output Summary (Last 24 hours) at 12/19/13 0932 Last data filed at 12/19/13 0725  Gross per 24 hour  Intake 1376.66 ml  Output   2500 ml  Net -1123.34 ml   Filed Weights   12/17/13 0700 12/18/13 0618 12/19/13 0546  Weight: 76.6 kg (168 lb 14 oz) 76.5 kg (168 lb 10.4 oz) 76.7  kg (169 lb 1.5 oz)    Exam:   General:  alert  Cardiovascular: s1,s2 rrr  Respiratory: few LL crackles   Abdomen: soft, nt, nd   Musculoskeletal: LE edema   Data Reviewed: Basic Metabolic Panel:  Recent Labs Lab 12/15/13 0405 12/16/13 0446 12/17/13 0540 12/18/13 0520 12/19/13 0430  NA 138 137 135* 139 136*  K 3.5* 3.5* 3.3* 2.9* 2.9*  CL 101 100 100 103 101  CO2 _0 GLUCOSE 123* 117* 114* 115* 131*  BUN _1 CREATININE 1.21* 1.15* 0.95 0.92 1.02  CALCIUM 9.2 8.8 8.6 8.7 8.6  MG  --   --   --  1.6 1.5   Liver Function Tests:  Recent Labs Lab 12/15/13 0405 12/16/13 0446 12/17/13 0540 12/18/13 0520 12/19/13 0430  AST _2 ALT _3 ALKPHOS 102 101 113 107 100  BILITOT 0.7 0.6 0.7 0.8 1.3*  PROT 7.5 7.1 6.4 6.5 6.3  ALBUMIN 2.6* 2.4* 2.3* 2.2* 2.1*    Recent Labs Lab 12/15/13 0405  LIPASE 57  AMYLASE 86   No results found for this basename: AMMONIA,  in the last 168 hours CBC:  Recent Labs Lab 12/15/13 0405 12/16/13 0446 12/17/13 0540 12/18/13 0520 12/19/13 0430  WBC 8.8 8.7 8.2 8.4 11.7*  NEUTROABS 5.9 5.6 5.2 5.1 8.1*  HGB 10.3* 9.2* 8.4* 8.2* 7.8*  HCT 30.3* 28.3* 24.9* 24.9* 23.3*  MCV 83.7 86.0 85.3 86.2 85.3  PLT 25* 22* 15* 17* 14*   Cardiac Enzymes: No results found for this basename: CKTOTAL, CKMB, CKMBINDEX, TROPONINI,  in the last 168 hours BNP (last 3 results)  Recent Labs  11/14/13 0435 11/17/13 0800 11/20/13 0515  PROBNP 2977.0* 9541.0* 3178.0*   CBG: No results found for this basename: GLUCAP,  in the last 168 hours  Recent Results (from the past 240 hour(s))  URINE CULTURE     Status: None   Collection Time    12/15/13 10:30 AM      Result Value Ref Range Status   Specimen Description URINE, CATHETERIZED   Final   Special Requests Immunocompromised   Final   Culture  Setup Time     Final   Value: 12/15/2013 13:04     Performed at [facility]      Final   Value: 6,000 COLONIES/ML     Performed at [facility]   Culture     Final   Value: VANCOMYCIN RESISTANT ENTEROCOCCUS ISOLATED     Note: CRITICAL RESULT CALLED TO, READ BACK BY AND VERIFIED WITH: KRISTEN [email or web address]  ON [id number] BY [facility]     Performed at [facility]   Report Status 12/19/2013 FINAL   Final   Organism ID, Bacteria VANCOMYCIN RESISTANT ENTEROCOCCUS ISOLATED   Final     Studies: No results found.  Scheduled Meds: . DULoxetine  60 mg Oral QPM  . fluticasone  2 spray Each Nare Daily  . hydrocortisone  10 mg Oral BID  . metoCLOPramide (REGLAN) injection  10 mg Intravenous TID AC & HS  . pantoprazole  40 mg Oral BID WC  . potassium chloride  10 mEq Intravenous Q1 Hr x 4  . potassium chloride  40 mEq Oral BID  . sodium chloride  10-40 mL Intracatheter Q12H  . sodium chloride  3 mL Intravenous Q12H  . triamcinolone cream   Topical TID   Continuous Infusions: . sodium chloride 50 mL/hr at 12/18/13 2221    Active Problems:   Myelodysplasia   Symptomatic anemia   Adrenal infarction   Acute renal failure   Acute on chronic diastolic heart failure   Altered mental status   Acute-on-chronic respiratory failure   Hypotension, unspecified   Severe sepsis   DVT (deep venous thrombosis)   Pleural effusion   Thrombocytopenia   Hypokalemia  Time spent: 25 minutes   Lacey [name], [facility] Pager [phone]. If 7PM-7AM, please contact night-coverage at [email or web address], password [facility] 12/19/2013, 9:32 AM  LOS: 42 days

## 2013-12-20 ENCOUNTER — Inpatient Hospital Stay (HOSPITAL_COMMUNITY): Payer: BC Managed Care – PPO

## 2013-12-20 DIAGNOSIS — A409 Streptococcal sepsis, unspecified: Secondary | ICD-10-CM

## 2013-12-20 LAB — COMPREHENSIVE METABOLIC PANEL
ALK PHOS: 102 U/L (ref 39–117)
ALT: 12 U/L (ref 0–35)
AST: 13 U/L (ref 0–37)
Albumin: 2.1 g/dL — ABNORMAL LOW (ref 3.5–5.2)
BILIRUBIN TOTAL: 1.4 mg/dL — AB (ref 0.3–1.2)
BUN: 7 mg/dL (ref 6–23)
CHLORIDE: 99 meq/L (ref 96–112)
CO2: 22 meq/L (ref 19–32)
Calcium: 8.6 mg/dL (ref 8.4–10.5)
Creatinine, Ser: 1.03 mg/dL (ref 0.50–1.10)
GFR calc Af Amer: 68 mL/min — ABNORMAL LOW (ref >90)
GFR, EST NON AFRICAN AMERICAN: 58 mL/min — AB (ref >90)
Glucose, Bld: 135 mg/dL — ABNORMAL HIGH (ref 70–99)
POTASSIUM: 3.3 meq/L — AB (ref 3.7–5.3)
SODIUM: 136 meq/L — AB (ref 137–147)
TOTAL PROTEIN: 6.6 g/dL (ref 6.0–8.3)

## 2013-12-20 LAB — CBC
HCT: 23.9 % — ABNORMAL LOW (ref 36.0–46.0)
Hemoglobin: 8 g/dL — ABNORMAL LOW (ref 12.0–15.0)
MCH: 28.4 pg (ref 26.0–34.0)
MCHC: 33.5 g/dL (ref 30.0–36.0)
MCV: 84.8 fL (ref 78.0–100.0)
Platelets: 20 10*3/uL — CL (ref 150–400)
RBC: 2.82 MIL/uL — AB (ref 3.87–5.11)
RDW: 16.3 % — ABNORMAL HIGH (ref 11.5–15.5)
WBC: 10.9 10*3/uL — ABNORMAL HIGH (ref 4.0–10.5)

## 2013-12-20 LAB — CBC WITH DIFFERENTIAL/PLATELET
BASOS ABS: 0.1 10*3/uL (ref 0.0–0.1)
Basophils Relative: 1 % (ref 0–1)
EOS ABS: 0.1 10*3/uL (ref 0.0–0.7)
Eosinophils Relative: 1 % (ref 0–5)
HEMATOCRIT: 22.9 % — AB (ref 36.0–46.0)
Hemoglobin: 7.6 g/dL — ABNORMAL LOW (ref 12.0–15.0)
LYMPHS PCT: 16 % (ref 12–46)
Lymphs Abs: 1.7 10*3/uL (ref 0.7–4.0)
MCH: 28.6 pg (ref 26.0–34.0)
MCHC: 33.2 g/dL (ref 30.0–36.0)
MCV: 86.1 fL (ref 78.0–100.0)
MONOS PCT: 7 % (ref 3–12)
Monocytes Absolute: 0.8 10*3/uL (ref 0.1–1.0)
NEUTROS ABS: 8.2 10*3/uL — AB (ref 1.7–7.7)
NEUTROS PCT: 75 % (ref 43–77)
PLATELETS: 18 10*3/uL — AB (ref 150–400)
RBC: 2.66 MIL/uL — ABNORMAL LOW (ref 3.87–5.11)
RDW: 16.6 % — ABNORMAL HIGH (ref 11.5–15.5)
WBC: 10.9 10*3/uL — AB (ref 4.0–10.5)

## 2013-12-20 LAB — TROPONIN I: Troponin I: <0.3 ng/mL (ref <0.30)

## 2013-12-20 LAB — PREPARE RBC (CROSSMATCH)

## 2013-12-20 LAB — RETICULOCYTES: RBC.: 2.66 MIL/uL — AB (ref 3.87–5.11)

## 2013-12-20 LAB — GLUCOSE, CAPILLARY
GLUCOSE-CAPILLARY: 153 mg/dL — AB (ref 70–99)
Glucose-Capillary: 88 mg/dL (ref 70–99)
Glucose-Capillary: 184 mg/dL — ABNORMAL HIGH (ref 70–99)
Glucose-Capillary: 209 mg/dL — ABNORMAL HIGH (ref 70–99)

## 2013-12-20 LAB — LACTIC ACID, PLASMA: Lactic Acid, Venous: 2 mmol/L (ref 0.5–2.2)

## 2013-12-20 LAB — PHOSPHORUS: Phosphorus: 2.1 mg/dL — ABNORMAL LOW (ref 2.3–4.6)

## 2013-12-20 MED ORDER — HYDROCORTISONE 20 MG PO TABS
30.0000 mg | ORAL_TABLET | Freq: Two times a day (BID) | ORAL | Status: DC
  Filled 2013-12-20 (×2): qty 1

## 2013-12-20 MED ORDER — ADULT MULTIVITAMIN W/MINERALS CH
1.0000 | ORAL_TABLET | Freq: Every day | ORAL | Status: DC
  Administered 2013-12-20 – 2013-12-22 (×3): 1 via ORAL
  Filled 2013-12-20 (×6): qty 1

## 2013-12-20 MED ORDER — SODIUM CHLORIDE 0.9 % IV BOLUS (SEPSIS): 1000.0000 mL | Freq: Once | INTRAVENOUS | Status: DC

## 2013-12-20 MED ORDER — INSULIN ASPART 100 UNIT/ML ~~LOC~~ SOLN
0.0000 [IU] | SUBCUTANEOUS | Status: DC
  Administered 2013-12-20: 3 [IU] via SUBCUTANEOUS
  Administered 2013-12-20 – 2013-12-21 (×2): 2 [IU] via SUBCUTANEOUS
  Administered 2013-12-21: 1 [IU] via SUBCUTANEOUS
  Administered 2013-12-21: 2 [IU] via SUBCUTANEOUS
  Administered 2013-12-21: 1 [IU] via SUBCUTANEOUS
  Administered 2013-12-21: 2 [IU] via SUBCUTANEOUS
  Administered 2013-12-22 (×2): 1 [IU] via SUBCUTANEOUS

## 2013-12-20 MED ORDER — FUROSEMIDE 10 MG/ML IJ SOLN
60.0000 mg | Freq: Once | INTRAMUSCULAR | Status: AC
  Administered 2013-12-20: 60 mg via INTRAVENOUS
  Filled 2013-12-20: qty 6

## 2013-12-20 MED ORDER — CLINIMIX E/DEXTROSE (5/15) 5 % IV SOLN
INTRAVENOUS | Status: AC
  Administered 2013-12-20: 18:00:00 via INTRAVENOUS
  Filled 2013-12-20: qty 1000

## 2013-12-20 MED ORDER — POTASSIUM CHLORIDE 10 MEQ/50ML IV SOLN
10.0000 meq | INTRAVENOUS | Status: AC
  Administered 2013-12-20 (×5): 10 meq via INTRAVENOUS
  Filled 2013-12-20 (×5): qty 50

## 2013-12-20 MED ORDER — CITALOPRAM HYDROBROMIDE 20 MG PO TABS
20.0000 mg | ORAL_TABLET | Freq: Every day | ORAL | Status: DC
  Administered 2013-12-20: 20 mg via ORAL
  Filled 2013-12-20 (×2): qty 1

## 2013-12-20 MED ORDER — HYDROCORTISONE NA SUCCINATE PF 100 MG IJ SOLR
50.0000 mg | Freq: Four times a day (QID) | INTRAMUSCULAR | Status: DC
  Administered 2013-12-20 – 2013-12-22 (×8): 50 mg via INTRAVENOUS
  Filled 2013-12-20 (×2): qty 1
  Filled 2013-12-20: qty 2
  Filled 2013-12-20 (×10): qty 1

## 2013-12-20 MED ORDER — SODIUM CHLORIDE 0.9 % IV SOLN
100.0000 mg | INTRAVENOUS | Status: DC
  Administered 2013-12-20 – 2013-12-21 (×2): 100 mg via INTRAVENOUS
  Filled 2013-12-20 (×3): qty 100

## 2013-12-20 MED ORDER — PANTOPRAZOLE SODIUM 40 MG IV SOLR
40.0000 mg | Freq: Two times a day (BID) | INTRAVENOUS | Status: DC
  Filled 2013-12-20 (×2): qty 40

## 2013-12-20 MED ORDER — PANTOPRAZOLE SODIUM 40 MG IV SOLR
40.0000 mg | Freq: Every day | INTRAVENOUS | Status: DC
  Administered 2013-12-21: 40 mg via INTRAVENOUS
  Filled 2013-12-20 (×2): qty 40

## 2013-12-20 MED ORDER — FAT EMULSION 20 % IV EMUL
250.0000 mL | INTRAVENOUS | Status: AC
  Administered 2013-12-20: 250 mL via INTRAVENOUS
  Filled 2013-12-20: qty 250

## 2013-12-20 MED ORDER — SODIUM CHLORIDE 0.9 % IV SOLN
INTRAVENOUS | Status: DC
  Administered 2013-12-22 – 2013-12-23 (×2): via INTRAVENOUS

## 2013-12-20 NOTE — Progress Notes (Signed)
PARENTERAL NUTRITION CONSULT NOTE - INITIAL  Pharmacy Consult for TNA Indication: Persistent vomiting, no oral intake x 7 days  Allergies  Allergen Reactions  . Penicillins Hives and Rash  . Contrast Media [Iodinated Diagnostic Agents]     Patient Measurements: Height: 5' (152.4 cm) Weight:  (Wt. refused by RN due to pt. condition.) IBW/kg (Calculated) : 45.5 Usual Weight: 88.6kg Weight = 76.7kg  Vital Signs: Temp: 99 F (37.2 C) (03/25 0633) Temp src: Axillary (03/25 0633) BP: 141/48 mmHg (03/25 1025) Pulse Rate: 129 (03/25 8527) Intake/Output from previous day: 03/24 0701 - 03/25 0700 In: 1436.7 [P.O.:60; I.V.:876.7; IV Piggyback:500] Out: 1100 [Urine:1100] Intake/Output from this shift:    Labs:  Recent Labs  12/18/13 0520 12/19/13 0430 12/20/13 0405  WBC 8.4 11.7* 10.9*  HGB 8.2* 7.8* 7.6*  HCT 24.9* 23.3* 22.9*  PLT 17* 14* 18*     Recent Labs  12/18/13 0520 12/19/13 0430 12/20/13 0405  NA 139 136* 136*  K 2.9* 2.9* 3.3*  CL 103 101 99  CO2 24 23 22   GLUCOSE 115* 131* 135*  BUN 6 6 7   CREATININE 0.92 1.02 1.03  CALCIUM 8.7 8.6 8.6  MG 1.6 1.5  --   PROT 6.5 6.3 6.6  ALBUMIN 2.2* 2.1* 2.1*  AST 13 14 13   ALT 13 13 12   ALKPHOS 107 100 102  BILITOT 0.8 1.3* 1.4*   Estimated Creatinine Clearance: 53.8 ml/min (by C-G formula based on Cr of 1.03).   No results found for this basename: GLUCAP,  in the last 72 hours  Medical History: Past Medical History  Diagnosis Date  . Cancer   . Cough productive of clear sputum 01/04/2013  . Fibromyalgia     Insulin Requirements in the past 24 hours:  0 units, no hx DM, no SSI/basal insulin ordered  Current Nutrition:  Clear liquid diet - no documented intake since 3/18 Ensure complete TID with meals - charted once 3/24  Nutritional Goals:  RD recs:  Kcal: 1300-1500, Protein: 55-70g, Fluid: 1.3-1.5L/day Clinimix E5/15 at a goal rate of 47ml/hr + IVFE 20% at 79ml/hr to provide: 78g/day protein,  1588Kcal/day avg  IVF: NS at 69ml/hr  Lines/Drains/Airways: PICC triple lumen left basilic (placed 04/05/23)  Assessment:  Glucose -at goal, < 150, on hydrocortisone BID for adrenal insufficiency  Electrolytes - Na 136, hypokalemia, improving with daily replacement, today K 3.3, other lytes ok  Renal: SCr stable, CrCl 54 ml/min  LFTs - WNL  TGs - Check 3/26  Prealbumin -Check 3/26  57 yoF with hx Hodgkin's lymphoma and MDS admitted since 2/10 with sepsis complicated with adrenal infarct, adrenal insuffiency, and HCAP.  Pt started treatment Heparin to lovenox for superficial thrombus in R cephalic vein and hypercoaguable state, however developed hemorrahge into her left iliopsoas muscle and has remained off anticoagulation since 2/24.  Pt completed 1 cycle of vidaza (2 doses held) 3/11 - 3/17 for MDS however has refractory anemia and thrombocytopenia requiring almost daily blood product transfusions.  Antibiotics restarted 3/24 for VRE urine infection.  Pt also with progressive rash s/p skin biopsy, nonspecific results.      3/25: Pharmacy consulted to start TPN for persistent vomiting and no oral intake since 3/18.  Spoke with Dr. Jonette Eva about possible trial of tube feeds, however he wishes to proceed with TPN tonight.  RD consult completed yesterday.   At 1800 today:  Start Clinimix E5/15 at 45ml/hr.  Fat emulsion at 59ml/hr  Plan to advance as tolerated to  the goal rate.  PO MVI daily    Reduce IVF to 20 ml/hr.  Add SSI sensitive q4h.   Hypokalemia - already replaced today by MD  TNA lab panels on Mondays & Thursdays.  F/u daily.  Ralene Bathe, PharmD, BCPS 12/20/2013, 8:37 AM  Pager: [phone]

## 2013-12-20 NOTE — Progress Notes (Addendum)
[facility] PROGRESS NOTE  Lacey [name] XBW:[id number] DOB: [birth year] DOA: 11/20/2013 PCP: Vena Austria, MD  Assessment/Plan  1. 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 -myelodysplastic/myeloproliferative process, particularly chronic myelomonocytic leukemia  - Appreciate Dr Jonette Eva assistance. on frequent transfusions (PRBC+platelets) as patient is developing antibodies. Per hem/onc, started Vidaza on 3/11, since stopped  -3/17: Plt down to 14. Watch closely for need for transfusion (if <10 or active bleeding).  -3/17: Hb down to 7.1.  -3/18 - Hb to 6.8 - 2 units PRBC's transfused  -3/25 - hgb 7.6 - 2 units PRBC ordered  2. Recurrent fever/septic shock, pneumonia Likely due to dysfunctional leukocytes as above  -CT chest on 2/23: Persistent consolidation RML with new peribronchovascular opacities suggestive of bronchopneumonia. Underwent bronch on 2/26; BCx NGTD, Diff PCR neg, BAL: PCP neg, Bacterial, fungal, and AFB cultures NGTD  - 3/6: febrile; CT: Persistent right upper lobe collapse/ consolidation with interval development of central airspace disease in the left upper lobe  - 3/6: restarted IV atx, antifungals, acyclovir  -off pressors; clinically improving, started taper IV steroids 3/10  -3/11: PICC line is out. Will stop all antibiotics and antifungals as all cx data has been negative to date. Hopefully fevers and "sepsis" will not recur.  -3/17: no further signs of sepsis since discontinuation of antibiotics.  - 3/24: Pt noted to be febrile this AM with elevated WBC of 11/7 (from 8.4 yesterday). Unclear source. UA normal. Blood cx ordered, pending.  -  Continue linezolid and primaxin day 2 -  ID consultation -  NS bolus -  Increase to stress dose steroids -  Repeat CXR    3. Acute on chronic diastolic heart failure due to IVF from resuscitation. Echo (2/15): Preserved EF and grade 1 diastolic heart failure  -R sided pleural effusion; anasarca, prn IV lasix while sick - Daily weights and strict I/O; monitor renal function  -Per pulmonology deferred thoracentesis due to thrombocytopenia, R sided pleural effusion   4. 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 Acute Renal Failure due to septic shock; RUS wnl. Hypervolemic  - resumed lasix due to respiratory status; close monitor  - renal fx currently normalized   6. Spontaneous retroperitoneal bleed in setting of thrombocytopenia; hemorrhage into her left iliopsoas muscle which caused weakness and pain of her left leg and LLQ  - on fentanyl; cont pain control  - Resume PT/OT  7. Right upper extremity superficial thrombosis, stable  8. Transaminitis due to septic shock, resolved.  9. Urge and stress incontinence; Foley placed for comfort  10. Tongue ulcer; cont as above; biopsied by ENT; path: SQUAMOUS EPITHELIUM. NO DYSPLASIA OR MALIGNANCY  -improving  11. Hypokalemia  -Repleted.  12. Progressive Rash  - Surgery consulted for skin biopsy  - Reactive findings - nonspecific findings   13. Nausea/vomiting  - Plain film xray without obstruction  - Stool output documented on 3/16 and again on 3/20  - Urine cx with 6,000 gm + cocci, pending speciation and sensitivities  - Urinalysis was unremarkable   Severe protein calorie malnutrition -  Appreciate nutrition assistance -  Started on TPN by Dr. Marin Olp today  Diet:  CLD Access:  port IVF:  yes Proph:  SCDs  Code Status: full Family Communication: patient and  POA cathy Disposition Plan: transfer to stepdown, ill-appearing   Consultants:  Dr. Burney Gauze (oncology)  Dr Gibson Ramp (Surgery) curbside consult  Dr. Carlyle Basques (infectious disease)   Procedures:  11/12/2013 right upper extremity Doppler  No evidence of deep vein thrombosis involving the right upper extremity, left subclavian vein, and left internal jugular vein. There is superficial thrombosis noted in the right cephalic vein.  Old PICC removed 3/5; Left-sided PICC 03/06 >>>  Foley 3/2 >>>    -2/12 Bone marrow biopsy report pending  Echocardiogram 01/10/2013  - Left ventricle: mild focal basal hypertrophy of the septum.  -LVEF= ejection fraction was 55%. Wall motion was normal; there were no regional wall motion abnormalities.  -(grade 1 diastolic dysfunction).  - Aortic valve: There was no stenosis. Trivial regurgitation. - Mitral valve: Trivial regurgitation. - Left atrium: The atrium was mildly dilated. - Right ventricle: The cavity size was normal. Systolic function was normal. - Tricuspid valve: Peak RV-RA gradient: 53m Hg (S). - Pulmonary arteries: PA peak pressure: 238mHg (S). - Inferior vena cava: The vessel was normal in size; the respirophasic diameter changes were in the normal range (=50%); findings are consistent with normal central venous pressure.  Antibiotics:  None at present  2/15 Upper extremity Dopplers demonstrated acute clot of the right cephalic vein  2/7/50hest CT demonstrated loculated right pleural effusion which does not appear to have intact  2/17 Echocardiogram demonstrates EF of 5551-83%ith diastolic dysfunction.  2/24 Abdominal CT demonstrated hemorrhage in the left iliacus muscle  2/23 Chest CT demonstrated persistent right pleural effusion and right middle lobe density  2/25 Head CT demonstrated sphenoid sinusitis  2/26 Bronchoscopy did not reveal an infectious organism  3/3 Lower extremity Dopplers did not detect DVTs  3/6 Chest CT> persistent collapse of the RML, RLL with a large pleural effusion. Development of a new left airspace opacity.  3/8 TTE >>> Ef 65%, no RWMA, PAP 35 torr  3/8 Abdomen CT >>> No findings consistent  with sepsis, evolution of L iliac hematoma  3/8 [place] Effusion>>>small in volume, loculated & complex appearing by [place] THORA  HPI/Subjective:  Patient states that she feels SOB and ill today.  Persistent nausea.  Having BMs.  Foley in place. Rash improved  Objective: Filed Vitals:   12/19/13 2104 12/20/13 0258 12/20/13 0633 12/20/13 0801  BP: 106/47 132/59 141/48   Pulse: 125 122 129   Temp: 98.4 F (36.9 C) 98.3 F (36.8 C) 99 F (37.2 C)   TempSrc: Oral Oral Axillary   Resp: _0 Height:      Weight:    75.8 kg (167 lb 1.7 oz)  SpO2: 99% 92% 92%     Intake/Output Summary (Last 24 hours) at 12/20/13 0949 Last data filed at 12/20/13 0900  Gross per 24 hour  Intake 1436.66 ml  Output    900 ml  Net 536.66 ml   Filed Weights   12/18/13 0618 12/19/13 0546 12/20/13 0801  Weight: 76.5 kg (168 lb 10.4 oz) 76.7 kg (169 lb 1.5 oz) 75.8 kg (167 lb 1.7 oz)    Exam:   General:  Very pale CF, No acute distress  HEENT:  NCAT, MMM  Cardiovascular:  Tachycardia, RR, nl S1, S2 no mrg, 2+ pulses, warm extremities  Respiratory:  rhonchorous BS, diminished at bilateral bases, no wheeze, no increased WOB  Abdomen:   NABS, soft, ND, mild TTP LLE  MSK:   Normal tone and bulk,  trace LEE  Neuro:  Weakness LLE  Data Reviewed: Basic Metabolic Panel:  Recent Labs Lab 12/16/13 0446 12/17/13 0540 12/18/13 0520 12/19/13 0430 12/20/13 0405  NA 137 135* 139 136* 136*  K 3.5* 3.3* 2.9* 2.9* 3.3*  CL 100 100 103 101 99  CO2 _0 GLUCOSE 117* 114* 115* 131* 135*  BUN _1 CREATININE 1.15* 0.95 0.92 1.02 1.03  CALCIUM 8.8 8.6 8.7 8.6 8.6  MG  --   --  1.6 1.5  --    Liver Function Tests:  Recent Labs Lab 12/16/13 0446 12/17/13 0540 12/18/13 0520 12/19/13 0430 12/20/13 0405  AST _2 ALT _3 ALKPHOS 101 113 107 100 102  BILITOT 0.6 0.7 0.8 1.3* 1.4*  PROT 7.1 6.4 6.5 6.3 6.6  ALBUMIN 2.4* 2.3* 2.2* 2.1* 2.1*     Recent Labs Lab 12/15/13 0405  LIPASE 57  AMYLASE 86   No results found for this basename: AMMONIA,  in the last 168 hours CBC:  Recent Labs Lab 12/16/13 0446 12/17/13 0540 12/18/13 0520 12/19/13 0430 12/20/13 0405  WBC 8.7 8.2 8.4 11.7* 10.9*  NEUTROABS 5.6 5.2 5.1 8.1* 8.2*  HGB 9.2* 8.4* 8.2* 7.8* 7.6*  HCT 28.3* 24.9* 24.9* 23.3* 22.9*  MCV 86.0 85.3 86.2 85.3 86.1  PLT 22* 15* 17* 14* 18*   Cardiac Enzymes: No results found for this basename: CKTOTAL, CKMB, CKMBINDEX, TROPONINI,  in the last 168 hours BNP (last 3 results)  Recent Labs  11/14/13 0435 11/17/13 0800 11/20/13 0515  PROBNP 2977.0* 9541.0* 3178.0*   CBG: No results found for this basename: GLUCAP,  in the last 168 hours  Recent Results (from the past 240 hour(s))  URINE CULTURE     Status: None   Collection Time    12/15/13 10:30 AM      Result Value Ref Range Status   Specimen Description URINE, CATHETERIZED   Final   Special Requests Immunocompromised   Final   Culture  Setup Time     Final   Value: 12/15/2013 13:04     Performed at [facility]     Final   Value: 6,000 COLONIES/ML     Performed at [facility]   Culture     Final   Value: VANCOMYCIN RESISTANT ENTEROCOCCUS ISOLATED     Note: CRITICAL RESULT CALLED TO, READ BACK BY AND VERIFIED WITH: [email or web address] [email or web address]  ON [id number] BY [facility]     Performed at [facility]   Report Status 12/19/2013 FINAL   Final   Organism ID, Bacteria VANCOMYCIN RESISTANT ENTEROCOCCUS ISOLATED   Final  CULTURE, BLOOD (ROUTINE X 2)     Status: None   Collection Time    12/19/13  8:10 AM      Result Value Ref Range Status   Specimen Description BLOOD RIGHT ARM   Final   Special Requests BOTTLES DRAWN AEROBIC ONLY 1 CC   Final   Culture  Setup Time     Final   Value: 12/19/2013 12:14     Performed at [facility]   Culture     Final   Value:        BLOOD CULTURE RECEIVED NO GROWTH TO DATE CULTURE WILL  BE HELD FOR 5 DAYS BEFORE ISSUING A FINAL NEGATIVE REPORT     Performed at [facility]   Report Status  PENDING   Incomplete  CULTURE, BLOOD (ROUTINE X 2)     Status: None   Collection Time    12/19/13  8:20 AM      Result Value Ref Range Status   Specimen Description BLOOD RIGHT ARM   Final   Special Requests BOTTLES DRAWN AEROBIC AND ANAEROBIC 2 CC EACH   Final   Culture  Setup Time     Final   Value: 12/19/2013 12:14     Performed at [facility]   Culture     Final   Value:        BLOOD CULTURE RECEIVED NO GROWTH TO DATE CULTURE WILL BE HELD FOR 5 DAYS BEFORE ISSUING A FINAL NEGATIVE REPORT     Performed at [facility]   Report Status PENDING   Incomplete     Studies: Dg Chest Port 1 View  12/19/2013   CLINICAL DATA:  Fever and cough .  EXAM: PORTABLE CHEST - 1 VIEW  COMPARISON:  DG CHEST 2 VIEW dated 12/14/2013; DG CHEST 2 VIEW dated 12/11/2013; CT CHEST W/O CM dated 12/01/2013  FINDINGS: Mediastinum stable. Interim partial clearing of right lower lobe atelectasis. Right perihilar atelectasis with possible underlying mass noted. Minimal blunting of the costophrenic angles noted. Small pleural effusions cannot be excluded. Heart size normal. PICC line noted with tip projected at the cavoatrial junction. Prior cervical spine fusion.  IMPRESSION: 1. Right perihilar/upper lobe atelectatic changes. Underlying right hilar mass cannot be excluded. 2. Interim partial clearing of right lower lobe atelectasis. 3. Tiny pleural effusions. 4. PICC line in good anatomic position.   Electronically Signed   By: Marcello Moores  Register   On: 12/19/2013 12:13    Scheduled Meds: . citalopram  20 mg Oral Daily  . DULoxetine  60 mg Oral QPM  . feeding supplement (ENSURE COMPLETE)  237 mL Oral TID WC  . fluticasone  2 spray Each Nare Daily  . furosemide  60 mg Intravenous Once  . hydrocortisone sod succinate (SOLU-CORTEF) inj  50 mg Intravenous Q6H  . imipenem-cilastatin  500 mg  Intravenous 3 times per day  . insulin aspart  0-9 Units Subcutaneous 6 times per day  . linezolid  600 mg Intravenous Q12H  . metoCLOPramide (REGLAN) injection  10 mg Intravenous TID AC & HS  . multivitamin with minerals  1 tablet Oral Daily  . pantoprazole (PROTONIX) IV  40 mg Intravenous BID  . potassium chloride  10 mEq Intravenous Q1 Hr x 5  . sodium chloride  1,000 mL Intravenous Once  . sodium chloride  10-40 mL Intracatheter Q12H  . sodium chloride  3 mL Intravenous Q12H  . triamcinolone cream   Topical TID   Continuous Infusions: . sodium chloride 50 mL/hr at 12/20/13 0303  . sodium chloride    . Marland KitchenTPN (CLINIMIX-E) Adult     And  . fat emulsion      Active Problems:   Myelodysplasia   Symptomatic anemia   Adrenal infarction   Acute renal failure   Acute on chronic diastolic heart failure   Altered mental status   Acute-on-chronic respiratory failure   Hypotension, unspecified   Severe sepsis   DVT (deep venous thrombosis)   Pleural effusion   Thrombocytopenia   Hypokalemia    Time spent: 30 min    Bethaney Oshana, [facility] Pager [phone]. If 7PM-7AM, please contact night-coverage at [email or web address], password [facility] 12/20/2013, 9:49 AM  LOS: 43 days

## 2013-12-20 NOTE — Progress Notes (Signed)
Nutrition Brief Note  Estimated Nutritional Needs:  Kcal: 1300-1500  Protein: 55-70g  Fluid: 1.3-1.5L/day  Assessment: -Pt evaluated by RD yesterday (3/24) -Pharmacy paged RD concerning TPN initiation. -Nausea/vomiting likely related to chemo treatments, pt more appropriate candidate for trickle tube feeds vs TPN -Pharmacy discussed tube feeding alternative for nutrition support; however, MD wishes to continue with TPN  -Plan to start Clinimix 5/15 at 30 mlhr and advance as tolerated to goal rate  Intervention: -Clinimix E5/15 at a goal rate of 60ml/hr + IVFE 20% at 86ml/hr to provide: 78g/day protein, 1588Kcal/day avg  -Diet advancement per MD   Atlee Abide MS RD LDN Clinical Dietitian [phone]

## 2013-12-20 NOTE — Progress Notes (Signed)
INFECTIOUS DISEASE PROGRESS NOTE  ID: Lacey [name] is a 58 y.o. 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 She has been hospitalized since 11/06/2013 initially with pneumonia, also found to have R iliopsoas/iliacus hemorrhage, acute renal failure, as well as adrenal infarct (for which she was started on steroids). She underwent bronch on 2-26 (Cx -).  She was last seen by ID (for fever and leukocytosis) on 2-28 with the plan to stop her anbx in 3 days (vanco/cefepime).  By 3-6 she again developed fever to 103. She was restarted on vanco/cefepime. Her PIC was removed. She became tachypneic around this time and also was found on CT to have persistent collapse of RML and RLL, new L airspace opacity. Her anbx were changed to acyclovir/zyvox/micafungin/zosyn. She required pressors til 3-8.  On 3-10 she was started on therapy for her MDS (Vidaza).  She was able to be transferred out of ICU 3-10/11. Her anbx were stopped then.  She developed a diffuse rash around 3-18, she underwent skin bx 3-19 (non-specific).   By 3-24 she again developed fever (101.2).  She was started on imipenem/zyvox. She was transferred back to ICU on 3-25.   Her course has been complicated by multiple blood and platelet transfusions.   Her course was also notable for development of a R lateral tongue ulcer which did not respond to ACV (Bx 3-6: -). Lastly her course was complicated by the sudden death of her husband while he was out of town on 3-18.   Currently she complains of fever, denies pain. Had normal BM today.    Abtx:  Anti-infectives   Start     Dose/Rate Route Frequency Ordered Stop   12/19/13 1200  imipenem-cilastatin (PRIMAXIN) 500 mg in sodium chloride 0.9 % 100 mL IVPB     500 mg 200 mL/hr over 30 Minutes Intravenous 3 times per day 12/19/13 1025     12/19/13 1030  linezolid (ZYVOX) IVPB 600 mg     600 mg 300 mL/hr over 60 Minutes Intravenous Every 12 hours 12/19/13 1019     12/02/13 2230  ceFEPIme (MAXIPIME) 1 g in dextrose 5 % 50 mL IVPB  Status:  Discontinued     1 g 100 mL/hr over 30 Minutes Intravenous Every 12 hours 12/02/13 2223 12/06/13 1724   12/02/13 2200  linezolid (ZYVOX) IVPB 600 mg  Status:  Discontinued     600 mg 300 mL/hr over 60 Minutes Intravenous Every 12 hours 12/02/13 2145 12/06/13 1724   12/02/13 2200  micafungin (MYCAMINE) 100 mg in sodium chloride 0.9 % 100 mL IVPB  Status:  Discontinued     100 mg 100 mL/hr over 1 Hours Intravenous Every 24 hours 12/02/13 2145 12/06/13 1724   12/02/13 1200  vancomycin (VANCOCIN) 1,250 mg in sodium chloride 0.9 % 250 mL IVPB  Status:  Discontinued     1,250 mg 166.7 mL/hr over 90 Minutes Intravenous Every 24 hours 12/01/13 0802 12/02/13 2115   12/01/13 2200  ceFEPIme (MAXIPIME) 1 g in dextrose 5 % 50 mL IVPB  Status:  Discontinued  1 g 100 mL/hr over 30 Minutes Intravenous Every 12 hours 12/01/13 2116 12/02/13 2115   12/01/13 0915  ceFEPIme (MAXIPIME) 2 g in dextrose 5 % 50 mL IVPB     2 g 100 mL/hr over 30 Minutes Intravenous STAT 12/01/13 0904 12/01/13 1111   12/01/13 0800  vancomycin (VANCOCIN) 2,000 mg in sodium chloride 0.9 % 500 mL IVPB     2,000 mg 250 mL/hr over 120 Minutes Intravenous  Once 12/01/13 0745 12/01/13 1241   11/30/13 1000  fluconazole (DIFLUCAN) IVPB 200 mg  Status:  Discontinued     200 mg 100 mL/hr over 60 Minutes Intravenous Every 24 hours 11/30/13 0752 12/02/13 2115   11/30/13 0900  acyclovir (ZOVIRAX) 230 mg in dextrose 5 % 100 mL IVPB  Status:  Discontinued     5 mg/kg  45.5 kg (Ideal) 104.6  mL/hr over 60 Minutes Intravenous Every 8 hours 11/30/13 0752 12/06/13 1724   11/25/13 1800  ceFEPIme (MAXIPIME) 2 g in dextrose 5 % 50 mL IVPB  Status:  Discontinued     2 g 100 mL/hr over 30 Minutes Intravenous Every 24 hours 11/25/13 1243 11/27/13 0750   11/24/13 0800  vancomycin (VANCOCIN) 1,250 mg in sodium chloride 0.9 % 250 mL IVPB  Status:  Discontinued     1,250 mg 166.7 mL/hr over 90 Minutes Intravenous Every 24 hours 11/24/13 0706 11/27/13 0740   10/30/2013 1800  ceFEPIme (MAXIPIME) 1 g in dextrose 5 % 50 mL IVPB  Status:  Discontinued     1 g 100 mL/hr over 30 Minutes Intravenous Every 24 hours 11/22/13 1944 11/25/13 1243   11/08/2013 1000  voriconazole (VFEND) tablet 200 mg  Status:  Discontinued     200 mg Oral Every 12 hours 11/11/2013 0842 11/25/13 1214   11/20/2013 0800  vancomycin (VANCOCIN) 1,250 mg in sodium chloride 0.9 % 250 mL IVPB     1,250 mg 166.7 mL/hr over 90 Minutes Intravenous  Once 11/04/2013 0618 11/05/2013 0930   11/22/13 2000  vancomycin (VANCOCIN) 1,250 mg in sodium chloride 0.9 % 250 mL IVPB  Status:  Discontinued     1,250 mg 166.7 mL/hr over 90 Minutes Intravenous Every 24 hours 11/22/13 1720 11/22/13 1926   11/22/13 1800  ceFEPIme (MAXIPIME) 2 g in dextrose 5 % 50 mL IVPB  Status:  Discontinued     2 g 100 mL/hr over 30 Minutes Intravenous Every 24 hours 11/22/13 1720 11/22/13 1944   11/22/13 1000  acyclovir (ZOVIRAX) 200 MG capsule 800 mg  Status:  Discontinued     800 mg Oral 3 times daily 11/22/13 0714 11/22/13 0727   11/22/13 1000  acyclovir (ZOVIRAX) tablet 800 mg  Status:  Discontinued     800 mg Oral 3 times daily 11/22/13 0727 11/22/13 1942   11/18/13 1000  acyclovir (ZOVIRAX) 200 MG capsule 400 mg  Status:  Discontinued     400 mg Oral 3 times daily 11/18/13 0734 11/19/13 0804   11/18/13 1000  fluconazole (DIFLUCAN) tablet 100 mg  Status:  Discontinued     100 mg Oral Daily 11/18/13 0734 11/09/2013 0807   11/16/13 1200  imipenem-cilastatin (PRIMAXIN) 500  mg in sodium chloride 0.9 % 100 mL IVPB  Status:  Discontinued     500 mg 200 mL/hr over 30 Minutes Intravenous Every 8 hours 11/16/13 0731 11/18/13 0734   11/15/13 1000  vancomycin (VANCOCIN) 1,250 mg in sodium chloride 0.9 % 250 mL IVPB     1,250 mg 166.7 mL/hr  over 90 Minutes Intravenous Every 24 hours 11/14/13 0852 11/21/13 1125   11/14/13 1600  imipenem-cilastatin (PRIMAXIN) 250 mg in sodium chloride 0.9 % 100 mL IVPB  Status:  Discontinued     250 mg 200 mL/hr over 30 Minutes Intravenous 4 times per day 11/14/13 0856 11/16/13 0731   11/14/13 1000  voriconazole (VFEND) 360 mg in sodium chloride 0.9 % 150 mL IVPB  Status:  Discontinued     4 mg/kg  89.3 kg 93 mL/hr over 120 Minutes Intravenous Every 12 hours 11/13/13 0806 11/15/13 0807   11/14/13 1000  acyclovir (ZOVIRAX) 230 mg in dextrose 5 % 100 mL IVPB  Status:  Discontinued     5 mg/kg  45.5 kg (Ideal) 104.6 mL/hr over 60 Minutes Intravenous Every 12 hours 11/14/13 0840 11/17/13 0714   11/14/13 0900  vancomycin (VANCOCIN) 2,000 mg in sodium chloride 0.9 % 500 mL IVPB     2,000 mg 250 mL/hr over 120 Minutes Intravenous  Once 11/14/13 0851 11/14/13 1456   11/14/13 0745  acyclovir (ZOVIRAX) 485 mg in dextrose 5 % 100 mL IVPB  Status:  Discontinued     5 mg/kg  96.6 kg 109.7 mL/hr over 60 Minutes Intravenous 3 times per day 11/14/13 0742 11/14/13 0839   11/13/13 1600  imipenem-cilastatin (PRIMAXIN) 500 mg in sodium chloride 0.9 % 100 mL IVPB  Status:  Discontinued     500 mg 200 mL/hr over 30 Minutes Intravenous Every 8 hours 11/13/13 0752 11/14/13 0856   11/13/13 0900  voriconazole (VFEND) 540 mg in sodium chloride 0.9 % 150 mL IVPB     6 mg/kg  89.3 kg 102 mL/hr over 120 Minutes Intravenous Every 12 hours 11/13/13 0806 11/14/13 0110   11/13/13 0830  imipenem-cilastatin (PRIMAXIN) 500 mg in sodium chloride 0.9 % 100 mL IVPB     500 mg 200 mL/hr over 30 Minutes Intravenous STAT 11/13/13 0750 11/13/13 1127   11/13/13 0730   ceFEPIme (MAXIPIME) 1 g in dextrose 5 % 50 mL IVPB  Status:  Discontinued     1 g 100 mL/hr over 30 Minutes Intravenous 3 times per day 11/13/13 0718 11/13/13 0750   11/10/13 2000  vancomycin (VANCOCIN) 500 mg in sodium chloride 0.9 % 100 mL IVPB  Status:  Discontinued     500 mg 100 mL/hr over 60 Minutes Intravenous Every 12 hours 11/10/13 1824 11/12/13 1500   11/08/13 1800  vancomycin (VANCOCIN) IVPB 750 mg/150 ml premix  Status:  Discontinued     750 mg 150 mL/hr over 60 Minutes Intravenous Every 12 hours 11/08/13 0307 11/10/13 1824   11/08/13 1000  ciprofloxacin (CIPRO) IVPB 400 mg  Status:  Discontinued     400 mg 200 mL/hr over 60 Minutes Intravenous Every 12 hours 11/15/2013 2325 11/11/13 0812   11/08/13 0600  aztreonam (AZACTAM) 1 g in dextrose 5 % 50 mL IVPB  Status:  Discontinued     1 g 100 mL/hr over 30 Minutes Intravenous 3 times per day 11/08/13 0307 11/10/13 1356   11/08/13 0030  vancomycin (VANCOCIN) IVPB 1000 mg/200 mL premix     1,000 mg 200 mL/hr over 60 Minutes Intravenous  Once 11/08/13 0020 11/08/13 0354   11/08/13 0000  aztreonam (AZACTAM) 2 g in dextrose 5 % 50 mL IVPB     2 g 100 mL/hr over 30 Minutes Intravenous  Once 11/19/2013 2357 11/08/13 0151   11/01/2013 2130  ciprofloxacin (CIPRO) IVPB 400 mg     400 mg 200 mL/hr  over 60 Minutes Intravenous  Once 11-17-2013 2111 17-Nov-2013 2325      Medications:  Scheduled: . citalopram  20 mg Oral Daily  . DULoxetine  60 mg Oral QPM  . feeding supplement (ENSURE COMPLETE)  237 mL Oral TID WC  . fluticasone  2 spray Each Nare Daily  . furosemide  60 mg Intravenous Once  . hydrocortisone sod succinate (SOLU-CORTEF) inj  50 mg Intravenous Q6H  . imipenem-cilastatin  500 mg Intravenous 3 times per day  . insulin aspart  0-9 Units Subcutaneous 6 times per day  . linezolid  600 mg Intravenous Q12H  . metoCLOPramide (REGLAN) injection  10 mg Intravenous TID AC & HS  . multivitamin with minerals  1 tablet Oral Daily  . [START  ON 12/21/2013] pantoprazole (PROTONIX) IV  40 mg Intravenous Daily  . sodium chloride  10-40 mL Intracatheter Q12H  . sodium chloride  3 mL Intravenous Q12H  . triamcinolone cream   Topical TID    Objective: Vital signs in last 24 hours: Temp:  [98.3 F (36.8 C)-99.5 F (37.5 C)] 99.5 F (37.5 C) (03/25 1200) Pulse Rate:  [122-141] 140 (03/25 1500) Resp:  [24-38] 38 (03/25 1500) BP: (80-141)/(40-74) 96/47 mmHg (03/25 1500) SpO2:  [92 %-100 %] 94 % (03/25 1500) Weight:  [75.8 kg (167 lb 1.7 oz)-77.4 kg (170 lb 10.2 oz)] 77.4 kg (170 lb 10.2 oz) (03/25 1200)   General appearance: alert, cooperative and moderate distress Eyes: negative findings: conjunctivae and sclerae normal and pupils equal, round, reactive to light and accomodation, no photophobia Throat: abnormal findings: no thrush, ulcer on tongue on R.  Neck: no adenopathy and supple, symmetrical, trachea midline Resp: diminished breath sounds bilaterally Cardio: tachycardia GI: normal findings: bowel sounds normal and soft, non-tender and abnormal findings:  distended Extremities: edema UE Neurologic: Grossly normal  Lab Results  Recent Labs  12/19/13 0430 12/20/13 0405 12/20/13 1115  WBC 11.7* 10.9* 10.9*  HGB 7.8* 7.6* 8.0*  HCT 23.3* 22.9* 23.9*  NA 136* 136*  --   K 2.9* 3.3*  --   CL 101 99  --   CO2 23 22  --   BUN 6 7  --   CREATININE 1.02 1.03  --    Liver Panel  Recent Labs  12/19/13 0430 12/20/13 0405  PROT 6.3 6.6  ALBUMIN 2.1* 2.1*  AST 14 13  ALT 13 12  ALKPHOS 100 102  BILITOT 1.3* 1.4*   Sedimentation Rate No results found for this basename: ESRSEDRATE,  in the last 72 hours C-Reactive Protein No results found for this basename: CRP,  in the last 72 hours  Microbiology: Recent Results (from the past 240 hour(s))  URINE CULTURE     Status: None   Collection Time    12/15/13 10:30 AM      Result Value Ref Range Status   Specimen Description URINE, CATHETERIZED   Final   Special  Requests Immunocompromised   Final   Culture  Setup Time     Final   Value: 12/15/2013 13:04     Performed at [place]     Final   Value: 6,000 COLONIES/ML     Performed at [facility]   Culture     Final   Value: VANCOMYCIN RESISTANT ENTEROCOCCUS ISOLATED     Note: CRITICAL RESULT CALLED TO, READ BACK BY AND VERIFIED WITH: KRISTEN [email or web address]  ON [id number] BY [facility]     Performed at [facility]   Report Status 12/19/2013 FINAL   Final   Organism ID, Bacteria VANCOMYCIN RESISTANT ENTEROCOCCUS ISOLATED   Final  CULTURE, BLOOD (ROUTINE X 2)     Status: None   Collection Time    12/19/13  8:10 AM      Result Value Ref Range Status   Specimen Description BLOOD RIGHT ARM   Final   Special Requests BOTTLES DRAWN AEROBIC ONLY 1 CC   Final   Culture  Setup Time     Final   Value: 12/19/2013 12:14     Performed at [facility]   Culture     Final   Value:        BLOOD CULTURE RECEIVED NO GROWTH TO DATE CULTURE WILL BE HELD FOR 5 DAYS BEFORE ISSUING A FINAL NEGATIVE REPORT     Performed at [facility]   Report Status PENDING   Incomplete  CULTURE, BLOOD (ROUTINE X 2)     Status: None   Collection Time    12/19/13  8:20 AM      Result Value Ref Range Status   Specimen Description BLOOD RIGHT ARM   Final   Special Requests BOTTLES DRAWN AEROBIC AND ANAEROBIC 2 CC EACH   Final   Culture  Setup Time     Final   Value: 12/19/2013 12:14     Performed at [facility]   Culture     Final   Value:        BLOOD CULTURE RECEIVED NO GROWTH TO DATE CULTURE WILL BE HELD FOR 5 DAYS BEFORE ISSUING A FINAL NEGATIVE REPORT     Performed at [facility]   Report Status PENDING   Incomplete    Studies/Results: Dg Chest Port 1 View  12/20/2013   CLINICAL DATA:  Increasing shortness of breath.  EXAM: PORTABLE CHEST - 1 VIEW  COMPARISON:  DG CHEST 1V PORT dated 12/19/2013; DG CHEST 2 VIEW dated 12/14/2013  FINDINGS: Low lung volumes.  Increased cardiomediastinal silhouette. Continued fluid in the minor fissure with small bilateral pleural effusions blunting the costophrenic angles. PICC line tip cavoatrial junction.  IMPRESSION: Slight worsening aeration with low lung volumes. No active infiltrates or failure.   Electronically Signed   By: Rolla Flatten M.D.   On: 12/20/2013 11:06   Dg Chest Port 1 View  12/19/2013   CLINICAL DATA:  Fever and cough .  EXAM: PORTABLE CHEST - 1 VIEW  COMPARISON:  DG CHEST 2 VIEW dated 12/14/2013; DG CHEST 2 VIEW dated 12/11/2013; CT CHEST W/O CM dated 12/01/2013  FINDINGS: Mediastinum stable. Interim partial clearing of right lower lobe atelectasis. Right perihilar atelectasis with possible underlying mass noted. Minimal blunting of the costophrenic angles noted. Small pleural effusions cannot be excluded. Heart size normal. PICC line noted with tip projected at the cavoatrial junction. Prior cervical spine fusion.  IMPRESSION: 1. Right perihilar/upper lobe atelectatic changes. Underlying right hilar mass cannot be excluded. 2. Interim partial clearing of right lower lobe atelectasis. 3. Tiny pleural effusions. 4. PICC line in good anatomic position.   Electronically Signed   By: Marcello Moores  Register   On: 12/19/2013 12:13     Assessment/Plan: MDS Hodgkin's lymphoma Fever Thrombocytopenia UCx VRE  Total days of antibiotics: 2 currently (imipenem/zyvox)  Would add micafungin. Check CMV PCR on blood, consider valcyte Check amylase, lipase Consider CT chest/abd/pelvis Await BCx Use zyvox with caution in this pt with MDS. It is marrow toxic and will worsen her  thrombocytopenia Query if this could be transfusion reactions? Recurrence of lymphoma?          Bobby Rumpf Infectious Diseases (pager) [phone] [email or web address] 12/20/2013, 3:42 PM  LOS: 43 days

## 2013-12-20 NOTE — Progress Notes (Signed)
Ms. Kakos does not look all that great today. She has chills. She just looks like she is becoming "septic" again. She does have enterococcus in her urine. He was vancomycin resistant. She's been started on antibiotics with Primaxin and Zyvox.  She's still having vomiting. I am not too sure. as to why this is happening. I started her on Reglan.  She has more tongue ulcers.  The rash on her upper arms looks about the same. There may be some rash on her legs now.  I just have a bad feeling that she is going to end up back in the ICU.  She had a chest x-ray yesterday. It did not show any obvious infiltrates.  I will give her 2 units of blood today. Her hemoglobin is 7.6. Her blood pressure is okay for right now.  There may be some more disorientation.  She tried some physical therapy yesterday. It did not look like she could do all that much.  Her vital signs show for temperature to be 98.2. Blood pressure 141/48. Pulses 129. Oxygen saturation 92%.  Or oral exam does show the ulcerations on her tongue. Lungs show distant breath sounds bilaterally. Cardiac exam is tachycardic but regular. Abdomen soft. Bowel sounds might be a little bit more active. There is no tenderness. Extremities shows the rash on her arms. It might be drying up a little bit. Neurological exam shows some slight lethargy.  Again, it looks like she is becoming septic again. It might be this enterococcus in her urine. We will have to see what her blood cultures show.  I will start her on some Celexa. This might be something that could help her deal with her husbands untimely passing.  I will start her on some TNA as she is just not eating much  right now.    I spoke to her power of attorney, [name],.  [place] 84:12

## 2013-12-21 DIAGNOSIS — B952 Enterococcus as the cause of diseases classified elsewhere: Secondary | ICD-10-CM

## 2013-12-21 DIAGNOSIS — N390 Urinary tract infection, site not specified: Secondary | ICD-10-CM

## 2013-12-21 DIAGNOSIS — Z1639 Resistance to other specified antimicrobial drug: Secondary | ICD-10-CM

## 2013-12-21 LAB — CBC WITH DIFFERENTIAL/PLATELET
BASOS PCT: 2 % — AB (ref 0–1)
Basophils Absolute: 0.2 10*3/uL — ABNORMAL HIGH (ref 0.0–0.1)
EOS PCT: 0 % (ref 0–5)
Eosinophils Absolute: 0 10*3/uL (ref 0.0–0.7)
HCT: 30.5 % — ABNORMAL LOW (ref 36.0–46.0)
Hemoglobin: 10.1 g/dL — ABNORMAL LOW (ref 12.0–15.0)
Lymphocytes Relative: 11 % — ABNORMAL LOW (ref 12–46)
Lymphs Abs: 1.4 10*3/uL (ref 0.7–4.0)
MCH: 28.1 pg (ref 26.0–34.0)
MCHC: 33.1 g/dL (ref 30.0–36.0)
MCV: 84.7 fL (ref 78.0–100.0)
MONO ABS: 0.9 10*3/uL (ref 0.1–1.0)
Monocytes Relative: 7 % (ref 3–12)
Neutro Abs: 9.8 10*3/uL — ABNORMAL HIGH (ref 1.7–7.7)
Neutrophils Relative %: 80 % — ABNORMAL HIGH (ref 43–77)
Platelets: 16 10*3/uL — CL (ref 150–400)
RBC: 3.6 MIL/uL — AB (ref 3.87–5.11)
RDW: 15.7 % — ABNORMAL HIGH (ref 11.5–15.5)
WBC: 12.3 10*3/uL — ABNORMAL HIGH (ref 4.0–10.5)

## 2013-12-21 LAB — TYPE AND SCREEN
ABO/RH(D): O POS
Antibody Screen: POSITIVE
DAT, IGG: NEGATIVE
DONOR AG TYPE: NEGATIVE
Donor AG Type: NEGATIVE
Unit division: 0
Unit division: 0

## 2013-12-21 LAB — FUNGUS CULTURE W SMEAR: Fungal Smear: NONE SEEN

## 2013-12-21 LAB — COMPREHENSIVE METABOLIC PANEL
ALBUMIN: 2.1 g/dL — AB (ref 3.5–5.2)
ALT: 10 U/L (ref 0–35)
AST: 10 U/L (ref 0–37)
Alkaline Phosphatase: 103 U/L (ref 39–117)
BILIRUBIN TOTAL: 1.6 mg/dL — AB (ref 0.3–1.2)
BUN: 14 mg/dL (ref 6–23)
CHLORIDE: 100 meq/L (ref 96–112)
CO2: 22 mEq/L (ref 19–32)
CREATININE: 1.2 mg/dL — AB (ref 0.50–1.10)
Calcium: 9 mg/dL (ref 8.4–10.5)
GFR calc Af Amer: 56 mL/min — ABNORMAL LOW (ref >90)
GFR calc non Af Amer: 48 mL/min — ABNORMAL LOW (ref >90)
Glucose, Bld: 213 mg/dL — ABNORMAL HIGH (ref 70–99)
POTASSIUM: 2.8 meq/L — AB (ref 3.7–5.3)
SODIUM: 136 meq/L — AB (ref 137–147)
Total Protein: 7.1 g/dL (ref 6.0–8.3)

## 2013-12-21 LAB — GLUCOSE, CAPILLARY
GLUCOSE-CAPILLARY: 148 mg/dL — AB (ref 70–99)
GLUCOSE-CAPILLARY: 150 mg/dL — AB (ref 70–99)
GLUCOSE-CAPILLARY: 152 mg/dL — AB (ref 70–99)
Glucose-Capillary: 195 mg/dL — ABNORMAL HIGH (ref 70–99)
Glucose-Capillary: 164 mg/dL — ABNORMAL HIGH (ref 70–99)

## 2013-12-21 LAB — CLOSTRIDIUM DIFFICILE BY PCR: Toxigenic C. Difficile by PCR: NEGATIVE

## 2013-12-21 LAB — AMYLASE: Amylase: 35 U/L (ref 0–105)

## 2013-12-21 LAB — RETICULOCYTES
RBC.: 3.6 MIL/uL — ABNORMAL LOW (ref 3.87–5.11)
Retic Count, Absolute: 18 10*3/uL — ABNORMAL LOW (ref 19.0–186.0)
Retic Ct Pct: 0.5 % (ref 0.4–3.1)

## 2013-12-21 LAB — PHOSPHORUS: Phosphorus: 2 mg/dL — ABNORMAL LOW (ref 2.3–4.6)

## 2013-12-21 LAB — PREALBUMIN: PREALBUMIN: 4.5 mg/dL — AB (ref 17.0–34.0)

## 2013-12-21 LAB — TRIGLYCERIDES: Triglycerides: 131 mg/dL (ref <150)

## 2013-12-21 LAB — MAGNESIUM: MAGNESIUM: 1.9 mg/dL (ref 1.5–2.5)

## 2013-12-21 LAB — LIPASE, BLOOD: LIPASE: 48 U/L (ref 11–59)

## 2013-12-21 MED ORDER — POTASSIUM CHLORIDE CRYS ER 10 MEQ PO TBCR
20.0000 meq | EXTENDED_RELEASE_TABLET | Freq: Once | ORAL | Status: DC
Start: 2013-12-21 — End: 2013-12-21
  Filled 2013-12-21: qty 2

## 2013-12-21 MED ORDER — POTASSIUM PHOSPHATE DIBASIC 3 MMOLE/ML IV SOLN
15.0000 mmol | Freq: Once | INTRAVENOUS | Status: AC
  Administered 2013-12-21: 15 mmol via INTRAVENOUS
  Filled 2013-12-21: qty 5

## 2013-12-21 MED ORDER — METOCLOPRAMIDE HCL 5 MG/ML IJ SOLN: 10.0000 mg | Freq: Four times a day (QID) | INTRAMUSCULAR | Status: DC | PRN

## 2013-12-21 MED ORDER — INSULIN REGULAR HUMAN 100 UNIT/ML IJ SOLN
INTRAMUSCULAR | Status: DC
  Administered 2013-12-21: 18:00:00 via INTRAVENOUS
  Filled 2013-12-21: qty 1000

## 2013-12-21 MED ORDER — POTASSIUM CHLORIDE 10 MEQ/50ML IV SOLN
INTRAVENOUS | Status: AC
  Administered 2013-12-21: 10 meq
  Filled 2013-12-21: qty 50

## 2013-12-21 MED ORDER — POTASSIUM CHLORIDE 10 MEQ/50ML IV SOLN
10.0000 meq | INTRAVENOUS | Status: AC
  Administered 2013-12-21 (×5): 10 meq via INTRAVENOUS
  Filled 2013-12-21 (×5): qty 50

## 2013-12-21 MED ORDER — POTASSIUM CHLORIDE CRYS ER 20 MEQ PO TBCR
40.0000 meq | EXTENDED_RELEASE_TABLET | Freq: Once | ORAL | Status: DC
  Filled 2013-12-21: qty 2

## 2013-12-21 MED ORDER — FAT EMULSION 20 % IV EMUL
250.0000 mL | INTRAVENOUS | Status: DC
  Administered 2013-12-21: 250 mL via INTRAVENOUS
  Filled 2013-12-21: qty 250

## 2013-12-21 MED ORDER — VALGANCICLOVIR HCL 450 MG PO TABS
450.0000 mg | ORAL_TABLET | Freq: Every day | ORAL | Status: DC
  Administered 2013-12-21 – 2013-12-23 (×3): 450 mg via ORAL
  Filled 2013-12-21 (×3): qty 1

## 2013-12-21 MED ORDER — POTASSIUM CHLORIDE 20 MEQ/15ML (10%) PO LIQD
20.0000 meq | Freq: Once | ORAL | Status: AC
  Administered 2013-12-21: 20 meq via ORAL
  Filled 2013-12-21: qty 15

## 2013-12-21 NOTE — Progress Notes (Signed)
PT Cancellation Note  Patient Details Name: AARUSHI HEMRIC MRN: [id number] DOB: November 18, 1955   Cancelled Treatment:    Reason Eval/Treat Not Completed: Medical issues which prohibited therapy (transfer back to ICU)will check back 3/27.   [name] 12/21/2013, 7:28 AM Tresa Endo PT [phone]

## 2013-12-21 NOTE — Progress Notes (Signed)
INFECTIOUS DISEASE PROGRESS NOTE  ID: CLARE FENNIMORE is a 58 y.o. female with  Active Problems:   Myelodysplasia   Symptomatic anemia   Adrenal infarction   Acute renal failure   Acute on chronic diastolic heart failure   Altered mental status   Acute-on-chronic respiratory failure   Hypotension, unspecified   Severe sepsis   DVT (deep venous thrombosis)   Pleural effusion   Thrombocytopenia   Hypokalemia  Subjective: Feels better, some nl BM and some loose BM.   Abtx:  Anti-infectives   Start     Dose/Rate Route Frequency Ordered Stop   12/21/13 1000  valGANciclovir (VALCYTE) 450 MG tablet TABS 450 mg     450 mg Oral Daily 12/21/13 0759     12/20/13 1700  micafungin (MYCAMINE) 100 mg in sodium chloride 0.9 % 100 mL IVPB     100 mg 100 mL/hr over 1 Hours Intravenous Every 24 hours 12/20/13 1634     12/19/13 1200  imipenem-cilastatin (PRIMAXIN) 500 mg in sodium chloride 0.9 % 100 mL IVPB     500 mg 200 mL/hr over 30 Minutes Intravenous 3 times per day 12/19/13 1025     12/19/13 1030  linezolid (ZYVOX) IVPB 600 mg     600 mg 300 mL/hr over 60 Minutes Intravenous Every 12 hours 12/19/13 1019     12/02/13 2230  ceFEPIme (MAXIPIME) 1 g in dextrose 5 % 50 mL IVPB  Status:  Discontinued     1 g 100 mL/hr over 30 Minutes Intravenous Every 12 hours 12/02/13 2223 12/06/13 1724   12/02/13 2200  linezolid (ZYVOX) IVPB 600 mg  Status:  Discontinued     600 mg 300 mL/hr over 60 Minutes Intravenous Every 12 hours 12/02/13 2145 12/06/13 1724   12/02/13 2200  micafungin (MYCAMINE) 100 mg in sodium chloride 0.9 % 100 mL IVPB  Status:  Discontinued     100 mg 100 mL/hr over 1 Hours Intravenous Every 24 hours 12/02/13 2145 12/06/13 1724   12/02/13 1200  vancomycin (VANCOCIN) 1,250 mg in sodium chloride 0.9 % 250 mL IVPB  Status:  Discontinued     1,250 mg 166.7 mL/hr over 90 Minutes Intravenous Every 24 hours 12/01/13 0802 12/02/13 2115   12/01/13 2200  ceFEPIme (MAXIPIME) 1 g in  dextrose 5 % 50 mL IVPB  Status:  Discontinued     1 g 100 mL/hr over 30 Minutes Intravenous Every 12 hours 12/01/13 2116 12/02/13 2115   12/01/13 0915  ceFEPIme (MAXIPIME) 2 g in dextrose 5 % 50 mL IVPB     2 g 100 mL/hr over 30 Minutes Intravenous STAT 12/01/13 0904 12/01/13 1111   12/01/13 0800  vancomycin (VANCOCIN) 2,000 mg in sodium chloride 0.9 % 500 mL IVPB     2,000 mg 250 mL/hr over 120 Minutes Intravenous  Once 12/01/13 0745 12/01/13 1241   11/30/13 1000  fluconazole (DIFLUCAN) IVPB 200 mg  Status:  Discontinued     200 mg 100 mL/hr over 60 Minutes Intravenous Every 24 hours 11/30/13 0752 12/02/13 2115   11/30/13 0900  acyclovir (ZOVIRAX) 230 mg in dextrose 5 % 100 mL IVPB  Status:  Discontinued     5 mg/kg  45.5 kg (Ideal) 104.6 mL/hr over 60 Minutes Intravenous Every 8 hours 11/30/13 0752 12/06/13 1724   11/25/13 1800  ceFEPIme (MAXIPIME) 2 g in dextrose 5 % 50 mL IVPB  Status:  Discontinued     2 g 100 mL/hr over 30 Minutes Intravenous  Every 24 hours 11/25/13 1243 11/27/13 0750   11/24/13 0800  vancomycin (VANCOCIN) 1,250 mg in sodium chloride 0.9 % 250 mL IVPB  Status:  Discontinued     1,250 mg 166.7 mL/hr over 90 Minutes Intravenous Every 24 hours 11/24/13 0706 11/27/13 0740   11/08/2013 1800  ceFEPIme (MAXIPIME) 1 g in dextrose 5 % 50 mL IVPB  Status:  Discontinued     1 g 100 mL/hr over 30 Minutes Intravenous Every 24 hours 11/22/13 1944 11/25/13 1243   11/12/2013 1000  voriconazole (VFEND) tablet 200 mg  Status:  Discontinued     200 mg Oral Every 12 hours 10/30/2013 0842 11/25/13 1214   11/25/2013 0800  vancomycin (VANCOCIN) 1,250 mg in sodium chloride 0.9 % 250 mL IVPB     1,250 mg 166.7 mL/hr over 90 Minutes Intravenous  Once 11/22/2013 0618 11/16/2013 0930   11/22/13 2000  vancomycin (VANCOCIN) 1,250 mg in sodium chloride 0.9 % 250 mL IVPB  Status:  Discontinued     1,250 mg 166.7 mL/hr over 90 Minutes Intravenous Every 24 hours 11/22/13 1720 11/22/13 1926   11/22/13 1800   ceFEPIme (MAXIPIME) 2 g in dextrose 5 % 50 mL IVPB  Status:  Discontinued     2 g 100 mL/hr over 30 Minutes Intravenous Every 24 hours 11/22/13 1720 11/22/13 1944   11/22/13 1000  acyclovir (ZOVIRAX) 200 MG capsule 800 mg  Status:  Discontinued     800 mg Oral 3 times daily 11/22/13 0714 11/22/13 0727   11/22/13 1000  acyclovir (ZOVIRAX) tablet 800 mg  Status:  Discontinued     800 mg Oral 3 times daily 11/22/13 0727 11/22/13 1942   11/18/13 1000  acyclovir (ZOVIRAX) 200 MG capsule 400 mg  Status:  Discontinued     400 mg Oral 3 times daily 11/18/13 0734 11/19/13 0804   11/18/13 1000  fluconazole (DIFLUCAN) tablet 100 mg  Status:  Discontinued     100 mg Oral Daily 11/18/13 0734 11/21/2013 0807   11/16/13 1200  imipenem-cilastatin (PRIMAXIN) 500 mg in sodium chloride 0.9 % 100 mL IVPB  Status:  Discontinued     500 mg 200 mL/hr over 30 Minutes Intravenous Every 8 hours 11/16/13 0731 11/18/13 0734   11/15/13 1000  vancomycin (VANCOCIN) 1,250 mg in sodium chloride 0.9 % 250 mL IVPB     1,250 mg 166.7 mL/hr over 90 Minutes Intravenous Every 24 hours 11/14/13 0852 11/21/13 1125   11/14/13 1600  imipenem-cilastatin (PRIMAXIN) 250 mg in sodium chloride 0.9 % 100 mL IVPB  Status:  Discontinued     250 mg 200 mL/hr over 30 Minutes Intravenous 4 times per day 11/14/13 0856 11/16/13 0731   11/14/13 1000  voriconazole (VFEND) 360 mg in sodium chloride 0.9 % 150 mL IVPB  Status:  Discontinued     4 mg/kg  89.3 kg 93 mL/hr over 120 Minutes Intravenous Every 12 hours 11/13/13 0806 11/15/13 0807   11/14/13 1000  acyclovir (ZOVIRAX) 230 mg in dextrose 5 % 100 mL IVPB  Status:  Discontinued     5 mg/kg  45.5 kg (Ideal) 104.6 mL/hr over 60 Minutes Intravenous Every 12 hours 11/14/13 0840 11/17/13 0714   11/14/13 0900  vancomycin (VANCOCIN) 2,000 mg in sodium chloride 0.9 % 500 mL IVPB     2,000 mg 250 mL/hr over 120 Minutes Intravenous  Once 11/14/13 0851 11/14/13 1456   11/14/13 0745  acyclovir  (ZOVIRAX) 485 mg in dextrose 5 % 100 mL IVPB  Status:  Discontinued     5 mg/kg  96.6 kg 109.7 mL/hr over 60 Minutes Intravenous 3 times per day 11/14/13 0742 11/14/13 0839   11/13/13 1600  imipenem-cilastatin (PRIMAXIN) 500 mg in sodium chloride 0.9 % 100 mL IVPB  Status:  Discontinued     500 mg 200 mL/hr over 30 Minutes Intravenous Every 8 hours 11/13/13 0752 11/14/13 0856   11/13/13 0900  voriconazole (VFEND) 540 mg in sodium chloride 0.9 % 150 mL IVPB     6 mg/kg  89.3 kg 102 mL/hr over 120 Minutes Intravenous Every 12 hours 11/13/13 0806 11/14/13 0110   11/13/13 0830  imipenem-cilastatin (PRIMAXIN) 500 mg in sodium chloride 0.9 % 100 mL IVPB     500 mg 200 mL/hr over 30 Minutes Intravenous STAT 11/13/13 0750 11/13/13 1127   11/13/13 0730  ceFEPIme (MAXIPIME) 1 g in dextrose 5 % 50 mL IVPB  Status:  Discontinued     1 g 100 mL/hr over 30 Minutes Intravenous 3 times per day 11/13/13 0718 11/13/13 0750   11/10/13 2000  vancomycin (VANCOCIN) 500 mg in sodium chloride 0.9 % 100 mL IVPB  Status:  Discontinued     500 mg 100 mL/hr over 60 Minutes Intravenous Every 12 hours 11/10/13 1824 11/12/13 1500   11/08/13 1800  vancomycin (VANCOCIN) IVPB 750 mg/150 ml premix  Status:  Discontinued     750 mg 150 mL/hr over 60 Minutes Intravenous Every 12 hours 11/08/13 0307 11/10/13 1824   11/08/13 1000  ciprofloxacin (CIPRO) IVPB 400 mg  Status:  Discontinued     400 mg 200 mL/hr over 60 Minutes Intravenous Every 12 hours 11/10/2013 2325 11/11/13 0812   11/08/13 0600  aztreonam (AZACTAM) 1 g in dextrose 5 % 50 mL IVPB  Status:  Discontinued     1 g 100 mL/hr over 30 Minutes Intravenous 3 times per day 11/08/13 0307 11/10/13 1356   11/08/13 0030  vancomycin (VANCOCIN) IVPB 1000 mg/200 mL premix     1,000 mg 200 mL/hr over 60 Minutes Intravenous  Once 11/08/13 0020 11/08/13 0354   11/08/13 0000  aztreonam (AZACTAM) 2 g in dextrose 5 % 50 mL IVPB     2 g 100 mL/hr over 30 Minutes Intravenous  Once  11/16/2013 2357 11/08/13 0151   11/17/2013 2130  ciprofloxacin (CIPRO) IVPB 400 mg     400 mg 200 mL/hr over 60 Minutes Intravenous  Once 11/20/2013 2111 11/22/2013 2325      Medications:  Scheduled: . DULoxetine  60 mg Oral QPM  . feeding supplement (ENSURE COMPLETE)  237 mL Oral TID WC  . fluticasone  2 spray Each Nare Daily  . hydrocortisone sod succinate (SOLU-CORTEF) inj  50 mg Intravenous Q6H  . imipenem-cilastatin  500 mg Intravenous 3 times per day  . insulin aspart  0-9 Units Subcutaneous 6 times per day  . linezolid  600 mg Intravenous Q12H  . micafungin (MYCAMINE) IV  100 mg Intravenous Q24H  . multivitamin with minerals  1 tablet Oral Daily  . pantoprazole (PROTONIX) IV  40 mg Intravenous Daily  . potassium chloride  10 mEq Intravenous Q1 Hr x 5  . potassium chloride  40 mEq Oral Once  . sodium chloride  10-40 mL Intracatheter Q12H  . sodium chloride  3 mL Intravenous Q12H  . triamcinolone cream   Topical TID  . valGANciclovir  450 mg Oral Daily    Objective: Vital signs in last 24 hours: Temp:  [97.5 F (36.4 C)-99.5 F (37.5  C)] 97.7 F (36.5 C) (03/26 0800) Pulse Rate:  [103-141] 110 (03/26 0600) Resp:  [17-38] 19 (03/26 0600) BP: (80-111)/(40-78) 109/58 mmHg (03/26 0600) SpO2:  [92 %-100 %] 94 % (03/26 0600) Weight:  [77.3 kg (170 lb 6.7 oz)-77.4 kg (170 lb 10.2 oz)] 77.3 kg (170 lb 6.7 oz) (03/26 0350)   General appearance: alert, cooperative and no distress Resp: diminished breath sounds bilaterally Cardio: regular rate and rhythm GI: normal findings: bowel sounds normal, soft, non-tender and mild distension Extremities: edema no LE, mild UE  Lab Results  Recent Labs  12/20/13 0405 12/20/13 1115 12/21/13 0410  WBC 10.9* 10.9* 12.3*  HGB 7.6* 8.0* 10.1*  HCT 22.9* 23.9* 30.5*  NA 136*  --  136*  K 3.3*  --  2.8*  CL 99  --  100  CO2 22  --  22  BUN 7  --  14  CREATININE 1.03  --  1.20*   Liver Panel  Recent Labs  12/20/13 0405 12/21/13 0410   PROT 6.6 7.1  ALBUMIN 2.1* 2.1*  AST 13 10  ALT 12 10  ALKPHOS 102 103  BILITOT 1.4* 1.6*   Sedimentation Rate No results found for this basename: ESRSEDRATE,  in the last 72 hours C-Reactive Protein No results found for this basename: CRP,  in the last 72 hours  Microbiology: Recent Results (from the past 240 hour(s))  URINE CULTURE     Status: None   Collection Time    12/15/13 10:30 AM      Result Value Ref Range Status   Specimen Description URINE, CATHETERIZED   Final   Special Requests Immunocompromised   Final   Culture  Setup Time     Final   Value: 12/15/2013 13:04     Performed at [place]     Final   Value: 6,000 COLONIES/ML     Performed at [facility]   Culture     Final   Value: VANCOMYCIN RESISTANT ENTEROCOCCUS ISOLATED     Note: CRITICAL RESULT CALLED TO, READ BACK BY AND VERIFIED WITH: KRISTEN [email or web address]  ON [id number] BY [facility]     Performed at [facility]   Report Status 12/19/2013 FINAL   Final   Organism ID, Bacteria VANCOMYCIN RESISTANT ENTEROCOCCUS ISOLATED   Final  CULTURE, BLOOD (ROUTINE X 2)     Status: None   Collection Time    12/19/13  8:10 AM      Result Value Ref Range Status   Specimen Description BLOOD RIGHT ARM   Final   Special Requests BOTTLES DRAWN AEROBIC ONLY 1 CC   Final   Culture  Setup Time     Final   Value: 12/19/2013 12:14     Performed at [facility]   Culture     Final   Value:        BLOOD CULTURE RECEIVED NO GROWTH TO DATE CULTURE WILL BE HELD FOR 5 DAYS BEFORE ISSUING A FINAL NEGATIVE REPORT     Performed at [facility]   Report Status PENDING   Incomplete  CULTURE, BLOOD (ROUTINE X 2)     Status: None   Collection Time    12/19/13  8:20 AM      Result Value Ref Range Status   Specimen Description BLOOD RIGHT ARM   Final   Special Requests BOTTLES DRAWN AEROBIC AND ANAEROBIC 2 CC EACH   Final   Culture  Setup Time  Final   Value: 12/19/2013 12:14      Performed at [facility]   Culture     Final   Value:        BLOOD CULTURE RECEIVED NO GROWTH TO DATE CULTURE WILL BE HELD FOR 5 DAYS BEFORE ISSUING A FINAL NEGATIVE REPORT     Performed at [facility]   Report Status PENDING   Incomplete    Studies/Results: Dg Chest Port 1 View  12/20/2013   CLINICAL DATA:  Increasing shortness of breath.  EXAM: PORTABLE CHEST - 1 VIEW  COMPARISON:  DG CHEST 1V PORT dated 12/19/2013; DG CHEST 2 VIEW dated 12/14/2013  FINDINGS: Low lung volumes. Increased cardiomediastinal silhouette. Continued fluid in the minor fissure with small bilateral pleural effusions blunting the costophrenic angles. PICC line tip cavoatrial junction.  IMPRESSION: Slight worsening aeration with low lung volumes. No active infiltrates or failure.   Electronically Signed   By: Rolla Flatten M.D.   On: 12/20/2013 11:06   Dg Chest Port 1 View  12/19/2013   CLINICAL DATA:  Fever and cough .  EXAM: PORTABLE CHEST - 1 VIEW  COMPARISON:  DG CHEST 2 VIEW dated 12/14/2013; DG CHEST 2 VIEW dated 12/11/2013; CT CHEST W/O CM dated 12/01/2013  FINDINGS: Mediastinum stable. Interim partial clearing of right lower lobe atelectasis. Right perihilar atelectasis with possible underlying mass noted. Minimal blunting of the costophrenic angles noted. Small pleural effusions cannot be excluded. Heart size normal. PICC line noted with tip projected at the cavoatrial junction. Prior cervical spine fusion.  IMPRESSION: 1. Right perihilar/upper lobe atelectatic changes. Underlying right hilar mass cannot be excluded. 2. Interim partial clearing of right lower lobe atelectasis. 3. Tiny pleural effusions. 4. PICC line in good anatomic position.   Electronically Signed   By: Marcello Moores  Register   On: 12/19/2013 12:13     Assessment/Plan: MDS  Hodgkin's lymphoma  Fever  Thrombocytopenia  UCx VRE (6k)  Total days of antibiotics: 3 imipenem/zyvox, day 2 micafungin  Hold on CT scans for now Repeat CXR Use  zyvox with caution (may be making her PLT drop), try to give short course.  Await BCx, CMV PCR         Bobby Rumpf Infectious Diseases (pager) [phone] [email or web address] 12/21/2013, 9:40 AM  LOS: 44 days

## 2013-12-21 NOTE — Progress Notes (Signed)
Lacey [name] looks much better than yesterday. I suspect that she was "on the verge" of sepsis. 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 She's on Primaxin and Zyvox. Blood cultures are negative to date.  She had 2 units of blood. I think this is helped.  Is no bleeding. She says that she's not hurting. She is now on TNA. She had no vomiting yesterday. We will try to advance her diet. If she does well today, then I will stop the TNA.  I don't believe that she has had a transfusion reaction since she has not been transfused for a while. We only have issues with platelets. Again I would not transfuse her with platelets for a while.  I just believe that with her immune system being compromised, and it does not take much to initiate a "septic" picture.  I do appreciate the input from Dr. Johnnye Sima of infectious disease.  Her potassium is still low. Her potassium is being  replaced. Her white cell count is 12.3. Hemoglobin 10.1. Platelet count 16,000. Her renal function is doing okay.  She is more alert. She is oriented.  On physical exam, her vital signs are stable. Blood pressure 109/58. Temperature 97.5. Pulse is 110. Oral exam does show the in ulcer on the left side of the tongue. There is no adenopathy or neck. Oral mucosa and is somewhat dry. Lungs sound pretty good. She good breath sounds. Cardiac exam tachycardic but regular. Abdomen is soft. Not as distended. Bowel sounds are active. The skin rash on her arms and legs is starting to resolve. Neurological exam is nonfocal.  Had a very long talk with her today. She was cognizant. I talked for about life support issues. I told her that I felt that if she were to go on life support that she would not come off and that if she did come off, she would be an invalid and and that we would not be able to treat her. She, currently, the is not a candidate for bone marrow transplant because of her performance status.  She is taking about her wishes for being kept alive on  life support.  Her problem currently is the lack of nutrition. I will check a prealbumin on her.  We need to talk to her about palliative issues. I will talk to her power of attorney. Unfortunately her husband died of a massive heart attack last week that was totally unexpected. This has been a real struggle for her.  I spent about 45 minutes with her this morning. I was glad that she is looking better and feeling better.  [name]  Psalm 116:10

## 2013-12-21 NOTE — Progress Notes (Signed)
PARENTERAL NUTRITION CONSULT NOTE - FOLLOW UP  Pharmacy Consult for TNA Indication: Vomiting, no PO intake < 7 days  Allergies  Allergen Reactions  . Penicillins Hives and Rash  . Contrast Media [Iodinated Diagnostic Agents]     Patient Measurements: Height: 5' (152.4 cm) Weight: 170 lb 6.7 oz (77.3 kg) IBW/kg (Calculated) : 45.5 Usual Weight: 76.7kg  Vital Signs: Temp: 97.5 F (36.4 C) (03/26 0400) Temp src: Oral (03/26 0400) BP: 109/58 mmHg (03/26 0600) Pulse Rate: 110 (03/26 0600) Intake/Output from previous day: 03/25 0701 - 03/26 0700 In: 4274 [P.O.:520; I.V.:590.3; Blood:1425; IV Piggyback:1250; TPN:488.7] Out: 1725 [Urine:1725] Intake/Output from this shift:    Labs:  Recent Labs  12/20/13 0405 12/20/13 1115 12/21/13 0410  WBC 10.9* 10.9* 12.3*  HGB 7.6* 8.0* 10.1*  HCT 22.9* 23.9* 30.5*  PLT 18* 20* 16*     Recent Labs  12/19/13 0430 12/20/13 0405 12/20/13 1520 12/21/13 0410  NA 136* 136*  --  136*  K 2.9* 3.3*  --  2.8*  CL 101 99  --  100  CO2 23 22  --  22  GLUCOSE 131* 135*  --  213*  BUN 6 7  --  14  CREATININE 1.02 1.03  --  1.20*  CALCIUM 8.6 8.6  --  9.0  MG 1.5  --   --  1.9  PHOS  --   --  2.1* 2.0*  PROT 6.3 6.6  --  7.1  ALBUMIN 2.1* 2.1*  --  2.1*  AST 14 13  --  10  ALT 13 12  --  10  ALKPHOS 100 102  --  103  BILITOT 1.3* 1.4*  --  1.6*   Estimated Creatinine Clearance: 46.4 ml/min (by C-G formula based on Cr of 1.2).    Recent Labs  12/20/13 1939 12/20/13 2320 12/21/13 0332  GLUCAP 184* 209* 195*    Insulin Requirements in the past 24 hours:  7 units, no hx DM, no SSI/basal insulin ordered   Current Nutrition:  Clear liquid diet - no documented intake since 3/18  Ensure complete TID with meals - charted once 3/24   Nutritional Goals:  RD recs: Kcal: 1300-1500, Protein: 55-70g, Fluid: 1.3-1.5L/day  Clinimix E5/15 at a goal rate of 33ml/hr + IVFE 20% at 41ml/hr to provide: 78g/day protein, 1588Kcal/day avg    [facility]: [facility]  Lines/Drains/Airways:  PICC triple lumen left basilic (placed 12/30/30)   Assessment:  Glucose -ABOVE  Goal of < 150mg /dl since start of TPN, also on hydrocortisone BID for adrenal insufficiency  Electrolytes - Na 136, K = 2.8, Phos = 2, Mg = 1.9, Corr Ca = 10.5 (ULN)  Renal: SCr bumped from 1 to 1.2, CrCl <57ml/min LFTs - Tbili sl elevated at 1.6 TGs - 131 (3/26)  Prealbumin -pending (3/26)  77 yoF with hx Hodgkin's lymphoma and MDS admitted since 2/10 with sepsis complicated with adrenal infarct, adrenal insuffiency, and HCAP. Pt started treatment Heparin to lovenox for superficial thrombus in R cephalic vein and hypercoaguable state, however developed hemorrahge into her left iliopsoas muscle and has remained off anticoagulation since 2/24. Pt completed 1 cycle of vidaza (2 doses held) 3/11 - 3/17 for MDS however has refractory anemia and thrombocytopenia requiring almost daily blood product transfusions. Antibiotics restarted 3/24 for VRE urine infection. Pt also with progressive rash s/p skin biopsy, nonspecific results. On 3/25: Pharmacy consulted to start TPN for persistent vomiting and no oral intake since 3/18. Spoke with Dr. Jonette Eva  about possible trial of tube feeds, however he wishes to proceed with TPN tonight. RD consult completed yesterday.   3/26: D#1 TPN   Plan:   Due to elevated CBGs (above goal of 150mg /dl) and electrolyte abnormalities (? Touch of Refeeding), will not advance TPN tonight.  Continue with Clinimix E5/15 at 21ml/hr (goal = 18ml/hr)  Lipids 20% at 10 ml/hr  Add 10 units regular insulin to TPN  Electrolyte replacement - orders for 5 runs of KCl, give Kphos 12mmol (will provide additional 54meq K+)  On PO multivitamin (charted as given) - will not place MVI or TE in TPN  Labs in am  Doreene Eland, PharmD, BCPS.   Pager: [phone]  12/21/2013,7:21 AM

## 2013-12-21 NOTE — Progress Notes (Signed)
Pt with k+ if 2.8 midlevel paged awaiting call back.

## 2013-12-21 NOTE — Progress Notes (Signed)
[facility] PROGRESS NOTE  Lacey [name] KZL:[id number] DOB: [birth year] DOA: 11/09/2013 PCP: Vena Austria, MD  Assessment/Plan  1. 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 -  myelodysplastic/myeloproliferative process, particularly chronic myelomonocytic leukemia  - Appreciate Dr Jonette Eva assistance. on frequent transfusions (PRBC+platelets) as patient is developing antibodies. Per hem/onc, started Vidaza on 3/11, since stopped  -3/17: Plt down to 14. Watch closely for need for transfusion (if <10 or active bleeding).  -3/17: Hb down to 7.1.  -3/18 - Hb to 6.8 - 2 units PRBC's transfused  -3/25 - hgb 7.6 - 2 units PRBC ordered - Per Dr. Marin Olp, patient is likely not a good candidate for bone marrow transplant after his last conversation with [facility]  2. Recurrent fever/septic shock, pneumonia Likely due to dysfunctional leukocytes as above  -CT chest on 2/23: Persistent consolidation RML with new peribronchovascular opacities suggestive of bronchopneumonia. Underwent bronch on 2/26; BCx NGTD, Diff PCR neg, BAL: PCP neg, Bacterial, fungal, and AFB cultures NGTD  - 3/6: febrile; CT: Persistent right upper lobe collapse/ consolidation with interval development of central airspace disease in the left upper lobe  - 3/6: restarted IV atx, antifungals, acyclovir  -off pressors; clinically improving, started taper IV steroids 3/10  -3/11: PICC line out. Stopped all antibiotics and antifungals as all cx data has been negative to [name].  - 3/24: Pt noted to be febrile this AM with elevated WBC of 11/7 (from 8.4 yesterday). Unclear source. UA normal.  -  Blood cx NGTD  -  Continue linezolid and primaxin day 3  -  Micafungin started 3/25 by ID -  Appreciate ID assistance -  Continue stress dose  steroids -  CXR:  Worsening aeration and no active infiltrates -  F/u CMV  3. Acute on chronic diastolic heart failure due to IVF from resuscitation. Echo (2/15): Preserved EF and grade 1 diastolic heart failure  - R sided pleural effusion; anasarca, prn IV lasix while sick - Daily weights and strict I/O; monitor renal function  - Per pulmonology deferred thoracentesis due to thrombocytopenia, R sided pleural effusion   4. 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 Acute Renal Failure due to septic shock; RUS wnl.   -  Receiving fluids via TPN  6. Spontaneous retroperitoneal bleed in setting of thrombocytopenia; hemorrhage into her left iliopsoas muscle which caused weakness and pain of her left leg and LLQ  - on fentanyl; cont pain control  - Resume PT/OT  7. Right upper extremity superficial thrombosis, stable   8. Transaminitis due to septic shock, resolved.   9. Urge and stress incontinence; Foley placed for comfort   10. Tongue ulcer; cont as above; biopsied by ENT; path: SQUAMOUS EPITHELIUM. NO DYSPLASIA OR MALIGNANCY  -improving   11. Hypokalemia, replete with IV and oral potassium  12. Progressive Rash  - Surgery consulted for skin biopsy  - Reactive findings - nonspecific findings   13. Nausea/vomiting  - Plain film xray without obstruction  - Stool output documented on 3/16 and again on 3/20    Severe protein calorie malnutrition -  Appreciate nutrition assistance -  Started on TPN by Dr. Marin Olp 3/25  Diet:  Advance to soft mechanical Access:  port IVF:  yes Proph:  SCDs  Code Status: full, however, Dr. Marin Olp  began conversation about code status with patient today Family Communication: patient and POA cathy Disposition Plan:  Continue stepdown today and if VS continue to improve, may be able to move out tomorrow.    Consultants:  Dr. Burney Gauze (oncology)  Dr Gibson Ramp (Surgery)  curbside consult  Dr. Carlyle Basques (infectious disease)  Procedures:  11/12/2013 right upper extremity Doppler  No evidence of deep vein thrombosis involving the right upper extremity, left subclavian vein, and left internal jugular vein. There is superficial thrombosis noted in the right cephalic vein.  Old PICC removed 3/5; Left-sided PICC 03/06 >>>  Foley 3/2 >>>    -2/12 Bone marrow biopsy report pending  Echocardiogram 01/10/2013  - Left ventricle: mild focal basal hypertrophy of the septum.  -LVEF= ejection fraction was 55%. Wall motion was normal; there were no regional wall motion abnormalities.  -(grade 1 diastolic dysfunction).  - Aortic valve: There was no stenosis. Trivial regurgitation. - Mitral valve: Trivial regurgitation. - Left atrium: The atrium was mildly dilated. - Right ventricle: The cavity size was normal. Systolic function was normal. - Tricuspid valve: Peak RV-RA gradient: 75m Hg (S). - Pulmonary arteries: PA peak pressure: 233mHg (S). - Inferior vena cava: The vessel was normal in size; the respirophasic diameter changes were in the normal range (=50%); findings are consistent with normal central venous pressure.  2/15 Upper extremity Dopplers demonstrated acute clot of the right cephalic vein  2/1/09hest CT demonstrated loculated right pleural effusion which does not appear to have intact  2/17 Echocardiogram demonstrates EF of 5532-35%ith diastolic dysfunction.  2/24 Abdominal CT demonstrated hemorrhage in the left iliacus muscle  2/23 Chest CT demonstrated persistent right pleural effusion and right middle lobe density  2/25 Head CT demonstrated sphenoid sinusitis  2/26 Bronchoscopy did not reveal an infectious organism  3/3 Lower extremity Dopplers did not detect DVTs  3/6 Chest CT> persistent collapse of the RML, RLL with a large pleural effusion. Development of a new left airspace opacity.  3/8 TTE >>> Ef 65%, no RWMA, PAP 35 torr  3/8 Abdomen CT  >>> No findings consistent with sepsis, evolution of L iliac hematoma  3/8 [place] Effusion>>>small in volume, loculated & complex appearing by [place] THORA  Abx: linezolid 3/24 >> Imipenem 3/24 >> micafungin 3/25 >>  HPI/Subjective:  Patient states that she feels better today.  Coughing, but breathing not labored.  Denies nausea altogether  Objective: Filed Vitals:   12/21/13 0515 12/21/13 0530 12/21/13 0545 12/21/13 0600  BP:  100/70  109/58  Pulse: 114 109 110 110  Temp:      TempSrc:      Resp: _0 Height:      Weight:      SpO2: 96% 94% 95% 94%    Intake/Output Summary (Last 24 hours) at 12/21/13 0744 Last data filed at 12/21/13 0600  Gross per 24 hour  Intake   4274 ml  Output   1725 ml  Net   2549 ml   Filed Weights   12/20/13 0801 12/20/13 1200 12/21/13 0350  Weight: 75.8 kg (167 lb 1.7 oz) 77.4 kg (170 lb 10.2 oz) 77.3 kg (170 lb 6.7 oz)    Exam:   General:  CF, No acute distress, more color in cheeks and appears more well today  HEENT:  NCAT, MMM  Cardiovascular:  Tachycardia, RR, nl S1, S2 no mrg, 2+ pulses, warm extremities  Respiratory:  rhonchorous BS, diminished at bilateral bases,  no wheeze, no increased WOB  Abdomen:   NABS, soft, ND, mild TTP LLE  MSK:   Normal tone and bulk, trace LEE  Neuro:  Weakness LLE  Data Reviewed: Basic Metabolic Panel:  Recent Labs Lab 12/17/13 0540 12/18/13 0520 12/19/13 0430 12/20/13 0405 12/20/13 1520 12/21/13 0410  NA 135* 139 136* 136*  --  136*  K 3.3* 2.9* 2.9* 3.3*  --  2.8*  CL 100 103 101 99  --  100  CO2 _0 --  22  GLUCOSE 114* 115* 131* 135*  --  213*  BUN _1 --  14  CREATININE 0.95 0.92 1.02 1.03  --  1.20*  CALCIUM 8.6 8.7 8.6 8.6  --  9.0  MG  --  1.6 1.5  --   --  1.9  PHOS  --   --   --   --  2.1* 2.0*   Liver Function Tests:  Recent Labs Lab 12/17/13 0540 12/18/13 0520 12/19/13 0430 12/20/13 0405 12/21/13 0410  AST _2 ALT _3 ALKPHOS 113 107 100 102 103  BILITOT 0.7 0.8 1.3* 1.4* 1.6*  PROT 6.4 6.5 6.3 6.6 7.1  ALBUMIN 2.3* 2.2* 2.1* 2.1* 2.1*    Recent Labs Lab 12/15/13 0405 12/21/13 0430  LIPASE 57 48  AMYLASE 86 35   No results found for this basename: AMMONIA,  in the last 168 hours CBC:  Recent Labs Lab 12/17/13 0540 12/18/13 0520 12/19/13 0430 12/20/13 0405 12/20/13 1115 12/21/13 0410  WBC 8.2 8.4 11.7* 10.9* 10.9* 12.3*  NEUTROABS 5.2 5.1 8.1* 8.2*  --  9.8*  HGB 8.4* 8.2* 7.8* 7.6* 8.0* 10.1*  HCT 24.9* 24.9* 23.3* 22.9* 23.9* 30.5*  MCV 85.3 86.2 85.3 86.1 84.8 84.7  PLT 15* 17* 14* 18* 20* 16*   Cardiac Enzymes:  Recent Labs Lab 12/20/13 1115  TROPONINI <0.30   BNP (last 3 results)  Recent Labs  11/14/13 0435 11/17/13 0800 11/20/13 0515  PROBNP 2977.0* 9541.0* 3178.0*   CBG:  Recent Labs Lab 12/20/13 1227 12/20/13 1637 12/20/13 1939 12/20/13 2320 12/21/13 0332  GLUCAP 88 153* 184* 209* 195*    Recent Results (from the past 240 hour(s))  URINE CULTURE     Status: None   Collection Time    12/15/13 10:30 AM      Result Value Ref Range Status   Specimen Description URINE, CATHETERIZED   Final   Special Requests Immunocompromised   Final   Culture  Setup Time     Final   Value: 12/15/2013 13:04     Performed at [facility] Count     Final   Value: 6,000 COLONIES/ML     Performed at [facility]   Culture     Final   Value: VANCOMYCIN RESISTANT ENTEROCOCCUS ISOLATED     Note: CRITICAL RESULT CALLED TO, READ BACK BY AND VERIFIED WITH: [email or web address] [email or web address]  ON [id number] BY [facility]     Performed at [facility]   Report Status 12/19/2013 FINAL   Final   Organism ID, Bacteria VANCOMYCIN RESISTANT ENTEROCOCCUS ISOLATED   Final  CULTURE, BLOOD (ROUTINE X 2)     Status: None   Collection Time    12/19/13  8:10 AM      Result Value Ref Range Status   Specimen Description BLOOD RIGHT ARM   Final   Special Requests BOTTLES  DRAWN AEROBIC ONLY 1 CC   Final   Culture  Setup Time     Final   Value: 12/19/2013 12:14     Performed at [facility]   Culture     Final   Value:        BLOOD CULTURE RECEIVED NO GROWTH TO [name] CULTURE WILL BE HELD FOR 5 DAYS BEFORE ISSUING A FINAL NEGATIVE REPORT     Performed at [facility]   Report Status PENDING   Incomplete  CULTURE, BLOOD (ROUTINE X 2)     Status: None   Collection Time    12/19/13  8:20 AM      Result Value Ref Range Status   Specimen Description BLOOD RIGHT ARM   Final   Special Requests BOTTLES DRAWN AEROBIC AND ANAEROBIC 2 CC EACH   Final   Culture  Setup Time     Final   Value: 12/19/2013 12:14     Performed at [facility]   Culture     Final   Value:        BLOOD CULTURE RECEIVED NO GROWTH TO [name] CULTURE WILL BE HELD FOR 5 DAYS BEFORE ISSUING A FINAL NEGATIVE REPORT     Performed at [facility]   Report Status PENDING   Incomplete     Studies: Dg Chest Port 1 View  12/20/2013   CLINICAL DATA:  Increasing shortness of breath.  EXAM: PORTABLE CHEST - 1 VIEW  COMPARISON:  DG CHEST 1V PORT dated 12/19/2013; DG CHEST 2 VIEW dated 12/14/2013  FINDINGS: Low lung volumes. Increased cardiomediastinal silhouette. Continued fluid in the minor fissure with small bilateral pleural effusions blunting the costophrenic angles. PICC line tip cavoatrial junction.  IMPRESSION: Slight worsening aeration with low lung volumes. No active infiltrates or failure.   Electronically Signed   By: Rolla Flatten M.D.   On: 12/20/2013 11:06   Dg Chest Port 1 View  12/19/2013   CLINICAL DATA:  Fever and cough .  EXAM: PORTABLE CHEST - 1 VIEW  COMPARISON:  DG CHEST 2 VIEW dated 12/14/2013; DG CHEST 2 VIEW dated 12/11/2013; CT CHEST W/O CM dated 12/01/2013  FINDINGS: Mediastinum stable. Interim partial clearing of right lower lobe atelectasis. Right perihilar atelectasis with possible underlying mass noted. Minimal blunting of the costophrenic angles noted.  Small pleural effusions cannot be excluded. Heart size normal. PICC line noted with tip projected at the cavoatrial junction. Prior cervical spine fusion.  IMPRESSION: 1. Right perihilar/upper lobe atelectatic changes. Underlying right hilar mass cannot be excluded. 2. Interim partial clearing of right lower lobe atelectasis. 3. Tiny pleural effusions. 4. PICC line in good anatomic position.   Electronically Signed   By: Marcello Moores  Register   On: 12/19/2013 12:13    Scheduled Meds: . citalopram  20 mg Oral Daily  . DULoxetine  60 mg Oral QPM  . feeding supplement (ENSURE COMPLETE)  237 mL Oral TID WC  . fluticasone  2 spray Each Nare Daily  . hydrocortisone sod succinate (SOLU-CORTEF) inj  50 mg Intravenous Q6H  . imipenem-cilastatin  500 mg Intravenous 3 times per day  . insulin aspart  0-9 Units Subcutaneous 6 times per day  . linezolid  600 mg Intravenous Q12H  . metoCLOPramide (REGLAN) injection  10 mg Intravenous TID AC & HS  . micafungin (MYCAMINE) IV  100 mg Intravenous Q24H  . multivitamin with minerals  1 tablet Oral Daily  . pantoprazole (PROTONIX) IV  40 mg Intravenous  Daily  . potassium chloride  10 mEq Intravenous Q1 Hr x 5  . sodium chloride  10-40 mL Intracatheter Q12H  . sodium chloride  3 mL Intravenous Q12H  . triamcinolone cream   Topical TID   Continuous Infusions: . sodium chloride 20 mL/hr at 12/20/13 1800  . Marland KitchenTPN (CLINIMIX-E) Adult 30 mL/hr at 12/20/13 1747   And  . fat emulsion 250 mL (12/20/13 1747)    Active Problems:   Myelodysplasia   Symptomatic anemia   Adrenal infarction   Acute renal failure   Acute on chronic diastolic heart failure   Altered mental status   Acute-on-chronic respiratory failure   Hypotension, unspecified   Severe sepsis   DVT (deep venous thrombosis)   Pleural effusion   Thrombocytopenia   Hypokalemia    Time spent: 30 min    Manami Tutor, [place]  [facility] Pager [phone]. If 7PM-7AM, please contact night-coverage  at [email or web address], password [facility] 12/21/2013, 7:44 AM  LOS: 44 days

## 2013-12-22 DIAGNOSIS — R7989 Other specified abnormal findings of blood chemistry: Secondary | ICD-10-CM

## 2013-12-22 DIAGNOSIS — R197 Diarrhea, unspecified: Secondary | ICD-10-CM

## 2013-12-22 LAB — COMPREHENSIVE METABOLIC PANEL
ALBUMIN: 2 g/dL — AB (ref 3.5–5.2)
ALT: 12 U/L (ref 0–35)
AST: 15 U/L (ref 0–37)
Alkaline Phosphatase: 94 U/L (ref 39–117)
BILIRUBIN TOTAL: 0.9 mg/dL (ref 0.3–1.2)
BUN: 26 mg/dL — AB (ref 6–23)
CO2: 20 mEq/L (ref 19–32)
Calcium: 8.8 mg/dL (ref 8.4–10.5)
Chloride: 101 mEq/L (ref 96–112)
Creatinine, Ser: 1.05 mg/dL (ref 0.50–1.10)
GFR calc non Af Amer: 57 mL/min — ABNORMAL LOW (ref >90)
GFR, EST AFRICAN AMERICAN: 66 mL/min — AB (ref >90)
Glucose, Bld: 184 mg/dL — ABNORMAL HIGH (ref 70–99)
Potassium: 3.4 mEq/L — ABNORMAL LOW (ref 3.7–5.3)
Sodium: 137 mEq/L (ref 137–147)
TOTAL PROTEIN: 6.9 g/dL (ref 6.0–8.3)

## 2013-12-22 LAB — CBC WITH DIFFERENTIAL/PLATELET
Basophils Absolute: 0.1 10*3/uL (ref 0.0–0.1)
Basophils Relative: 2 % — ABNORMAL HIGH (ref 0–1)
EOS ABS: 0 10*3/uL (ref 0.0–0.7)
Eosinophils Relative: 0 % (ref 0–5)
HEMATOCRIT: 42.7 % (ref 36.0–46.0)
Hemoglobin: 15 g/dL (ref 12.0–15.0)
LYMPHS PCT: 14 % (ref 12–46)
Lymphs Abs: 0.7 10*3/uL (ref 0.7–4.0)
MCH: 29.1 pg (ref 26.0–34.0)
MCHC: 35.1 g/dL (ref 30.0–36.0)
MCV: 82.9 fL (ref 78.0–100.0)
MONO ABS: 0.3 10*3/uL (ref 0.1–1.0)
MONOS PCT: 6 % (ref 3–12)
NEUTROS ABS: 3.6 10*3/uL (ref 1.7–7.7)
Neutrophils Relative %: 78 % — ABNORMAL HIGH (ref 43–77)
PLATELETS: 10 10*3/uL — AB (ref 150–400)
RBC: 5.15 MIL/uL — ABNORMAL HIGH (ref 3.87–5.11)
RDW: 16.1 % — AB (ref 11.5–15.5)
WBC: 4.7 10*3/uL (ref 4.0–10.5)

## 2013-12-22 LAB — GLUCOSE, CAPILLARY
GLUCOSE-CAPILLARY: 148 mg/dL — AB (ref 70–99)
Glucose-Capillary: 149 mg/dL — ABNORMAL HIGH (ref 70–99)
Glucose-Capillary: 138 mg/dL — ABNORMAL HIGH (ref 70–99)

## 2013-12-22 LAB — RETICULOCYTES: RBC.: 5.15 MIL/uL — ABNORMAL HIGH (ref 3.87–5.11)

## 2013-12-22 LAB — PHOSPHORUS: PHOSPHORUS: 3.8 mg/dL (ref 2.3–4.6)

## 2013-12-22 LAB — MAGNESIUM: Magnesium: 2 mg/dL (ref 1.5–2.5)

## 2013-12-22 MED ORDER — ALTEPLASE 2 MG IJ SOLR
2.0000 mg | Freq: Once | INTRAMUSCULAR | Status: AC
  Administered 2013-12-22: 2 mg
  Filled 2013-12-22: qty 2

## 2013-12-22 MED ORDER — SODIUM CHLORIDE 0.9 % IJ SOLN
10.0000 mL | INTRAMUSCULAR | Status: DC | PRN
  Administered 2013-12-23 (×2): 10 mL
  Administered 2013-12-23: 20 mL
  Administered 2013-12-24: 10 mL

## 2013-12-22 MED ORDER — PANTOPRAZOLE SODIUM 40 MG PO TBEC
40.0000 mg | DELAYED_RELEASE_TABLET | Freq: Every day | ORAL | Status: DC
  Administered 2013-12-22 – 2013-12-23 (×2): 40 mg via ORAL
  Filled 2013-12-22 (×2): qty 1

## 2013-12-22 MED ORDER — SODIUM CHLORIDE 0.9 % IV SOLN
100.0000 mg | INTRAVENOUS | Status: DC
  Administered 2013-12-22: 100 mg via INTRAVENOUS
  Filled 2013-12-22 (×2): qty 100

## 2013-12-22 MED ORDER — SODIUM CHLORIDE 0.9 % IJ SOLN
10.0000 mL | Freq: Two times a day (BID) | INTRAMUSCULAR | Status: DC
  Administered 2013-12-23 (×2): 10 mL

## 2013-12-22 MED ORDER — METOCLOPRAMIDE HCL 5 MG/ML IJ SOLN
5.0000 mg | Freq: Two times a day (BID) | INTRAMUSCULAR | Status: DC
  Administered 2013-12-22 – 2013-12-23 (×3): 5 mg via INTRAVENOUS
  Filled 2013-12-22 (×2): qty 1
  Filled 2013-12-22: qty 2
  Filled 2013-12-22: qty 1

## 2013-12-22 MED ORDER — HYDROCORTISONE 10 MG PO TABS
10.0000 mg | ORAL_TABLET | Freq: Two times a day (BID) | ORAL | Status: DC
  Administered 2013-12-22 – 2013-12-23 (×3): 10 mg via ORAL
  Filled 2013-12-22 (×4): qty 1

## 2013-12-22 MED ORDER — SACCHAROMYCES BOULARDII 250 MG PO CAPS
250.0000 mg | ORAL_CAPSULE | Freq: Two times a day (BID) | ORAL | Status: DC
  Administered 2013-12-22 – 2013-12-24 (×5): 250 mg via ORAL
  Filled 2013-12-22 (×8): qty 1

## 2013-12-22 MED ORDER — SODIUM CHLORIDE 0.9 % IV SOLN
500.0000 mg | Freq: Once | INTRAVENOUS | Status: AC
  Administered 2013-12-22: 500 mg via INTRAVENOUS
  Filled 2013-12-22: qty 500

## 2013-12-22 MED ORDER — SODIUM CHLORIDE 0.9 % IV SOLN
500.0000 mg | Freq: Three times a day (TID) | INTRAVENOUS | Status: DC
  Administered 2013-12-22 – 2013-12-24 (×6): 500 mg via INTRAVENOUS
  Filled 2013-12-22 (×8): qty 500

## 2013-12-22 MED ORDER — LOPERAMIDE HCL 2 MG PO CAPS
2.0000 mg | ORAL_CAPSULE | ORAL | Status: DC | PRN
  Administered 2013-12-22 – 2013-12-23 (×5): 2 mg via ORAL
  Filled 2013-12-22 (×10): qty 1

## 2013-12-22 MED ORDER — POTASSIUM CHLORIDE 10 MEQ/100ML IV SOLN
10.0000 meq | INTRAVENOUS | Status: AC
  Administered 2013-12-22 (×3): 10 meq via INTRAVENOUS
  Filled 2013-12-22 (×4): qty 100

## 2013-12-22 NOTE — Progress Notes (Addendum)
PHARMACY BRIEF NOTE - Antibiotics  Consult for:  Primaxin Indication:  Concern for febrile neutropenia  The Primaxin order was discontinued this morning and now has been resumed.  We will continue the same dose, 500 mg IV every 8 hours.  Today is Day #4 for Primaxin therapy.  [place]. 12/22/2013 5:22 PM

## 2013-12-22 NOTE — Progress Notes (Signed)
Patient moved up from ICU today. Discharge plan is still for [facility]. Awaiting updated PT/OT notes to submit to [facility] for insurance authorization.   CSW will follow-up Monday.   Raynaldo Opitz, [facility] Clinical Social Worker cell #: [phone]

## 2013-12-22 NOTE — Progress Notes (Signed)
NUTRITION FOLLOW UP  Intervention:   - Continue Ensure Complete TID - Encouraged increased meal intake as tolerated - On Zyvox however not consuming any high tyramine foods at this time - RD to continue to monitor   Nutrition Dx:   Inadequate oral intake related to nausea/vomiting as evidenced by pt report - improving   Goal:   1. Resolution of nausea/vomiting - met 2. Pt to consume >90% of meals/supplements - not met   Monitor:   Weights, labs, intake  Assessment:   Met with pt earlier in admission on 2/23, during which time she was eating excellent and had reported eating well PTA and denied any changes in weight. Inpatient RD met with pt 3/16 to discuss diet therapy for sore mouth and changes in taste/smell. During RD visit, pt had reported her appetite was improving and was drinking Ensure Complete occasionally.   Pt's husband died suddenly 2022/12/26. Noted pt started having vomiting and several episodes of nausea 3/19. Last chemo was 3/16.   3/24 - Met with pt who reports having nausea/vomiting for the past 2 weeks. Last documented PO intake was 2022/12/26 with pt consuming 50% of meals. Pt agreeable to getting scheduled Ensure Complete. Noted pt's weight down 26 pounds since November 2014.   3/27 - Per conversation with oncologist, pt does not want a feeding tube. Plan is to d/c TPN today. Met with pt who reports eating 50% of meals on regular diet (advanced today) and reports she has consumed 2 Ensure Complete so far today. Denies any nausea. Only c/o is 4 episodes of diarrhea today. Getting Imodium and Florastor.   Potassium low, getting multivitamin   Height: Ht Readings from Last 1 Encounters:  12/20/13 5' (1.524 m)    Weight Status:   Wt Readings from Last 1 Encounters:  12/22/13 171 lb 4.8 oz (77.7 kg)  11/06/2013         184 lb (83.4 kg)  Re-estimated needs:  Kcal: 1300-1500  Protein: 55-70g  Fluid: 1.3-1.5L/day   Skin: +1 generalized edema, +1 RUE, LUE, LLE, RLE  edema   Diet Order: General   Intake/Output Summary (Last 24 hours) at 12/22/13 1414 Last data filed at 12/22/13 0800  Gross per 24 hour  Intake   1860 ml  Output    450 ml  Net   1410 ml    Last BM: 3/26   Labs:   Recent Labs Lab 12/19/13 0430 12/20/13 0405 12/20/13 1520 12/21/13 0410 12/22/13 0600  NA 136* 136*  --  136* 137  K 2.9* 3.3*  --  2.8* 3.4*  CL 101 99  --  100 101  CO2 23 22  --  22 20  BUN 6 7  --  14 26*  CREATININE 1.02 1.03  --  1.20* 1.05  CALCIUM 8.6 8.6  --  9.0 8.8  MG 1.5  --   --  1.9 2.0  PHOS  --   --  2.1* 2.0* 3.8  GLUCOSE 131* 135*  --  213* 184*    CBG (last 3)   Recent Labs  12/21/13 2354 12/22/13 0347 12/22/13 0808  GLUCAP 148* 149* 138*    Scheduled Meds: . DULoxetine  60 mg Oral QPM  . feeding supplement (ENSURE COMPLETE)  237 mL Oral TID WC  . fluticasone  2 spray Each Nare Daily  . hydrocortisone  10 mg Oral BID  . linezolid  600 mg Intravenous Q12H  . metoCLOPramide (REGLAN) injection  5 mg Intravenous  Q12H  . multivitamin with minerals  1 tablet Oral Daily  . pantoprazole  40 mg Oral Daily  . saccharomyces boulardii  250 mg Oral BID  . sodium chloride  10-40 mL Intracatheter Q12H  . sodium chloride  3 mL Intravenous Q12H  . triamcinolone cream   Topical TID  . valGANciclovir  450 mg Oral Daily    Continuous Infusions: . sodium chloride 20 mL/hr at 12/22/13 [place], RD, [place] Pager [phone] After Hours Pager

## 2013-12-22 NOTE — Progress Notes (Signed)
OT Cancellation Note  Patient Details Name: Lacey [name] MRN: [id number] DOB: 1956/04/09   Cancelled Treatment:    Reason Eval/Treat Not Completed: Medical issues which prohibited therapy  Noted Platelets are 10 and transfusion planned.  Will check back another day.  [name] 12/22/2013, 7:56 AM [name], OTR/L [phone] 12/22/2013

## 2013-12-22 NOTE — Progress Notes (Signed)
INFECTIOUS DISEASE PROGRESS NOTE  ID: Lacey [name] is a 58 y.o. female with  Active Problems:   Myelodysplasia   Symptomatic anemia   Adrenal infarction   Acute renal failure   Acute on chronic diastolic heart failure   Altered mental status   Acute-on-chronic respiratory failure   Hypotension, unspecified   Severe sepsis   DVT (deep venous thrombosis)   Pleural effusion   Thrombocytopenia   Hypokalemia  Subjective: Resting quietly.   Abtx:  Anti-infectives   Start     Dose/Rate Route Frequency Ordered Stop   12/21/13 1000  valGANciclovir (VALCYTE) 450 MG tablet TABS 450 mg     450 mg Oral Daily 12/21/13 0759     12/20/13 1700  micafungin (MYCAMINE) 100 mg in sodium chloride 0.9 % 100 mL IVPB  Status:  Discontinued     100 mg 100 mL/hr over 1 Hours Intravenous Every 24 hours 12/20/13 1634 12/22/13 0749   12/19/13 1200  imipenem-cilastatin (PRIMAXIN) 500 mg in sodium chloride 0.9 % 100 mL IVPB  Status:  Discontinued     500 mg 200 mL/hr over 30 Minutes Intravenous 3 times per day 12/19/13 1025 12/22/13 0737   12/19/13 1030  linezolid (ZYVOX) IVPB 600 mg     600 mg 300 mL/hr over 60 Minutes Intravenous Every 12 hours 12/19/13 1019     12/02/13 2230  ceFEPIme (MAXIPIME) 1 g in dextrose 5 % 50 mL IVPB  Status:  Discontinued     1 g 100 mL/hr over 30 Minutes Intravenous Every 12 hours 12/02/13 2223 12/06/13 1724   12/02/13 2200  linezolid (ZYVOX) IVPB 600 mg  Status:  Discontinued     600 mg 300 mL/hr over 60 Minutes Intravenous Every 12 hours 12/02/13 2145 12/06/13 1724   12/02/13 2200  micafungin (MYCAMINE) 100 mg in sodium chloride 0.9 % 100 mL IVPB  Status:  Discontinued     100 mg 100 mL/hr over 1 Hours Intravenous Every 24 hours 12/02/13 2145 12/06/13 1724   12/02/13 1200  vancomycin (VANCOCIN) 1,250 mg in sodium chloride 0.9 % 250 mL IVPB  Status:  Discontinued     1,250 mg 166.7 mL/hr over 90 Minutes Intravenous Every 24 hours 12/01/13 0802 12/02/13 2115   12/01/13 2200  ceFEPIme (MAXIPIME) 1 g in dextrose 5 % 50 mL IVPB  Status:  Discontinued     1 g 100 mL/hr over 30 Minutes Intravenous Every 12 hours 12/01/13 2116 12/02/13 2115   12/01/13 0915  ceFEPIme (MAXIPIME) 2 g in dextrose 5 % 50 mL IVPB     2 g 100 mL/hr over 30 Minutes Intravenous STAT 12/01/13 0904 12/01/13 1111   12/01/13 0800  vancomycin (VANCOCIN) 2,000 mg in sodium chloride 0.9 % 500 mL IVPB     2,000 mg 250 mL/hr over 120 Minutes Intravenous  Once 12/01/13 0745 12/01/13 1241   11/30/13 1000  fluconazole (DIFLUCAN) IVPB 200 mg  Status:  Discontinued     200 mg 100 mL/hr over 60 Minutes Intravenous Every 24 hours 11/30/13 0752 12/02/13 2115   11/30/13 0900  acyclovir (ZOVIRAX) 230 mg in dextrose 5 % 100 mL IVPB  Status:  Discontinued     5 mg/kg  45.5 kg (Ideal) 104.6 mL/hr over 60 Minutes Intravenous Every 8 hours 11/30/13 0752 12/06/13 1724   11/25/13 1800  ceFEPIme (MAXIPIME) 2 g in dextrose 5 % 50 mL IVPB  Status:  Discontinued     2 g 100 mL/hr over 30 Minutes Intravenous  Every 24 hours 11/25/13 1243 11/27/13 0750   11/24/13 0800  vancomycin (VANCOCIN) 1,250 mg in sodium chloride 0.9 % 250 mL IVPB  Status:  Discontinued     1,250 mg 166.7 mL/hr over 90 Minutes Intravenous Every 24 hours 11/24/13 0706 11/27/13 0740   December 23, 2013 1800  ceFEPIme (MAXIPIME) 1 g in dextrose 5 % 50 mL IVPB  Status:  Discontinued     1 g 100 mL/hr over 30 Minutes Intravenous Every 24 hours 11/22/13 1944 11/25/13 1243   2013/12/23 1000  voriconazole (VFEND) tablet 200 mg  Status:  Discontinued     200 mg Oral Every 12 hours 12/23/2013 0842 11/25/13 1214   December 23, 2013 0800  vancomycin (VANCOCIN) 1,250 mg in sodium chloride 0.9 % 250 mL IVPB     1,250 mg 166.7 mL/hr over 90 Minutes Intravenous  Once December 23, 2013 0618 12-23-2013 0930   11/22/13 2000  vancomycin (VANCOCIN) 1,250 mg in sodium chloride 0.9 % 250 mL IVPB  Status:  Discontinued     1,250 mg 166.7 mL/hr over 90 Minutes Intravenous Every 24 hours  11/22/13 1720 11/22/13 1926   11/22/13 1800  ceFEPIme (MAXIPIME) 2 g in dextrose 5 % 50 mL IVPB  Status:  Discontinued     2 g 100 mL/hr over 30 Minutes Intravenous Every 24 hours 11/22/13 1720 11/22/13 1944   11/22/13 1000  acyclovir (ZOVIRAX) 200 MG capsule 800 mg  Status:  Discontinued     800 mg Oral 3 times daily 11/22/13 0714 11/22/13 0727   11/22/13 1000  acyclovir (ZOVIRAX) tablet 800 mg  Status:  Discontinued     800 mg Oral 3 times daily 11/22/13 0727 11/22/13 1942   11/18/13 1000  acyclovir (ZOVIRAX) 200 MG capsule 400 mg  Status:  Discontinued     400 mg Oral 3 times daily 11/18/13 0734 11/19/13 0804   11/18/13 1000  fluconazole (DIFLUCAN) tablet 100 mg  Status:  Discontinued     100 mg Oral Daily 11/18/13 0734 12-23-2013 0807   11/16/13 1200  imipenem-cilastatin (PRIMAXIN) 500 mg in sodium chloride 0.9 % 100 mL IVPB  Status:  Discontinued     500 mg 200 mL/hr over 30 Minutes Intravenous Every 8 hours 11/16/13 0731 11/18/13 0734   11/15/13 1000  vancomycin (VANCOCIN) 1,250 mg in sodium chloride 0.9 % 250 mL IVPB     1,250 mg 166.7 mL/hr over 90 Minutes Intravenous Every 24 hours 11/14/13 0852 11/21/13 1125   11/14/13 1600  imipenem-cilastatin (PRIMAXIN) 250 mg in sodium chloride 0.9 % 100 mL IVPB  Status:  Discontinued     250 mg 200 mL/hr over 30 Minutes Intravenous 4 times per day 11/14/13 0856 11/16/13 0731   11/14/13 1000  voriconazole (VFEND) 360 mg in sodium chloride 0.9 % 150 mL IVPB  Status:  Discontinued     4 mg/kg  89.3 kg 93 mL/hr over 120 Minutes Intravenous Every 12 hours 11/13/13 0806 11/15/13 0807   11/14/13 1000  acyclovir (ZOVIRAX) 230 mg in dextrose 5 % 100 mL IVPB  Status:  Discontinued     5 mg/kg  45.5 kg (Ideal) 104.6 mL/hr over 60 Minutes Intravenous Every 12 hours 11/14/13 0840 11/17/13 0714   11/14/13 0900  vancomycin (VANCOCIN) 2,000 mg in sodium chloride 0.9 % 500 mL IVPB     2,000 mg 250 mL/hr over 120 Minutes Intravenous  Once 11/14/13 0851  11/14/13 1456   11/14/13 0745  acyclovir (ZOVIRAX) 485 mg in dextrose 5 % 100 mL IVPB  Status:  Discontinued     5 mg/kg  96.6 kg 109.7 mL/hr over 60 Minutes Intravenous 3 times per day 11/14/13 0742 11/14/13 0839   11/13/13 1600  imipenem-cilastatin (PRIMAXIN) 500 mg in sodium chloride 0.9 % 100 mL IVPB  Status:  Discontinued     500 mg 200 mL/hr over 30 Minutes Intravenous Every 8 hours 11/13/13 0752 11/14/13 0856   11/13/13 0900  voriconazole (VFEND) 540 mg in sodium chloride 0.9 % 150 mL IVPB     6 mg/kg  89.3 kg 102 mL/hr over 120 Minutes Intravenous Every 12 hours 11/13/13 0806 11/14/13 0110   11/13/13 0830  imipenem-cilastatin (PRIMAXIN) 500 mg in sodium chloride 0.9 % 100 mL IVPB     500 mg 200 mL/hr over 30 Minutes Intravenous STAT 11/13/13 0750 11/13/13 1127   11/13/13 0730  ceFEPIme (MAXIPIME) 1 g in dextrose 5 % 50 mL IVPB  Status:  Discontinued     1 g 100 mL/hr over 30 Minutes Intravenous 3 times per day 11/13/13 0718 11/13/13 0750   11/10/13 2000  vancomycin (VANCOCIN) 500 mg in sodium chloride 0.9 % 100 mL IVPB  Status:  Discontinued     500 mg 100 mL/hr over 60 Minutes Intravenous Every 12 hours 11/10/13 1824 11/12/13 1500   11/08/13 1800  vancomycin (VANCOCIN) IVPB 750 mg/150 ml premix  Status:  Discontinued     750 mg 150 mL/hr over 60 Minutes Intravenous Every 12 hours 11/08/13 0307 11/10/13 1824   11/08/13 1000  ciprofloxacin (CIPRO) IVPB 400 mg  Status:  Discontinued     400 mg 200 mL/hr over 60 Minutes Intravenous Every 12 hours 11/27/2013 2325 11/11/13 0812   11/08/13 0600  aztreonam (AZACTAM) 1 g in dextrose 5 % 50 mL IVPB  Status:  Discontinued     1 g 100 mL/hr over 30 Minutes Intravenous 3 times per day 11/08/13 0307 11/10/13 1356   11/08/13 0030  vancomycin (VANCOCIN) IVPB 1000 mg/200 mL premix     1,000 mg 200 mL/hr over 60 Minutes Intravenous  Once 11/08/13 0020 11/08/13 0354   11/08/13 0000  aztreonam (AZACTAM) 2 g in dextrose 5 % 50 mL IVPB     2  g 100 mL/hr over 30 Minutes Intravenous  Once November 27, 2013 2357 11/08/13 0151   11/27/13 2130  ciprofloxacin (CIPRO) IVPB 400 mg     400 mg 200 mL/hr over 60 Minutes Intravenous  Once 11/27/13 2111 27-Nov-2013 2325      Medications:  Scheduled: . DULoxetine  60 mg Oral QPM  . feeding supplement (ENSURE COMPLETE)  237 mL Oral TID WC  . fluticasone  2 spray Each Nare Daily  . hydrocortisone  10 mg Oral BID  . metoCLOPramide (REGLAN) injection  5 mg Intravenous Q12H  . micafungin (MYCAMINE) IV  100 mg Intravenous Daily  . multivitamin with minerals  1 tablet Oral Daily  . pantoprazole  40 mg Oral Daily  . potassium chloride  10 mEq Intravenous Q1 Hr x 4  . saccharomyces boulardii  250 mg Oral BID  . sodium chloride  10-40 mL Intracatheter Q12H  . sodium chloride  3 mL Intravenous Q12H  . triamcinolone cream   Topical TID  . valGANciclovir  450 mg Oral Daily    Objective: Vital signs in last 24 hours: Temp:  [97.7 F (36.5 C)-99.1 F (37.3 C)] 98.1 F (36.7 C) (03/27 1515) Pulse Rate:  [83-119] 109 (03/27 1515) Resp:  [20-29] 22 (03/27 1515) BP: (103-133)/(55-93) 117/72 mmHg (03/27 1515) SpO2:  [  90 %-98 %] 96 % (03/27 1515) Weight:  [171 lb 4.8 oz (77.7 kg)] 171 lb 4.8 oz (77.7 kg) (03/27 0403)   General appearance: no distress Resp: clear to auscultation bilaterally Cardio: regular rate and rhythm GI: normal findings: aorta normal and soft, non-tender  Lab Results  Recent Labs  12/21/13 0410 12/22/13 0600  WBC 12.3* 4.7  HGB 10.1* 15.0  HCT 30.5* 42.7  NA 136* 137  K 2.8* 3.4*  CL 100 101  CO2 22 20  BUN 14 26*  CREATININE 1.20* 1.05   Liver Panel  Recent Labs  12/21/13 0410 12/22/13 0600  PROT 7.1 6.9  ALBUMIN 2.1* 2.0*  AST 10 15  ALT 10 12  ALKPHOS 103 94  BILITOT 1.6* 0.9   Sedimentation Rate No results found for this basename: ESRSEDRATE,  in the last 72 hours C-Reactive Protein No results found for this basename: CRP,  in the last 72  hours  Microbiology: Recent Results (from the past 240 hour(s))  URINE CULTURE     Status: None   Collection Time    12/15/13 10:30 AM      Result Value Ref Range Status   Specimen Description URINE, CATHETERIZED   Final   Special Requests Immunocompromised   Final   Culture  Setup Time     Final   Value: 12/15/2013 13:04     Performed at [place]     Final   Value: 6,000 COLONIES/ML     Performed at [facility]   Culture     Final   Value: VANCOMYCIN RESISTANT ENTEROCOCCUS ISOLATED     Note: CRITICAL RESULT CALLED TO, READ BACK BY AND VERIFIED WITH: KRISTEN [email or web address]  ON [id number] BY [facility]     Performed at [facility]   Report Status 12/19/2013 FINAL   Final   Organism ID, Bacteria VANCOMYCIN RESISTANT ENTEROCOCCUS ISOLATED   Final  CULTURE, BLOOD (ROUTINE X 2)     Status: None   Collection Time    12/19/13  8:10 AM      Result Value Ref Range Status   Specimen Description BLOOD RIGHT ARM   Final   Special Requests BOTTLES DRAWN AEROBIC ONLY 1 CC   Final   Culture  Setup Time     Final   Value: 12/19/2013 12:14     Performed at [facility]   Culture     Final   Value:        BLOOD CULTURE RECEIVED NO GROWTH TO DATE CULTURE WILL BE HELD FOR 5 DAYS BEFORE ISSUING A FINAL NEGATIVE REPORT     Performed at [facility]   Report Status PENDING   Incomplete  CULTURE, BLOOD (ROUTINE X 2)     Status: None   Collection Time    12/19/13  8:20 AM      Result Value Ref Range Status   Specimen Description BLOOD RIGHT ARM   Final   Special Requests BOTTLES DRAWN AEROBIC AND ANAEROBIC 2 CC EACH   Final   Culture  Setup Time     Final   Value: 12/19/2013 12:14     Performed at [facility]   Culture     Final   Value:        BLOOD CULTURE RECEIVED NO GROWTH TO DATE CULTURE WILL BE HELD FOR 5 DAYS BEFORE ISSUING A FINAL NEGATIVE REPORT     Performed at [facility]  Report Status PENDING   Incomplete  WOUND  CULTURE     Status: None   Collection Time    12/21/13 10:35 AM      Result Value Ref Range Status   Specimen Description ORAL ULCER LT SIDE ON TONGUE   Final   Special Requests Immunocompromised   Final   Gram Stain     Final   Value: NO WBC SEEN     NO SQUAMOUS EPITHELIAL CELLS SEEN     RARE GRAM POSITIVE RODS     FEW YEAST     Performed at [facility]   Culture     Final   Value: NO GROWTH     Performed at [facility]   Report Status PENDING   Incomplete  CLOSTRIDIUM DIFFICILE BY PCR     Status: None   Collection Time    12/21/13  1:08 PM      Result Value Ref Range Status   C difficile by pcr NEGATIVE  NEGATIVE Final   Comment: Performed at [place]     Status: None   Collection Time    12/21/13  1:31 PM      Result Value Ref Range Status   Specimen Description OTHER TONGUE   Final   Special Requests Immunocompromised   Final   Culture     Final   Value: Culture has been initiated.     Note:    The current Enterovirus culture system does not detect Enterovirus D68. If Enterovirus D68 is suspected, please call client services to add the test for Enterovirus PCR (Test code [phone]).     Performed at [facility]   Report Status PENDING   Incomplete    Studies/Results: No results found.   Assessment/Plan: MDS  Hodgkin's lymphoma  Fever  Thrombocytopenia  UCx VRE (6k)  Total days of antibiotics  4 imipenem/zyvox, day 3 micafungin  Started on valcyte yesterday BCx ngtd CMV serologies pending Will stop zyvox, her plt are 10k. Her UCx was only 6k Continue imipenem and micafungin for now.          Bobby Rumpf Infectious Diseases (pager) [phone] [email or web address] 12/22/2013, 4:20 PM  LOS: 45 days

## 2013-12-22 NOTE — Progress Notes (Signed)
Lacey [name] is doing okay today. She has a good blood pressure. Heart rate is not going too fast. Oxygen level is very good.  Her CBC shows her platelet count to be 10K. I'll go ahead and give her a platelet transfusion. We will need to get HLA or donor specific platelets. I have a hard time believing that her hemoglobin is 15.  There is no metabolic panel back.  Her prealbumin is only 4.5. This I think is going to be our real issue. I do not see that TNA is going to help this. She's not had any vomiting for a couple of days so we will advance her diet.  I talked to her at length about this. She clearly understood what I was saying. I told her that her prealbumin was low. I told her that if we cannot get this up, that she likely would not live more than 6-8 weeks. She definitely understood this and realizes we serious nature of her problem. I told her that TNA was not going to help so we would stop this. The only way that the pre-albumin would get better is for her to eat or for a feeding tube to be put into her. She does not want to have a feeding tube put into her. I understand this. I agree with this. She will try to eat more.  I spoke to her power of attorney, Lacey [name], this morning. I told her what was going on and I talked to her about a prealbumin level. She also understands the problem and the seriousness of this.  I spoke to Lacey [name] about life support issues. I told her that if she were to go on life support, that she would never survive this. Again, with her prealbumin so low, she just would never be strong enough to get off life support. She told me that she DOES NOT want to be kept alive by heroic measures. I agree with this. As such, she is DO NOT RESUSCITATE. I spoke to her power of attorney about this. She also agrees with the DO NOT RESUSCITATE order.  She is having some diarrhea. I think this probably is from the Ramos. I will change the dosing.  I think we probably stop the  Primaxin. I would continue the Zyvox probably for another day or so. I am not sure we need the antifungal medicine.  On her exam, her lungs sound clear. Cardiac exam tachycardic but regular. Abdomen is soft. Bowel sounds present. Her skin rash continues to improve nicely.   I know that this was a very tough conversation with Lacey [name] today. I think we're at a point we really have to make a decision as to how much more aggressively can be. With her prealbumin is low, I really do not see that she is going to have any more treatment and that she clearly is not a transplant candidate.  Hopefully, she'll be moved out of the ICU and the next day or so.  Lacey E.  Ephesians 4:32

## 2013-12-22 NOTE — Progress Notes (Signed)
PT Cancellation Note  Patient Details Name: Lacey [name] MRN: [id number] DOB: [birth year]   Cancelled Treatment:    Reason Eval/Treat Not Completed: Medical issues which prohibited therapy--platelets 10. Pt awaiting transfusion. Will hold therapy today and check back another day. Thanks.    Weston Anna, MPT Pager: [phone]

## 2013-12-22 NOTE — Progress Notes (Signed)
PARENTERAL NUTRITION CONSULT NOTE - FOLLOW UP  Pharmacy Consult for TNA Indication: Vomiting, no PO intake < 7 days  Allergies  Allergen Reactions  . Penicillins Hives and Rash  . Contrast Media [Iodinated Diagnostic Agents]    Patient Measurements: Height: 5' (152.4 cm) Weight: 171 lb 4.8 oz (77.7 kg) IBW/kg (Calculated) : 45.5 Usual Weight: 76.7kg  Vital Signs: Temp: 97.9 F (36.6 C) (03/27 0700) Temp src: Oral (03/27 0700) BP: 119/86 mmHg (03/27 0600) Pulse Rate: 111 (03/27 0600) Intake/Output from previous day: 03/26 0701 - 03/27 0700 In: 2895 [P.O.:220; I.V.:460; IV Piggyback:1305; TPN:910] Out: 450 [Urine:450] Intake/Output from this shift:    Labs:  Recent Labs  12/20/13 1115 12/21/13 0410 12/22/13 0600  WBC 10.9* 12.3* 4.7  HGB 8.0* 10.1* 15.0  HCT 23.9* 30.5* 42.7  PLT 20* 16* 10*    Recent Labs  12/20/13 0405 12/20/13 1520 12/21/13 0405 12/21/13 0410 12/22/13 0600  NA 136*  --   --  136* 137  K 3.3*  --   --  2.8* 3.4*  CL 99  --   --  100 101  CO2 22  --   --  22 20  GLUCOSE 135*  --   --  213* 184*  BUN 7  --   --  14 26*  CREATININE 1.03  --   --  1.20* 1.05  CALCIUM 8.6  --   --  9.0 8.8  MG  --   --   --  1.9 2.0  PHOS  --  2.1*  --  2.0* 3.8  PROT 6.6  --   --  7.1 6.9  ALBUMIN 2.1*  --   --  2.1* 2.0*  AST 13  --   --  10 15  ALT 12  --   --  10 12  ALKPHOS 102  --   --  103 94  BILITOT 1.4*  --   --  1.6* 0.9  PREALBUMIN  --   --   --  4.5*  --   TRIG  --   --  131  --   --    Estimated Creatinine Clearance: 53.2 ml/min (by C-G formula based on Cr of 1.05).   Recent Labs  12/21/13 2354 12/22/13 0347 12/22/13 0808  GLUCAP 148* 149* 138*   Insulin Requirements in the past 24 hours:  5 units, no hx DM, on hydrocortisone-decreasing dose, now po Discontinue Novolog with d/c TNA today  Current Nutrition:  Begin regular diet today  Ensure complete TID with meals - charted once 3/24 & 3/26 Patient to increase oral  intake  Nutritional Goals:  RD recs: Kcal: 1300-1500, Protein: 55-70g, Fluid: 1.3-1.5L/day  Clinimix E5/15 at a goal rate of 40ml/hr + IVFE 20% at 2ml/hr to provide: 78g/day protein, 1588Kcal/day avg   IVF: NS at [facility]  Lines/Drains/Airways:  PICC triple lumen left basilic (placed 01/02/95)   Assessment:  Glucose CBG's < 150mg /dl with insulin 10 units/L , also on hydrocortisone po BID for adrenal insufficiency  Electrolytes - Na 137, K = 3.4, Phos = 3.8, Mg = 2, Corr Ca = 10  Renal: SCr bumped from 1 to 1.2 to 1.05, CrCl ~49ml/min LFTs - Tbili decreased TGs - 131 (3/26)  Prealbumin -4.5 (3/26)  36 yoF with hx Hodgkin's lymphoma and MDS admitted since 2/10 with sepsis complicated with adrenal infarct, adrenal insuffiency, and HCAP. Pt started treatment Heparin to lovenox for superficial thrombus in R cephalic vein and hypercoaguable state, however  developed hemorrahge into her left iliopsoas muscle and has remained off anticoagulation since 2/24. Pt completed 1 cycle of vidaza (2 doses held) 3/11 - 3/17 for MDS however has refractory anemia and thrombocytopenia requiring almost daily blood product transfusions. Antibiotics restarted 3/24 for VRE urine infection. Pt also with progressive rash s/p skin biopsy, nonspecific results. On 3/25: Pharmacy consulted to start TPN for persistent vomiting and no oral intake since 3/18. Spoke with Dr. Jonette Eva about possible trial of tube feeds, however he wishes to proceed with TPN tonight. RD consult completed yesterday.   Plan:   Discontinue TNA today   D/c labs, discontinue Novolog insulin  On PO multivitamin (charted as given) - will not place MVI or TE in TPN  Minda Ditto PharmD Pager [phone] 12/22/2013, 9:10 AM

## 2013-12-22 NOTE — Progress Notes (Signed)
[facility] PROGRESS NOTE  Lacey [name] OEU:[id number] DOB: [birth year] DOA: 11/15/2013 PCP: Vena Austria, MD  Assessment/Plan  1. 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  Bone Marrow Biopsy: HYPERCELLULAR BONE MARROW FOR AGE WITH DYSPOIETIC CHANGES (chronic myelomonocytic leukemia) - Appreciate Dr Jonette Eva assistance.  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 Marin Olp, not a good candidate for bone marrow transplant after his last conversation with [facility] due to her declining functional status  2. Recurrent fever/septic shock, pneumonia Likely due to dysfunctional leukocytes as above  -CT chest on 2/23: Persistent consolidation RML with new peribronchovascular opacities suggestive of bronchopneumonia. Underwent bronch on 2/26; BCx NGTD, Diff PCR neg, BAL: PCP neg, Bacterial, fungal, and AFB cultures NGTD  - Septic shock again on 3/6: febrile; CT: Persistent right upper lobe collapse/ consolidation with interval development of central airspace disease in the left upper lobe, restarted IV atx, antifungals, acyclovir.  Required vasopressors for several days.   -  3/11: PICC line out. Stopped all antibiotics and antifungals and all cx data was negative -  3/24: recurrent fever and hypoTN.  Did not require vasopressors.   -  UCx grew 6000 colonies of VRE  -  Blood cx NGTD  -  Continue linezolid and primaxin day 4 -  Micafungin started 3/25 by ID -  Appreciate ID assistance -  Start to wean stress dose steroids -  CXR:  Worsening aeration and no active infiltrates -  F/u CMV  3. Acute on chronic diastolic heart failure due to IVF from resuscitation. Echo (2/15): Preserved EF and grade 1 diastolic heart failure.  Dry weight of 77kg - R  sided pleural effusion; anasarca, prn IV lasix while sick - Daily weights and strict I/O; monitor renal function   - Per pulmonology deferred thoracentesis due to thrombocytopenia, R sided pleural effusion  -  Consider restarting lasix depending on results of AM CMP  4. 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 Acute Renal Failure due to septic shock; RUS wnl.   -  Awaiting AM CMP  6. Spontaneous retroperitoneal bleed in setting of thrombocytopenia; hemorrhage into her left iliopsoas muscle which caused weakness and pain of her left leg and LLQ  - on fentanyl; cont pain control  - continue OOB and PT/OT  7. Right upper extremity superficial thrombosis, stable   8. Transaminitis due to septic shock, resolved.   9. Urge and stress incontinence; Foley placed for comfort   10. Tongue ulcer; biopsied by ENT; path: SQUAMOUS EPITHELIUM. NO DYSPLASIA OR MALIGNANCY, improving  12. Drug-induced Rash: biopsied by surgery; Path:  Nonspecific findings, improving  13. Nausea/vomiting, Plain film xray without obstruction and having regular BMs    Severe protein calorie malnutrition, feeling like she could eat again -  D/c TPN after this bag complete and encourage PO intake  Diet:  Advance to soft mechanical Access:  port IVF:  yes Proph:  SCDs  Code Status: full, however, Dr. Marin Olp began conversation about code status with patient today Family Communication: patient and POA cathy Disposition Plan:  Transfer to telemetry    Consultants:  Dr. Burney Gauze (oncology)  Dr Gibson Ramp (Surgery) curbside consult  Dr. Carlyle Basques (infectious disease)  Procedures:  11/12/2013  right upper extremity Doppler  No evidence of deep vein thrombosis involving the right upper extremity, left subclavian vein, and left internal jugular vein. There is superficial thrombosis noted in the right cephalic vein.  Old PICC removed  3/5; Left-sided PICC 03/06 >>>  Foley 3/2 >>>    -2/12 Bone marrow biopsy report pending  Echocardiogram 01/10/2013  - Left ventricle: mild focal basal hypertrophy of the septum.  -LVEF= ejection fraction was 55%. Wall motion was normal; there were no regional wall motion abnormalities.  -(grade 1 diastolic dysfunction).  - Aortic valve: There was no stenosis. Trivial regurgitation. - Mitral valve: Trivial regurgitation. - Left atrium: The atrium was mildly dilated. - Right ventricle: The cavity size was normal. Systolic function was normal. - Tricuspid valve: Peak RV-RA gradient: 54mm Hg (S). - Pulmonary arteries: PA peak pressure: 66mm Hg (S). - Inferior vena cava: The vessel was normal in size; the respirophasic diameter changes were in the normal range (=50%); findings are consistent with normal central venous pressure.  2/15 Upper extremity Dopplers demonstrated acute clot of the right cephalic vein  6/23 Chest CT demonstrated loculated right pleural effusion which does not appear to have intact  2/17 Echocardiogram demonstrates EF of 76-28% with diastolic dysfunction.  2/24 Abdominal CT demonstrated hemorrhage in the left iliacus muscle  2/23 Chest CT demonstrated persistent right pleural effusion and right middle lobe density  2/25 Head CT demonstrated sphenoid sinusitis  2/26 Bronchoscopy did not reveal an infectious organism  3/3 Lower extremity Dopplers did not detect DVTs  3/6 Chest CT> persistent collapse of the RML, RLL with a large pleural effusion. Development of a new left airspace opacity.  3/8 TTE >>> Ef 65%, no RWMA, PAP 35 torr  3/8 Abdomen CT >>> No findings consistent with sepsis, evolution of L iliac hematoma  3/8 [place] R Effusion>>>small in volume, loculated & complex appearing by [place], NO THORA  Abx: linezolid 3/24 >> Imipenem 3/24 >> micafungin 3/25 >>  HPI/Subjective:  Patient states that she feels well.  Denies nausea.  Still coughing, but breathing not  labored.  Denies nausea altogether  Objective: Filed Vitals:   12/22/13 0000 12/22/13 0205 12/22/13 0403 12/22/13 0600  BP: 103/71 133/77  119/86  Pulse: 108 119  111  Temp: 99.1 F (37.3 C)  98.7 F (37.1 C)   TempSrc: Oral  Oral   Resp: $Remo'22 29  26  'BMInd$ Height:      Weight:   77.7 kg (171 lb 4.8 oz)   SpO2: 93% 94%  97%    Intake/Output Summary (Last 24 hours) at 12/22/13 0746 Last data filed at 12/22/13 0600  Gross per 24 hour  Intake   2895 ml  Output    450 ml  Net   2445 ml   Filed Weights   12/20/13 1200 12/21/13 0350 12/22/13 0403  Weight: 77.4 kg (170 lb 10.2 oz) 77.3 kg (170 lb 6.7 oz) 77.7 kg (171 lb 4.8 oz)    Exam:   General:  CF, No acute distress  HEENT:  NCAT, MMM  Cardiovascular:  RRR, nl S1, S2 no mrg, 2+ pulses, warm extremities  Respiratory:  rhonchorous BS, diminished at bilateral bases, no wheeze, no increased WOB  Abdomen:   NABS, soft, ND, mild TTP LLE  MSK:   Normal tone and bulk, trace LEE  Neuro:  Weakness LLE  Data Reviewed: Basic Metabolic Panel:  Recent Labs Lab 12/17/13 0540 12/18/13 0520 12/19/13 0430 12/20/13 0405 12/20/13 1520 12/21/13 0410  NA  135* 139 136* 136*  --  136*  K 3.3* 2.9* 2.9* 3.3*  --  2.8*  CL 100 103 101 99  --  100  CO2 23 24 23 22   --  22  GLUCOSE 114* 115* 131* 135*  --  213*  BUN 9 6 6 7   --  14  CREATININE 0.95 0.92 1.02 1.03  --  1.20*  CALCIUM 8.6 8.7 8.6 8.6  --  9.0  MG  --  1.6 1.5  --   --  1.9  PHOS  --   --   --   --  2.1* 2.0*   Liver Function Tests:  Recent Labs Lab 12/17/13 0540 12/18/13 0520 12/19/13 0430 12/20/13 0405 12/21/13 0410  AST 16 13 14 13 10   ALT 14 13 13 12 10   ALKPHOS 113 107 100 102 103  BILITOT 0.7 0.8 1.3* 1.4* 1.6*  PROT 6.4 6.5 6.3 6.6 7.1  ALBUMIN 2.3* 2.2* 2.1* 2.1* 2.1*    Recent Labs Lab 12/21/13 0430  LIPASE 48  AMYLASE 35   No results found for this basename: AMMONIA,  in the last 168 hours CBC:  Recent Labs Lab 12/18/13 0520  12/19/13 0430 12/20/13 0405 12/20/13 1115 12/21/13 0410 12/22/13 0600  WBC 8.4 11.7* 10.9* 10.9* 12.3* 4.7  NEUTROABS 5.1 8.1* 8.2*  --  9.8* 3.6  HGB 8.2* 7.8* 7.6* 8.0* 10.1* 15.0  HCT 24.9* 23.3* 22.9* 23.9* 30.5* 42.7  MCV 86.2 85.3 86.1 84.8 84.7 82.9  PLT 17* 14* 18* 20* 16* 10*   Cardiac Enzymes:  Recent Labs Lab 12/20/13 1115  TROPONINI <0.30   BNP (last 3 results)  Recent Labs  11/14/13 0435 11/17/13 0800 11/20/13 0515  PROBNP 2977.0* 9541.0* 3178.0*   CBG:  Recent Labs Lab 12/21/13 0751 12/21/13 1141 12/21/13 1641 12/21/13 1925 12/21/13 2354  GLUCAP 164* 148* 150* 152* 148*    Recent Results (from the past 240 hour(s))  URINE CULTURE     Status: None   Collection Time    12/15/13 10:30 AM      Result Value Ref Range Status   Specimen Description URINE, CATHETERIZED   Final   Special Requests Immunocompromised   Final   Culture  Setup Time     Final   Value: 12/15/2013 13:04     Performed at [facility] Count     Final   Value: 6,000 COLONIES/ML     Performed at [facility]   Culture     Final   Value: VANCOMYCIN RESISTANT ENTEROCOCCUS ISOLATED     Note: CRITICAL RESULT CALLED TO, READ BACK BY AND VERIFIED WITH: KRISTEN [email or web address]  ON [id number] BY [facility]     Performed at [facility]   Report Status 12/19/2013 FINAL   Final   Organism ID, Bacteria VANCOMYCIN RESISTANT ENTEROCOCCUS ISOLATED   Final  CULTURE, BLOOD (ROUTINE X 2)     Status: None   Collection Time    12/19/13  8:10 AM      Result Value Ref Range Status   Specimen Description BLOOD RIGHT ARM   Final   Special Requests BOTTLES DRAWN AEROBIC ONLY 1 CC   Final   Culture  Setup Time     Final   Value: 12/19/2013 12:14     Performed at [facility]   Culture     Final   Value:        BLOOD CULTURE RECEIVED NO GROWTH TO DATE  CULTURE WILL BE HELD FOR 5 DAYS BEFORE ISSUING A FINAL NEGATIVE REPORT     Performed at [facility]    Report Status PENDING   Incomplete  CULTURE, BLOOD (ROUTINE X 2)     Status: None   Collection Time    12/19/13  8:20 AM      Result Value Ref Range Status   Specimen Description BLOOD RIGHT ARM   Final   Special Requests BOTTLES DRAWN AEROBIC AND ANAEROBIC 2 CC EACH   Final   Culture  Setup Time     Final   Value: 12/19/2013 12:14     Performed at [facility]   Culture     Final   Value:        BLOOD CULTURE RECEIVED NO GROWTH TO DATE CULTURE WILL BE HELD FOR 5 DAYS BEFORE ISSUING A FINAL NEGATIVE REPORT     Performed at [facility]   Report Status PENDING   Incomplete  WOUND CULTURE     Status: None   Collection Time    12/21/13 10:35 AM      Result Value Ref Range Status   Specimen Description ORAL ULCER LT SIDE ON TONGUE   Final   Special Requests Immunocompromised   Final   Gram Stain     Final   Value: NO WBC SEEN     NO SQUAMOUS EPITHELIAL CELLS SEEN     RARE GRAM POSITIVE RODS     FEW YEAST     Performed at [facility]   Culture     Final   Value: NO GROWTH     Performed at [facility]   Report Status PENDING   Incomplete  CLOSTRIDIUM DIFFICILE BY PCR     Status: None   Collection Time    12/21/13  1:08 PM      Result Value Ref Range Status   C difficile by pcr NEGATIVE  NEGATIVE Final   Comment: Performed at [facility]     Studies: Dg Chest Port 1 View  12/20/2013   CLINICAL DATA:  Increasing shortness of breath.  EXAM: PORTABLE CHEST - 1 VIEW  COMPARISON:  DG CHEST 1V PORT dated 12/19/2013; DG CHEST 2 VIEW dated 12/14/2013  FINDINGS: Low lung volumes. Increased cardiomediastinal silhouette. Continued fluid in the minor fissure with small bilateral pleural effusions blunting the costophrenic angles. PICC line tip cavoatrial junction.  IMPRESSION: Slight worsening aeration with low lung volumes. No active infiltrates or failure.   Electronically Signed   By: Rolla Flatten M.D.   On: 12/20/2013 11:06    Scheduled Meds: .  DULoxetine  60 mg Oral QPM  . feeding supplement (ENSURE COMPLETE)  237 mL Oral TID WC  . fluticasone  2 spray Each Nare Daily  . hydrocortisone  10 mg Oral BID  . insulin aspart  0-9 Units Subcutaneous 6 times per day  . linezolid  600 mg Intravenous Q12H  . metoCLOPramide (REGLAN) injection  5 mg Intravenous Q12H  . micafungin (MYCAMINE) IV  100 mg Intravenous Q24H  . multivitamin with minerals  1 tablet Oral Daily  . pantoprazole  40 mg Oral Daily  . sodium chloride  10-40 mL Intracatheter Q12H  . sodium chloride  3 mL Intravenous Q12H  . triamcinolone cream   Topical TID  . valGANciclovir  450 mg Oral Daily   Continuous Infusions: . sodium chloride 20 mL/hr at 12/20/13 1800    Active Problems:  Myelodysplasia   Symptomatic anemia   Adrenal infarction   Acute renal failure   Acute on chronic diastolic heart failure   Altered mental status   Acute-on-chronic respiratory failure   Hypotension, unspecified   Severe sepsis   DVT (deep venous thrombosis)   Pleural effusion   Thrombocytopenia   Hypokalemia    Time spent: 30 min    Aidyn Kellis, [facility]  [facility] Pager [phone]. If 7PM-7AM, please contact night-coverage at [email or web address], password [facility] 12/22/2013, 7:46 AM  LOS: 45 days

## 2013-12-22 NOTE — Progress Notes (Signed)
PHARMACY BRIEF NOTE:  PROTONIX IV TO PO  The patient is receiving Protonix by the intravenous route.  Based on criteria approved by the Pharmacy and Therapeutics Committee the medication is being converted to the equivalent oral dose form.  If you have any questions about this conversion, please contact the Pharmacy Department (phone [phone]).  Thank you.  Clayburn Pert, PharmD, BCPS Pager: [phone] 12/22/2013  6:17 AM

## 2013-12-23 ENCOUNTER — Inpatient Hospital Stay (HOSPITAL_COMMUNITY): Payer: BC Managed Care – PPO

## 2013-12-23 DIAGNOSIS — H6693 Otitis media, unspecified, bilateral: Secondary | ICD-10-CM

## 2013-12-23 DIAGNOSIS — R110 Nausea: Secondary | ICD-10-CM

## 2013-12-23 DIAGNOSIS — H6690 Otitis media, unspecified, unspecified ear: Secondary | ICD-10-CM

## 2013-12-23 LAB — CBC WITH DIFFERENTIAL/PLATELET
BASOS ABS: 0.1 10*3/uL (ref 0.0–0.1)
Basophils Relative: 1 % (ref 0–1)
EOS ABS: 0.1 10*3/uL (ref 0.0–0.7)
Eosinophils Relative: 1 % (ref 0–5)
HEMATOCRIT: 29.9 % — AB (ref 36.0–46.0)
Hemoglobin: 9.7 g/dL — ABNORMAL LOW (ref 12.0–15.0)
LYMPHS PCT: 22 % (ref 12–46)
Lymphs Abs: 1.3 10*3/uL (ref 0.7–4.0)
MCH: 28 pg (ref 26.0–34.0)
MCHC: 32.4 g/dL (ref 30.0–36.0)
MCV: 86.2 fL (ref 78.0–100.0)
MONOS PCT: 9 % (ref 3–12)
Monocytes Absolute: 0.5 10*3/uL (ref 0.1–1.0)
NEUTROS ABS: 3.8 10*3/uL (ref 1.7–7.7)
Neutrophils Relative %: 67 % (ref 43–77)
Platelets: 52 10*3/uL — ABNORMAL LOW (ref 150–400)
RBC: 3.47 MIL/uL — AB (ref 3.87–5.11)
RDW: 16.4 % — AB (ref 11.5–15.5)
WBC: 5.8 10*3/uL (ref 4.0–10.5)

## 2013-12-23 LAB — COMPREHENSIVE METABOLIC PANEL
ALBUMIN: 2.1 g/dL — AB (ref 3.5–5.2)
ALT: 34 U/L (ref 0–35)
AST: 39 U/L — AB (ref 0–37)
Alkaline Phosphatase: 126 U/L — ABNORMAL HIGH (ref 39–117)
BILIRUBIN TOTAL: 1.2 mg/dL (ref 0.3–1.2)
BUN: 29 mg/dL — ABNORMAL HIGH (ref 6–23)
CHLORIDE: 109 meq/L (ref 96–112)
CO2: 20 mEq/L (ref 19–32)
Calcium: 8.9 mg/dL (ref 8.4–10.5)
Creatinine, Ser: 0.97 mg/dL (ref 0.50–1.10)
GFR calc Af Amer: 73 mL/min — ABNORMAL LOW (ref >90)
GFR calc non Af Amer: 63 mL/min — ABNORMAL LOW (ref >90)
Glucose, Bld: 80 mg/dL (ref 70–99)
POTASSIUM: 3.2 meq/L — AB (ref 3.7–5.3)
SODIUM: 144 meq/L (ref 137–147)
Total Protein: 6.6 g/dL (ref 6.0–8.3)

## 2013-12-23 LAB — WOUND CULTURE: GRAM STAIN: NONE SEEN

## 2013-12-23 LAB — PREPARE PLATELET PHERESIS: Unit division: 0

## 2013-12-23 LAB — RETICULOCYTES
RBC.: 3.47 MIL/uL — AB (ref 3.87–5.11)
Retic Ct Pct: <0.4 % — ABNORMAL LOW (ref 0.4–3.1)

## 2013-12-23 LAB — GLUCOSE, CAPILLARY
Glucose-Capillary: 78 mg/dL (ref 70–99)
Glucose-Capillary: 96 mg/dL (ref 70–99)

## 2013-12-23 MED ORDER — OXYMETAZOLINE HCL 0.05 % NA SOLN
1.0000 | Freq: Two times a day (BID) | NASAL | Status: DC
  Administered 2013-12-23: 1 via NASAL
  Filled 2013-12-23: qty 15

## 2013-12-23 MED ORDER — DIPHENHYDRAMINE HCL 50 MG/ML IJ SOLN: 25.0000 mg | Freq: Four times a day (QID) | INTRAMUSCULAR | Status: DC | PRN

## 2013-12-23 MED ORDER — FUROSEMIDE 10 MG/ML IJ SOLN
INTRAMUSCULAR | Status: AC
  Administered 2013-12-23: 20 mg via INTRAVENOUS
  Filled 2013-12-23: qty 4

## 2013-12-23 MED ORDER — FUROSEMIDE 10 MG/ML IJ SOLN
20.0000 mg | Freq: Once | INTRAMUSCULAR | Status: AC
  Administered 2013-12-23: 20 mg via INTRAVENOUS
  Filled 2013-12-23: qty 2

## 2013-12-23 MED ORDER — HYDROCORTISONE NA SUCCINATE PF 100 MG IJ SOLR
10.0000 mg | Freq: Two times a day (BID) | INTRAMUSCULAR | Status: DC
  Administered 2013-12-23 – 2013-12-24 (×2): 10 mg via INTRAVENOUS
  Filled 2013-12-23 (×4): qty 0.2

## 2013-12-23 MED ORDER — ARIPIPRAZOLE 2 MG PO TABS
2.0000 mg | ORAL_TABLET | Freq: Every day | ORAL | Status: DC
  Administered 2013-12-23 – 2013-12-24 (×2): 2 mg via ORAL
  Filled 2013-12-23 (×2): qty 1

## 2013-12-23 MED ORDER — POTASSIUM CHLORIDE 10 MEQ/100ML IV SOLN
10.0000 meq | INTRAVENOUS | Status: AC
  Administered 2013-12-23 (×6): 10 meq via INTRAVENOUS
  Filled 2013-12-23 (×6): qty 100

## 2013-12-23 MED ORDER — LEVALBUTEROL HCL 1.25 MG/0.5ML IN NEBU
1.2500 mg | INHALATION_SOLUTION | Freq: Three times a day (TID) | RESPIRATORY_TRACT | Status: DC
  Administered 2013-12-23 – 2013-12-24 (×3): 1.25 mg via RESPIRATORY_TRACT
  Filled 2013-12-23 (×8): qty 0.5

## 2013-12-23 MED ORDER — FAT EMULSION 20 % IV EMUL
250.0000 mL | INTRAVENOUS | Status: AC
  Administered 2013-12-23: 250 mL via INTRAVENOUS
  Filled 2013-12-23: qty 250

## 2013-12-23 MED ORDER — INSULIN ASPART 100 UNIT/ML ~~LOC~~ SOLN
0.0000 [IU] | SUBCUTANEOUS | Status: DC
  Administered 2013-12-24 (×4): 1 [IU] via SUBCUTANEOUS

## 2013-12-23 MED ORDER — M.V.I. ADULT IV INJ
INTRAVENOUS | Status: AC
  Administered 2013-12-23: 17:00:00 via INTRAVENOUS
  Filled 2013-12-23: qty 1000

## 2013-12-23 MED ORDER — ANTIPYRINE-BENZOCAINE 5.4-1.4 % OT SOLN
3.0000 [drp] | OTIC | Status: DC | PRN
  Administered 2013-12-23 – 2013-12-24 (×3): 3 [drp] via OTIC
  Filled 2013-12-23: qty 10

## 2013-12-23 MED ORDER — HYDROMORPHONE HCL PF 1 MG/ML IJ SOLN
1.0000 mg | INTRAMUSCULAR | Status: DC | PRN
  Administered 2013-12-23: 1 mg via INTRAVENOUS
  Filled 2013-12-23: qty 1

## 2013-12-23 MED ORDER — BENZONATATE 100 MG PO CAPS
100.0000 mg | ORAL_CAPSULE | Freq: Three times a day (TID) | ORAL | Status: DC
  Administered 2013-12-23 – 2013-12-24 (×4): 100 mg via ORAL
  Filled 2013-12-23 (×9): qty 1

## 2013-12-23 MED ORDER — POTASSIUM CHLORIDE CRYS ER 20 MEQ PO TBCR
40.0000 meq | EXTENDED_RELEASE_TABLET | Freq: Two times a day (BID) | ORAL | Status: DC
  Filled 2013-12-23 (×2): qty 2

## 2013-12-23 MED ORDER — SALINE SPRAY 0.65 % NA SOLN: 1.0000 | NASAL | Status: DC | PRN

## 2013-12-23 NOTE — Progress Notes (Signed)
Pt has had multiple orange/brown watery stools this shift as well as multiple episodes of vomiting, and has needed linen changes frequently. At 0430 this RN helped pt to bed pan and noticed bed pad had a large red stain on it. Stool and urine at this time was not red-tinged or dark colored. Pt is post-menopausal.  Jonette Eva, NP notified. Will continue to monitor pt closely. Carnella [place] I

## 2013-12-23 NOTE — Progress Notes (Signed)
PARENTERAL NUTRITION CONSULT NOTE - FOLLOW UP  Pharmacy Consult for TNA (resume 3/28) Indication: Vomiting, no PO intake < 7 days  Allergies  Allergen Reactions  . Penicillins Hives and Rash  . Contrast Media [Iodinated Diagnostic Agents]    Patient Measurements: Height: 5' (152.4 cm) Weight: 171 lb 4.8 oz (77.7 kg) IBW/kg (Calculated) : 45.5 Usual Weight: 76.7kg  Vital Signs: Temp: 97.4 F (36.3 C) (03/28 1442) Temp src: Oral (03/28 1442) BP: 129/68 mmHg (03/28 1442) Pulse Rate: 134 (03/28 1442) Intake/Output from previous day: 03/27 0701 - 03/28 0700 In: 1780.2 [P.O.:402; I.V.:480; Blood:318.2; IV Piggyback:500; TPN:80] Out: -  Intake/Output from this shift: Total I/O In: 820 [P.O.:720; IV Piggyback:100] Out: 300 [Urine:300]  Labs:  Recent Labs  12/21/13 0410 12/22/13 0600 12/23/13 0453  WBC 12.3* 4.7 5.8  HGB 10.1* 15.0 9.7*  HCT 30.5* 42.7 29.9*  PLT 16* 10* 52*    Recent Labs  12/20/13 1520 12/21/13 0405 12/21/13 0410 12/22/13 0600 12/23/13 0453  NA  --   --  136* 137 144  K  --   --  2.8* 3.4* 3.2*  CL  --   --  100 101 109  CO2  --   --  22 20 20   GLUCOSE  --   --  213* 184* 80  BUN  --   --  14 26* 29*  CREATININE  --   --  1.20* 1.05 0.97  CALCIUM  --   --  9.0 8.8 8.9  MG  --   --  1.9 2.0  --   PHOS 2.1*  --  2.0* 3.8  --   PROT  --   --  7.1 6.9 6.6  ALBUMIN  --   --  2.1* 2.0* 2.1*  AST  --   --  10 15 39*  ALT  --   --  10 12 34  ALKPHOS  --   --  103 94 126*  BILITOT  --   --  1.6* 0.9 1.2  PREALBUMIN  --   --  4.5*  --   --   TRIG  --  131  --   --   --    Estimated Creatinine Clearance: 57.6 ml/min (by C-G formula based on Cr of 0.97).   Recent Labs  12/21/13 2354 12/22/13 0347 12/22/13 0808  GLUCAP 148* 149* 138*   Insulin Requirements in the past 24 hours:  No insulin orders with stop of TPN 3/27  Current Nutrition:  Ensure complete TID with meals - charted once 3/24 & 3/26 Patient to increase oral  intake  Nutritional Goals:  RD recs: Kcal: 1300-1500, Protein: 55-70g, Fluid: 1.3-1.5L/day  Clinimix E5/15 at a goal rate of 88ml/hr + IVFE 20% at 90ml/hr to provide: 78g/day protein, 1588Kcal/day avg   [facility]F: [facility] at [facility]  Lines/Drains/Airways:  PICC triple lumen left basilic (placed 09/30/06)   Assessment:  Glucose CBG's < 150mg /dl, on hydrocortisone 10mg  IV q12h Electrolytes - Na 144K = 3.2 (replaced), on 3/27: Mg = 2, Corr Ca = 10  Renal: SCr = 0.97, CrCl ~9ml/min LFTs - Tbili improved to WNL 3/27 TGs - 131 (3/26)  Prealbumin -4.5 (3/26) -represents severely depleted protein stores  55 yoF with hx Hodgkin's lymphoma and MDS admitted since 2/10 with sepsis complicated with adrenal infarct, adrenal insuffiency, and HCAP. Pt started treatment Heparin to lovenox for superficial thrombus in R cephalic vein and hypercoaguable state, however developed hemorrahge into her left iliopsoas muscle and has  remained off anticoagulation since 2/24. Pt completed 1 cycle of vidaza (2 doses held) 3/11 - 3/17 for MDS however has refractory anemia and thrombocytopenia requiring almost daily blood product transfusions. Antibiotics restarted 3/24 for VRE urine infection. Pt also with progressive rash s/p skin biopsy, nonspecific results. On 3/25: Pharmacy consulted to start TPN for persistent vomiting and no oral intake since 3/18. Spoke with Dr. Jonette Eva about possible trial of tube feeds, however he wishes to proceed with TPN tonight. RD consult completed yesterday.   Plan:   Orders to resume TPN tonight, will resume Clinimix E5/15 at 11ml/hr  Will not add insulin (10 units/1L) as per previous bag 3/26 as hydrocortisone dose reduced from 50mg  IV q6h to 10mg  q12h.   20% lipids 33ml/hr  Resume sensitive SSI and CBGs q4h  On PO multivitamin - refused dose this am so will add MVI to TPN tonight  Evaluate need for TE MWF if continues to refuse PO MVI  Labs in am  Doreene Eland, PharmD, BCPS.   Pager:  [phone] 12/23/2013, 3:06 PM

## 2013-12-23 NOTE — Progress Notes (Addendum)
INFECTIOUS DISEASE PROGRESS NOTE  ID: Lacey [name] is a 58 y.o. female with  Active Problems:   Myelodysplasia   Symptomatic anemia   Adrenal infarction   Acute renal failure   Acute on chronic diastolic heart failure   Altered mental status   Acute-on-chronic respiratory failure   Hypotension, unspecified   Severe sepsis   DVT (deep venous thrombosis)   Pleural effusion   Thrombocytopenia   Hypokalemia   Bilateral otitis media  Subjective: Severe, watery, amber diarrhea  Abtx:  Anti-infectives   Start     Dose/Rate Route Frequency Ordered Stop   12/23/13 0000  imipenem-cilastatin (PRIMAXIN) 500 mg in sodium chloride 0.9 % 100 mL IVPB     500 mg 200 mL/hr over 30 Minutes Intravenous Every 8 hours 12/22/13 1638     12/22/13 1800  micafungin (MYCAMINE) 100 mg in sodium chloride 0.9 % 100 mL IVPB     100 mg 100 mL/hr over 1 Hours Intravenous Every 24 hours 12/22/13 1623     12/22/13 1700  imipenem-cilastatin (PRIMAXIN) 500 mg in sodium chloride 0.9 % 100 mL IVPB     500 mg 200 mL/hr over 30 Minutes Intravenous  Once 12/22/13 1636 12/22/13 1910   12/21/13 1000  valGANciclovir (VALCYTE) 450 MG tablet TABS 450 mg     450 mg Oral Daily 12/21/13 0759     12/20/13 1700  micafungin (MYCAMINE) 100 mg in sodium chloride 0.9 % 100 mL IVPB  Status:  Discontinued     100 mg 100 mL/hr over 1 Hours Intravenous Every 24 hours 12/20/13 1634 12/22/13 0749   12/19/13 1200  imipenem-cilastatin (PRIMAXIN) 500 mg in sodium chloride 0.9 % 100 mL IVPB  Status:  Discontinued     500 mg 200 mL/hr over 30 Minutes Intravenous 3 times per day 12/19/13 1025 12/22/13 0737   12/19/13 1030  linezolid (ZYVOX) IVPB 600 mg  Status:  Discontinued     600 mg 300 mL/hr over 60 Minutes Intravenous Every 12 hours 12/19/13 1019 12/22/13 1623   12/02/13 2230  ceFEPIme (MAXIPIME) 1 g in dextrose 5 % 50 mL IVPB  Status:  Discontinued     1 g 100 mL/hr over 30 Minutes Intravenous Every 12 hours 12/02/13  2223 12/06/13 1724   12/02/13 2200  linezolid (ZYVOX) IVPB 600 mg  Status:  Discontinued     600 mg 300 mL/hr over 60 Minutes Intravenous Every 12 hours 12/02/13 2145 12/06/13 1724   12/02/13 2200  micafungin (MYCAMINE) 100 mg in sodium chloride 0.9 % 100 mL IVPB  Status:  Discontinued     100 mg 100 mL/hr over 1 Hours Intravenous Every 24 hours 12/02/13 2145 12/06/13 1724   12/02/13 1200  vancomycin (VANCOCIN) 1,250 mg in sodium chloride 0.9 % 250 mL IVPB  Status:  Discontinued     1,250 mg 166.7 mL/hr over 90 Minutes Intravenous Every 24 hours 12/01/13 0802 12/02/13 2115   12/01/13 2200  ceFEPIme (MAXIPIME) 1 g in dextrose 5 % 50 mL IVPB  Status:  Discontinued     1 g 100 mL/hr over 30 Minutes Intravenous Every 12 hours 12/01/13 2116 12/02/13 2115   12/01/13 0915  ceFEPIme (MAXIPIME) 2 g in dextrose 5 % 50 mL IVPB     2 g 100 mL/hr over 30 Minutes Intravenous STAT 12/01/13 0904 12/01/13 1111   12/01/13 0800  vancomycin (VANCOCIN) 2,000 mg in sodium chloride 0.9 % 500 mL IVPB     2,000 mg 250 mL/hr  over 120 Minutes Intravenous  Once 12/01/13 0745 12/01/13 1241   11/30/13 1000  fluconazole (DIFLUCAN) IVPB 200 mg  Status:  Discontinued     200 mg 100 mL/hr over 60 Minutes Intravenous Every 24 hours 11/30/13 0752 12/02/13 2115   11/30/13 0900  acyclovir (ZOVIRAX) 230 mg in dextrose 5 % 100 mL IVPB  Status:  Discontinued     5 mg/kg  45.5 kg (Ideal) 104.6 mL/hr over 60 Minutes Intravenous Every 8 hours 11/30/13 0752 12/06/13 1724   11/25/13 1800  ceFEPIme (MAXIPIME) 2 g in dextrose 5 % 50 mL IVPB  Status:  Discontinued     2 g 100 mL/hr over 30 Minutes Intravenous Every 24 hours 11/25/13 1243 11/27/13 0750   11/24/13 0800  vancomycin (VANCOCIN) 1,250 mg in sodium chloride 0.9 % 250 mL IVPB  Status:  Discontinued     1,250 mg 166.7 mL/hr over 90 Minutes Intravenous Every 24 hours 11/24/13 0706 11/27/13 0740   10/29/2013 1800  ceFEPIme (MAXIPIME) 1 g in dextrose 5 % 50 mL IVPB  Status:   Discontinued     1 g 100 mL/hr over 30 Minutes Intravenous Every 24 hours 11/22/13 1944 11/25/13 1243   11/17/2013 1000  voriconazole (VFEND) tablet 200 mg  Status:  Discontinued     200 mg Oral Every 12 hours 11/14/2013 0842 11/25/13 1214   11/25/2013 0800  vancomycin (VANCOCIN) 1,250 mg in sodium chloride 0.9 % 250 mL IVPB     1,250 mg 166.7 mL/hr over 90 Minutes Intravenous  Once 11/15/2013 0618 10/29/2013 0930   11/22/13 2000  vancomycin (VANCOCIN) 1,250 mg in sodium chloride 0.9 % 250 mL IVPB  Status:  Discontinued     1,250 mg 166.7 mL/hr over 90 Minutes Intravenous Every 24 hours 11/22/13 1720 11/22/13 1926   11/22/13 1800  ceFEPIme (MAXIPIME) 2 g in dextrose 5 % 50 mL IVPB  Status:  Discontinued     2 g 100 mL/hr over 30 Minutes Intravenous Every 24 hours 11/22/13 1720 11/22/13 1944   11/22/13 1000  acyclovir (ZOVIRAX) 200 MG capsule 800 mg  Status:  Discontinued     800 mg Oral 3 times daily 11/22/13 0714 11/22/13 0727   11/22/13 1000  acyclovir (ZOVIRAX) tablet 800 mg  Status:  Discontinued     800 mg Oral 3 times daily 11/22/13 0727 11/22/13 1942   11/18/13 1000  acyclovir (ZOVIRAX) 200 MG capsule 400 mg  Status:  Discontinued     400 mg Oral 3 times daily 11/18/13 0734 11/19/13 0804   11/18/13 1000  fluconazole (DIFLUCAN) tablet 100 mg  Status:  Discontinued     100 mg Oral Daily 11/18/13 0734 11/11/2013 0807   11/16/13 1200  imipenem-cilastatin (PRIMAXIN) 500 mg in sodium chloride 0.9 % 100 mL IVPB  Status:  Discontinued     500 mg 200 mL/hr over 30 Minutes Intravenous Every 8 hours 11/16/13 0731 11/18/13 0734   11/15/13 1000  vancomycin (VANCOCIN) 1,250 mg in sodium chloride 0.9 % 250 mL IVPB     1,250 mg 166.7 mL/hr over 90 Minutes Intravenous Every 24 hours 11/14/13 0852 11/21/13 1125   11/14/13 1600  imipenem-cilastatin (PRIMAXIN) 250 mg in sodium chloride 0.9 % 100 mL IVPB  Status:  Discontinued     250 mg 200 mL/hr over 30 Minutes Intravenous 4 times per day 11/14/13 0856  11/16/13 0731   11/14/13 1000  voriconazole (VFEND) 360 mg in sodium chloride 0.9 % 150 mL IVPB  Status:  Discontinued  4 mg/kg  89.3 kg 93 mL/hr over 120 Minutes Intravenous Every 12 hours 11/13/13 0806 11/15/13 0807   11/14/13 1000  acyclovir (ZOVIRAX) 230 mg in dextrose 5 % 100 mL IVPB  Status:  Discontinued     5 mg/kg  45.5 kg (Ideal) 104.6 mL/hr over 60 Minutes Intravenous Every 12 hours 11/14/13 0840 11/17/13 0714   11/14/13 0900  vancomycin (VANCOCIN) 2,000 mg in sodium chloride 0.9 % 500 mL IVPB     2,000 mg 250 mL/hr over 120 Minutes Intravenous  Once 11/14/13 0851 11/14/13 1456   11/14/13 0745  acyclovir (ZOVIRAX) 485 mg in dextrose 5 % 100 mL IVPB  Status:  Discontinued     5 mg/kg  96.6 kg 109.7 mL/hr over 60 Minutes Intravenous 3 times per day 11/14/13 0742 11/14/13 0839   11/13/13 1600  imipenem-cilastatin (PRIMAXIN) 500 mg in sodium chloride 0.9 % 100 mL IVPB  Status:  Discontinued     500 mg 200 mL/hr over 30 Minutes Intravenous Every 8 hours 11/13/13 0752 11/14/13 0856   11/13/13 0900  voriconazole (VFEND) 540 mg in sodium chloride 0.9 % 150 mL IVPB     6 mg/kg  89.3 kg 102 mL/hr over 120 Minutes Intravenous Every 12 hours 11/13/13 0806 11/14/13 0110   11/13/13 0830  imipenem-cilastatin (PRIMAXIN) 500 mg in sodium chloride 0.9 % 100 mL IVPB     500 mg 200 mL/hr over 30 Minutes Intravenous STAT 11/13/13 0750 11/13/13 1127   11/13/13 0730  ceFEPIme (MAXIPIME) 1 g in dextrose 5 % 50 mL IVPB  Status:  Discontinued     1 g 100 mL/hr over 30 Minutes Intravenous 3 times per day 11/13/13 0718 11/13/13 0750   11/10/13 2000  vancomycin (VANCOCIN) 500 mg in sodium chloride 0.9 % 100 mL IVPB  Status:  Discontinued     500 mg 100 mL/hr over 60 Minutes Intravenous Every 12 hours 11/10/13 1824 11/12/13 1500   11/08/13 1800  vancomycin (VANCOCIN) IVPB 750 mg/150 ml premix  Status:  Discontinued     750 mg 150 mL/hr over 60 Minutes Intravenous Every 12 hours 11/08/13 0307  11/10/13 1824   11/08/13 1000  ciprofloxacin (CIPRO) IVPB 400 mg  Status:  Discontinued     400 mg 200 mL/hr over 60 Minutes Intravenous Every 12 hours 11/06/2013 2325 11/11/13 0812   11/08/13 0600  aztreonam (AZACTAM) 1 g in dextrose 5 % 50 mL IVPB  Status:  Discontinued     1 g 100 mL/hr over 30 Minutes Intravenous 3 times per day 11/08/13 0307 11/10/13 1356   11/08/13 0030  vancomycin (VANCOCIN) IVPB 1000 mg/200 mL premix     1,000 mg 200 mL/hr over 60 Minutes Intravenous  Once 11/08/13 0020 11/08/13 0354   11/08/13 0000  aztreonam (AZACTAM) 2 g in dextrose 5 % 50 mL IVPB     2 g 100 mL/hr over 30 Minutes Intravenous  Once 11/17/2013 2357 11/08/13 0151   11/12/2013 2130  ciprofloxacin (CIPRO) IVPB 400 mg     400 mg 200 mL/hr over 60 Minutes Intravenous  Once 11/08/2013 2111 11/04/2013 2325      Medications:  Scheduled: . ARIPiprazole  2 mg Oral Daily  . benzonatate  100 mg Oral TID  . DULoxetine  60 mg Oral QPM  . feeding supplement (ENSURE COMPLETE)  237 mL Oral TID WC  . fluticasone  2 spray Each Nare Daily  . hydrocortisone sod succinate (SOLU-CORTEF) inj  10 mg Intravenous BID WC  .  imipenem-cilastatin  500 mg Intravenous Q8H  . insulin aspart  0-9 Units Subcutaneous 6 times per day  . levalbuterol  1.25 mg Nebulization TID  . micafungin [facility]) IV  100 mg Intravenous Q24H  . multivitamin with minerals  1 tablet Oral Daily  . oxymetazoline  1 spray Each Nare BID  . potassium chloride  10 mEq Intravenous Q1 Hr x 6  . saccharomyces boulardii  250 mg Oral BID  . sodium chloride  10-40 mL Intracatheter Q12H  . sodium chloride  10-40 mL Intracatheter Q12H  . sodium chloride  3 mL Intravenous Q12H  . valGANciclovir  450 mg Oral Daily    Objective: Vital signs in last 24 hours: Temp:  [97.4 F (36.3 C)-99.2 F (37.3 C)] 97.4 F (36.3 C) (03/28 1442) Pulse Rate:  [92-134] 134 (03/28 1442) Resp:  [18-22] 18 (03/28 1122) BP: (114-139)/(68-85) 129/68 mmHg (03/28 1442) SpO2:   [95 %-100 %] 98 % (03/28 1442)   General appearance: alert, cooperative and mild distress Resp: clear to auscultation bilaterally Cardio: regular rate and rhythm GI: normal findings: bowel sounds normal and soft, non-tender  Lab Results  Recent Labs  12/22/13 0600 12/23/13 0453  WBC 4.7 5.8  HGB 15.0 9.7*  HCT 42.7 29.9*  NA 137 144  K 3.4* 3.2*  CL 101 109  CO2 20 20  BUN 26* 29*  CREATININE 1.05 0.97   Liver Panel  Recent Labs  12/22/13 0600 12/23/13 0453  PROT 6.9 6.6  ALBUMIN 2.0* 2.1*  AST 15 39*  ALT 12 34  ALKPHOS 94 126*  BILITOT 0.9 1.2   Sedimentation Rate No results found for this basename: ESRSEDRATE,  in the last 72 hours C-Reactive Protein No results found for this basename: CRP,  in the last 72 hours  Microbiology: Recent Results (from the past 240 hour(s))  URINE CULTURE     Status: None   Collection Time    12/15/13 10:30 AM      Result Value Ref Range Status   Specimen Description URINE, CATHETERIZED   Final   Special Requests Immunocompromised   Final   Culture  Setup Time     Final   Value: 12/15/2013 13:04     Performed at [place]     Final   Value: 6,000 COLONIES/ML     Performed at [facility]   Culture     Final   Value: VANCOMYCIN RESISTANT ENTEROCOCCUS ISOLATED     Note: CRITICAL RESULT CALLED TO, READ BACK BY AND VERIFIED WITH: [email or web address]  ON [id number] BY [facility]     Performed at [facility]   Report Status 12/19/2013 FINAL   Final   Organism ID, Bacteria VANCOMYCIN RESISTANT ENTEROCOCCUS ISOLATED   Final  CULTURE, BLOOD (ROUTINE X 2)     Status: None   Collection Time    12/19/13  8:10 AM      Result Value Ref Range Status   Specimen Description BLOOD RIGHT ARM   Final   Special Requests BOTTLES DRAWN AEROBIC ONLY 1 CC   Final   Culture  Setup Time     Final   Value: 12/19/2013 12:14     Performed at [facility]   Culture     Final   Value:        BLOOD  CULTURE RECEIVED NO GROWTH TO DATE CULTURE WILL BE HELD FOR 5 DAYS BEFORE ISSUING A FINAL NEGATIVE REPORT  Performed at [facility]   Report Status PENDING   Incomplete  CULTURE, BLOOD (ROUTINE X 2)     Status: None   Collection Time    12/19/13  8:20 AM      Result Value Ref Range Status   Specimen Description BLOOD RIGHT ARM   Final   Special Requests BOTTLES DRAWN AEROBIC AND ANAEROBIC 2 CC EACH   Final   Culture  Setup Time     Final   Value: 12/19/2013 12:14     Performed at [facility]   Culture     Final   Value:        BLOOD CULTURE RECEIVED NO GROWTH TO DATE CULTURE WILL BE HELD FOR 5 DAYS BEFORE ISSUING A FINAL NEGATIVE REPORT     Performed at [facility]   Report Status PENDING   Incomplete  WOUND CULTURE     Status: None   Collection Time    12/21/13 10:35 AM      Result Value Ref Range Status   Specimen Description ORAL ULCER LT SIDE ON TONGUE   Final   Special Requests Immunocompromised   Final   Gram Stain     Final   Value: NO WBC SEEN     NO SQUAMOUS EPITHELIAL CELLS SEEN     RARE GRAM POSITIVE RODS     FEW YEAST     Performed at [facility]   Culture     Final   Value: MULTIPLE ORGANISMS PRESENT, NONE PREDOMINANT     Note: NO STAPHYLOCOCCUS AUREUS ISOLATED NO GROUP A STREP (S.PYOGENES) ISOLATED     Performed at [facility]   Report Status 12/23/2013 FINAL   Final  CLOSTRIDIUM DIFFICILE BY PCR     Status: None   Collection Time    12/21/13  1:08 PM      Result Value Ref Range Status   C difficile by pcr NEGATIVE  NEGATIVE Final   Comment: Performed at [place]     Status: None   Collection Time    12/21/13  1:31 PM      Result Value Ref Range Status   Specimen Description OTHER TONGUE   Final   Special Requests Immunocompromised   Final   Culture     Final   Value: Culture has been initiated.     Note:    The current Enterovirus culture system does not detect Enterovirus D68.  If Enterovirus D68 is suspected, please call client services to add the test for Enterovirus PCR (Test code [phone]).     Performed at [facility]   Report Status PENDING   Incomplete    Studies/Results: No results found.   Assessment/Plan: MDS  Hodgkin's lymphoma  Fever- resolved for now Thrombocytopenia- better today UCx VRE (6k) otitis  Total days of antibiotics: 4 imipenem, day 3 micafungin  Will stop valcyte and micfungin to try and improve her GI distress. palliative care notes seen.          Bobby Rumpf Infectious Diseases (pager) [phone] [email or web address] 12/23/2013, 3:40 PM  LOS: 46 days

## 2013-12-23 NOTE — Progress Notes (Signed)
Lacey [name]   DOB:1956/09/17   YI#:[id number]   Dr. Heath Lark covering for Dr. Marin Olp Subjective: The patient complained of nausea and uncontrolled diarrhea. Since last night, she estimated she had diarrhea 12 times. She denies any crampy abdominal pain. Denies any fevers or chills. The patient also has a sensation she could not breath. She denies any chest pain. The patient denies any recent signs or symptoms of bleeding such as spontaneous epistaxis, hematuria or hematochezia.  Objective:  Filed Vitals:   12/23/13 1122  BP: 131/81  Pulse: 92  Temp: 99.2 F (37.3 C)  Resp: 18     Intake/Output Summary (Last 24 hours) at 12/23/13 1338 Last data filed at 12/23/13 0932  Gross per 24 hour  Intake 1760.15 ml  Output      4 ml  Net 1756.15 ml    GENERAL:alert, no distress and comfortable SKIN: skin color, texture, turgor are normal, no rashes or significant lesions EYES: normal, Conjunctiva are pale and non-injected, sclera clear OROPHARYNX:no exudate, no erythema and lips, buccal mucosa, and tongue normal . No oral thrush. Noted dry mucous membranes. NECK: supple, thyroid normal size, non-tender, without nodularity LYMPH:  no palpable lymphadenopathy in the cervical, axillary or inguinal LUNGS: clear to auscultation and percussion with normal breathing effort HEART: regular rate & rhythm and no murmurs and no lower extremity edema ABDOMEN:abdomen soft, non-tender with active bowel sounds, mildly distended Musculoskeletal:no cyanosis of digits and no clubbing  NEURO: alert & oriented x 3 with fluent speech, no focal motor/sensory deficits   Labs:  Lab Results  Component Value Date   WBC 5.8 12/23/2013   HGB 9.7* 12/23/2013   HCT 29.9* 12/23/2013   MCV 86.2 12/23/2013   PLT 52* 12/23/2013   NEUTROABS 3.8 12/23/2013    Lab Results  Component Value Date   NA 144 12/23/2013   K 3.2* 12/23/2013   CL 109 12/23/2013   CO2 20 12/23/2013   Assessment & Plan:  #1 myelodysplastic  syndrome Per discussion by Dr. Marin Olp, the patient is not a candidate for further treatment. Continue supportive care. #2 anemia This is likely anemia of chronic disease. The patient denies recent history of bleeding such as epistaxis, hematuria or hematochezia. She is asymptomatic from the anemia. We will observe for now.  She does not require transfusion now. 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 She received platelet transfusion yesterday with good increment to platelet count. The patient has no symptoms of bleeding. Recommend transfusion to keep platelet count greater than 10,000. #4 recurrent infection She has no evidence of leukocytosis. She remained on broad-spectrum intravenous antibiotics. #5 nausea She is on schedule antiemetics #6 diarrhea Recent test for C. difficile is negative. #7 sensation she could not breathe I felt that that is an element of anxiety. I recommend oxygen therapy.  Overall, the patient has very poor prognosis. Family members are concerned that the patient may be depressed and that might be an element of denial. The hospitalist will consult palliative care to follow on the patient on a regular basis.  [facility], [place], [place] 12/23/2013  1:38 PM

## 2013-12-23 NOTE — Progress Notes (Addendum)
[facility] PROGRESS NOTE  Lacey [name] LSL:[id number] DOB: 18-Aug-1956 DOA: 11/24/2013 PCP: Vena Austria, MD  Brief Summary  58 y.o. 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  Her course has been complicated by recurrent septic and adrenal shock with multiple transfers to the ICU, intermittently requiring vasopressor support.  She developed a spontaneously retroperitoneal bleed in the left iliopsoas muscle which caused chronic weakness and pain of the left leg and persistent LLQ pain.  Her bone marrow has failed and she has required numerous blood and platelet transfusions.  She received some chemotherapy for her MDS, but she is not a candidate for bone marrow transplant due to her decreasing functional status.  It seems unlikely that her functional status will recover enough and she remain infection-free long enough to undergo bone marrow biopsy.   I spoke with the patient at length today regarding goals of care.  We talked about a spectrum of care, from very aggressive to full palliation, in the presence of one of her health care powers of attorney.  She is starting to understand the severity of her illness.  She changed her code status from full code to DNR a day ago and today she is frustrated because she has too much nausea to eat.  She is Lacey [name] of breath, her ears hurt, her sinuses are congested, she is vomiting and having diarrhea.  She is tired and depressed.  I expressed that I would try to address her symptoms as much as possible, but encouraged [place] to get palliative care involved for assistance with symptom management when the patient feels she is ready.  She is going to think about her [place] and we will talk about it on a daily basis.  Dr. Marin Olp to continue to address [place] also.     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  Did not require vasopressors this time. -  UCx grew 6000 colonies of VRE, likely colonization -  CXR stable -  Blood cx NGTD  -  Valganciclovir 3/26 >> -  primaxin day 3/24 >>  -  Micafungin 3/25 >> -  Appreciate ID assistance -  CMV still pending  Bilateral otitis media with sinus congestion -  Continue antibiotics to cover OM for at least 7 to 10 days -  Start auralgan -  Start afrin + nasal saline +   Increasing chest congestion and wheezing -  Repeat CXR -  Change duonebs to xopenex prn due to persistent tachycardia -  Consider restarting lasix  -  Change gauifenesin to tessalon -  On broad spectrum abx  Nausea, vomiting, and diarrhea -  Repeat KUB -  C. Diff negative -  Imodium prn -  On primaxin as above -  Continue florastor -  Change medications to IV where able -  D/c protonix as may be worsening diarrhea  Depression/anxiety, uncontrolled -  Continue cymbalta  -  Add low dose abilify  -  Change xanax to prn ativan  Chronic diastolic heart failure Preserved  EF and grade 1 diastolic heart failure.  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  Resolved.  Spontaneous retroperitoneal bleed in setting of thrombocytopenia; hemorrhage into her  left iliopsoas muscle which caused weakness and pain of her left leg and LLQ.   -  Change fentanyl to prn dilaudid as longer acting.  Right upper extremity superficial thrombosis, stable   Urge and stress incontinence; continue foley for comfort   Tongue ulcer; biopsied by ENT; path: SQUAMOUS EPITHELIUM. NO DYSPLASIA OR MALIGNANCY, improving  Drug-induced Rash: biopsied by surgery; Path:  Nonspecific findings, improving.  Removed sutures today   Severe protein calorie malnutrition,  -  Restart TPN tonight  Diet:  regular Access:  port IVF:  yes Proph:  SCDs  Code Status:  DNR Family Communication: patient  Disposition Plan:  Anticipate discharge to inpatient hospice.     Consultants:  Dr. Burney Gauze (oncology)  Dr Gibson Ramp (Surgery) curbside consult  Dr. Carlyle Basques (infectious disease)  Procedures:  11/12/2013 right upper extremity Doppler  No evidence of deep vein thrombosis involving the right upper extremity, left subclavian vein, and left internal jugular vein. There is superficial thrombosis noted in the right cephalic vein.  Old PICC removed 3/5; Left-sided PICC 03/06 >>>  Foley 3/2 >>>    -2/12 Bone marrow biopsy report pending  Echocardiogram 01/10/2013  - Left ventricle: mild focal basal hypertrophy of the septum.  -LVEF= ejection fraction was 55%. Wall motion was normal; there were no regional wall motion abnormalities.  -(grade 1 diastolic dysfunction).  - Aortic valve: There was no stenosis. Trivial regurgitation. - Mitral valve: Trivial regurgitation. - Left atrium: The atrium was mildly dilated. - Right ventricle: The cavity size was normal. Systolic function was normal. - Tricuspid valve: Peak RV-RA gradient: 45m Hg (S). - Pulmonary arteries: PA peak pressure: 219mHg (S). - Inferior vena cava: The vessel was normal in size; the respirophasic diameter changes were in the normal range (=50%); findings are consistent with normal central venous  pressure.  2/15 Upper extremity Dopplers demonstrated acute clot of the right cephalic vein  2/2/95hest CT demonstrated loculated right pleural effusion which does not appear to have intact  2/17 Echocardiogram demonstrates EF of 5562-13%ith diastolic dysfunction.  2/24 Abdominal CT demonstrated hemorrhage in the left iliacus muscle  2/23 Chest CT demonstrated persistent right pleural effusion and right middle lobe density  2/25 Head CT demonstrated sphenoid sinusitis  2/26 Bronchoscopy did not reveal an infectious organism  3/3 Lower extremity Dopplers did not detect DVTs  3/6 Chest CT> persistent collapse of the RML, RLL with a large pleural effusion. Development of a new left airspace opacity.  3/8 TTE >>> Ef 65%, no RWMA, PAP 35 torr  3/8 Abdomen CT >>> No findings consistent with sepsis, evolution of L iliac hematoma  3/8 [place] Effusion>>>small in volume, loculated & complex appearing by [place] THORA  Abx: linezolid 3/24 >> Imipenem 3/24 >> micafungin 3/25 >>  HPI/Subjective:  Patient states that she feels terrible.  Sinuses are congested, can't hear because ears are stopped up and hurting, still coughing and SOB, having nausea and vomiting and diarrhea.    Objective: Filed Vitals:   12/22/13  1715 12/22/13 2042 12/23/13 0512 12/23/13 1122  BP: 114/74 139/85 129/85 131/81  Pulse: 113 116 118 92  Temp: 98 F (36.7 C) 97.8 F (36.6 C) 98.1 F (36.7 C) 99.2 F (37.3 C)  TempSrc: Oral Oral Oral Oral  Resp: _0 Height:      Weight:      SpO2: 97% 100% 100% 96%    Intake/Output Summary (Last 24 hours) at 12/23/13 1250 Last data filed at 12/23/13 0932  Gross per 24 hour  Intake 1760.15 ml  Output      4 ml  Net 1756.15 ml   Filed Weights   12/20/13 1200 12/21/13 0350 12/22/13 0403  Weight: 77.4 kg (170 lb 10.2 oz) 77.3 kg (170 lb 6.7 oz) 77.7 kg (171 lb 4.8 oz)    Exam:   General:  CF, moderately ill-appearing  HEENT:  Bilateral ears with purulent OM,  bulging TMs, nares erythematous and congested.  NCAT, MMM  Cardiovascular:  RRR, nl S1, S2 no mrg, 2+ pulses, warm extremities  Respiratory:  rhonchorous BS, diminished at bilateral bases, + wheeze, no increased WOB  Abdomen:   NABS, soft, ND, mild TTP LLE  MSK:   Normal tone and bulk, trace LEE  Neuro:  Weakness LLE  Data Reviewed: Basic Metabolic Panel:  Recent Labs Lab 12/18/13 0520 12/19/13 0430 12/20/13 0405 12/20/13 1520 12/21/13 0410 12/22/13 0600 12/23/13 0453  NA 139 136* 136*  --  136* 137 144  K 2.9* 2.9* 3.3*  --  2.8* 3.4* 3.2*  CL 103 101 99  --  100 101 109  CO2 _1 --  _2 GLUCOSE 115* 131* 135*  --  213* 184* 80  BUN _3 --  14 26* 29*  CREATININE 0.92 1.02 1.03  --  1.20* 1.05 0.97  CALCIUM 8.7 8.6 8.6  --  9.0 8.8 8.9  MG 1.6 1.5  --   --  1.9 2.0  --   PHOS  --   --   --  2.1* 2.0* 3.8  --    Liver Function Tests:  Recent Labs Lab 12/19/13 0430 12/20/13 0405 12/21/13 0410 12/22/13 0600 12/23/13 0453  AST _4 39*  ALT _5 34  ALKPHOS 100 102 103 94 126*  BILITOT 1.3* 1.4* 1.6* 0.9 1.2  PROT 6.3 6.6 7.1 6.9 6.6  ALBUMIN 2.1* 2.1* 2.1* 2.0* 2.1*    Recent Labs Lab 12/21/13 0430  LIPASE 48  AMYLASE 35   No results found for this basename: AMMONIA,  in the last 168 hours CBC:  Recent Labs Lab 12/19/13 0430 12/20/13 0405 12/20/13 1115 12/21/13 0410 12/22/13 0600 12/23/13 0453  WBC 11.7* 10.9* 10.9* 12.3* 4.7 5.8  NEUTROABS 8.1* 8.2*  --  9.8* 3.6 3.8  HGB 7.8* 7.6* 8.0* 10.1* 15.0 9.7*  HCT 23.3* 22.9* 23.9* 30.5* 42.7 29.9*  MCV 85.3 86.1 84.8 84.7 82.9 86.2  PLT 14* 18* 20* 16* 10* 52*   Cardiac Enzymes:  Recent Labs Lab 12/20/13 1115  TROPONINI <0.30   BNP (last 3 results)  Recent Labs  11/14/13 0435 11/17/13 0800 11/20/13 0515  PROBNP 2977.0* 9541.0* 3178.0*   CBG:  Recent Labs Lab 12/21/13 1641 12/21/13 1925 12/21/13 2354 12/22/13 0347 12/22/13 0808  GLUCAP 150* 152*  148* 149* 138*    Recent Results (from the past 240 hour(s))  URINE CULTURE     Status: None  Collection Time    12/15/13 10:30 AM      Result Value Ref Range Status   Specimen Description URINE, CATHETERIZED   Final   Special Requests Immunocompromised   Final   Culture  Setup Time     Final   Value: 12/15/2013 13:04     Performed at [facility]     Final   Value: 6,000 COLONIES/ML     Performed at [facility]   Culture     Final   Value: VANCOMYCIN RESISTANT ENTEROCOCCUS ISOLATED     Note: CRITICAL RESULT CALLED TO, READ BACK BY AND VERIFIED WITH: [email or web address] [email or web address]  ON [id number] BY [facility]     Performed at [facility]   Report Status 12/19/2013 FINAL   Final   Organism ID, Bacteria VANCOMYCIN RESISTANT ENTEROCOCCUS ISOLATED   Final  CULTURE, BLOOD (ROUTINE X 2)     Status: None   Collection Time    12/19/13  8:10 AM      Result Value Ref Range Status   Specimen Description BLOOD RIGHT ARM   Final   Special Requests BOTTLES DRAWN AEROBIC ONLY 1 CC   Final   Culture  Setup Time     Final   Value: 12/19/2013 12:14     Performed at [facility]   Culture     Final   Value:        BLOOD CULTURE RECEIVED NO GROWTH TO DATE CULTURE WILL BE HELD FOR 5 DAYS BEFORE ISSUING A FINAL NEGATIVE REPORT     Performed at [facility]   Report Status PENDING   Incomplete  CULTURE, BLOOD (ROUTINE X 2)     Status: None   Collection Time    12/19/13  8:20 AM      Result Value Ref Range Status   Specimen Description BLOOD RIGHT ARM   Final   Special Requests BOTTLES DRAWN AEROBIC AND ANAEROBIC 2 CC EACH   Final   Culture  Setup Time     Final   Value: 12/19/2013 12:14     Performed at [facility]   Culture     Final   Value:        BLOOD CULTURE RECEIVED NO GROWTH TO DATE CULTURE WILL BE HELD FOR 5 DAYS BEFORE ISSUING A FINAL NEGATIVE REPORT     Performed at [facility]   Report Status PENDING   Incomplete  WOUND  CULTURE     Status: None   Collection Time    12/21/13 10:35 AM      Result Value Ref Range Status   Specimen Description ORAL ULCER LT SIDE ON TONGUE   Final   Special Requests Immunocompromised   Final   Gram Stain     Final   Value: NO WBC SEEN     NO SQUAMOUS EPITHELIAL CELLS SEEN     RARE GRAM POSITIVE RODS     FEW YEAST     Performed at [facility]   Culture     Final   Value: MULTIPLE ORGANISMS PRESENT, NONE PREDOMINANT     Note: NO STAPHYLOCOCCUS AUREUS ISOLATED NO GROUP A STREP (S.PYOGENES) ISOLATED     Performed at [facility]   Report Status 12/23/2013 FINAL   Final  CLOSTRIDIUM DIFFICILE BY PCR     Status: None   Collection Time    12/21/13  1:08 PM      Result Value Ref  Range Status   C difficile by pcr NEGATIVE  NEGATIVE Final   Comment: Performed at [facility]     Status: None   Collection Time    12/21/13  1:31 PM      Result Value Ref Range Status   Specimen Description OTHER TONGUE   Final   Special Requests Immunocompromised   Final   Culture     Final   Value: Culture has been initiated.     Note:    The current Enterovirus culture system does not detect Enterovirus D68. If Enterovirus D68 is suspected, please call client services to add the test for Enterovirus PCR (Test code [phone]).     Performed at [facility]   Report Status PENDING   Incomplete     Studies: No results found.  Scheduled Meds: . DULoxetine  60 mg Oral QPM  . feeding supplement (ENSURE COMPLETE)  237 mL Oral TID WC  . fluticasone  2 spray Each Nare Daily  . hydrocortisone  10 mg Oral BID  . imipenem-cilastatin  500 mg Intravenous Q8H  . metoCLOPramide (REGLAN) injection  5 mg Intravenous Q12H  . micafungin (MYCAMINE) IV  100 mg Intravenous Q24H  . multivitamin with minerals  1 tablet Oral Daily  . pantoprazole  40 mg Oral Daily  . potassium chloride  40 mEq Oral BID  . saccharomyces boulardii  250 mg Oral BID  . sodium  chloride  10-40 mL Intracatheter Q12H  . sodium chloride  10-40 mL Intracatheter Q12H  . sodium chloride  3 mL Intravenous Q12H  . triamcinolone cream   Topical TID  . valGANciclovir  450 mg Oral Daily   Continuous Infusions: . sodium chloride 20 mL/hr at 12/23/13 2277    Active Problems:   Myelodysplasia   Symptomatic anemia   Adrenal infarction   Acute renal failure   Acute on chronic diastolic heart failure   Altered mental status   Acute-on-chronic respiratory failure   Hypotension, unspecified   Severe sepsis   DVT (deep venous thrombosis)   Pleural effusion   Thrombocytopenia   Hypokalemia    Time spent: 30 min    Lacey [name], [place]  [facility] Pager [phone]. If 7PM-7AM, please contact night-coverage at [email or web address], password [facility] 12/23/2013, 12:50 PM  LOS: 46 days

## 2013-12-23 NOTE — Progress Notes (Signed)
MD stated that Pt not ready for d/c today and that goals of care are still being worked on but that it's likely that Pt will need residential Hospice at d/c.  Bernita Raisin, [place] Work [phone]

## 2013-12-24 DIAGNOSIS — R197 Diarrhea, unspecified: Secondary | ICD-10-CM

## 2013-12-24 LAB — COMPREHENSIVE METABOLIC PANEL
ALT: 20 U/L (ref 0–35)
AST: 15 U/L (ref 0–37)
Albumin: 2.1 g/dL — ABNORMAL LOW (ref 3.5–5.2)
Alkaline Phosphatase: 105 U/L (ref 39–117)
BUN: 25 mg/dL — ABNORMAL HIGH (ref 6–23)
CALCIUM: 8.9 mg/dL (ref 8.4–10.5)
CO2: 23 mEq/L (ref 19–32)
CREATININE: 1.24 mg/dL — AB (ref 0.50–1.10)
Chloride: 108 mEq/L (ref 96–112)
GFR calc Af Amer: 54 mL/min — ABNORMAL LOW (ref >90)
GFR calc non Af Amer: 47 mL/min — ABNORMAL LOW (ref >90)
GLUCOSE: 120 mg/dL — AB (ref 70–99)
Potassium: 3.3 mEq/L — ABNORMAL LOW (ref 3.7–5.3)
SODIUM: 144 meq/L (ref 137–147)
TOTAL PROTEIN: 6.6 g/dL (ref 6.0–8.3)
Total Bilirubin: 2.2 mg/dL — ABNORMAL HIGH (ref 0.3–1.2)

## 2013-12-24 LAB — GLUCOSE, CAPILLARY
GLUCOSE-CAPILLARY: 91 mg/dL (ref 70–99)
GLUCOSE-CAPILLARY: 128 mg/dL — AB (ref 70–99)
GLUCOSE-CAPILLARY: 122 mg/dL — AB (ref 70–99)
GLUCOSE-CAPILLARY: 116 mg/dL — AB (ref 70–99)
Glucose-Capillary: 117 mg/dL — ABNORMAL HIGH (ref 70–99)
Glucose-Capillary: 129 mg/dL — ABNORMAL HIGH (ref 70–99)

## 2013-12-24 LAB — CMV (CYTOMEGALOVIRUS) DNA ULTRAQUANT, PCR: CMV DNA Quant: <200 copies/mL

## 2013-12-24 LAB — CLOSTRIDIUM DIFFICILE BY PCR: Toxigenic C. Difficile by PCR: NEGATIVE

## 2013-12-24 LAB — PHOSPHORUS: PHOSPHORUS: 5.3 mg/dL — AB (ref 2.3–4.6)

## 2013-12-24 LAB — MAGNESIUM: MAGNESIUM: 2 mg/dL (ref 1.5–2.5)

## 2013-12-24 LAB — RETICULOCYTES: RBC.: 3.55 MIL/uL — ABNORMAL LOW (ref 3.87–5.11)

## 2013-12-24 MED ORDER — LORAZEPAM 2 MG/ML IJ SOLN: 0.5000 mg | INTRAMUSCULAR | Status: DC | PRN

## 2013-12-24 MED ORDER — HYDROCORTISONE NA SUCCINATE PF 100 MG IJ SOLR
30.0000 mg | Freq: Two times a day (BID) | INTRAMUSCULAR | Status: DC
  Filled 2013-12-24 (×3): qty 0.6

## 2013-12-24 MED ORDER — CLINIMIX/DEXTROSE (5/15) 5 % IV SOLN
INTRAVENOUS | Status: DC
  Administered 2013-12-24: 17:00:00 via INTRAVENOUS
  Filled 2013-12-24: qty 1000

## 2013-12-24 MED ORDER — POTASSIUM CHLORIDE 10 MEQ/100ML IV SOLN
10.0000 meq | INTRAVENOUS | Status: AC
  Administered 2013-12-24 (×6): 10 meq via INTRAVENOUS
  Filled 2013-12-24 (×6): qty 100

## 2013-12-24 MED ORDER — HYDROMORPHONE HCL PF 1 MG/ML IJ SOLN
0.5000 mg | INTRAMUSCULAR | Status: DC | PRN
  Administered 2013-12-24: 1 mg via INTRAVENOUS
  Filled 2013-12-24 (×2): qty 1

## 2013-12-24 MED ORDER — FAT EMULSION 20 % IV EMUL
250.0000 mL | INTRAVENOUS | Status: DC
  Administered 2013-12-24: 250 mL via INTRAVENOUS
  Filled 2013-12-24: qty 250

## 2013-12-24 MED ORDER — METRONIDAZOLE IN NACL 5-0.79 MG/ML-% IV SOLN
500.0000 mg | Freq: Three times a day (TID) | INTRAVENOUS | Status: DC
  Administered 2013-12-24 (×2): 500 mg via INTRAVENOUS
  Filled 2013-12-24 (×3): qty 100

## 2013-12-24 MED ORDER — DIPHENOXYLATE-ATROPINE 2.5-0.025 MG PO TABS
1.0000 | ORAL_TABLET | Freq: Four times a day (QID) | ORAL | Status: DC
  Administered 2013-12-24: 1 via ORAL
  Filled 2013-12-24 (×2): qty 1

## 2013-12-25 DIAGNOSIS — E2740 Unspecified adrenocortical insufficiency: Secondary | ICD-10-CM

## 2013-12-25 DIAGNOSIS — F32A Depression, unspecified: Secondary | ICD-10-CM

## 2013-12-25 DIAGNOSIS — E43 Unspecified severe protein-calorie malnutrition: Secondary | ICD-10-CM

## 2013-12-25 DIAGNOSIS — R112 Nausea with vomiting, unspecified: Secondary | ICD-10-CM

## 2013-12-25 DIAGNOSIS — R6521 Severe sepsis with septic shock: Secondary | ICD-10-CM

## 2013-12-25 DIAGNOSIS — E2749 Other adrenocortical insufficiency: Secondary | ICD-10-CM

## 2013-12-25 DIAGNOSIS — J189 Pneumonia, unspecified organism: Secondary | ICD-10-CM

## 2013-12-25 DIAGNOSIS — A419 Sepsis, unspecified organism: Secondary | ICD-10-CM

## 2013-12-25 DIAGNOSIS — K661 Hemoperitoneum: Secondary | ICD-10-CM

## 2013-12-25 DIAGNOSIS — F329 Major depressive disorder, single episode, unspecified: Secondary | ICD-10-CM

## 2013-12-25 LAB — CULTURE, BLOOD (ROUTINE X 2)
Culture: NO GROWTH
Culture: NO GROWTH

## 2013-12-25 LAB — CBC WITH DIFFERENTIAL/PLATELET
Basophils Absolute: 0.1 10*3/uL (ref 0.0–0.1)
Basophils Relative: 1 % (ref 0–1)
EOS ABS: 0.2 10*3/uL (ref 0.0–0.7)
Eosinophils Relative: 2 % (ref 0–5)
HEMATOCRIT: 31.2 % — AB (ref 36.0–46.0)
HEMOGLOBIN: 9.9 g/dL — AB (ref 12.0–15.0)
Lymphocytes Relative: 24 % (ref 12–46)
Lymphs Abs: 1.9 10*3/uL (ref 0.7–4.0)
MCH: 27.9 pg (ref 26.0–34.0)
MCHC: 31.7 g/dL (ref 30.0–36.0)
MCV: 87.9 fL (ref 78.0–100.0)
MONOS PCT: 3 % (ref 3–12)
Monocytes Absolute: 0.2 10*3/uL (ref 0.1–1.0)
NEUTROS ABS: 5.4 10*3/uL (ref 1.7–7.7)
Neutrophils Relative %: 70 % (ref 43–77)
Platelets: 38 10*3/uL — ABNORMAL LOW (ref 150–400)
RBC: 3.55 MIL/uL — ABNORMAL LOW (ref 3.87–5.11)
RDW: 16.5 % — ABNORMAL HIGH (ref 11.5–15.5)
WBC: 7.8 10*3/uL (ref 4.0–10.5)

## 2013-12-25 NOTE — Discharge Summary (Addendum)
Death Summary  Lacey [name] CXW:[id number] DOB: 01/22/1956 DOA: 12-05-2013  PCP: Lolita Patella, MD  Admit date: 2013/12/05 Date of Death: 21-Jan-2014  Final Diagnoses:  Principal Problem:   Myelodysplasia Active Problems:   Symptomatic anemia   Adrenal infarction   Acute renal failure   Acute on chronic diastolic heart failure   Altered mental status   Acute-on-chronic respiratory failure   Hypotension, unspecified   Severe sepsis   DVT (deep venous thrombosis)   Pleural effusion   Thrombocytopenia   Hypokalemia   Bilateral otitis media   Diarrhea   Severe sepsis with septic shock   HCAP (healthcare-associated pneumonia)   Nausea and vomiting   Depression   Protein-calorie malnutrition, severe   Retroperitoneal hematoma   Adrenal hemorrhage   Adrenal insufficiency   History of present illness:  Lacey [name] is a 58 y.o. female with Past medical history of Hodgkin's lymphoma status post chemotherapy in the pas has myelodysplastic syndrome.  The patient is coming from home.   Patient presents with complaints of nausea vomiting and right-sided abdominal pain.  The patient mentions that her symptoms started a month ago with cough and yellowish expectoration for which she completed a course of amoxicillin despite which her cough was not getting better.since last one month she started having dyspnea on exertion which is progressively worsening.denies any orthopnea or PND.  Since last one week she started having complaints of chest tightness on exertion along with dyspnea which resolved on rest. She is at present chest pain-free.   She was on a cruise to [place] last week and off for going on cruise she started having episodes of black color bowel movements which are present on last Tuesday without any abdominal pain or nausea and resolved. She continues to have diarrhea then on and off but no melena or active bleeding. She got some medication and cruise.   Since Saturday started having complaints of pain in her right lower abdomen which was sharp and was associated with nausea and was to 6 episodes of vomiting today. She has poor appetite and has not had any today.  She's taking her medications at home and there is no change in her medication and last 1 month.  She denies any burning urination but she did have a fever of 102.65F yesterday.   She did have some bleeding from her nose on and off since last one month associated with redness and swelling of her nasal bridge with some blurring of vision which is present since last one week. She denies any focal neurological deficit she has chronic orthostatic dizziness which has not changed.  Hospital Course:   Brief Summary  58 y.o. 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 Her course was complicated by recurrent septic and adrenal shock with multiple transfers to the ICU, intermittently requiring vasopressor support. She developed a spontaneously retroperitoneal bleed in the left iliopsoas muscle which caused chronic weakness and pain of the left leg and persistent LLQ pain. Her bone marrow failed and she required numerous blood and platelet transfusions. She received some chemotherapy for her MDS, but she was not a candidate for bone marrow transplant due to her decreasing functional status.  She done on 329 at 2355, likely secondary to recurrent infection and progression of her myelodysplasia.  By Problem   Myelodysplastic syndrome in setting  of Hodgkin Lymphoma, received many blood and platelet transfusions.  Bone marrow biopsy demonstrated hypercellular bone marrow for age with dysplastic changes. She received vidaza, however, this was discontinued secondary to persistent nausea, vomiting, recurrent sepsis.     Recurrent fever/septic shock, pneumonia.  She had recurrent septic shock during this admission. Her initial source was likely pneumonia. She received a course of antibiotics for healthcare associated pneumonia and sepsis, however she continued to have persistent cough, shortness of breath, and after antibiotics were discontinued, she developed recurrent sepsis. CT chest on February 23 demonstrated a persisting consolidation in the right middle lobe with new opacities suggestive of bronchopneumonia. She underwent bronchoscopy by pulmonology on the 26th, however all of her cultures were negative for bruit normal flora. Her blood cultures remained no growth, C. difficile PCR was negative. She received a second course of antibiotics for suspected superimposed pneumonia, improved. She developed septic shock and again on March 6 with fever. CT scan at that time demonstrated persistent right upper lobe collapse with consolidation, and interval development of central airspace disease in the left upper lobe. She restarted antibiotics, antifungals, and acyclovir. At that time she required vasopressor support. After she completed that course of antibiotics under the guidance of infectious disease, her PICC line was removed, and all of her antibiotics, antifungals, and antiviral medications were discontinued. Again her cultures were negative. She remained well for a few days, but on the 24th, she developed recurrent fever and hypotension. Antibiotics, antifungals and antiviral medications were restarted by infectious disease. No source was identified. She did have approximately 6000 colonies of VRE on her urine culture, however this was likely contaminant. She developed some diarrhea around this time, and C. difficile was negative. Her diarrhea worsened, and despite her C. difficile PCR being negative, she was started on empiric Flagyl. CMV PCR was less than 200.  Nausea, vomiting, and diarrhea, C. Diff negative 3/26, but  increasing stool frequency and  began to smell like C. Diff.  She was unable to tolerate by mouth was started on IV Flagyl. She was given Imodium and Lomotil when necessary. Her medications were changed to intravenous, and she was restarted on TPN.   Bilateral otitis media with sinus congestion, she remained on imipenem. She was given around him. For sinus congestion, Afrin, nasal saline were added to her Flonase.  Chest congestion and wheezing remained a persistent problem. Repeat chest x-ray demonstrated fluid in the fissure and a left pleural effusion. She received DuoNeb, guaifenesin, Tessalon, a thin broad-spectrum antibiotics.  Depression/anxiety, uncontrolled.  She remains on Cymbalta. Her friends are very concerned that she was becoming increasingly depressed, particularly as her husband died unexpectedly during her hospitalization and because she had been in the hospital very ill for a month and a half.  She was given a trial of Abilify, however because of increasing sedation, this was discontinued.   Chronic diastolic heart failure Preserved EF and grade 1 diastolic heart failure. Dry weight of 77kg   Right pleural effusion, pulmonology deferred thoracentesis due to thrombocytopenia, R sided pleural effusion.  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  Acute Renal Failure due to septic shock; RUS wnl. Resolved.   Spontaneous retroperitoneal bleed in setting of thrombocytopenia; hemorrhage into her left iliopsoas muscle which caused weakness and pain of her left leg and LLQ.   Right upper extremity superficial thrombosis, stable.  She did not receive further anticoagulation for her thrombus, secondary to spontaneous retroperitoneal bleed and persistent thrombocytopenia.  Urge and stress incontinence; replace foley for comfort.  Tongue  ulcer; biopsied by ENT; path: SQUAMOUS EPITHELIUM. NO DYSPLASIA OR MALIGNANCY, improving   Drug-induced Rash: biopsied by surgery; Path: Nonspecific findings, improving. Removed sutures.   Severe protein calorie malnutrition, Restarted TPN due to persistent nausea and vomiting.     Time spent:  35 minutes  Signed:  Yehudis Monceaux  [facility] 12/25/2013, 7:20 AM

## 2013-12-25 NOTE — Progress Notes (Signed)
Earlier in shift, RN paged this NP secondary to pt expiring 2354/01/16 2014-01-11. Pronounced by 2 RNs. NP to bedside. Multiple family present who spoke of pt's great attitude, strength, and long fight with her illness. Personal pastor at bedside. Family coping well at this time. Condolences and support offered. Death certificate completed and given to RN.  Clance Boll, NP [facility]

## 2013-12-27 NOTE — Progress Notes (Signed)
[facility] PROGRESS NOTE  Lacey [name] JKD:[id number] DOB: [birth year] DOA: 11/04/2013 PCP: Vena Austria, MD  Brief Summary  58 y.o. 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  Her course has been complicated by recurrent septic and adrenal shock with multiple transfers to the ICU, intermittently requiring vasopressor support.  She developed a spontaneously retroperitoneal bleed in the left iliopsoas muscle which caused chronic weakness and pain of the left leg and persistent LLQ pain.  Her bone marrow has failed and she has required numerous blood and platelet transfusions.  She received some chemotherapy for her MDS, but she is not a candidate for bone marrow transplant due to her decreasing functional status.  It seems unlikely that her functional status will recover enough and she remain infection-free long enough to undergo bone marrow biopsy.   Patient too ill to discuss goals of care today.  Despite our long conversation yesterday, she states she has not thought about her goals of care, but she is amenable to palliative care consultation.  Her friends feel she is doing poorly.  I will continue abx and TPN for now.    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  Did not require vasopressors this time.   -  UCx grew 6000 colonies of VRE, likely colonization -  CXR stable -  Blood cx NGTD  -  Valganciclovir  d/c'd -  primaxin day 3/24 >>  (day 6 of 7) -  Micafungin d/c'd -  Appreciate ID assistance -  CMV < 200   Nausea, vomiting, and diarrhea, C. Diff negative 3/26, but increasing stool frequency and smells like C. diff -  Repeat C. Diff -  Start IV flagyl (not tolerating PO) -  Imodium prn -  Add lamotil -  On primaxin as above -  Continue florastor -  Continue IV medications  Bilateral otitis media with sinus congestion -  Continue antibiotics to cover OM for at least 7 to 10 days -  Start auralgan -  Start afrin + nasal saline + continued flonase  Chest congestion and wheezing -  Repeat CXR:  Fluid in fissure and left pleural effusion -  Given lasix $RemoveBef'20mg'DywFtYBreZ$  IV yesterday -  Change duonebs to xopenex prn due to persistent tachycardia -  Change gauifenesin to tessalon -  On broad spectrum abx  Depression/anxiety, uncontrolled, somnolent since yesterday, May have been due to abilify even though it was low dose -  Continue cymbalta  -  D/c abilify -  Change xanax to prn ativan  Chronic diastolic heart failure Preserved EF and grade 1 diastolic heart failure.  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  Resolved.  Spontaneous retroperitoneal bleed in setting of thrombocytopenia; hemorrhage  into her left iliopsoas muscle which caused weakness and pain of her left leg and LLQ.   -  Change fentanyl to prn dilaudid as longer acting.  Right upper extremity superficial thrombosis, stable   Urge and stress incontinence; replace foley for comfort   Tongue ulcer; biopsied by ENT; path: SQUAMOUS EPITHELIUM. NO DYSPLASIA OR MALIGNANCY, improving  Drug-induced Rash:  biopsied by surgery; Path:  Nonspecific findings, improving.  Removed sutures today   Severe protein calorie malnutrition,  -  Continue TPN tonight  Diet:  regular Access:  port IVF:  yes Proph:  SCDs  Code Status:  DNR Family Communication: patient  Disposition Plan:  Anticipate discharge to inpatient hospice.     Consultants:  Dr. Burney Gauze (oncology)  Dr Gibson Ramp (Surgery) curbside consult  Dr. Carlyle Basques (infectious disease)  Procedures:  11/12/2013 right upper extremity Doppler  No evidence of deep vein thrombosis involving the right upper extremity, left subclavian vein, and left internal jugular vein. There is superficial thrombosis noted in the right cephalic vein.  Old PICC removed 3/5; Left-sided PICC 03/06 >>>  Foley 3/2 >>>    -2/12 Bone marrow biopsy report pending  Echocardiogram 01/10/2013  - Left ventricle: mild focal basal hypertrophy of the septum.  -LVEF= ejection fraction was 55%. Wall motion was normal; there were no regional wall motion abnormalities.  -(grade 1 diastolic dysfunction).  - Aortic valve: There was no stenosis. Trivial regurgitation. - Mitral valve: Trivial regurgitation. - Left atrium: The atrium was mildly dilated. - Right ventricle: The cavity size was normal. Systolic function was normal. - Tricuspid valve: Peak RV-RA gradient: 60mm Hg (S). - Pulmonary arteries: PA peak pressure: 34mm Hg (S). - Inferior vena cava: The vessel was normal in size; the respirophasic diameter changes were in the normal range (=50%); findings are consistent with normal central venous pressure.  2/15 Upper extremity Dopplers demonstrated acute clot of the right cephalic vein  7/62 Chest CT demonstrated loculated right pleural effusion which does not appear to have intact  2/17 Echocardiogram demonstrates EF of 83-15% with diastolic dysfunction.  2/24 Abdominal CT demonstrated hemorrhage in the left iliacus muscle  2/23 Chest CT demonstrated  persistent right pleural effusion and right middle lobe density  2/25 Head CT demonstrated sphenoid sinusitis  2/26 Bronchoscopy did not reveal an infectious organism  3/3 Lower extremity Dopplers did not detect DVTs  3/6 Chest CT> persistent collapse of the RML, RLL with a large pleural effusion. Development of a new left airspace opacity.  3/8 TTE >>> Ef 65%, no RWMA, PAP 35 torr  3/8 Abdomen CT >>> No findings consistent with sepsis, evolution of L iliac hematoma  3/8 [place] R Effusion>>>small in volume, loculated & complex appearing by [place], NO THORA  Abx: linezolid 3/24 >>2/27 Imipenem 3/24 >> micafungin 3/25 >>3/28  HPI/Subjective:  Patient states that she feels terrible.  Sinuses are better, but ears still hurt and are stopped up.  Still SOB and coughing.  Exhausted and having frequent diarrhea  Objective: Filed Vitals:   12/23/13 2018 12/23/13 2117 Jan 05, 2014 0430 01/05/14 0800  BP: 148/93  101/60   Pulse: 120  131   Temp: 98.6 F (37 C)  99.2 F (37.3 C)   TempSrc: Oral  Oral   Resp: 22  23   Height:      Weight:   77.3 kg (170 lb 6.7 oz)   SpO2: 97% 98% 97% 90%    Intake/Output Summary (Last 24 hours) at 01/05/14 1347 Last data  filed at 01/14/14 1207  Gross per 24 hour  Intake    930 ml  Output    300 ml  Net    630 ml   Filed Weights   12/21/13 0350 12/22/13 0403 01/14/14 0430  Weight: 77.3 kg (170 lb 6.7 oz) 77.7 kg (171 lb 4.8 oz) 77.3 kg (170 lb 6.7 oz)    Exam:   General:  CF, pale and ill-appearing, asleep but arousable  HEENT:  Tms not observed today.  NCAT, MMM  Cardiovascular:  Tachycardic RR, nl S1, S2 no mrg, 2+ pulses, warm extremities  Respiratory:  rhonchorous BS, diminished at bilateral bases, + wheeze, no increased WOB  Abdomen:   Hyperactive BS, soft, ND  MSK:   Normal tone and bulk, trace LEE  Neuro:  Weakness LLE  Data Reviewed: Basic Metabolic Panel:  Recent Labs Lab 12/18/13 0520 12/19/13 0430 12/20/13 0405 12/20/13 1520  12/21/13 0410 12/22/13 0600 12/23/13 0453 2014-01-14 0513  NA 139 136* 136*  --  136* 137 144 144  K 2.9* 2.9* 3.3*  --  2.8* 3.4* 3.2* 3.3*  CL 103 101 99  --  100 101 109 108  CO2 $Re'24 23 22  'axD$ --  $R'22 20 20 23  'ce$ GLUCOSE 115* 131* 135*  --  213* 184* 80 120*  BUN $Re'6 6 7  'ZBv$ --  14 26* 29* 25*  CREATININE 0.92 1.02 1.03  --  1.20* 1.05 0.97 1.24*  CALCIUM 8.7 8.6 8.6  --  9.0 8.8 8.9 8.9  MG 1.6 1.5  --   --  1.9 2.0  --  2.0  PHOS  --   --   --  2.1* 2.0* 3.8  --  5.3*   Liver Function Tests:  Recent Labs Lab 12/20/13 0405 12/21/13 0410 12/22/13 0600 12/23/13 0453 2014/01/14 0513  AST $Re'13 10 15 'BFg$ 39* 15  ALT $Re'12 10 12 'YMO$ 34 20  ALKPHOS 102 103 94 126* 105  BILITOT 1.4* 1.6* 0.9 1.2 2.2*  PROT 6.6 7.1 6.9 6.6 6.6  ALBUMIN 2.1* 2.1* 2.0* 2.1* 2.1*    Recent Labs Lab 12/21/13 0430  LIPASE 48  AMYLASE 35   No results found for this basename: AMMONIA,  in the last 168 hours CBC:  Recent Labs Lab 12/20/13 0405 12/20/13 1115 12/21/13 0410 12/22/13 0600 12/23/13 0453 Jan 14, 2014 0513  WBC 10.9* 10.9* 12.3* 4.7 5.8 7.8  NEUTROABS 8.2*  --  9.8* 3.6 3.8 5.4  HGB 7.6* 8.0* 10.1* 15.0 9.7* 9.9*  HCT 22.9* 23.9* 30.5* 42.7 29.9* 31.2*  MCV 86.1 84.8 84.7 82.9 86.2 87.9  PLT 18* 20* 16* 10* 52* 38*   Cardiac Enzymes:  Recent Labs Lab 12/20/13 1115  TROPONINI <0.30   BNP (last 3 results)  Recent Labs  11/14/13 0435 11/17/13 0800 11/20/13 0515  PROBNP 2977.0* 9541.0* 3178.0*   CBG:  Recent Labs Lab 12/23/13 1957 01-14-2014 0005 Jan 14, 2014 0359 January 14, 2014 0731 Jan 14, 2014 1159  GLUCAP 96 117* 129* 91 128*    Recent Results (from the past 240 hour(s))  URINE CULTURE     Status: None   Collection Time    12/15/13 10:30 AM      Result Value Ref Range Status   Specimen Description URINE, CATHETERIZED   Final   Special Requests Immunocompromised   Final   Culture  Setup Time     Final   Value: 12/15/2013 13:04     Performed at [place]     Final  Value: 6,000 COLONIES/ML     Performed at [facility]     Final   Value: VANCOMYCIN RESISTANT ENTEROCOCCUS ISOLATED     Note: CRITICAL RESULT CALLED TO, READ BACK BY AND VERIFIED WITH: KRISTEN [email or web address]  ON [id number] BY [facility]     Performed at [facility]   Report Status 12/19/2013 FINAL   Final   Organism ID, Bacteria VANCOMYCIN RESISTANT ENTEROCOCCUS ISOLATED   Final  CULTURE, BLOOD (ROUTINE X 2)     Status: None   Collection Time    12/19/13  8:10 AM      Result Value Ref Range Status   Specimen Description BLOOD RIGHT ARM   Final   Special Requests BOTTLES DRAWN AEROBIC ONLY 1 CC   Final   Culture  Setup Time     Final   Value: 12/19/2013 12:14     Performed at [facility]   Culture     Final   Value:        BLOOD CULTURE RECEIVED NO GROWTH TO DATE CULTURE WILL BE HELD FOR 5 DAYS BEFORE ISSUING A FINAL NEGATIVE REPORT     Performed at [facility]   Report Status PENDING   Incomplete  CULTURE, BLOOD (ROUTINE X 2)     Status: None   Collection Time    12/19/13  8:20 AM      Result Value Ref Range Status   Specimen Description BLOOD RIGHT ARM   Final   Special Requests BOTTLES DRAWN AEROBIC AND ANAEROBIC 2 CC EACH   Final   Culture  Setup Time     Final   Value: 12/19/2013 12:14     Performed at [facility]   Culture     Final   Value:        BLOOD CULTURE RECEIVED NO GROWTH TO DATE CULTURE WILL BE HELD FOR 5 DAYS BEFORE ISSUING A FINAL NEGATIVE REPORT     Performed at [facility]   Report Status PENDING   Incomplete  WOUND CULTURE     Status: None   Collection Time    12/21/13 10:35 AM      Result Value Ref Range Status   Specimen Description ORAL ULCER LT SIDE ON TONGUE   Final   Special Requests Immunocompromised   Final   Gram Stain     Final   Value: NO WBC SEEN     NO SQUAMOUS EPITHELIAL CELLS SEEN     RARE GRAM POSITIVE RODS     FEW YEAST     Performed at [facility]   Culture     Final    Value: MULTIPLE ORGANISMS PRESENT, NONE PREDOMINANT     Note: NO STAPHYLOCOCCUS AUREUS ISOLATED NO GROUP A STREP (S.PYOGENES) ISOLATED     Performed at [facility]   Report Status 12/23/2013 FINAL   Final  CLOSTRIDIUM DIFFICILE BY PCR     Status: None   Collection Time    12/21/13  1:08 PM      Result Value Ref Range Status   C difficile by pcr NEGATIVE  NEGATIVE Final   Comment: Performed at [place]     Status: None   Collection Time    12/21/13  1:31 PM      Result Value Ref Range Status   Specimen Description OTHER TONGUE   Final   Special Requests Immunocompromised   Final   Culture  Final   Value: Culture has been initiated.     Note:    The current Enterovirus culture system does not detect Enterovirus D68. If Enterovirus D68 is suspected, please call client services to add the test for Enterovirus PCR (Test code [phone]).     Performed at [facility]   Report Status PENDING   Incomplete     Studies: Dg Chest Port 1 View  12/23/2013   CLINICAL DATA:  Shortness of breath.  Congestion.  Cough.  EXAM: PORTABLE CHEST - 1 VIEW  COMPARISON:  DG CHEST 1V PORT dated 12/20/2013; CT CHEST W/O CM dated 12/01/2013  FINDINGS: Chronic bandlike opacity along the minor fissure may reflect fluid in the fissure or and/or atelectasis.  Blunting of the left costophrenic angle. Bandlike opacities in the right lower lobe. Heart size within normal limits.  Left-sided PICC line tip:  Cavoatrial junction.  IMPRESSION: 1. Stable appearance of the chest with fluid in the right minor fissure and left pleural effusion blunting the left costophrenic angle. Continued mild subsegmental atelectasis at the right lung base.   Electronically Signed   By: Sherryl Barters M.D.   On: 12/23/2013 15:38   Dg Abd Portable 1v  12/23/2013   CLINICAL DATA:  Nausea, vomiting, diarrhea.  EXAM: PORTABLE ABDOMEN - 1 VIEW  COMPARISON:  None.  FINDINGS: Mild gaseous distension of large  and small bowel. The bowel gas pattern is nonobstructive. Bowel gas is present in the region of the rectosigmoid. No gross plain film evidence of free air on this supine radiograph.  IMPRESSION: Normal nonobstructive bowel gas pattern.   Electronically Signed   By: Dereck Ligas M.D.   On: 12/23/2013 15:39    Scheduled Meds: . ARIPiprazole  2 mg Oral Daily  . benzonatate  100 mg Oral TID  . DULoxetine  60 mg Oral QPM  . feeding supplement (ENSURE COMPLETE)  237 mL Oral TID WC  . fluticasone  2 spray Each Nare Daily  . hydrocortisone sod succinate (SOLU-CORTEF) inj  10 mg Intravenous BID WC  . imipenem-cilastatin  500 mg Intravenous Q8H  . insulin aspart  0-9 Units Subcutaneous 6 times per day  . levalbuterol  1.25 mg Nebulization TID  . multivitamin with minerals  1 tablet Oral Daily  . oxymetazoline  1 spray Each Nare BID  . saccharomyces boulardii  250 mg Oral BID  . sodium chloride  10-40 mL Intracatheter Q12H  . sodium chloride  10-40 mL Intracatheter Q12H  . sodium chloride  3 mL Intravenous Q12H   Continuous Infusions: . sodium chloride Stopped (12/23/13 1547)  . Marland KitchenTPN (CLINIMIX-E) Adult 30 mL/hr at 12/23/13 1721   And  . fat emulsion 250 mL (12/23/13 1722)  . TPN (CLINIMIX) Adult without lytes     And  . fat emulsion      Active Problems:   Myelodysplasia   Symptomatic anemia   Adrenal infarction   Acute renal failure   Acute on chronic diastolic heart failure   Altered mental status   Acute-on-chronic respiratory failure   Hypotension, unspecified   Severe sepsis   DVT (deep venous thrombosis)   Pleural effusion   Thrombocytopenia   Hypokalemia   Bilateral otitis media    Time spent: 30 min    Rogelio Winbush, [facility] Pager [phone]. If 7PM-7AM, please contact night-coverage at [email or web address], password [facility] 25-Dec-2013, 1:47 PM  LOS: 47 days

## 2013-12-27 NOTE — Progress Notes (Signed)
PARENTERAL NUTRITION CONSULT NOTE - FOLLOW UP  Pharmacy Consult for TNA (resume 3/28) Indication: Vomiting, no PO intake < 7 days  Allergies  Allergen Reactions  . Penicillins Hives and Rash  . Contrast Media [Iodinated Diagnostic Agents]    Patient Measurements: Height: 5' (152.4 cm) Weight: 170 lb 6.7 oz (77.3 kg) IBW/kg (Calculated) : 45.5 Usual Weight: 76.7kg  Vital Signs: Temp: 99.2 F (37.3 C) (03/29 0430) Temp src: Oral (03/29 0430) BP: 101/60 mmHg (03/29 0430) Pulse Rate: 131 (03/29 0430) Intake/Output from previous day: 03/28 0701 - 03/29 0700 In: 1390 [P.O.:720; I.V.:170; IV Piggyback:500] Out: 600 [Urine:600] Intake/Output from this shift:    Labs:  Recent Labs  12/22/13 0600 12/23/13 0453 Jan 15, 2014 0513  WBC 4.7 5.8 7.8  HGB 15.0 9.7* 9.9*  HCT 42.7 29.9* 31.2*  PLT 10* 52* 38*    Recent Labs  12/22/13 0600 12/23/13 0453 01/15/14 0513  NA 137 144 144  K 3.4* 3.2* 3.3*  CL 101 109 108  CO2 20 20 23   GLUCOSE 184* 80 120*  BUN 26* 29* 25*  CREATININE 1.05 0.97 1.24*  CALCIUM 8.8 8.9 8.9  MG 2.0  --  2.0  PHOS 3.8  --  5.3*  PROT 6.9 6.6 6.6  ALBUMIN 2.0* 2.1* 2.1*  AST 15 39* 15  ALT 12 34 20  ALKPHOS 94 126* 105  BILITOT 0.9 1.2 2.2*   Estimated Creatinine Clearance: 44.9 ml/min (by C-G formula based on Cr of 1.24).   Recent Labs  01-15-2014 0005 01-15-2014 0359 2014-01-15 0731  GLUCAP 117* 129* 91   Insulin Requirements in the past 24 hours:  Novolog sensitive scale q4hr CBG 96/91, no insulin needed  Current Nutrition:  Ensure complete TID with meals - charted once 3/24 & 3/26 Patient attempted to increase oral intake 3/27 (TNA d/c) TNA resumed 3/28: Clinimix E 5/15 at 30 ml/hr  Nutritional Goals:  RD recs: Kcal: 1300-1500, Protein: 55-70g, Fluid: 1.3-1.5L/day  Clinimix E5/15 at a goal rate of 54ml/hr + IVFE 20% at 65ml/hr to provide: 78g/day protein, 1588Kcal/day avg   IVF: NS at [facility]  Lines/Drains/Airways:  PICC triple  lumen left basilic (placed 06/03/77)   Assessment:  Glucose CBG's < 150mg /dl, on hydrocortisone 10mg  IV q12h Electrolytes - Na 144, K 3.3-for 6 runs KCl, Mag 2, Phos elevated at 5.3  Renal: SCr increased at 1.24 LFTs - Tbili 2.2 TGs - 131 (3/26)  Prealbumin -4.5 (3/26) -represents severely depleted protein stores  16 yoF with hx Hodgkin's lymphoma and MDS admitted since 2/10 with sepsis complicated with adrenal infarct, adrenal insuffiency, and HCAP. Pt started treatment Heparin to lovenox for superficial thrombus in R cephalic vein and hypercoaguable state, however developed hemorrahge into her left iliopsoas muscle and has remained off anticoagulation since 2/24. Pt completed 1 cycle of vidaza (2 doses held) 3/11 - 3/17 for MDS however has refractory anemia and thrombocytopenia requiring almost daily blood product transfusions. Antibiotics restarted 3/24 for VRE urine infection. Pt also with progressive rash s/p skin biopsy, nonspecific results. On 3/25: Pharmacy consulted to start TPN for persistent vomiting and no oral intake since 3/18. Spoke with Dr. Jonette Eva about possible trial of tube feeds, however he wishes to proceed with TPN tonight. RD consult completed yesterday.   Plan:   Change TNA to Clinimix without lytes 5/15 at 30 ml/hr (elevated Phosphorus)  No TNA rate increase until lytes normalized  20% lipids 78ml/hr  On PO multivitamin - refused dose this am so will add MVI to  TNA tonight  Evaluate need for TE MWF if continues to refuse PO MVI  TNA labs Mon,Thur  Minda Ditto PharmD Pager [phone] 12/20/2013, 10:42 AM

## 2013-12-27 NOTE — Progress Notes (Signed)
NUTRITION FOLLOW UP  Intervention:    Clinimix E5/15 at a goal rate of 18ml/hr + IVFE 20% at 21ml/hr to provide: 78g/day protein, 1588Kcal/day avg   Diet as tolerated.  RD to follow   Per chart, patient will be followed by palliative care and may need to d/c to an inpatient hospice facility.  TPN use to continue to be addressed.    Nutrition Dx:   Inadequate oral intake related to nausea/vomiting as evidenced by pt report - improving   Goal:   1. Resolution of nausea/vomiting - met 2. Pt to consume >90% of meals/supplements - not met   Monitor:   Weights, labs, intake  Assessment:   Met with pt earlier in admission on 2/23, during which time she was eating excellent and had reported eating well PTA and denied any changes in weight. Inpatient RD met with pt 3/16 to discuss diet therapy for sore mouth and changes in taste/smell. During RD visit, pt had reported her appetite was improving and was drinking Ensure Complete occasionally.   Pt's husband died suddenly 11-Jan-2023. Noted pt started having vomiting and several episodes of nausea 3/19. Last chemo was 3/16.   3/24 - Met with pt who reports having nausea/vomiting for the past 2 weeks. Last documented PO intake was 11-Jan-2023 with pt consuming 50% of meals. Pt agreeable to getting scheduled Ensure Complete. Noted pt's weight down 26 pounds since November 2014.   3/27 - Per conversation with oncologist, pt does not want a feeding tube. Plan is to d/c TPN today. Met with pt who reports eating 50% of meals on regular diet (advanced today) and reports she has consumed 2 Ensure Complete so far today. Denies any nausea. Only c/o is 4 episodes of diarrhea today. Getting Imodium and Florastor.   Potassium low, getting multivitamin   3/29 - TPN resumed.  Patient eating very little.  Drinking only small amounts of juice and water today.  Potassium remains low and this is supplemented.    Height: Ht Readings from Last 1 Encounters:  12/20/13 5'  (1.524 m)    Weight Status:   Wt Readings from Last 1 Encounters:  01-21-14 170 lb 6.7 oz (77.3 kg)  11/11/2013         184 lb (83.4 kg)  Re-estimated needs:  Kcal: 1300-1500  Protein: 55-70g  Fluid: 1.3-1.5L/day   Skin: +1 generalized edema, +1 RUE, LUE, LLE, RLE edema   Diet Order: General   Intake/Output Summary (Last 24 hours) at 2014-01-21 1304 Last data filed at 01-21-14 1207  Gross per 24 hour  Intake   1170 ml  Output    600 ml  Net    570 ml    Last BM: 3/26   Labs:   Recent Labs Lab 12/21/13 0410 12/22/13 0600 12/23/13 0453 01/21/2014 0513  NA 136* 137 144 144  K 2.8* 3.4* 3.2* 3.3*  CL 100 101 109 108  CO2 $Re'22 20 20 23  'HGF$ BUN 14 26* 29* 25*  CREATININE 1.20* 1.05 0.97 1.24*  CALCIUM 9.0 8.8 8.9 8.9  MG 1.9 2.0  --  2.0  PHOS 2.0* 3.8  --  5.3*  GLUCOSE 213* 184* 80 120*    CBG (last 3)   Recent Labs  January 21, 2014 0359 01/21/2014 0731 Jan 21, 2014 1159  GLUCAP 129* 91 128*    Scheduled Meds: . ARIPiprazole  2 mg Oral Daily  . benzonatate  100 mg Oral TID  . DULoxetine  60 mg Oral QPM  . feeding  supplement (ENSURE COMPLETE)  237 mL Oral TID WC  . fluticasone  2 spray Each Nare Daily  . hydrocortisone sod succinate (SOLU-CORTEF) inj  10 mg Intravenous BID WC  . imipenem-cilastatin  500 mg Intravenous Q8H  . insulin aspart  0-9 Units Subcutaneous 6 times per day  . levalbuterol  1.25 mg Nebulization TID  . multivitamin with minerals  1 tablet Oral Daily  . oxymetazoline  1 spray Each Nare BID  . potassium chloride  10 mEq Intravenous Q1 Hr x 6  . saccharomyces boulardii  250 mg Oral BID  . sodium chloride  10-40 mL Intracatheter Q12H  . sodium chloride  10-40 mL Intracatheter Q12H  . sodium chloride  3 mL Intravenous Q12H    Continuous Infusions: . sodium chloride Stopped (12/23/13 1547)  . Marland KitchenTPN (CLINIMIX-E) Adult 30 mL/hr at 12/23/13 1721   And  . fat emulsion 250 mL (12/23/13 1722)  . TPN (CLINIMIX) Adult without lytes     And  . fat  emulsion       Antonieta Iba, RD, LDN Clinical Inpatient Dietitian Pager:  [phone] Weekend and after hours pager:  [phone]

## 2013-12-27 NOTE — Progress Notes (Signed)
Report received from K. Cheek,RN. No change in assessment.Lacey [name] 

## 2013-12-27 DEATH — deceased

## 2014-01-01 LAB — VIRAL CULTURE VIRC

## 2014-01-05 LAB — AFB CULTURE WITH SMEAR (NOT AT ARMC): Acid Fast Smear: NONE SEEN

## 2014-02-27 ENCOUNTER — Encounter (HOSPITAL_COMMUNITY): Payer: Self-pay

## 2014-05-10 ENCOUNTER — Telehealth: Payer: Self-pay | Admitting: Hematology & Oncology

## 2014-05-10 NOTE — Telephone Encounter (Signed)
Faxed Medical Records via fax today to:  [facility] Ph: [phone] ext. [phone] Fx: [phone]  Medical  Records requested from 07/24/2012 - 2013-07-24  Note: Pt is deceased and the information states that Lacey [name] is the Executor Her p: [phone]     CONSENT COPY SCANNED

## 2014-05-22 ENCOUNTER — Other Ambulatory Visit: Payer: Self-pay | Admitting: Pharmacist

## 2014-08-10 IMAGING — CT CT ABD-PELV W/O CM
1 of 2 series · 15 of 32 positions shown, 19 images · non-contrast
Comparison: CT ABD/PELV WO CM dated 12/02/2013; CT PELVIS W/O CM
dated 11/24/2013; CT ABD/PELVIS W CM dated 11/07/2013

CLINICAL DATA: Left hip and leg pain. Follow-up retroperitoneal
hematoma.

EXAM:
CT ABDOMEN AND PELVIS WITHOUT CONTRAST
TECHNIQUE: Multidetector CT imaging of the abdomen and pelvis was performed
following the standard protocol without intravenous contrast.

[Series 2: rtn ap without · axial · non-contrast · 0.69mm/px · z∈[-380,-15]mm · 15 of 81 slices shown, 19 images]
[im 4/81  soft-tissue]
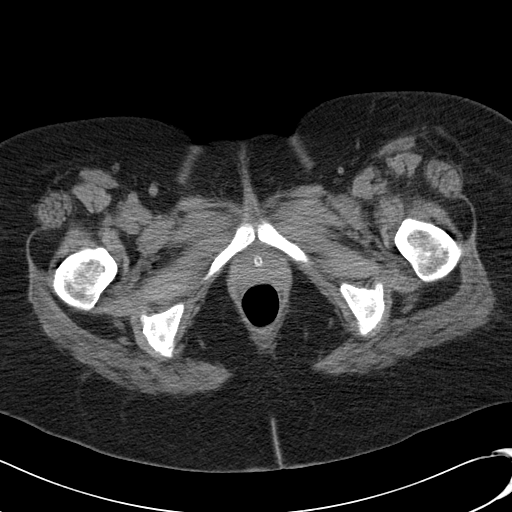
[im 4/81  bone]
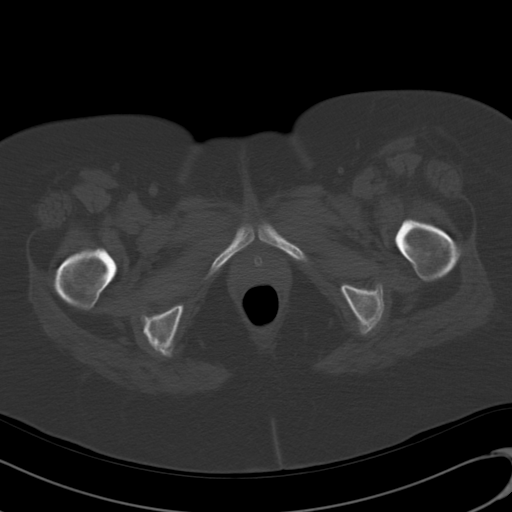
[im 11/81  soft-tissue]
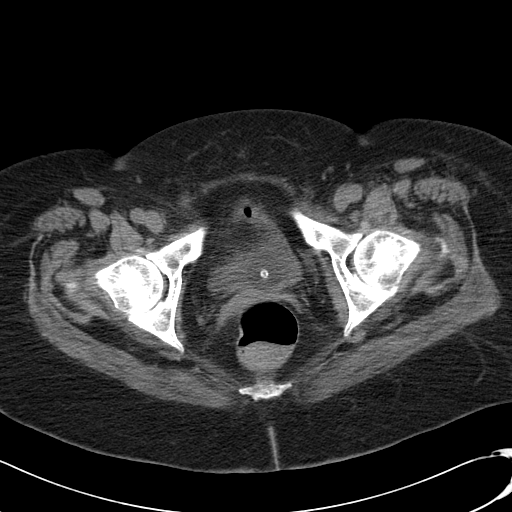
[im 17/81  soft-tissue]
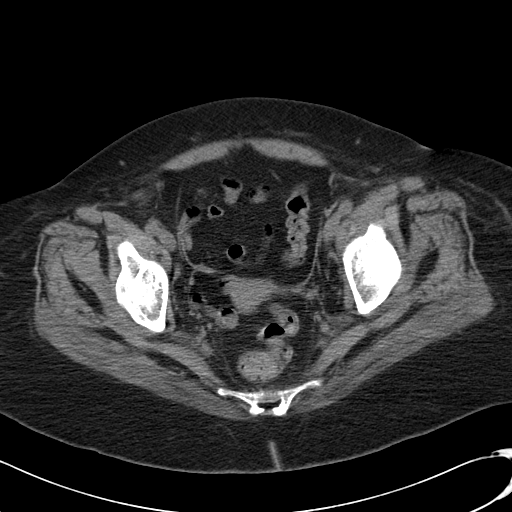
[im 24/81  soft-tissue]
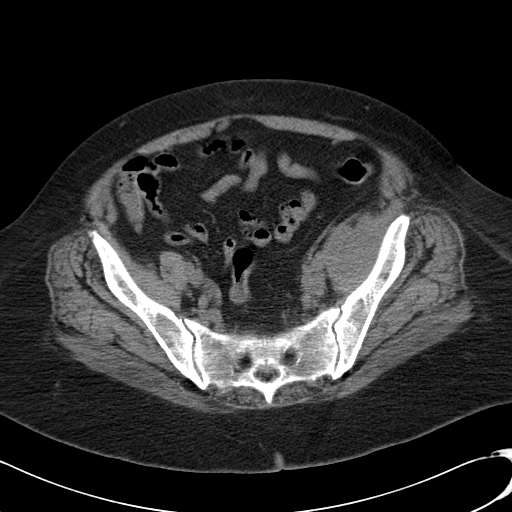
[im 27/81  soft-tissue]
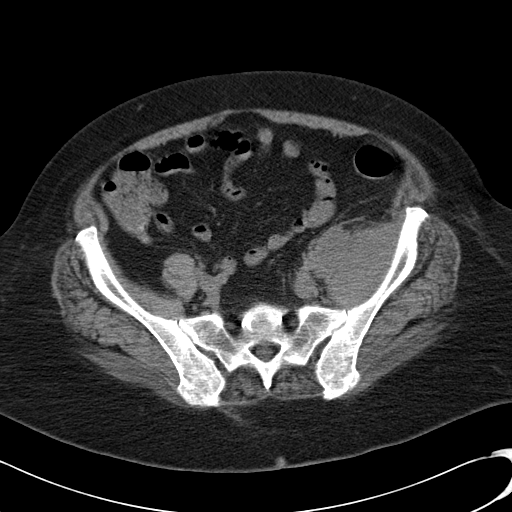
[im 34/81  soft-tissue]
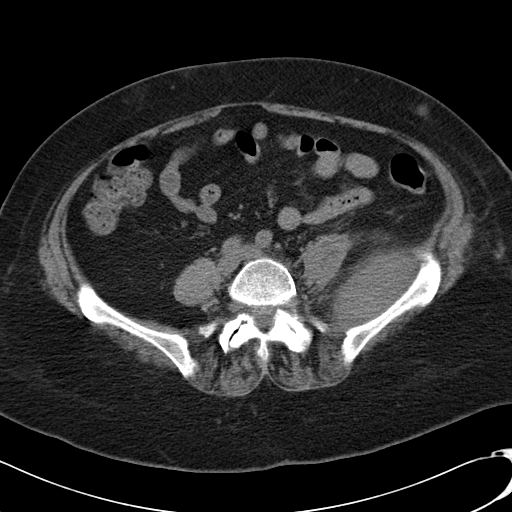
[im 41/81  soft-tissue]
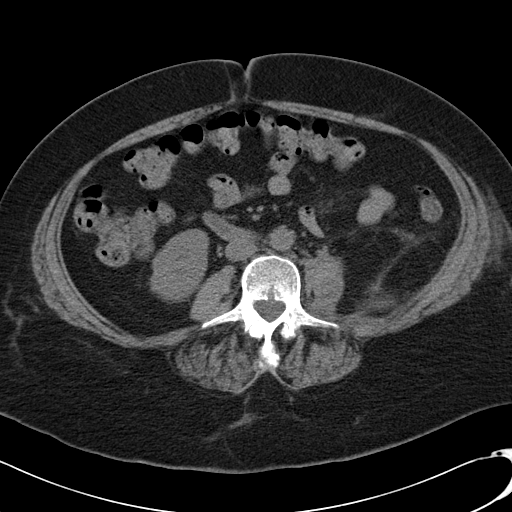
[im 47/81  soft-tissue]
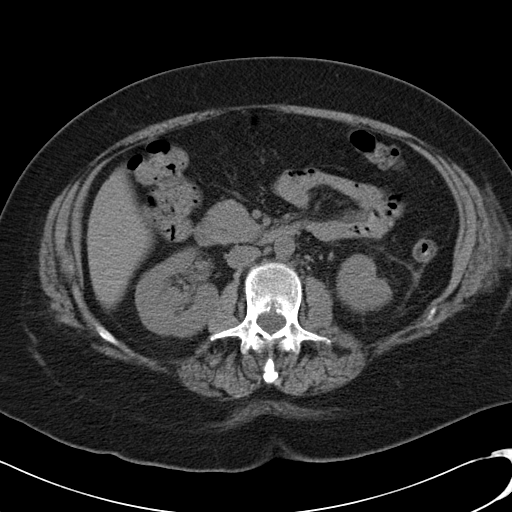
[im 54/81  soft-tissue]
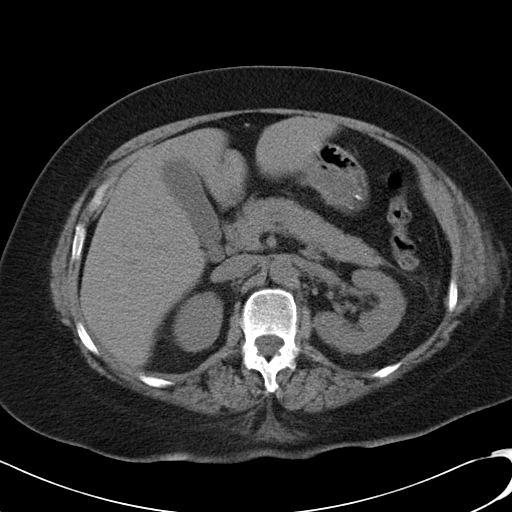
[im 54/81  bone]
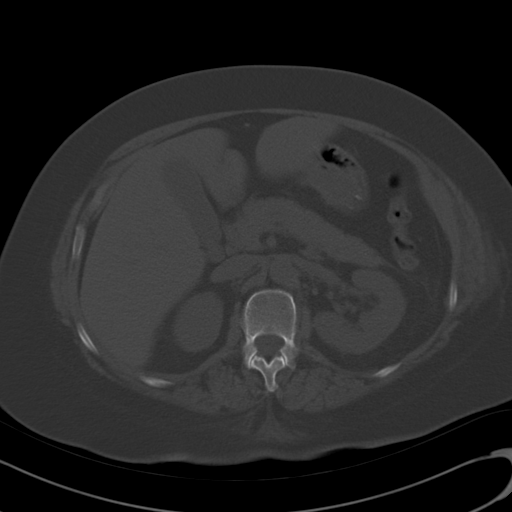
[im 57/81  soft-tissue]
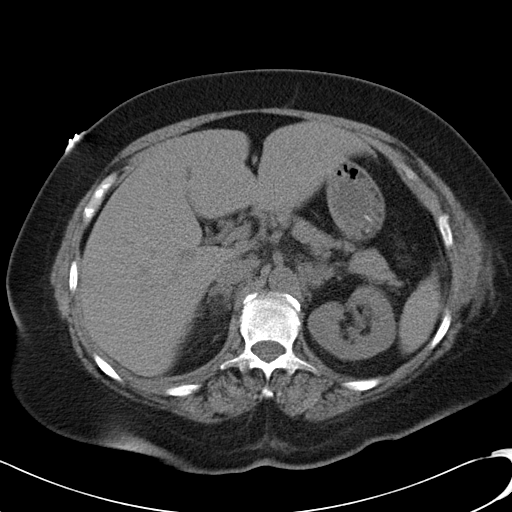
[im 64/81  soft-tissue]
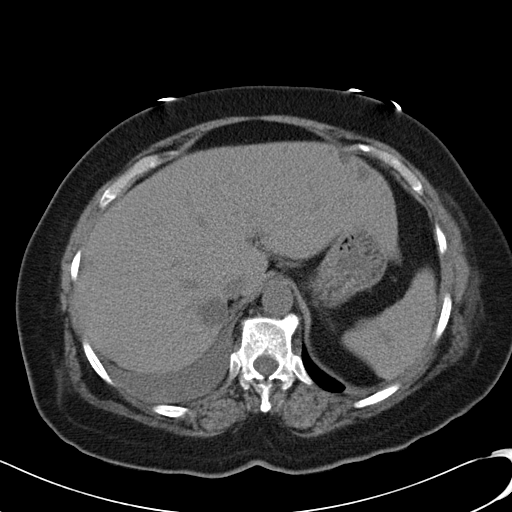
[im 67/81  lung]
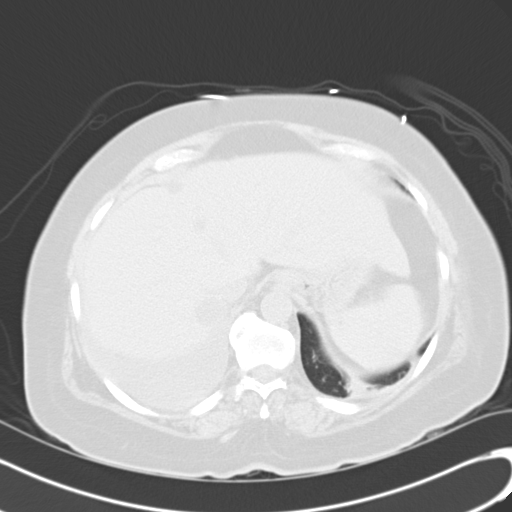
[im 71/81  soft-tissue]
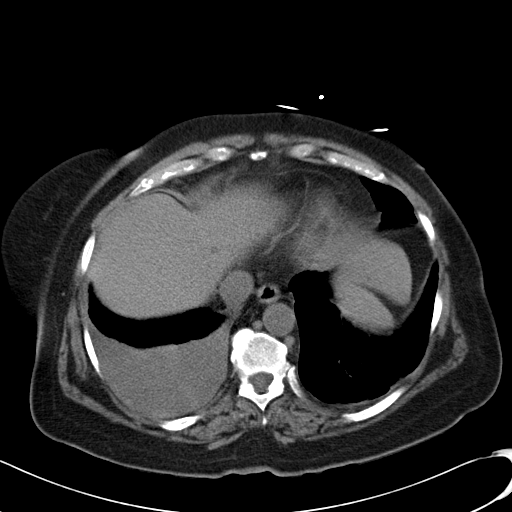
[im 71/81  lung]
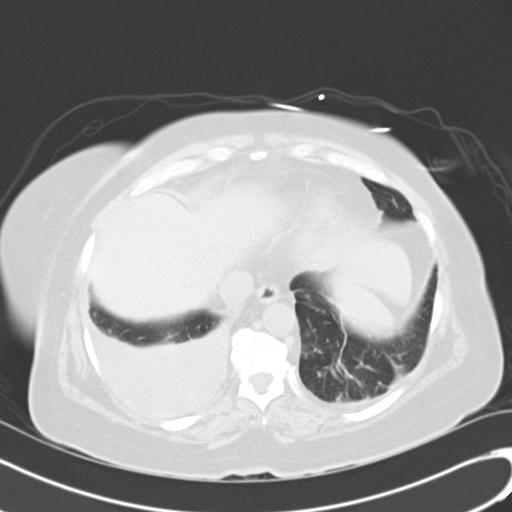
[im 74/81  lung]
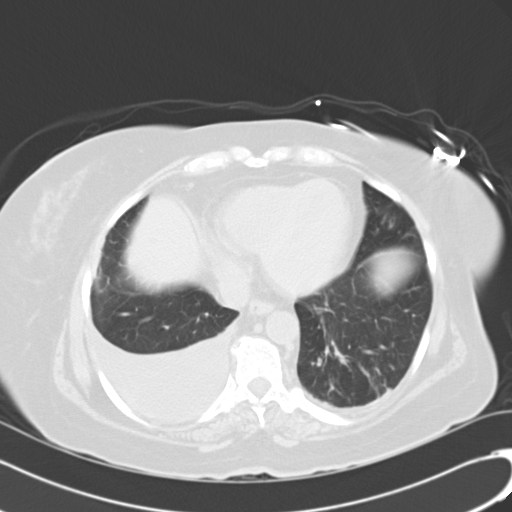
[im 77/81  soft-tissue]
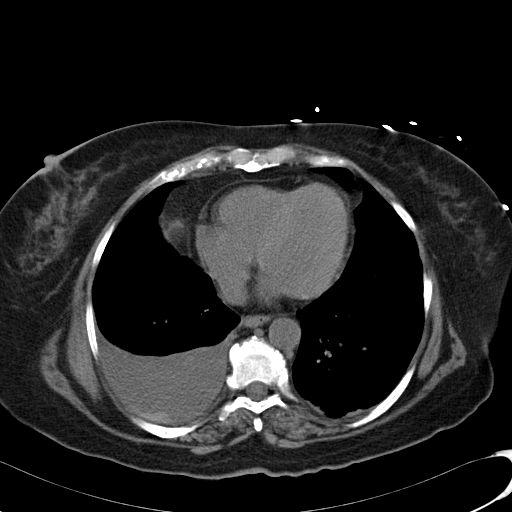
[im 77/81  lung]
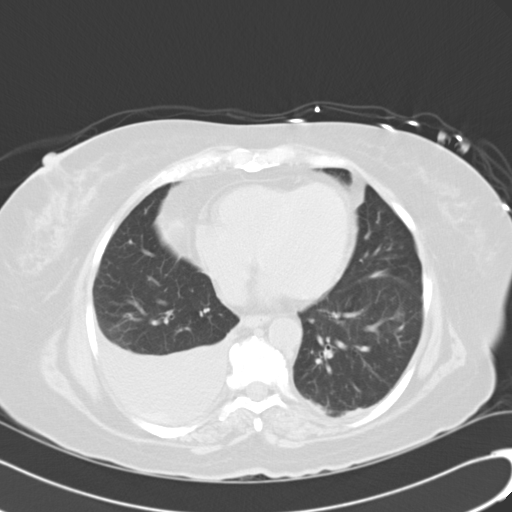

[15 of 32 positions shown; findings below may reference images not displayed]

FINDINGS: Moderate-sized dependent right pleural effusion is stable in size.
There is dependent high-density within the right pleural space.
There is interval improved aeration of the lung bases. No
significant left pleural or pericardial effusion is present.

Multiple hepatic cysts are again demonstrated, not significantly
changed. The gallbladder, pancreas and spleen appear unremarkable.
There is low-density enlargement of both adrenal glands, left
greater than right, similar to the most recent examination. The
adrenal enlargement has progressed since last month and likely
represents subacute hemorrhage. Renal cortical scarring is present
bilaterally. There is no hydronephrosis.

The left iliacus hematoma is unchanged in size. No new
intra-abdominal fluid collections are identified. There is stable
stranding along the lateral conal fascia. The bladder remains
decompressed by Foley catheter. The uterus and ovaries appear
unremarkable.

No fractures or worrisome osseous findings are demonstrated. There
is lower lumbar spine facet disease.
IMPRESSION: 1. Unchanged left iliacus hematoma.
2. Stable enlargement of both adrenal glands, new from last month
and consistent with subacute hemorrhage.
3. Stable mildly complex right pleural effusion, also new from last
month and probably posttraumatic in etiology.

## 2014-08-13 IMAGING — CR DG CHEST 2V
2 series · 2 of 2 positions shown · non-contrast
Comparison: CT ABD/PELV WO CM dated 12/11/2013; DG CHEST 1V PORT
dated 12/05/2013

CLINICAL DATA: Lymphoma, cough

EXAM:
CHEST  2 VIEW

[w chest lat]
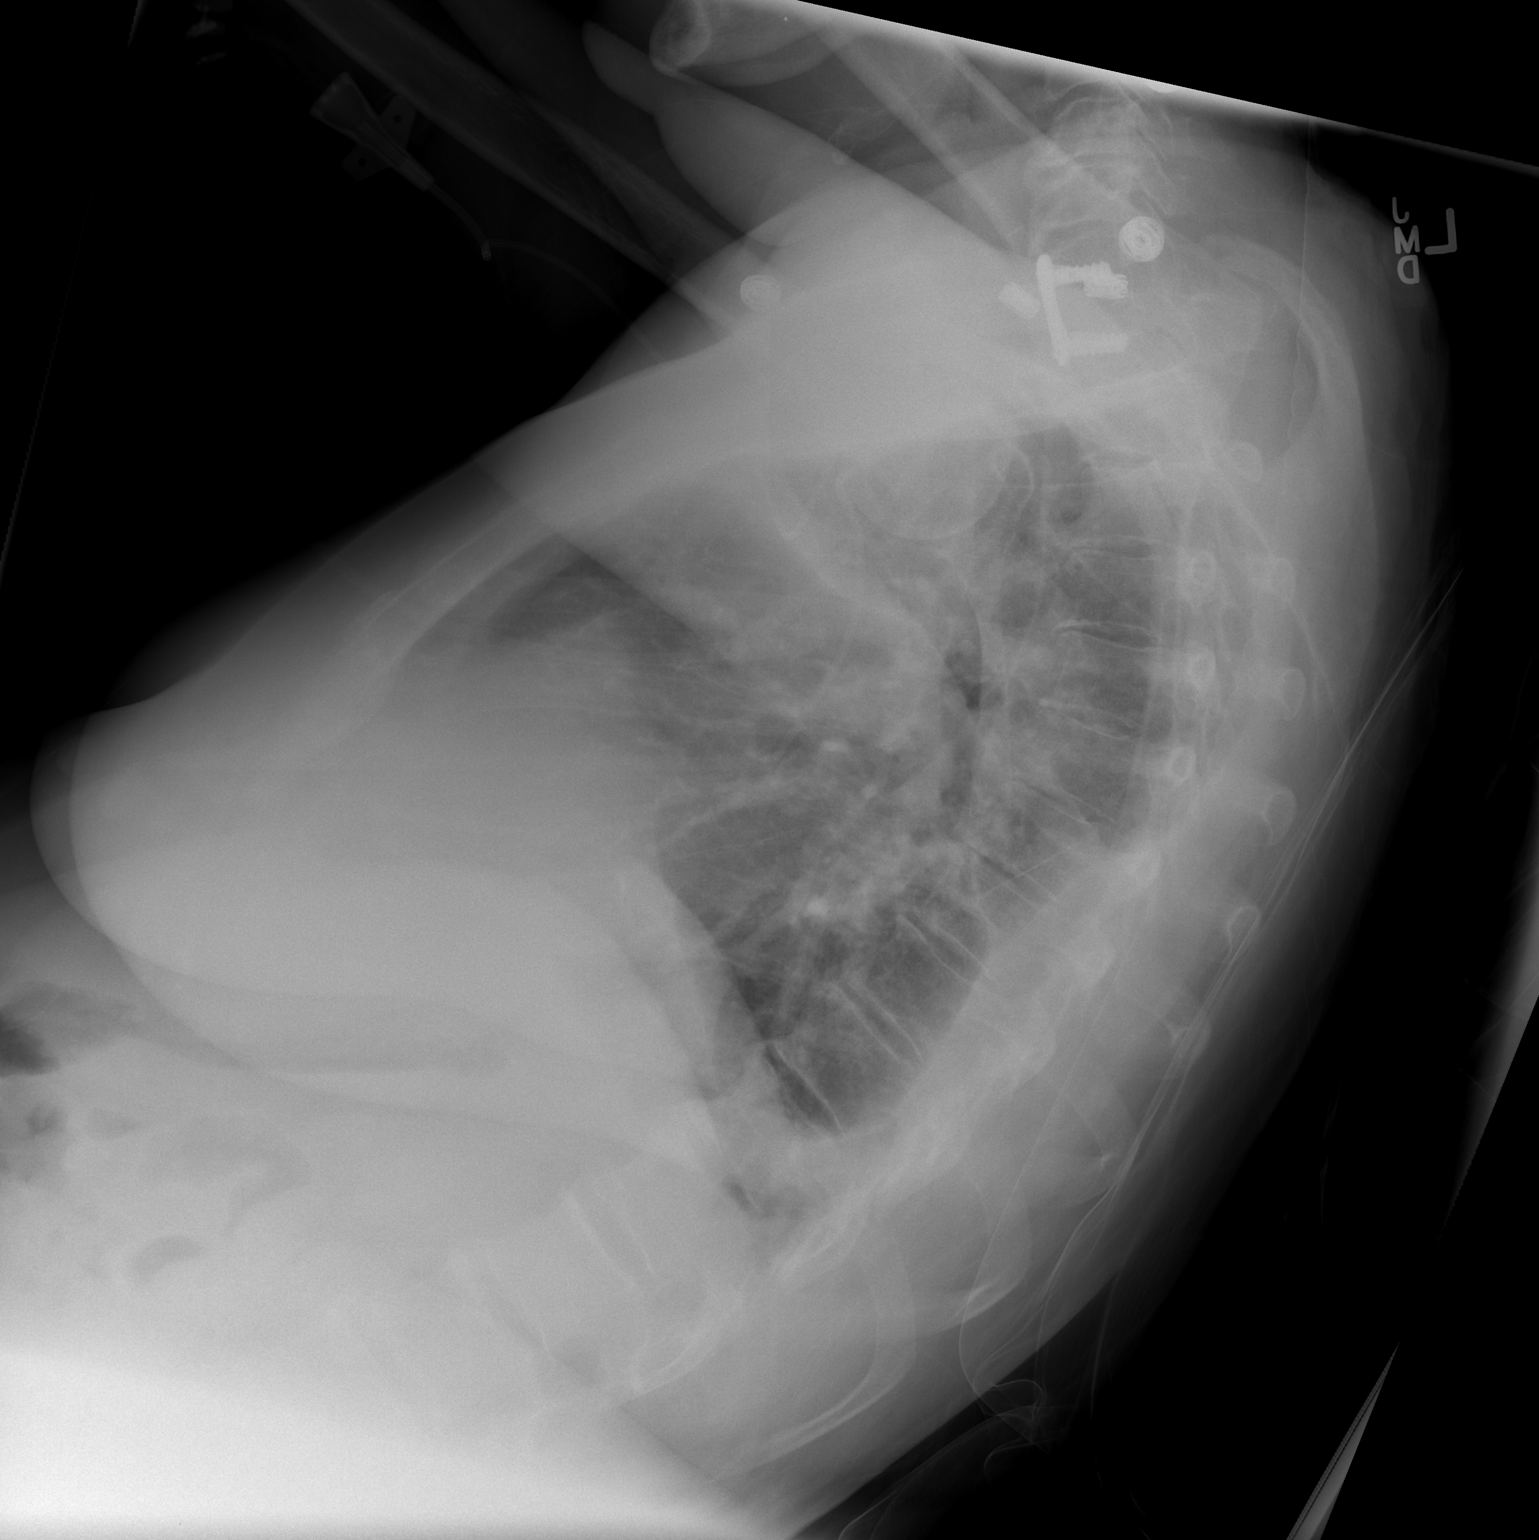

[view not recorded]
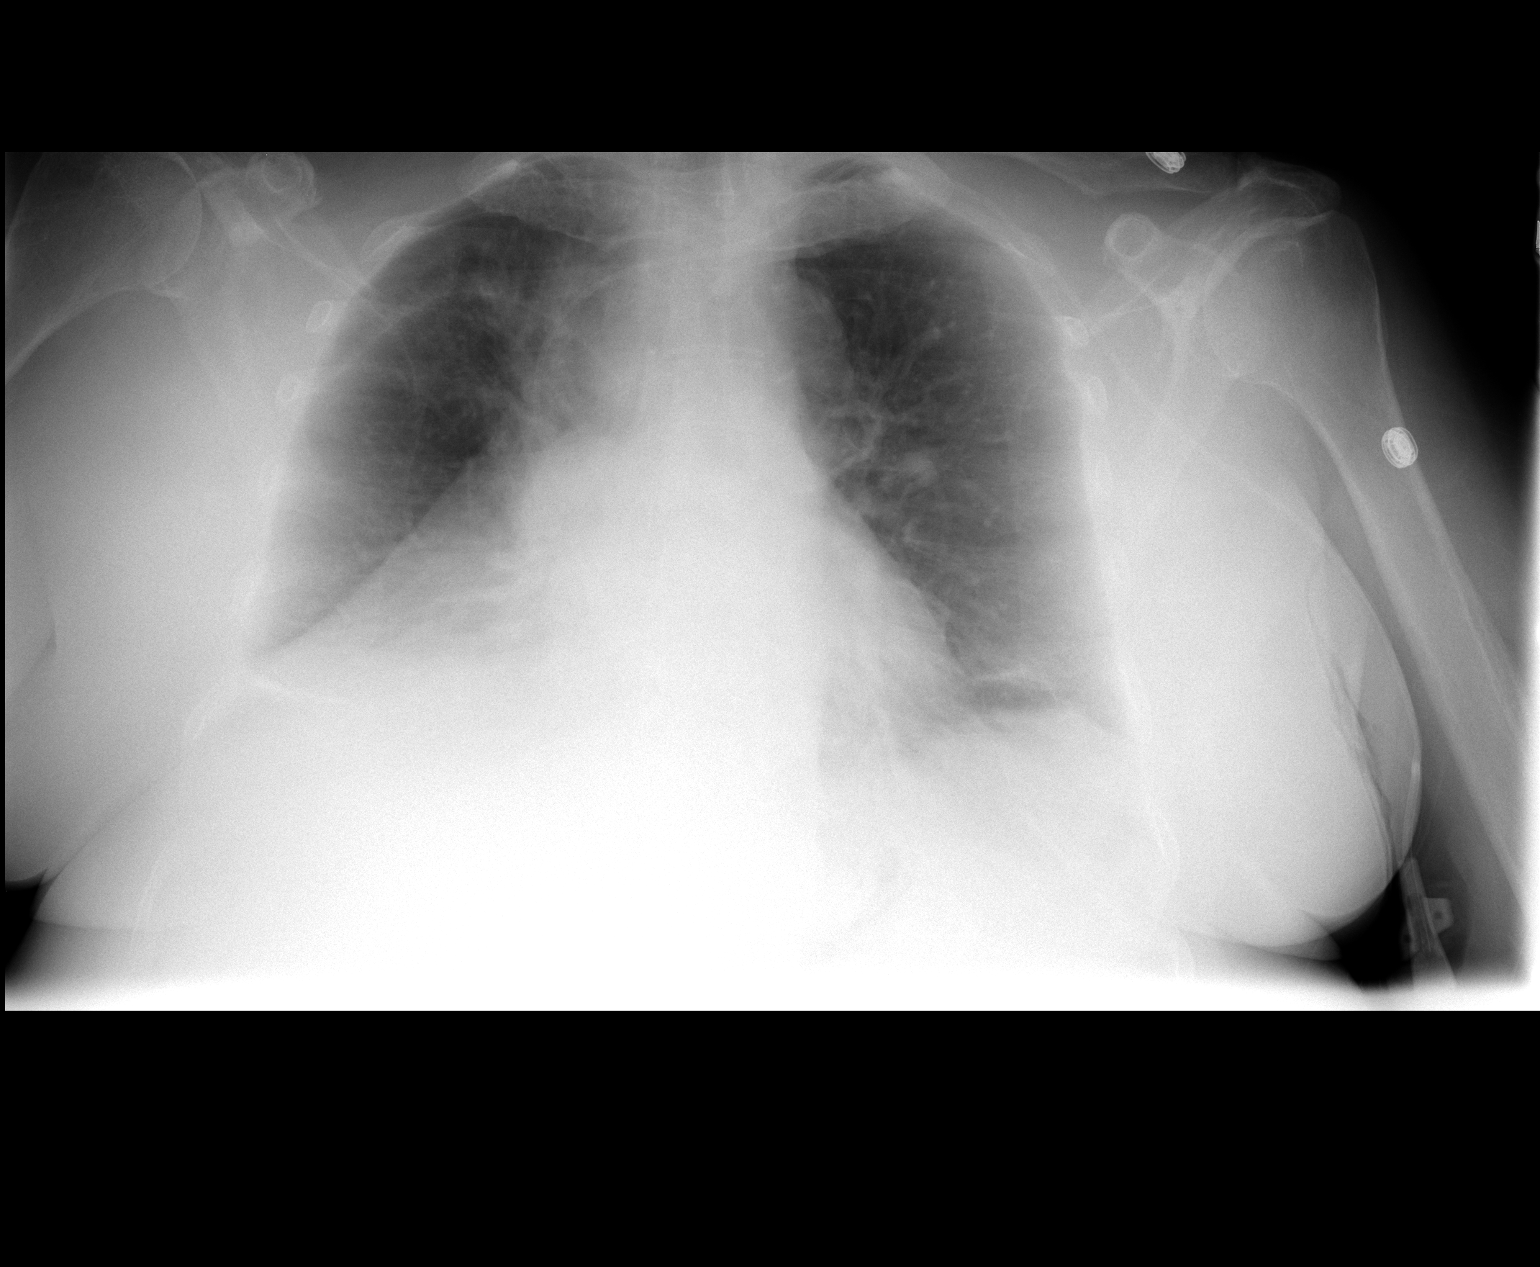

[2 of 2 positions shown; findings below may reference images not displayed]

FINDINGS: Stable enlarged cardiac silhouette. Small right effusion is again
noted. There is bibasilar mild atelectasis. No focal consolidation.
No pneumothorax.
IMPRESSION: Cardiomegaly and right effusion similar to comparison CT.
